# Patient Record
Sex: Male | Born: 1940 | Hispanic: Yes | Marital: Single | Smoking: Never smoker
Health system: Southern US, Community
[De-identification: ages and names within clinical notes are randomized; demographics above are authoritative.]

## PROBLEM LIST (undated history)

## (undated) DIAGNOSIS — N183 Chronic kidney disease, stage 3 unspecified: Secondary | ICD-10-CM

## (undated) DIAGNOSIS — E119 Type 2 diabetes mellitus without complications: Secondary | ICD-10-CM

## (undated) DIAGNOSIS — E785 Hyperlipidemia, unspecified: Secondary | ICD-10-CM

## (undated) DIAGNOSIS — I447 Left bundle-branch block, unspecified: Secondary | ICD-10-CM

## (undated) DIAGNOSIS — I10 Essential (primary) hypertension: Secondary | ICD-10-CM

## (undated) DIAGNOSIS — I639 Cerebral infarction, unspecified: Secondary | ICD-10-CM

## (undated) HISTORY — DX: Type 2 diabetes mellitus without complications: E11.9

## (undated) HISTORY — DX: Cerebral infarction, unspecified: I63.9

---

## 2016-04-25 ENCOUNTER — Emergency Department (HOSPITAL_COMMUNITY): Payer: Medicare Other

## 2016-04-25 ENCOUNTER — Emergency Department (HOSPITAL_COMMUNITY)
Admission: EM | Admit: 2016-04-25 | Discharge: 2016-04-25 | Disposition: A | Discharge status: Home / Self Care | Payer: Medicare Other | Attending: Emergency Medicine | Admitting: Emergency Medicine

## 2016-04-25 ENCOUNTER — Encounter (HOSPITAL_COMMUNITY): Payer: Self-pay | Admitting: *Deleted

## 2016-04-25 DIAGNOSIS — E1165 Type 2 diabetes mellitus with hyperglycemia: Secondary | ICD-10-CM | POA: Diagnosis not present

## 2016-04-25 DIAGNOSIS — R109 Unspecified abdominal pain: Secondary | ICD-10-CM | POA: Diagnosis not present

## 2016-04-25 DIAGNOSIS — R5383 Other fatigue: Secondary | ICD-10-CM | POA: Diagnosis present

## 2016-04-25 DIAGNOSIS — I10 Essential (primary) hypertension: Secondary | ICD-10-CM | POA: Diagnosis not present

## 2016-04-25 DIAGNOSIS — E119 Type 2 diabetes mellitus without complications: Secondary | ICD-10-CM

## 2016-04-25 DIAGNOSIS — E860 Dehydration: Secondary | ICD-10-CM | POA: Diagnosis not present

## 2016-04-25 HISTORY — DX: Essential (primary) hypertension: I10

## 2016-04-25 HISTORY — DX: Hyperlipidemia, unspecified: E78.5

## 2016-04-25 LAB — BASIC METABOLIC PANEL
Anion gap: 13 (ref 5–15)
BUN: 28 mg/dL — ABNORMAL HIGH (ref 6–20)
CO2: 25 mmol/L (ref 22–32)
Calcium: 9.6 mg/dL (ref 8.9–10.3)
Chloride: 91 mmol/L — ABNORMAL LOW (ref 101–111)
Creatinine, Ser: 1.47 mg/dL — ABNORMAL HIGH (ref 0.61–1.24)
GFR calc Af Amer: 52 mL/min — ABNORMAL LOW (ref >60)
GFR calc non Af Amer: 45 mL/min — ABNORMAL LOW (ref >60)
Glucose, Bld: 523 mg/dL — ABNORMAL HIGH (ref 65–99)
Potassium: 4.7 mmol/L (ref 3.5–5.1)
Sodium: 129 mmol/L — ABNORMAL LOW (ref 135–145)

## 2016-04-25 LAB — CBC
HCT: 44.9 % (ref 39.0–52.0)
Hemoglobin: 15.8 g/dL (ref 13.0–17.0)
MCH: 29.6 pg (ref 26.0–34.0)
MCHC: 35.2 g/dL (ref 30.0–36.0)
MCV: 84.2 fL (ref 78.0–100.0)
Platelets: 310 10*3/uL (ref 150–400)
RBC: 5.33 MIL/uL (ref 4.22–5.81)
RDW: 11.7 % (ref 11.5–15.5)
WBC: 9.8 10*3/uL (ref 4.0–10.5)

## 2016-04-25 LAB — I-STAT TROPONIN, ED: Troponin i, poc: 0 ng/mL (ref 0.00–0.08)

## 2016-04-25 LAB — CBG MONITORING, ED
Glucose-Capillary: 409 mg/dL — ABNORMAL HIGH (ref 65–99)
Glucose-Capillary: 295 mg/dL — ABNORMAL HIGH (ref 65–99)

## 2016-04-25 LAB — LIPASE, BLOOD: Lipase: 43 U/L (ref 11–51)

## 2016-04-25 MED ORDER — METFORMIN HCL 1000 MG PO TABS: 1000.0000 mg | ORAL_TABLET | Freq: Two times a day (BID) | ORAL | Status: DC

## 2016-04-25 MED ORDER — INSULIN ASPART 100 UNIT/ML ~~LOC~~ SOLN
5.0000 [IU] | Freq: Once | SUBCUTANEOUS | Status: AC
  Administered 2016-04-25: 5 [IU] via INTRAVENOUS
  Filled 2016-04-25: qty 1

## 2016-04-25 MED ORDER — HYDROMORPHONE HCL 1 MG/ML IJ SOLN
0.5000 mg | Freq: Once | INTRAMUSCULAR | Status: AC
  Administered 2016-04-25: 0.5 mg via INTRAVENOUS
  Filled 2016-04-25: qty 1

## 2016-04-25 MED ORDER — SODIUM CHLORIDE 0.9 % IV BOLUS (SEPSIS)
2000.0000 mL | Freq: Once | INTRAVENOUS | Status: AC
  Administered 2016-04-25: 2000 mL via INTRAVENOUS

## 2016-04-25 NOTE — ED Notes (Signed)
Patient and family all verbalized understanding of discharge instructions and deny any further needs or questions at this time. VS stable, patient ambulatory with steady gait. Patient's daughter states she will get prescription filled in the morning and follow up with the [facility] for PCP. Patient assisted to ED entrance in wheelchair.

## 2016-04-25 NOTE — ED Notes (Signed)
CBG 295 

## 2016-04-25 NOTE — Discharge Instructions (Signed)
Diabetes y normas bsicas de atencin mdica (Diabetes and Standards of Medical Care) La diabetes es una enfermedad complicada. El equipo que trate su diabetes deber incluir un nutricionista, un enfermero, un educador para la diabetes, un oftalmlogo y ms. Para que todas las personas conozcan sobre su enfermedad y para que los pacientes tengan los cuidados que necesitan, se crearon las siguientes normas bsicas para un mejor control. A continuacin se indican los estudios, vacunas, medicamentos, educacin y planes que necesitar. Prueba de HbA1c Esta prueba muestra cmo ha sido controlada su glucosa en los ltimos 2 o 3 meses. Se utiliza para verificar si el plan de control de la diabetes debe ser ajustado.   Hgalos al menos 2 veces al ao si cumple los objetivos del [place].  Si le han cambiado el tratamiento o si no cumple con los objetivos del [place], debe hacerlo 4 veces al ao. Control de la presin arterial.  Hgalas en cada visita mdica de rutina. El objetivo es tener menos de 140/90 mm Hg en la mayora de las personas, pero 130/80 mm Hg en algunos casos. Consulte a su mdico acerca de su objetivo. Examen dental.  Concurra regularmente a las visitas de control con el dentista. Examen ocular.  Si le diagnosticaron diabetes tipo 1 siendo un nio, debe hacerse estudios al llegar a los 10 aos o ms y si ha sufrido de diabetes durante 3 a 5 aos. Se recomienda hacer anualmente los exmenes oculares despus de ese examen inicial.  Si le diagnosticaron diabetes tipo 1 siendo adulto, hgase un examen dentro de los 5 aos del diagnstico y luego una vez por ao.  Si le diagnosticaron diabetes tipo 2, hgase un estudio lo antes posible despus del diagnstico y luego una vez por ao. Examen de los pies  Se har una inspeccin visual en cada visita mdica de rutina. Estos controles observarn si hay cortes, lesiones u otros problemas en los pies.  Debe realizarse un examen  completo de los pies cada ao. Esto incluye revisar la estructura y la piel de los pies, y examinar los pulsos y la sensacin de los pies.  Diabetes tipo 1: La primera prueba se realiza 5 aos despus del diagnstico.  Diabetes tipo 2: La primera prueba se realiza en el momento del diagnstico.  Contrlese los pies todas las noches para ver si hay cortes, lesiones u otros problemas. Comunquese con su mdico si observa que no se curan. Estudio de la funcin renal (microalbmina en orina)  Debe realizarse una vez por ao.  Diabetes tipo 1: La primera prueba se realiza a los 5 aos despus del diagnstico.  Diabetes tipo2: La primera prueba se realiza en el momento del diagnstico.  La creatinina srica y el ndice de filtracin glomerular estimada (eGFR, por sus siglas en ingls) se realizan una vez por ao para informar el nivel de enfermedad renal crnica, si la hubiera. Perfil lipdico (colesterol, HDL, LDL, triglicridos).  La mayora de las personas lo hacen cada 5 aos.  En relacin al LDL, el objetivo es tener menos de 100 mg/dl. Si tiene alto riesgo, el objetivo es tener menos de 70 mg/dl.  En relacin al HDL, el objetivo es [employer] 40 y 25 mg/dl para los hombres y entre 86 y 57 mg/dl para las mujeres. Un nivel de colesterol HDL de 60 mg/dl o superior da una cierta proteccin contra la enfermedad cardaca.  En relacin a los triglicridos, el objetivo es tener menos de 150 mg/dl. Vacunas  Se recomienda  aplicar de forma anual la vacuna contra la gripe a todas las personas de 6 meses en adelante que tengan diabetes.  La vacuna contra la neumona (antineumoccica) est recomendada para todas las personas de 2 aos en adelante que tengan diabetes. Los adultos de 65 aos o ms pueden recibir la vacuna antineumoccica como una serie de dos inyecciones diferentes.  Se recomienda administrar la vacuna contra la hepatitis B en adultos poco despus de que hayan recibido el  diagnstico de diabetes.  La vacuna Tdap (contra el ttanos, la difteria y la tosferina) debe aplicarse de la siguiente manera:  Segn las pautas normales de vacunacin infantil en el caso de los nios.  Cada 10 aos en el caso de los adultos con diabetes. Educacin para el autocontrol de la diabetes  Recomendaciones al momento del diagnstico y los controles segn sea necesario. Plan de tratamiento  Su plan de tratamiento ser revisado en cada visita mdica.   Esta informacin no tiene Marine scientist el consejo del mdico. Asegrese de hacerle al mdico cualquier pregunta que tenga.   Document Released: 02/19/2010 Document Revised: 12/16/2014 Elsevier Interactive Patient Education 2016 [place] (Hyperglycemia) La hiperglucemia ocurre cuando la glucosa (azcar) en su sangre est demasiado elevada. Puede suceder por varias razones, pero a menudo ocurre en personas que no saben que tienen diabetes o no la controlan adecuadamente.  CAUSAS Tanto si tiene diabetes como si no, existen otras causas para la hiperglucemia. La hiperglucemia puede producirse cuando tiene diabetes, pero tambin puede presentarse en otras situaciones de las que podra no ser consciente, como por ejemplo: Diabetes  Si tiene diabetes y tiene problemas para controlar su glucosa en sangre, la hiperglucemia podra producirse debido a las siguientes razones:  No seguir Armed forces technical officer.  No tomar los medicamentos para la diabetes o tomarlos de forma inadecuada.  Realizar menos ejercicio del que normalmente hace.  Estar enfermo. Prediabetes  Esto no puede ignorarse. Antes de que la persona presente diabetes de tipo 2, casi siempre hay "prediabetes". Esto ocurre cuando su glucosa en sangre es mayor que lo normal, pero no lo suficiente como para diagnosticar diabetes. La investigacin ha demostrado que algunos daos al cuerpo de Barrister's clerk, en especial los del corazn y el sistema  circulatorio, podran haber ocurrido durante el periodo de prediabetes. Si controla la glucosa en sangre cuando tiene prediabetes, podr retardar o evitar que se desarrrolle la diabetes tipo 2. El estrs  Si tiene diabetes, deber hacer una dieta, tomar medicamentos orales o insulina para [place]. Sin embargo, Pension scheme manager que la glucosa en sangre es mayor que lo normal en el hospital tenga o no diabetes. Cientficamente se lo denomina "hiperglucemia por estrs". El estrs puede elevar su glucosa en sangre. Esto ocurre porque el organismo genera hormonas en los momentos de estrs. Si el estrs ha [employer] causa del alto nivel de glucosa en [place], [place] mdico podr Optometrist un seguimiento de [place] regular. Ninfa [place], podr asegurarse de que la hiperglucemia no empeora o progresa hacia diabetes. Esteroides  Los esteroides son medicamentos que actan en la infeccin que ataca al sistema inmunolgico para bloquear la inflamacin o la infeccin. Un efecto secundario puede ser el aumento de glucosa en [place]. Muchas personas pueden producir la suficiente insulina extra para este aumento, pero aquellos que no pueden, los esteroides pueden [employer] niveles sean an [place]. No es inusual que los tratamientos con esteorides "destapen" una diabetes que se est desarrollando. No siempre es posible  determinar si la hiperglucemia desaparecer una vez que se detenga el consumo de esteroides. A veces se realiza un anlisis de sangre especial denominado A1c para determinar si la glucosa en sangre se ha elevado antes de comenzar con el consumo de esteroides. SNTOMAS  Sed.  Necesidad frecuente de Garment/textile technologist.  [employer].  Visin borrosa.  Cansancio o fatiga.  Debilidad.  Somnolencia.  Hormigueo en el pie o pierna. DIAGNSTICO El diagnstico se realiza mediante el control de la glucosa en sangre de una o varias de las siguientes maneras:  Anlisis A1c. Es una sustancia qumica que se encuentra en la  [place].  Control de glucosa en sangre con tiras de prueba.  Resultados de laboratorio. TRATAMIENTO Primero, es importante conocer la causa de la hiperglucemia antes de tratarla. El tratamiento puede ser el siguiente, [place] pueden ser otros:  Educacin  Cambios o ajustes en los medicamentos.  Cambios o ajustes en el plan de alimentacin.  Tratamiento por enfermedades, infecciones, etc.  Control de glucosa en sangre ms frecuente.  Cambios en el plan de ejercicios.  Disminucin o interrupcin del consumo de esteroides.  Cambios en el estilo de vida. INSTRUCCIONES PARA EL CUIDADO DOMICILIARIO  Contrlese la glucosa en sangre, como se lo indicaron.  Haga ejercicios regularmente. El profesional que lo asiste le dar instrucciones relacionadas con el ejercicio fsico. La prediabetes que es consecuencia de situaciones de estrs, puede mejorar con la actividad fsica.  Consuma alimentos saludables y balanceados. Coma a menudo y de [place] regular, en momentos fijos. El profesional o el nutricionista le dar una dieta especial para controlar su ingestin de azcar.  Mantener su peso ideal es importante. Si lo necesita, perder un poco de peso, como 5  7 Kg. puede ser beneficioso para [employer] niveles de Museum/gallery exhibitions officer. SOLICITE ATENCIN MDICA SI:  Tiene preguntas relacionadas con los medicamentos, la actividad o la dieta.  Contina teniendo sntomas (como mucha sed, deseos intensos de Garment/textile technologist o aumento de peso) SOLICITE ATENCIN MDICA DE INMEDIATO SI:  Vomita o tiene diarrea.  Su respiracin huele frutal.  La frecuencia respiratoria es ms rpida o ms lenta.  Est somnoliento o incoherente.  Siente adormecimiento, hormigueos o Engineer, agricultural o en las manos.  Siente dolor en el pecho.  Sus sntomas empeoran aunque haya seguido las indicaciones de su mdico.  Tiene otras preguntas o preocupaciones.   Esta informacin no tiene Marine scientist el consejo del  mdico. Asegrese de hacerle al mdico cualquier pregunta que tenga.   Document Released: 11/25/2005 Document Revised: 02/17/2012 Elsevier Interactive Patient Education 2016 [place] evitar los problemas relacionados con la diabetes (How to Avoid Diabetes Problems) Usted puede hacer varias cosas para prevenir o disminuir los problemas relacionados con la diabetes. Seguir un plan para la diabetes y cuidarse usted mismo puede reducir el riesgo de complicaciones graves o potencialmente mortales. A continuacin, encontrar algunas cosas que puede hacer para prevenir los problemas de la diabetes. CONTROLE LA DIABETES Siga las instrucciones de su mdico, enfermera educadora en diabetes y nutricionista para Aeronautical engineer enfermedad. Le ensearn los fundamentos para el cuidado de la diabetes. Le ayudar con las preguntas que pueda tener. Aprenda acerca de la diabetes y a tomar decisiones saludables en materia de alimentacin y [place] fsica. Controle su nivel de glucosa en la sangre con regularidad. El Biomedical engineer a decidir con qu frecuencia debe revisar su nivel de glucosa en la sangre, en funcin de los objetivos de su tratamiento y  el xito en cumplirlos.  NO USE NICOTINA La nicotina y la diabetes son Ardelia Mems combinacin peligrosa. La nicotina aumenta el riesgo de problemas con la diabetes. Si deja de Audiological scientist, reducir el riesgo de infarto de miocardio, ictus, enfermedades del sistema nervioso y enfermedades renales. Pueden mejorar el colesterol y sus niveles de presin arterial. La circulacin sangunea mejorar tambin. No consuma ningn producto que contenga tabaco, incluidos cigarrillos, tabaco de Higher education careers adviser o cigarrillos electrnicos. Si necesita ayuda para dejar de fumar, hable con el mdico. MANTENGA SU PRESIN ARTERIAL BAJO CONTROL El mdico determinar cul debera ser su presin arterial en funcin de su edad, los medicamentos que [place], el tiempo que hace que tiene diabetes y  cualquier otra enfermedad que padezca. La presin arterial consiste en dos nmeros. En general, el objetivo es mantener el nmero de [place] (presin sistlica) en un valor mximo de 130 y el nmero de abajo (presin diastlica) en un valor mximo de 80. Si corresponde, el mdico recomendar un objetivo de presin arterial con valores ms bajos. La planificacin de comidas, los medicamentos y el ejercicio pueden ayudarle a Science writer sus objetivos. Asegrese de que el mdico le mida la presin arterial en cada visita. MANTENGA LOS NIVELES DE COLESTEROL BAJO CONTROL Los niveles normales de colesterol ayudan a prevenir enfermedades del corazn y el ictus. Estos son los [employer] de salud para las personas con diabetes. Mantener los niveles de colesterol bajo control tambin puede ayudar con el flujo sanguneo. Controle su nivel de colesterol por lo menos una vez al ao. Su mdico puede recetarle un medicamento llamado estatina. Las estatinas reducen Freight forwarder. Si no est tomando una estatina, pregntele a su mdico si debera tomarla. La planificacin de comidas, el ejercicio y los medicamentos pueden ayudar a Science writer sus objetivos relacionados con el nivel de colesterol.  Steen Y OCULARES ANUALES El mdico le dir la frecuencia con la que quiere controlarlo en funcin de su plan de tratamiento. Es importante que cumpla con estos controles para identificar rpidamente posibles problemas y puedan evitarse o tratarse las complicaciones.  En cada visita, su mdico debe pesarlo, medirle la presin arterial y Chief of Staff su control del nivel de glucosa.  La hemoglobina A1c debe controlarse:  Por lo [employer] al ao si usted est en el nivel adecuado.  Cada 3 meses si hay cambios en el tratamiento.  Si usted no est alcanzando sus objetivos.  Los lpidos de la sangre deben controlarse anualmente. Tambin hay que controlar anualmente la presencia de protenas  en la orina (microalbuminuria).  Si tiene diabetes tipo1, programe un examen de fondo de ojos en el perodo de 46aos a partir del diagnstico y despus una vez al ao. Si tiene diabetes tipo2, programe un examen de fondo de ojos cuando reciba el diagnstico y despus una vez al ao. Los exmenes posteriores deben hacerse cada 2 o 3 aos si uno o ms exmenes han sido normales. MANTNGASE AL DA CON LAS VACUNAS Se recomienda que se vacune contra la gripe todos los [place]. Adems, que se vacune contra la neumona (vacuna antineumoccica). Si es mayor de 68 aos y nunca se Control and instrumentation engineer la neumona, esta vacuna puede administrarse como una serie de dos vacunas por separado. Pregntele al mdico qu otras vacunas se pueden recomendar. CUIDE SUS PIES  La diabetes puede hacer que el flujo sanguneo (circulacin) en las piernas y los pies sea deficiente. Debido a esto, la piel se torna ms delgada, se rompe  con facilidad y se cura ms lentamente. Tambin puede sufrir un dao en los nervios de las piernas y los pies, lo que disminuye la sensibilidad. Es posible que no advierta las heridas ms pequeas que pueden conducir a infecciones graves. El cuidado de los pies es muy importante. Se har una inspeccin visual en cada visita mdica de rutina. Estos controles observarn si hay cortes, lesiones u otros problemas en los pies. Una vez por ao debe hacerse un examen ms intensivo. Este examen incluye la inspeccin visual y Ardelia Mems evaluacin de los pulsos del pie y la sensibilidad. Usted tambin debe hacer lo siguiente:  Examine sus pies todos los [place] para detectar cortes, ampollas, callos, uas encarnadas, y signos de infeccin, tales como enrojecimiento, hinchazn o pus.  Lave y seque bien los pies, [employer] dedos.  Evite sumergir sus pies regularmente en agua caliente.  Hidrate la piel seca con locin, evitando colocarla [employer] dedos.  Crtese las uas en lnea recta y lime los  bordes.  Evite los zapatos que no calzan bien o tienen reas que irritan la piel.  Evite ir descalzo o con calcetines solamente. Sus pies necesitan proteccin. [place] con diabetes mal controlada son ms propensas a Music therapist (periodontales). Estas infecciones hacen que la diabetes sea difcil de Chief Technology Officer. Las [employer], si se dejan sin tratamiento, pueden conducir a la prdida de dientes. [place] sus [employer] al da, use hilo dental y visite al dentista para los controles y limpieza cada 6 meses o 2 veces al ao. CONSULTE A SU MDICO SOBRE EL CONSUMO DE ASPIRINA Tomar aspirina a diario se recomienda para ayudar a prevenir la enfermedad cardiovascular en personas con y sin diabetes. Pregntele a su mdico si esto lo beneficiara y cul es la dosis [employer] recomienda. [place] cantidades moderadas de alcohol (menos de 1 bebida al da para mujeres adultas y menores de 2 bebidas al da para hombres adultos) tienen un mnimo efecto sobre la glucosa en la sangre si se ingiere con los alimentos. Es importante comer alimentos cuando se bebe alcohol para evitar la hipoglucemia. Las Personal assistant el alcohol si tienen un historial de consumo excesivo o dependencia, si es una mujer [place], y si tiene una enfermedad heptica, pancreatitis, neuropata avanzada, o hipertrigliceridemia grave. DISMINUYA EL NIVEL DE ESTRS Vivir con diabetes puede ser estresante. Cuando usted est bajo estrs, el nivel de glucosa en la sangre puede verse afectada de dos maneras:  Las hormonas del estrs pueden hacer que la glucosa en la sangre se eleve.  Probablemente no se cuid lo suficiente. Es Ardelia Mems buena idea estar al tanto del nivel de estrs y Field seismologist los cambios que sean necesarios para ayudar a Air cabin crew mejor las situaciones difciles. Los grupos de apoyo, Clinical biochemist planificada, un pasatiempo que le guste, la  [place], las relaciones saludables y Field seismologist ejercicio son factores que ayudan a reducir el nivel de Psychologist, forensic. Si sus esfuerzos no parecen [employer], pdale ayuda a su mdico o a Civil Service fast streamer de la salud mental capacitado.   Esta informacin no tiene Marine scientist el consejo del mdico. Asegrese de hacerle al mdico cualquier pregunta que tenga.   Document Released: 11/14/2011 Document Revised: 12/16/2014 Elsevier Interactive Patient Education [employer].

## 2016-04-25 NOTE — ED Notes (Signed)
Checked patient blood sugar it was 409 notified RN of blood sugar

## 2016-04-25 NOTE — ED Provider Notes (Signed)
CSN: [id number]     Arrival date & time 04/25/16  1555 History   First MD Initiated Contact with Patient 04/25/16 1751     Chief Complaint  Patient presents with  . Chest Pain     (Consider location/radiation/quality/duration/timing/severity/associated sxs/prior Treatment) HPI   Language barrier. Interpretor used. 75yM with many complaints. For past several weeks to months has had increasing fatigue. Has no energy. Mild vague abdominal pain. Mild nausea. No vomiting. Polyuria. Polydipsia. Blurred vision. Significant hyperglycemia noted. No previously diagnosed hx of diabetes.   Past Medical History  Diagnosis Date  . Hypertension   . Hyperlipemia    History reviewed. No pertinent past surgical history. No family history on file. Social History  Substance Use Topics  . Smoking status: Never Smoker   . Smokeless tobacco: None  . Alcohol Use: None    Review of Systems  All systems reviewed and negative, other than as noted in HPI.    Allergies  Review of patient's allergies indicates no known allergies.  Home Medications   Prior to Admission medications   Not on File   BP 99/75 mmHg  Pulse 84  Temp(Src) 98.2 F (36.8 C) (Oral)  Resp 16  SpO2 92% Physical Exam  Constitutional: He appears well-developed and well-nourished. No distress.  HENT:  Head: Normocephalic and atraumatic.  Eyes: Conjunctivae are normal. Right eye exhibits no discharge. Left eye exhibits no discharge.  Neck: Neck supple.  Cardiovascular: Normal rate, regular rhythm and normal heart sounds.  Exam reveals no gallop and no friction rub.   No murmur heard. Pulmonary/Chest: Effort normal and breath sounds normal. No respiratory distress.  Abdominal: Soft. He exhibits no distension. There is no tenderness.  Musculoskeletal: He exhibits no edema or tenderness.  Neurological: He is alert.  Skin: Skin is warm and dry.  Psychiatric: He has a normal mood and affect. His behavior is normal. Thought  content normal.  Nursing note and vitals reviewed.   ED Course  Procedures (including critical care time) Labs Review Labs Reviewed  BASIC METABOLIC PANEL - Abnormal; Notable for the following:    Sodium 129 (*)    Chloride 91 (*)    Glucose, Bld 523 (*)    BUN 28 (*)    Creatinine, Ser 1.47 (*)    GFR calc non Af Amer 45 (*)    GFR calc Af Amer 52 (*)    All other components within normal limits  CBG MONITORING, ED - Abnormal; Notable for the following:    Glucose-Capillary 409 (*)    All other components within normal limits  CBC  LIPASE, BLOOD  I-STAT TROPOININ, ED    Imaging Review Dg Chest 2 View  04/25/2016  CLINICAL DATA:  74 year old male with chest pain abdominal pain headache shortness of breath nausea and diarrhea for 4 days. Initial encounter. EXAM: CHEST  2 VIEW COMPARISON:  None. FINDINGS: No pneumothorax or pneumoperitoneum. Mild cardiomegaly. Mildly tortuous thoracic aorta with calcified atherosclerosis. Other mediastinal contours are within normal limits. No pulmonary edema, pleural effusion or confluent pulmonary opacity. Normal visible bowel gas pattern in the upper abdomen. No acute osseous abnormality identified. IMPRESSION: Mild cardiomegaly. Calcified aortic atherosclerosis. No acute cardiopulmonary abnormality. Electronically Signed   By: Genevie Ann M.D.   On: 04/25/2016 17:17   I have personally reviewed and evaluated these images and lab results as part of my medical decision-making.   EKG Interpretation   Date/Time:  Thursday Apr 25 2016 16:01:39 EDT Ventricular Rate:  98  PR Interval:  146 QRS Duration: 84 QT Interval:  310 QTC Calculation: 395 R Axis:   16 Text Interpretation:  Normal sinus rhythm Right atrial enlargement T wave  abnormality, consider inferolateral ischemia No old tracing to compare  Confirmed by Reasnor  MD, [place] ([id number]) on 04/25/2016 6:10:54 PM      MDM   Final diagnoses:  Diabetes mellitus, new onset ([place])  Dehydration     75 year old male with multiple symptoms. Many likely related to hyperglycemia. Several week history of generalized fatigue. Intermittent nausea. Abdominal pain. Intermittent blurred vision. Polyuria. Polydipsia. He is hyperglycemic without acidosis or increased anion gap. Some renal impairment which is likely prerenal given his elevated BUN. He was given 2 L of IV fluids and a small dose of insulin. His glucose is in a more reasonable range. He will be discharged with a prescription for metformin. Briefly discussed diet and exercise. He needs to follow-up his primary physician as soon as he can. Return precautions discussed.  Virgel Manifold, MD 05/03/16 [phone]

## 2016-04-25 NOTE — ED Notes (Addendum)
Pt reports chest pain, nausea and diarrhea for 4 days. Pt was seen at an urgent care and had bloodwork drawn. Pt was called today and told to come to the emergency department. NAD noted in triage.

## 2016-04-26 NOTE — ED Notes (Signed)
0.5mg  of dilaudid wasted in [place] with Chaney Malling RN

## 2016-04-26 NOTE — ED Notes (Signed)
0.5mg  dilaudid wasted with Norva Pavlov RN in [place].

## 2019-12-19 ENCOUNTER — Emergency Department (HOSPITAL_COMMUNITY): Payer: Medicare Other

## 2019-12-19 ENCOUNTER — Encounter (HOSPITAL_COMMUNITY): Payer: Self-pay | Admitting: *Deleted

## 2019-12-19 ENCOUNTER — Other Ambulatory Visit: Payer: Self-pay

## 2019-12-19 ENCOUNTER — Inpatient Hospital Stay (HOSPITAL_COMMUNITY)
Admission: EM | Admit: 2019-12-19 | Discharge: 2019-12-22 | DRG: 065 | Disposition: A | Discharge status: Rehabilitation Facility | Payer: Medicare Other | Attending: Internal Medicine | Admitting: Internal Medicine

## 2019-12-19 DIAGNOSIS — N1831 Chronic kidney disease, stage 3a: Secondary | ICD-10-CM | POA: Diagnosis present

## 2019-12-19 DIAGNOSIS — K3184 Gastroparesis: Secondary | ICD-10-CM | POA: Diagnosis not present

## 2019-12-19 DIAGNOSIS — K760 Fatty (change of) liver, not elsewhere classified: Secondary | ICD-10-CM | POA: Diagnosis present

## 2019-12-19 DIAGNOSIS — R531 Weakness: Secondary | ICD-10-CM | POA: Diagnosis present

## 2019-12-19 DIAGNOSIS — I129 Hypertensive chronic kidney disease with stage 1 through stage 4 chronic kidney disease, or unspecified chronic kidney disease: Secondary | ICD-10-CM | POA: Diagnosis not present

## 2019-12-19 DIAGNOSIS — G9349 Other encephalopathy: Secondary | ICD-10-CM | POA: Diagnosis present

## 2019-12-19 DIAGNOSIS — E1165 Type 2 diabetes mellitus with hyperglycemia: Secondary | ICD-10-CM | POA: Diagnosis not present

## 2019-12-19 DIAGNOSIS — E785 Hyperlipidemia, unspecified: Secondary | ICD-10-CM | POA: Diagnosis present

## 2019-12-19 DIAGNOSIS — G8191 Hemiplegia, unspecified affecting right dominant side: Secondary | ICD-10-CM | POA: Diagnosis not present

## 2019-12-19 DIAGNOSIS — Z7984 Long term (current) use of oral hypoglycemic drugs: Secondary | ICD-10-CM

## 2019-12-19 DIAGNOSIS — Z23 Encounter for immunization: Secondary | ICD-10-CM

## 2019-12-19 DIAGNOSIS — Z8249 Family history of ischemic heart disease and other diseases of the circulatory system: Secondary | ICD-10-CM

## 2019-12-19 DIAGNOSIS — E1143 Type 2 diabetes mellitus with diabetic autonomic (poly)neuropathy: Secondary | ICD-10-CM | POA: Diagnosis present

## 2019-12-19 DIAGNOSIS — Z20822 Contact with and (suspected) exposure to covid-19: Secondary | ICD-10-CM | POA: Diagnosis not present

## 2019-12-19 DIAGNOSIS — I639 Cerebral infarction, unspecified: Secondary | ICD-10-CM | POA: Diagnosis not present

## 2019-12-19 DIAGNOSIS — R29703 NIHSS score 3: Secondary | ICD-10-CM | POA: Diagnosis not present

## 2019-12-19 DIAGNOSIS — Z792 Long term (current) use of antibiotics: Secondary | ICD-10-CM | POA: Diagnosis not present

## 2019-12-19 DIAGNOSIS — I447 Left bundle-branch block, unspecified: Secondary | ICD-10-CM | POA: Diagnosis not present

## 2019-12-19 DIAGNOSIS — R471 Dysarthria and anarthria: Secondary | ICD-10-CM | POA: Diagnosis present

## 2019-12-19 DIAGNOSIS — I10 Essential (primary) hypertension: Secondary | ICD-10-CM

## 2019-12-19 DIAGNOSIS — E1122 Type 2 diabetes mellitus with diabetic chronic kidney disease: Secondary | ICD-10-CM | POA: Diagnosis present

## 2019-12-19 DIAGNOSIS — E782 Mixed hyperlipidemia: Secondary | ICD-10-CM

## 2019-12-19 DIAGNOSIS — R29810 Facial weakness: Secondary | ICD-10-CM | POA: Diagnosis present

## 2019-12-19 DIAGNOSIS — Z79899 Other long term (current) drug therapy: Secondary | ICD-10-CM | POA: Diagnosis not present

## 2019-12-19 LAB — COMPREHENSIVE METABOLIC PANEL
ALT: 69 U/L — ABNORMAL HIGH (ref 0–44)
AST: 92 U/L — ABNORMAL HIGH (ref 15–41)
Albumin: 4.1 g/dL (ref 3.5–5.0)
Alkaline Phosphatase: 59 U/L (ref 38–126)
Anion gap: 15 (ref 5–15)
BUN: 26 mg/dL — ABNORMAL HIGH (ref 8–23)
CO2: 24 mmol/L (ref 22–32)
Calcium: 9.5 mg/dL (ref 8.9–10.3)
Chloride: 97 mmol/L — ABNORMAL LOW (ref 98–111)
Creatinine, Ser: 1.37 mg/dL — ABNORMAL HIGH (ref 0.61–1.24)
GFR calc Af Amer: 57 mL/min — ABNORMAL LOW (ref >60)
GFR calc non Af Amer: 49 mL/min — ABNORMAL LOW (ref >60)
Glucose, Bld: 158 mg/dL — ABNORMAL HIGH (ref 70–99)
Potassium: 3.5 mmol/L (ref 3.5–5.1)
Sodium: 136 mmol/L (ref 135–145)
Total Bilirubin: 0.8 mg/dL (ref 0.3–1.2)
Total Protein: 7.2 g/dL (ref 6.5–8.1)

## 2019-12-19 LAB — CBC WITH DIFFERENTIAL/PLATELET
Abs Immature Granulocytes: 0.06 10*3/uL (ref 0.00–0.07)
Basophils Absolute: 0.1 10*3/uL (ref 0.0–0.1)
Basophils Relative: 1 %
Eosinophils Absolute: 0.2 10*3/uL (ref 0.0–0.5)
Eosinophils Relative: 2 %
HCT: 44.9 % (ref 39.0–52.0)
Hemoglobin: 15.3 g/dL (ref 13.0–17.0)
Immature Granulocytes: 1 %
Lymphocytes Relative: 25 %
Lymphs Abs: 2.3 10*3/uL (ref 0.7–4.0)
MCH: 30.4 pg (ref 26.0–34.0)
MCHC: 34.1 g/dL (ref 30.0–36.0)
MCV: 89.1 fL (ref 80.0–100.0)
Monocytes Absolute: 0.7 10*3/uL (ref 0.1–1.0)
Monocytes Relative: 7 %
Neutro Abs: 6 10*3/uL (ref 1.7–7.7)
Neutrophils Relative %: 64 %
Platelets: 309 10*3/uL (ref 150–400)
RBC: 5.04 MIL/uL (ref 4.22–5.81)
RDW: 13 % (ref 11.5–15.5)
WBC: 9.3 10*3/uL (ref 4.0–10.5)
nRBC: 0 % (ref 0.0–0.2)

## 2019-12-19 LAB — TROPONIN I (HIGH SENSITIVITY)
Troponin I (High Sensitivity): 6 ng/L (ref <18)
Troponin I (High Sensitivity): 8 ng/L (ref <18)

## 2019-12-19 LAB — RESPIRATORY PANEL BY RT PCR (FLU A&B, COVID)
Influenza A by PCR: NEGATIVE
Influenza B by PCR: NEGATIVE
SARS Coronavirus 2 by RT PCR: NEGATIVE

## 2019-12-19 LAB — GLUCOSE, CAPILLARY: Glucose-Capillary: 137 mg/dL — ABNORMAL HIGH (ref 70–99)

## 2019-12-19 MED ORDER — SODIUM CHLORIDE 0.9 % IV SOLN: INTRAVENOUS | Status: DC

## 2019-12-19 MED ORDER — ASPIRIN EC 81 MG PO TBEC
81.0000 mg | DELAYED_RELEASE_TABLET | Freq: Every day | ORAL | Status: DC
  Administered 2019-12-20: 12:00:00 81 mg via ORAL
  Filled 2019-12-19: qty 1

## 2019-12-19 MED ORDER — ONDANSETRON HCL 4 MG PO TABS: 4.0000 mg | ORAL_TABLET | Freq: Four times a day (QID) | ORAL | Status: DC | PRN

## 2019-12-19 MED ORDER — ASPIRIN 81 MG PO CHEW
324.0000 mg | CHEWABLE_TABLET | Freq: Once | ORAL | Status: AC
  Administered 2019-12-19: 19:00:00 324 mg via ORAL
  Filled 2019-12-19: qty 4

## 2019-12-19 MED ORDER — POLYETHYLENE GLYCOL 3350 17 G PO PACK: 17.0000 g | PACK | Freq: Every day | ORAL | Status: DC | PRN

## 2019-12-19 MED ORDER — INSULIN ASPART 100 UNIT/ML ~~LOC~~ SOLN
0.0000 [IU] | Freq: Every day | SUBCUTANEOUS | Status: DC
  Administered 2019-12-21: 3 [IU] via SUBCUTANEOUS

## 2019-12-19 MED ORDER — IOHEXOL 9 MG/ML PO SOLN
ORAL | Status: AC
  Filled 2019-12-19: qty 1000

## 2019-12-19 MED ORDER — INSULIN ASPART 100 UNIT/ML ~~LOC~~ SOLN
0.0000 [IU] | Freq: Three times a day (TID) | SUBCUTANEOUS | Status: DC
  Administered 2019-12-20: 17:00:00 1 [IU] via SUBCUTANEOUS
  Administered 2019-12-20: 12:00:00 3 [IU] via SUBCUTANEOUS
  Administered 2019-12-20: 09:00:00 2 [IU] via SUBCUTANEOUS
  Administered 2019-12-21: 1 [IU] via SUBCUTANEOUS
  Administered 2019-12-21: 09:00:00 2 [IU] via SUBCUTANEOUS
  Administered 2019-12-21: 12:00:00 3 [IU] via SUBCUTANEOUS
  Administered 2019-12-22: 08:00:00 2 [IU] via SUBCUTANEOUS
  Administered 2019-12-22: 12:00:00 3 [IU] via SUBCUTANEOUS

## 2019-12-19 MED ORDER — ONDANSETRON HCL 4 MG/2ML IJ SOLN: 4.0000 mg | Freq: Four times a day (QID) | INTRAMUSCULAR | Status: DC | PRN

## 2019-12-19 MED ORDER — SODIUM CHLORIDE 0.9% FLUSH: 3.0000 mL | INTRAVENOUS | Status: DC | PRN

## 2019-12-19 MED ORDER — ACETAMINOPHEN 650 MG RE SUPP: 650.0000 mg | Freq: Four times a day (QID) | RECTAL | Status: DC | PRN

## 2019-12-19 MED ORDER — ATORVASTATIN CALCIUM 40 MG PO TABS
40.0000 mg | ORAL_TABLET | Freq: Every day | ORAL | Status: DC
  Administered 2019-12-19 – 2019-12-21 (×3): 40 mg via ORAL
  Filled 2019-12-19 (×4): qty 1

## 2019-12-19 MED ORDER — ACETAMINOPHEN 325 MG PO TABS
650.0000 mg | ORAL_TABLET | Freq: Four times a day (QID) | ORAL | Status: DC | PRN
  Administered 2019-12-19 – 2019-12-22 (×2): 650 mg via ORAL
  Filled 2019-12-19 (×2): qty 2

## 2019-12-19 MED ORDER — LABETALOL HCL 5 MG/ML IV SOLN: 10.0000 mg | INTRAVENOUS | Status: DC | PRN

## 2019-12-19 NOTE — Consult Note (Signed)
[facility] TeleNeurology Consult Services  Stat Consult  Date of Service:   12/19/2019 17:24:29  Impression:     .  R53.1 - Weakness  Comments/Sign-Out: 79yo Spanish speaking man w/PMH of DM, HLD p/w right arm/leg weakness on 12/19/19. Interpreter [phone]. He started having body aches, dizziness, nausea, weakness and trouble walking on 12/13/18. He also had right arm weakness since then. Dizziness and nausea have resolved but weakness remained so he came to the ED today. NIHSS 3 for right facial asymmetry, right leg drift, mild dysarthria. CT Head has no acute findings. Pt is not a candidate for alteplase or neuroIR due to being out of the 24 hour window. Presentation is concerning for acute ischemic stroke based on history and exam, stroke workup advised.  CT HEAD: Showed No Acute Hemorrhage or Acute Core Infarct  Metrics: [facility] Notification Time: 12/19/2019 17:22:26 Stamp Time: 12/19/2019 17:24:29 Callback Response Time: 12/19/2019 17:25:19  Our recommendations are outlined below.  Recommendations:     .  Initiate Aspirin 325 MG Daily     .  Telemetry     .  Normal Saline     .  SCDs for DVT prophylaxis     .  Optimize blood pressure, temp, glucose  Imaging Studies:     .  MRI Head Without Contrast     .  MRA Head and Neck Without Contrast When Available - Stroke Protocol     .  Carotid Dopplers     .  Echocardiogram - Transthoracic Echocardiogram  Therapies:     .  Physical Therapy, Occupational Therapy, Speech Therapy Assessment When Applicable  Other WorkUp:     .  Infectious/metabolic workup per primary team  Disposition: Neurology Follow Up Recommended  Sign Out:     .  Discussed with Emergency Department Provider  ----------------------------------------------------------------------------------------------------  Chief Complaint: right arm/leg weakness  History of Present Illness: Patient is a 79 year old Male.  79yo Spanish  speaking man w/PMH of DM, HLD p/w right arm/leg weakness on 12/19/19. Interpreter [phone]. He started having body aches, dizziness, nausea, weakness and trouble walking on 12/13/18. He also had right arm weakness since then. Dizziness and nausea have resolved but weakness remained so he came to the ED today.   Past Medical History:     . Diabetes Mellitus     . Hyperlipidemia     . There is NO history of Hypertension     . There is NO history of Atrial Fibrillation     . There is NO history of Coronary Artery Disease     . There is NO history of Stroke  Anticoagulant use:  No  Antiplatelet use: aspirin    Examination: BP(155/96), Pulse(100), Blood Glucose(158) 1A: Level of Consciousness - Alert; keenly responsive + 0 1B: Ask Month and Age - Both Questions Right + 0 1C: Blink Eyes & Squeeze Hands - Performs Both Tasks + 0 2: Test Horizontal Extraocular Movements - Normal + 0 3: Test Visual Fields - No Visual Loss + 0 4: Test Facial Palsy (Use Grimace if Obtunded) - Minor paralysis (flat nasolabial fold, smile asymmetry) + 1 5A: Test Left Arm Motor Drift - No Drift for 10 Seconds + 0 5B: Test Right Arm Motor Drift - No Drift for 10 Seconds + 0 6A: Test Left Leg Motor Drift - No Drift for 5 Seconds + 0 6B: Test Right Leg Motor Drift - Drift, but doesn't hit bed + 1 7: Test Limb Ataxia (FNF/Heel-Shin) -  No Ataxia + 0 8: Test Sensation - Normal; No sensory loss + 0 9: Test Language/Aphasia - Normal; No aphasia + 0 10: Test Dysarthria - Mild-Moderate Dysarthria: Slurring but can be understood + 1 11: Test Extinction/Inattention - No abnormality + 0  NIHSS Score: 3  Patient/Family was informed the Neurology Consult would happen via TeleHealth consult by way of interactive audio and video telecommunications and consented to receiving care in this manner.  Due to the immediate potential for life-threatening deterioration due to underlying acute neurologic illness, I spent 35 minutes  providing critical care. This time includes time for face to face visit via telemedicine, review of medical records, imaging studies and discussion of findings with providers, the patient and/or family.   Dr Lenard Galloway Classie Weng   [facility] [phone]   Case [id number]

## 2019-12-19 NOTE — H&P (Addendum)
History and Physical    Eugene Cooper [id number] DOB: 09-May-1941 DOA: 12/19/2019  PCP: Patient, No Pcp Per   Patient coming from: Home  I have personally briefly reviewed patient's old medical records in [place]  Chief Complaint: Right-sided weakness  HPI: Eugene Cooper is a 79 y.o. male with medical history significant for hypertension, dyslipidemia.  Patient is Spanish-speaking, history is obtained with the help of an tele- interpreter.  Patient presented to the ED with complaints of weakness with his right upper extremity and right lower extremity weakness with resultant inability to walk.  He also reports change in his voice, but is unable to confirm facial asymmetry.  He tells me symptoms started 2 to 3 days ago.he also reports he was having multiple episodes of vomiting that day that lasted just 1 day, and loose stools of 2 days duration.  Reported diffuse abdominal aches, generalized body aches, pain in his ankles also.   No specific complaints of chest pain, he is unable to tell me specifically if he has had any difficulty breathing.   After vomiting stopped, his abdominal pain has persisted to today, also today right-sided weakness hence he presented to the ED today. Denies current alcohol intake.  Denies Covid positive contacts.   ED Course: Blood pressure systolic Q000111Q to 123456, mild elevation in liver enzymes AST 92, ALT 69.  Head CT negative for acute intracranial abnormalities.  EKG showed sinus rhythm with LBBB.  Telemetry neurology was consulted-concern for acute ischemic stroke, full stroke work-up recommended.  Review of Systems: As per HPI all other systems reviewed and negative.  Past Medical History:  Diagnosis Date  . Hyperlipemia   . Hypertension     History reviewed. No pertinent surgical history.   reports that he has never smoked. He has never used smokeless tobacco. He reports previous alcohol use. He reports that he does not use  drugs.  No Known Allergies  Family history of hypertension.  Prior to Admission medications   Medication Sig Start Date End Date Taking? Authorizing Provider  lisinopril-hydrochlorothiazide (PRINZIDE,ZESTORETIC) 20-25 MG tablet Take 1 tablet by mouth daily. 03/10/16  Yes [provider]  metFORMIN (GLUCOPHAGE) 1000 MG tablet Take 1 tablet (1,000 mg total) by mouth 2 (two) times daily with a meal. 04/25/16  Yes Virgel Manifold, MD  pravastatin (PRAVACHOL) 40 MG tablet Take 40 mg by mouth at bedtime. 03/10/16  Yes [provider]  amoxicillin-clavulanate (AUGMENTIN) 875-125 MG tablet Take 1 tablet by mouth 2 (two) times daily. 04/19/16   [provider]    Physical Exam: Vitals:   12/19/19 1715 12/19/19 1730 12/19/19 1800 12/19/19 1830  BP:  (!) 154/96 138/87 (!) 138/97  Pulse:      Resp: 13     Temp:      SpO2:      Weight:      Height:        Constitutional: NAD, calm, comfortable Vitals:   12/19/19 1715 12/19/19 1730 12/19/19 1800 12/19/19 1830  BP:  (!) 154/96 138/87 (!) 138/97  Pulse:      Resp: 13     Temp:      SpO2:      Weight:      Height:       Eyes: PERRL, lids and conjunctivae normal ENMT: Mucous membranes are moist. Posterior pharynx clear of any exudate or lesions.  Neck: normal, supple, no masses, no thyromegaly Respiratory: clear to auscultation bilaterally, no wheezing, no crackles. Normal respiratory effort.  No accessory muscle use.  Cardiovascular: Regular rate and rhythm. No extremity edema. 2+ pedal pulses.  Abdomen: Moderate diffuse abdominal tenderness , no masses palpated. No hepatosplenomegaly. Bowel sounds positive.  Musculoskeletal: no clubbing / cyanosis. No joint deformity upper and lower extremities. Good ROM, no contractures. Normal muscle tone.  Skin: no rashes, lesions, ulcers. No induration Neurologic: Facial asymmetry present, with flattening of naso-labial fold on the right, right forehead not wrinkling, strength  right upper and lower extremity 4/5, left upper and lower extremity 5/5.  Sensation intact globally. Psychiatric: Normal judgment and insight. Alert and oriented x 3. Normal mood.   Labs on Admission: I have personally reviewed following labs and imaging studies  CBC: Recent Labs  Lab 12/19/19 1542  WBC 9.3  NEUTROABS 6.0  HGB 15.3  HCT 44.9  MCV 89.1  PLT Q000111Q   Basic Metabolic Panel: Recent Labs  Lab 12/19/19 1542  NA 136  K 3.5  CL 97*  CO2 24  GLUCOSE 158*  BUN 26*  CREATININE 1.37*  CALCIUM 9.5   Liver Function Tests: Recent Labs  Lab 12/19/19 1542  AST 92*  ALT 69*  ALKPHOS 59  BILITOT 0.8  PROT 7.2  ALBUMIN 4.1    Radiological Exams on Admission: CT Head Wo Contrast  Result Date: 12/19/2019 CLINICAL DATA:  Right-sided weakness EXAM: CT HEAD WITHOUT CONTRAST TECHNIQUE: Contiguous axial images were obtained from the base of the skull through the vertex without intravenous contrast. COMPARISON:  None. FINDINGS: Brain: There is no acute intracranial hemorrhage, mass-effect, or edema. Gray-white differentiation is preserved. There is no extra-axial fluid collection. Ventricles and sulci are within normal limits in size and configuration. Patchy hypoattenuation in the supratentorial white matter is nonspecific but may reflect mild chronic microvascular ischemic changes. Vascular: There is atherosclerotic calcification at the skull base. Skull: Calvarium is unremarkable. Sinuses/Orbits: Focal opacification of a left ethmoid air cell. Orbits are unremarkable. Other: Mastoid air cells are clear. IMPRESSION: No acute intracranial hemorrhage, mass effect, or evidence of acute infarction. Mild chronic microvascular ischemic changes. Electronically Signed   By: Macy Mis M.D.   On: 12/19/2019 16:09    EKG: Independently reviewed.  Sinus rhythm, QTC 493.  New left bundle branch block when compared to last EKG on file from 2017 EKG. no EKG in care  everywhere.  Assessment/Plan Active Problems:   Right sided weakness  Right-sided weakness- with right facial droop, reported dysarthria.  Head CT negative for acute abnormality.  History of hypertension.  Telemetry neurology consulted, concern for acute ischemic stroke.  Outside TPA window.  -MRI brain, MRA brain and neck -Aspirin 325 mg given, continue 81 mg daily -Atorvastatin 40 mg daily - Hgba1c and lipid panel in a.m. -Echocardiogram -Neurology consult -PT OT speech therapy evaluation - neurochecks  Abnormal EKG-ED showing new left bundle branch block, EKG 2017. No specific acute complaints of chest pain or difficulty breathing-so unsure of chronicity.  Hs Troponin 6.  - Trend Troponin -Repeat EKG in the morning. - Placed cardiology consult  -Follow-up echocardiogram - start aspirin, statins  Transaminitis- AST 92, ALT 69.  With normal ALP and total bilirubin.  History of alcohol abuse. -Acute hepatitis panel in a.m.  Abdominal pain-persistent with resolved vomiting and diarrhea.  Diffuse abdominal tenderness on exam, WBC 9.3. -Obtain CT abdomen and pelvis with contrast -With contrast exposure and considering CKD, will hydrate with N/s 100cc/hr x 10 hrs.  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 - Hydrate for contrast  exposure.  Hypertension-systolic Q000111Q to 123456. -Hold home lisinopril HCTZ for contrast exposure - PRN IV labetalol for systolic > 123XX123  Diabetes mellitus-random glucose 158 -Hold Metformin -Sliding scale insulin- S  Dyslipidemia - Lipid panel a.m - Atorv 40mg  daily  DVT prophylaxis: SCDs, pending size of stroke on MRI Code Status: Full Family Communication: None Disposition Plan: Per rounding team Consults called: neurology Admission status: Obs, Tele   Bethena Roys MD [facility]  12/19/2019, 9:29 PM

## 2019-12-19 NOTE — ED Provider Notes (Signed)
[facility] EMERGENCY DEPARTMENT Provider Note   CSN: [id number] Arrival date & time: 12/19/19  1424     History Chief Complaint  Patient presents with  . Weakness    right side weakness    F…Eugene Cooper is a 79 y.o. male.  Patient states that he threw up on Thursday and he has been having weakness in the right leg and right arm ever since then.  Patient has a history of hypertension hyperlipidemia  The history is provided by the patient. No language interpreter was used.  Weakness Severity:  Moderate Onset quality:  Sudden Timing:  Constant Progression:  Worsening Chronicity:  New Context: not alcohol use   Relieved by:  Nothing Worsened by:  Nothing Ineffective treatments:  None tried Associated symptoms: no abdominal pain, no chest pain, no cough, no diarrhea, no frequency, no headaches and no seizures        Past Medical History:  Diagnosis Date  . Hyperlipemia   . Hypertension     There are no problems to display for this patient.   History reviewed. No pertinent surgical history.     History reviewed. No pertinent family history.  Social History   Tobacco Use  . Smoking status: Never Smoker  . Smokeless tobacco: Never Used  Substance Use Topics  . Alcohol use: Not Currently  . Drug use: Never    Home Medications Prior to Admission medications   Medication Sig Start Date End Date Taking? Authorizing Provider  lisinopril-hydrochlorothiazide (PRINZIDE,ZESTORETIC) 20-25 MG tablet Take 1 tablet by mouth daily. 03/10/16  Yes [provider]  metFORMIN (GLUCOPHAGE) 1000 MG tablet Take 1 tablet (1,000 mg total) by mouth 2 (two) times daily with a meal. 04/25/16  Yes Virgel Manifold, MD  pravastatin (PRAVACHOL) 40 MG tablet Take 40 mg by mouth at bedtime. 03/10/16  Yes [provider]  amoxicillin-clavulanate (AUGMENTIN) 875-125 MG tablet Take 1 tablet by mouth 2 (two) times daily. 04/19/16   [provider]    Allergies      Patient has no known allergies.  Review of Systems   Review of Systems  Constitutional: Negative for appetite change and fatigue.  HENT: Negative for congestion, ear discharge and sinus pressure.   Eyes: Negative for discharge.  Respiratory: Negative for cough.   Cardiovascular: Negative for chest pain.  Gastrointestinal: Negative for abdominal pain and diarrhea.  Genitourinary: Negative for frequency and hematuria.  Musculoskeletal: Negative for back pain.  Skin: Negative for rash.  Neurological: Positive for weakness. Negative for seizures and headaches.  Psychiatric/Behavioral: Negative for hallucinations.    Physical Exam Updated Vital Signs BP (!) 155/96   Pulse 100   Temp 98.3 F (36.8 C)   Resp 16   Ht 5\' 4"  (1.626 m)   Wt 72.6 kg   SpO2 97%   BMI 27.46 kg/m   Physical Exam Vitals and nursing note reviewed.  Constitutional:      Appearance: He is well-developed.  HENT:     Head: Normocephalic.     Nose: Nose normal.  Eyes:     General: No scleral icterus.    Conjunctiva/sclera: Conjunctivae normal.  Neck:     Thyroid: No thyromegaly.  Cardiovascular:     Rate and Rhythm: Normal rate and regular rhythm.     Heart sounds: No murmur. No friction rub. No gallop.   Pulmonary:     Breath sounds: No stridor. No wheezing or rales.  Chest:     Chest wall: No tenderness.  Abdominal:  General: There is no distension.     Tenderness: There is no abdominal tenderness. There is no rebound.  Musculoskeletal:     Cervical back: Neck supple.     Comments: Mild weakness to right arm significant weakness to right leg  Lymphadenopathy:     Cervical: No cervical adenopathy.  Skin:    Findings: No erythema or rash.  Neurological:     Mental Status: He is alert and oriented to person, place, and time.     Motor: No abnormal muscle tone.     Coordination: Coordination normal.  Psychiatric:        Behavior: Behavior normal.     ED Results / Procedures /  Treatments   Labs (all labs ordered are listed, but only abnormal results are displayed) Labs Reviewed  COMPREHENSIVE METABOLIC PANEL - Abnormal; Notable for the following components:      Result Value   Chloride 97 (*)    Glucose, Bld 158 (*)    BUN 26 (*)    Creatinine, Ser 1.37 (*)    AST 92 (*)    ALT 69 (*)    GFR calc non Af Amer 49 (*)    GFR calc Af Amer 57 (*)    All other components within normal limits  RESPIRATORY PANEL BY RT PCR (FLU A&B, COVID)  CBC WITH DIFFERENTIAL/PLATELET    EKG EKG Interpretation  Date/Time:  Sunday December 19 2019 14:30:46 EST Ventricular Rate:  99 PR Interval:    QRS Duration: 149 QT Interval:  384 QTC Calculation: 493 R Axis:   69 Text Interpretation: Sinus tachycardia Atrial premature complex Left bundle branch block Confirmed by Fredia Sorrow [phone]) on 12/19/2019 2:34:52 PM   Radiology CT Head Wo Contrast  Result Date: 12/19/2019 CLINICAL DATA:  Right-sided weakness EXAM: CT HEAD WITHOUT CONTRAST TECHNIQUE: Contiguous axial images were obtained from the base of the skull through the vertex without intravenous contrast. COMPARISON:  None. FINDINGS: Brain: There is no acute intracranial hemorrhage, mass-effect, or edema. Gray-white differentiation is preserved. There is no extra-axial fluid collection. Ventricles and sulci are within normal limits in size and configuration. Patchy hypoattenuation in the supratentorial white matter is nonspecific but may reflect mild chronic microvascular ischemic changes. Vascular: There is atherosclerotic calcification at the skull base. Skull: Calvarium is unremarkable. Sinuses/Orbits: Focal opacification of a left ethmoid air cell. Orbits are unremarkable. Other: Mastoid air cells are clear. IMPRESSION: No acute intracranial hemorrhage, mass effect, or evidence of acute infarction. Mild chronic microvascular ischemic changes. Electronically Signed   By: Macy Mis M.D.   On: 12/19/2019 16:09     Procedures Procedures (including critical care time)  Medications Ordered in ED Medications - No data to display  ED Course  I have reviewed the triage vital signs and the nursing notes.  Pertinent labs & imaging results that were available during my care of the patient were reviewed by me and considered in my medical decision making (see chart for details). CRITICAL CARE Performed by: Milton Ferguson Total critical care time:60minutes Critical care time was exclusive of separately billable procedures and treating other patients. Critical care was necessary to treat or prevent imminent or life-threatening deterioration. Critical care was time spent personally by me on the following activities: development of treatment plan with patient and/or surrogate as well as nursing, discussions with consultants, evaluation of patient's response to treatment, examination of patient, obtaining history from patient or surrogate, ordering and performing treatments and interventions, ordering and review of laboratory studies,  ordering and review of radiographic studies, pulse oximetry and re-evaluation of patient's condition.    MDM Rules/Calculators/A&P                      CT head unremarkable.  I suspect the patient has had a stroke.  He is going to be admitted to medicine.  Tried hospitals would like to have a telemetry neuro consult.  Telemetry neuro will see the patient in the emergency department Final Clinical Impression(s) / ED Diagnoses Final diagnoses:  Weakness    Rx / DC Orders ED Discharge Orders    None       Milton Ferguson, MD 12/19/19 1744

## 2019-12-19 NOTE — ED Triage Notes (Signed)
Patient brought in by [facility] from home due to right side weakness with vomiting since Thursday.  Patient can move, but cannot walk.  Patient unable to take blood pressure medications due to nausea and vomiting.  Per EMS, blood pressure is elevated.

## 2019-12-20 ENCOUNTER — Observation Stay (HOSPITAL_COMMUNITY): Payer: Medicare Other

## 2019-12-20 ENCOUNTER — Inpatient Hospital Stay (HOSPITAL_COMMUNITY): Payer: Medicare Other

## 2019-12-20 DIAGNOSIS — N1831 Chronic kidney disease, stage 3a: Secondary | ICD-10-CM | POA: Diagnosis not present

## 2019-12-20 DIAGNOSIS — I639 Cerebral infarction, unspecified: Principal | ICD-10-CM

## 2019-12-20 DIAGNOSIS — R29810 Facial weakness: Secondary | ICD-10-CM | POA: Diagnosis not present

## 2019-12-20 DIAGNOSIS — E1122 Type 2 diabetes mellitus with diabetic chronic kidney disease: Secondary | ICD-10-CM | POA: Diagnosis not present

## 2019-12-20 DIAGNOSIS — I361 Nonrheumatic tricuspid (valve) insufficiency: Secondary | ICD-10-CM | POA: Diagnosis not present

## 2019-12-20 DIAGNOSIS — Z7984 Long term (current) use of oral hypoglycemic drugs: Secondary | ICD-10-CM | POA: Diagnosis not present

## 2019-12-20 DIAGNOSIS — Z79899 Other long term (current) drug therapy: Secondary | ICD-10-CM | POA: Diagnosis not present

## 2019-12-20 DIAGNOSIS — I10 Essential (primary) hypertension: Secondary | ICD-10-CM | POA: Diagnosis not present

## 2019-12-20 DIAGNOSIS — Z20822 Contact with and (suspected) exposure to covid-19: Secondary | ICD-10-CM | POA: Diagnosis not present

## 2019-12-20 DIAGNOSIS — R471 Dysarthria and anarthria: Secondary | ICD-10-CM | POA: Diagnosis not present

## 2019-12-20 DIAGNOSIS — N183 Chronic kidney disease, stage 3 unspecified: Secondary | ICD-10-CM | POA: Diagnosis not present

## 2019-12-20 DIAGNOSIS — E1165 Type 2 diabetes mellitus with hyperglycemia: Secondary | ICD-10-CM | POA: Diagnosis not present

## 2019-12-20 DIAGNOSIS — G9349 Other encephalopathy: Secondary | ICD-10-CM | POA: Diagnosis not present

## 2019-12-20 DIAGNOSIS — E785 Hyperlipidemia, unspecified: Secondary | ICD-10-CM | POA: Diagnosis not present

## 2019-12-20 DIAGNOSIS — Z8249 Family history of ischemic heart disease and other diseases of the circulatory system: Secondary | ICD-10-CM | POA: Diagnosis not present

## 2019-12-20 DIAGNOSIS — K760 Fatty (change of) liver, not elsewhere classified: Secondary | ICD-10-CM | POA: Diagnosis not present

## 2019-12-20 DIAGNOSIS — G8191 Hemiplegia, unspecified affecting right dominant side: Secondary | ICD-10-CM | POA: Diagnosis not present

## 2019-12-20 DIAGNOSIS — I340 Nonrheumatic mitral (valve) insufficiency: Secondary | ICD-10-CM | POA: Diagnosis not present

## 2019-12-20 DIAGNOSIS — I447 Left bundle-branch block, unspecified: Secondary | ICD-10-CM | POA: Diagnosis not present

## 2019-12-20 DIAGNOSIS — E1143 Type 2 diabetes mellitus with diabetic autonomic (poly)neuropathy: Secondary | ICD-10-CM | POA: Diagnosis not present

## 2019-12-20 DIAGNOSIS — I129 Hypertensive chronic kidney disease with stage 1 through stage 4 chronic kidney disease, or unspecified chronic kidney disease: Secondary | ICD-10-CM | POA: Diagnosis not present

## 2019-12-20 DIAGNOSIS — R531 Weakness: Secondary | ICD-10-CM | POA: Diagnosis present

## 2019-12-20 DIAGNOSIS — Z792 Long term (current) use of antibiotics: Secondary | ICD-10-CM | POA: Diagnosis not present

## 2019-12-20 DIAGNOSIS — Z23 Encounter for immunization: Secondary | ICD-10-CM | POA: Diagnosis not present

## 2019-12-20 DIAGNOSIS — R29703 NIHSS score 3: Secondary | ICD-10-CM | POA: Diagnosis not present

## 2019-12-20 DIAGNOSIS — K3184 Gastroparesis: Secondary | ICD-10-CM | POA: Diagnosis not present

## 2019-12-20 LAB — GLUCOSE, CAPILLARY
Glucose-Capillary: 151 mg/dL — ABNORMAL HIGH (ref 70–99)
Glucose-Capillary: 219 mg/dL — ABNORMAL HIGH (ref 70–99)
Glucose-Capillary: 147 mg/dL — ABNORMAL HIGH (ref 70–99)
Glucose-Capillary: 165 mg/dL — ABNORMAL HIGH (ref 70–99)

## 2019-12-20 LAB — HEPATITIS PANEL, ACUTE
HCV Ab: NONREACTIVE
Hep A IgM: NONREACTIVE
Hep B C IgM: NONREACTIVE
Hepatitis B Surface Ag: NONREACTIVE

## 2019-12-20 LAB — ECHOCARDIOGRAM COMPLETE
Height: 64 in
Weight: 2455.04 oz

## 2019-12-20 LAB — LIPID PANEL
Cholesterol: 258 mg/dL — ABNORMAL HIGH (ref 0–200)
HDL: 36 mg/dL — ABNORMAL LOW (ref >40)
LDL Cholesterol: 169 mg/dL — ABNORMAL HIGH (ref 0–99)
Total CHOL/HDL Ratio: 7.2 RATIO
Triglycerides: 267 mg/dL — ABNORMAL HIGH (ref <150)
VLDL: 53 mg/dL — ABNORMAL HIGH (ref 0–40)

## 2019-12-20 LAB — HEMOGLOBIN A1C
Hgb A1c MFr Bld: 8.2 % — ABNORMAL HIGH (ref 4.8–5.6)
Mean Plasma Glucose: 188.64 mg/dL

## 2019-12-20 MED ORDER — INFLUENZA VAC A&B SA ADJ QUAD 0.5 ML IM PRSY
0.5000 mL | PREFILLED_SYRINGE | INTRAMUSCULAR | Status: AC
  Administered 2019-12-21: 12:00:00 0.5 mL via INTRAMUSCULAR
  Filled 2019-12-20: qty 0.5

## 2019-12-20 MED ORDER — GADOBUTROL 1 MMOL/ML IV SOLN
8.0000 mL | Freq: Once | INTRAVENOUS | Status: AC | PRN
  Administered 2019-12-20: 11:00:00 8 mL via INTRAVENOUS

## 2019-12-20 MED ORDER — PNEUMOCOCCAL VAC POLYVALENT 25 MCG/0.5ML IJ INJ
0.5000 mL | INJECTION | INTRAMUSCULAR | Status: AC
  Administered 2019-12-21: 0.5 mL via INTRAMUSCULAR
  Filled 2019-12-20: qty 0.5

## 2019-12-20 MED ORDER — IOHEXOL 300 MG/ML  SOLN
100.0000 mL | Freq: Once | INTRAMUSCULAR | Status: AC | PRN
  Administered 2019-12-20: 01:00:00 100 mL via INTRAVENOUS

## 2019-12-20 MED ORDER — ASPIRIN EC 81 MG PO TBEC
162.0000 mg | DELAYED_RELEASE_TABLET | Freq: Every day | ORAL | Status: DC
  Administered 2019-12-21 – 2019-12-22 (×2): 162 mg via ORAL
  Filled 2019-12-20 (×2): qty 2

## 2019-12-20 NOTE — Progress Notes (Signed)
*  PRELIMINARY RESULTS* Echocardiogram 2D Echocardiogram has been performed.  Eugene Cooper 12/20/2019, 3:20 PM

## 2019-12-20 NOTE — Consult Note (Signed)
Tomball A. Merlene Laughter, MD     [email or web address]          Eugene Cooper is an 79 y.o. male.   ASSESSMENT/PLAN: 1. 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 This is a lacunar event.  Risk factors include hypertension, diabetes and dyslipidemia along with age. Aspirin 162 mg is recommended. Statin can be maximized to help improve the ongoing dyslipidemia. Diabetes also could be better controlled. Physical and occupational therapies will be needed.    Patient is 79 year old [place] male who presents with a  2-3 day history of a acute right hemiparesis and nausea and vomiting which led to the patient presenting to the hospital. It appears that he has had some dysarthria and possibly dysphagia. He the patient speaks very little [place] which makes the history difficult. He seems not to have other complaints however.    GENERAL:   This is a pleasant male who appears about 10 years younger than the stated age.  HEENT:   Neck is supple no trauma appreciated.  ABDOMEN: soft  EXTREMITIES: No edema   BACK:  Normal  SKIN: Normal by inspection.    MENTAL STATUS:  The patient is awake and alert and the follow commands with prompting given the severe language barrier. He does not appear to have significant dysarthria.  CRANIAL NERVES: Pupils are equal, round and reactive to light ; extra ocular movements are full, there is no significant nystagmus; visual fields are full; upper and lower facial muscles are normal in strength and symmetric, there is no flattening of the nasolabial folds; tongue is midline; uvula is midline; shoulder elevation is normal.  MOTOR:  The right upper extremity is graded as 3/5 in weakness. Tone is reduced. There is significant drift. The right lower extremities graded as 2/5.  The left side both upper lower extremities show normal tone, bulk and strength; no pronator  drift.  COORDINATION: Left finger to nose is normal, right finger to nose is normal, No rest tremor; no intention tremor; no postural tremor; no bradykinesia.  SENSATION: Normal to light touch   Blood pressure (P) 131/64, pulse (P) 68, temperature (P) 98.7 F (37.1 C), temperature source (P) Oral, resp. rate (P) 16, height 5\' 4"  (1.626 m), weight 69.6 kg, SpO2 97 %.  Past Medical History:  Diagnosis Date  . Hyperlipemia   . Hypertension     History reviewed. No pertinent surgical history.  History reviewed. No pertinent family history.  Social History:  reports that he has never smoked. He has never used smokeless tobacco. He reports previous alcohol use. He reports that he does not use drugs.  Allergies: No Known Allergies  Medications: Prior to Admission medications   Medication Sig Start Date End Date Taking? Authorizing Provider  lisinopril-hydrochlorothiazide (PRINZIDE,ZESTORETIC) 20-25 MG tablet Take 1 tablet by mouth daily. 03/10/16  Yes [provider]  metFORMIN (GLUCOPHAGE) 1000 MG tablet Take 1 tablet (1,000 mg total) by mouth 2 (two) times daily with a meal. 04/25/16  Yes Virgel Manifold, MD  pravastatin (PRAVACHOL) 40 MG tablet Take 40 mg by mouth at bedtime. 03/10/16  Yes [provider]  amoxicillin-clavulanate (AUGMENTIN) 875-125 MG tablet Take 1 tablet by mouth 2 (two) times daily. 04/19/16   [provider]    Scheduled Meds: . aspirin EC  81 mg Oral Daily  . atorvastatin  40 mg Oral q1800  . [START ON 12/21/2019] influenza vaccine adjuvanted  0.5 mL Intramuscular Tomorrow-1000  .  insulin aspart  0-5 Units Subcutaneous QHS  . insulin aspart  0-9 Units Subcutaneous TID WC  . [START ON 12/21/2019] pneumococcal 23 valent vaccine  0.5 mL Intramuscular Tomorrow-1000   Continuous Infusions: PRN Meds:.acetaminophen **OR** acetaminophen, labetalol, ondansetron **OR** ondansetron (ZOFRAN) IV, polyethylene glycol, sodium chloride  flush     Results for orders placed or performed during the hospital encounter of 12/19/19 (from the past 48 hour(s))  CBC with Differential     Status: None   Collection Time: 12/19/19  3:42 PM  Result Value Ref Range   WBC 9.3 4.0 - 10.5 K/uL   RBC 5.04 4.22 - 5.81 MIL/uL   Hemoglobin 15.3 13.0 - 17.0 g/dL   HCT 44.9 39.0 - 52.0 %   MCV 89.1 80.0 - 100.0 fL   MCH 30.4 26.0 - 34.0 pg   MCHC 34.1 30.0 - 36.0 g/dL   RDW 13.0 11.5 - 15.5 %   Platelets 309 150 - 400 K/uL   nRBC 0.0 0.0 - 0.2 %   Neutrophils Relative % 64 %   Neutro Abs 6.0 1.7 - 7.7 K/uL   Lymphocytes Relative 25 %   Lymphs Abs 2.3 0.7 - 4.0 K/uL   Monocytes Relative 7 %   Monocytes Absolute 0.7 0.1 - 1.0 K/uL   Eosinophils Relative 2 %   Eosinophils Absolute 0.2 0.0 - 0.5 K/uL   Basophils Relative 1 %   Basophils Absolute 0.1 0.0 - 0.1 K/uL   Immature Granulocytes 1 %   Abs Immature Granulocytes 0.06 0.00 - 0.07 K/uL    Comment: Performed at [facility], [address] [place], [place] [address]  Comprehensive metabolic panel     Status: Abnormal   Collection Time: 12/19/19  3:42 PM  Result Value Ref Range   Sodium 136 135 - 145 mmol/L   Potassium 3.5 3.5 - 5.1 mmol/L   Chloride 97 (L) 98 - 111 mmol/L   CO2 24 22 - 32 mmol/L   Glucose, Bld 158 (H) 70 - 99 mg/dL   BUN 26 (H) 8 - 23 mg/dL   Creatinine, Ser 1.37 (H) 0.61 - 1.24 mg/dL   Calcium 9.5 8.9 - 10.3 mg/dL   Total Protein 7.2 6.5 - 8.1 g/dL   Albumin 4.1 3.5 - 5.0 g/dL   AST 92 (H) 15 - 41 U/L   ALT 69 (H) 0 - 44 U/L   Alkaline Phosphatase 59 38 - 126 U/L   Total Bilirubin 0.8 0.3 - 1.2 mg/dL   GFR calc non Af Amer 49 (L) >60 mL/min   GFR calc Af Amer 57 (L) >60 mL/min   Anion gap 15 5 - 15    Comment: Performed at [facility], [address], [place], [place] [address]  Troponin I (High Sensitivity)     Status: None   Collection Time: 12/19/19  3:42 PM  Result Value Ref Range   Troponin I (High Sensitivity) 6 <18 ng/L    Comment:  (NOTE) Elevated high sensitivity troponin I (hsTnI) values and significant  changes across serial measurements may suggest ACS but many other  chronic and acute conditions are known to elevate hsTnI results.  Refer to the "Links" section for chest pain algorithms and additional  guidance. Performed at [facility], [address] [place], [place] [address]   Respiratory Panel by RT PCR (Flu A&B, Covid) - Nasopharyngeal Swab     Status: None   Collection Time: 12/19/19  5:18 PM   Specimen: Nasopharyngeal Swab  Result Value Ref Range   SARS Coronavirus 2 by RT PCR NEGATIVE NEGATIVE    Comment: (NOTE) SARS-CoV-2 target nucleic acids are NOT DETECTED. The SARS-CoV-2 RNA is generally detectable in upper respiratoy specimens during the acute phase of infection. The lowest concentration of SARS-CoV-2 viral copies this assay can detect is 131 copies/mL. A negative result does not preclude SARS-Cov-2 infection and should not be used as the sole basis for treatment or other patient management decisions. A negative result may occur with  improper specimen collection/handling, submission of specimen other than nasopharyngeal swab, presence of viral mutation(s) within the areas targeted by this assay, and inadequate number of viral copies (<131 copies/mL). A negative result must be combined with clinical observations, patient history, and epidemiological information. The expected result is Negative. Fact Sheet for Patients:  PinkCheek.be Fact Sheet for Healthcare Providers:  GravelBags.it This test is not yet ap proved or cleared by the [place] FDA and  has been authorized for detection and/or diagnosis of SARS-CoV-2 by FDA under an Emergency Use Authorization (EUA). This EUA will remain  in effect (meaning this test can be used) for the duration of the COVID-19 declaration under Section 564(b)(1) of the Act, 21 U.S.C. section  360bbb-3(b)(1), unless the authorization is terminated or revoked sooner.    Influenza A by PCR NEGATIVE NEGATIVE   Influenza B by PCR NEGATIVE NEGATIVE    Comment: (NOTE) The Xpert Xpress SARS-CoV-2/FLU/RSV assay is intended as an aid in  the diagnosis of influenza from Nasopharyngeal swab specimens and  should not be used as a sole basis for treatment. Nasal washings and  aspirates are unacceptable for Xpert Xpress SARS-CoV-2/FLU/RSV  testing. Fact Sheet for Patients: PinkCheek.be Fact Sheet for Healthcare Providers: GravelBags.it This test is not yet approved or cleared by the [place] FDA and  has been authorized for detection and/or diagnosis of SARS-CoV-2 by  FDA under an Emergency Use Authorization (EUA). This EUA will remain  in effect (meaning this test can be used) for the duration of the  Covid-19 declaration under Section 564(b)(1) of the Act, 21  U.S.C. section 360bbb-3(b)(1), unless the authorization is  terminated or revoked. Performed at [facility], [address] [place], [place] [address]   Glucose, capillary     Status: Abnormal   Collection Time: 12/19/19  9:19 PM  Result Value Ref Range   Glucose-Capillary 137 (H) 70 - 99 mg/dL   Comment 1 Notify RN    Comment 2 Document in Chart   Troponin I (High Sensitivity)     Status: None   Collection Time: 12/19/19  9:57 PM  Result Value Ref Range   Troponin I (High Sensitivity) 8 <18 ng/L    Comment: (NOTE) Elevated high sensitivity troponin I (hsTnI) values and significant  changes across serial measurements may suggest ACS but many other  chronic and acute conditions are known to elevate hsTnI results.  Refer to the "Links" section for chest pain algorithms and additional  guidance. Performed at [facility], [address], [place], [place] [address]   Hemoglobin A1c     Status: Abnormal   Collection Time: 12/20/19  6:39 AM  Result Value Ref  Range   Hgb A1c MFr Bld 8.2 (H) 4.8 - 5.6 %    Comment: (NOTE) Pre diabetes:          5.7%-6.4% Diabetes:              >6.4% Glycemic control for   <7.0% adults with diabetes  Mean Plasma Glucose 188.64 mg/dL    Comment: Performed at [facility] [address] [place], [place] [address]  Lipid panel     Status: Abnormal   Collection Time: 12/20/19  6:39 AM  Result Value Ref Range   Cholesterol 258 (H) 0 - 200 mg/dL   Triglycerides 267 (H) <150 mg/dL   HDL 36 (L) >40 mg/dL   Total CHOL/HDL Ratio 7.2 RATIO   VLDL 53 (H) 0 - 40 mg/dL   LDL Cholesterol 169 (H) 0 - 99 mg/dL    Comment:        Total Cholesterol/HDL:CHD Risk Coronary Heart Disease Risk Table                     Men   Women  1/2 Average Risk   3.4   3.3  Average Risk       5.0   4.4  2 X Average Risk   9.6   7.1  3 X Average Risk  23.4   11.0        Use the calculated Patient Ratio above and the CHD Risk Table to determine the patient's CHD Risk.        ATP III CLASSIFICATION (LDL):  <100     mg/dL   Optimal  100-129  mg/dL   Near or Above                    Optimal  130-159  mg/dL   Borderline  160-189  mg/dL   High  >190     mg/dL   Very High Performed at [facility], [address] [place], [place] [address]   Hepatitis panel, acute     Status: None   Collection Time: 12/20/19  6:39 AM  Result Value Ref Range   Hepatitis B Surface Ag NON REACTIVE NON REACTIVE   HCV Ab NON REACTIVE NON REACTIVE    Comment: (NOTE) Nonreactive HCV antibody screen is consistent with no HCV infections,  unless recent infection is suspected or other evidence exists to indicate HCV infection.    Hep A IgM NON REACTIVE NON REACTIVE   Hep B C IgM NON REACTIVE NON REACTIVE    Comment: Performed at [facility], [place] [address] [place], [place] [address]  Glucose, capillary     Status: Abnormal   Collection Time: 12/20/19  7:39 AM  Result Value Ref Range   Glucose-Capillary 151 (H) 70 - 99 mg/dL  Glucose,  capillary     Status: Abnormal   Collection Time: 12/20/19 11:42 AM  Result Value Ref Range   Glucose-Capillary 219 (H) 70 - 99 mg/dL  Glucose, capillary     Status: Abnormal   Collection Time: 12/20/19  4:18 PM  Result Value Ref Range   Glucose-Capillary 147 (H) 70 - 99 mg/dL    Studies/Results:  TTE  1. Left ventricular ejection fraction, by visual estimation, is 55 to 60%. The left ventricle has normal function. There is mildly increased left ventricular hypertrophy.  2. Left ventricular diastolic parameters are consistent with Grade I diastolic dysfunction (impaired relaxation).  3. The left ventricle has no regional wall motion abnormalities.  4. Global right ventricle has normal systolic function.The right ventricular size is normal. No increase in right ventricular wall thickness.  5. Left atrial size was normal.  6. Right atrial size was normal.  7. Presence of pericardial fat pad.  8. Mild mitral annular calcification.  9. The  mitral valve is grossly normal. No evidence of mitral valve regurgitation. 10. The tricuspid valve is grossly normal. 11. The aortic valve is tricuspid. Aortic valve regurgitation is not visualized. 12. The pulmonic valve was grossly normal. Pulmonic valve regurgitation is not visualized. 13. Aortic dilatation noted. 14. 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 15. Mildly elevated pulmonary artery systolic pressure. 16. The tricuspid regurgitant velocity is 2.61 m/s, and with an assumed right atrial pressure of 3 mmHg, the estimated right ventricular systolic pressure is mildly elevated at 30.2 mmHg. 17. The inferior vena cava is normal in size with greater than 50% respiratory variability, suggesting right atrial pressure of 3 mmHg.    BRAIN NECK MRA BRAIN MRI FINDINGS: MRI HEAD FINDINGS  Brain: There is reduced diffusion in the parasagittal left pons. No evidence of hemorrhage. Patchy T2 hyperintensity in the supratentorial white matter  is nonspecific but may reflect mild chronic microvascular ischemic changes. Ventricles and sulci are within normal limits in size and configuration. There is no intracranial mass, mass effect, hydrocephalus, or extra-axial fluid collection.  Vascular: Major vessel flow voids at the skull base are preserved.  Skull and upper cervical spine: Marrow signal is unremarkable.  Sinuses/Orbits: No acute abnormality.  Other: None.  MRA HEAD FINDINGS  Intracranial internal carotid arteries are patent. Middle and anterior cerebral arteries are patent. Intracranial vertebral arteries, basilar artery, posterior cerebral arteries are patent. A left posterior communicating artery is present. There is no significant stenosis or aneurysm.  MRA NECK FINDINGS  Common, internal, and external carotid arteries are patent. Extracranial vertebral arteries are patent. No hemodynamically significant stenosis or evidence of dissection.  IMPRESSION: Acute infarction of the parasagittal left pons.  Mild chronic microvascular ischemic changes.  No hemodynamically significant stenosis or evidence of dissection.     Brain MRI is reviewed in person shows acute infarct involving the left pontine area with increased signal on DWI and reduced signal on ADC scan. 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 No hemorrhages appreciated. MRA shows luminal irregularity and dropout signal involving the PCA on the left side. Otherwise no significant stenosis is noted.   Dosha Broshears A. Merlene Laughter, M.D.  Diplomate, Tax adviser of Psychiatry and Neurology ( Neurology). 12/20/2019, 5:34 PM

## 2019-12-20 NOTE — Progress Notes (Signed)
Rehab Admissions Coordinator Note:  Patient was screened by Cleatrice Burke for appropriateness for an Inpatient Acute Rehab admit at [place] per PT recommendation At this time, we are recommending Inpatient Rehab consult. I will place order per protocol and an Admissions Coordinator will assess for candidacy.   Cleatrice Burke 12/20/2019, 10:20 AM  I can be reached at [phone].

## 2019-12-20 NOTE — Plan of Care (Signed)
  Problem: Acute Rehab PT Goals(only PT should resolve) Goal: Pt Will Go Supine/Side To Sit Outcome: Progressing Flowsheets (Taken 12/20/2019 0920) Pt will go Supine/Side to Sit: with supervision Goal: Patient Will Transfer Sit To/From Stand Outcome: Progressing Flowsheets (Taken 12/20/2019 0920) Patient will transfer sit to/from stand: with minimal assist Goal: Pt Will Transfer Bed To Chair/Chair To Bed Outcome: Progressing Flowsheets (Taken 12/20/2019 0920) Pt will Transfer Bed to Chair/Chair to Bed: with min assist Goal: Pt Will Ambulate Outcome: Progressing Flowsheets (Taken 12/20/2019 0920) Pt will Ambulate:  75 feet  with minimal assist  with moderate assist  with rolling walker   9:21 AM, 12/20/19 Lonell Grandchild, MPT Physical Therapist with [facility] 336 [phone] office [phone] mobile phone

## 2019-12-20 NOTE — Progress Notes (Signed)
SLP Cancellation Note  Patient Details Name: Eugean Stake MRN: [id number] DOB: [birth year]   Cancelled treatment:       Reason Eval/Treat Not Completed: Other (comment);Patient at procedure or test/unavailable; Pt with echo.  Thank you,  Genene Churn, [place]  [place] 12/20/2019, 3:10 PM

## 2019-12-20 NOTE — Evaluation (Signed)
Physical Therapy Evaluation Patient Details Name: Eugene Cooper MRN: [id number] DOB: [birth year] Today's Date: 12/20/2019   History of Present Illness  Eugene Cooper is a 79 y.o. male with medical history significant for hypertension, dyslipidemia.  Patient is Spanish-speaking, history is obtained with the help of an tele- interpreter.  Patient presented to the ED with complaints of weakness with his right upper extremity and right lower extremity weakness with resultant inability to walk.  He also reports change in his voice, but is unable to confirm facial asymmetry.  He tells me symptoms started 2 to 3 days ago.he also reports he was having multiple episodes of vomiting that day that lasted just 1 day, and loose stools of 2 days duration.  Reported diffuse abdominal aches, generalized body aches, pain in his ankles also.  No specific complaints of chest pain, he is unable to tell me specifically if he has had any difficulty breathing.  After vomiting stopped, his abdominal pain has persisted to today, also today right-sided weakness hence he presented to the ED today.Denies current alcohol intake.  Denies Covid positive contacts.    Clinical Impression  Patient able to follow directions and cooperative with therapy with use of interpreter.  Patient demonstrates limited use of RUE due to weakness/decreased coordination requiring assistance to put shoes on, very unsteady on feet with near fall when attempting to stand without AD due to RLE weakness, able to take steps using RW, but drags RLE requiring frequent tactile assistance to help advance, once fatigued tends to push RW to far in front and tolerated sitting up in chair to eat breakfast after therapy - NT notified.  Patient will benefit from continued physical therapy in hospital and recommended venue below to increase strength, balance, endurance for safe ADLs and gait.    Follow Up Recommendations CIR    Equipment Recommendations  Rolling walker with 5" wheels    Recommendations for Other Services       Precautions / Restrictions Precautions Precautions: Fall Restrictions Weight Bearing Restrictions: No      Mobility  Bed Mobility Overal bed mobility: Needs Assistance Bed Mobility: Supine to Sit     Supine to sit: Min assist     General bed mobility comments: slow labored movement  Transfers Overall transfer level: Needs assistance Equipment used: Rolling walker (2 wheeled) Transfers: Sit to/from [facility] Sit to Stand: Min assist;Mod assist Stand pivot transfers: Mod assist       General transfer comment: unable to stand without AD due to right sided weakness, required use of RW  Ambulation/Gait Ambulation/Gait assistance: Mod assist Gait Distance (Feet): 18 Feet Assistive device: Rolling walker (2 wheeled) Gait Pattern/deviations: Decreased step length - right;Decreased stance time - right;Decreased stride length;Trunk flexed;Scissoring;Ataxic Gait velocity: decreased   General Gait Details: slow labored cadence with difficulty advancing RLE, very unsteady with tendency to drag RLE requiring assistance to move RLE to prevent falling  Stairs            Wheelchair Mobility    Modified Rankin (Stroke Patients Only)       Balance Overall balance assessment: Needs assistance Sitting-balance support: Feet supported;No upper extremity supported Sitting balance-Leahy Scale: Fair Sitting balance - Comments: fair/good seated at EOB   Standing balance support: During functional activity;No upper extremity supported Standing balance-Leahy Scale: Zero Standing balance comment: unable to stand without AD, fair static standing using RW, fair/poor dynamic with RW  Pertinent Vitals/Pain Pain Assessment: 0-10 Pain Score: 5  Pain Location: right sided numbness Pain Descriptors / Indicators: Numbness Pain Intervention(s): Limited  activity within patient's tolerance;Monitored during session    Home Living Family/patient expects to be discharged to:: Private residence Living Arrangements: Children Available Help at Discharge: Family;Available PRN/intermittently Type of Home: House Home Access: Stairs to enter Entrance Stairs-Rails: Right Entrance Stairs-Number of Steps: 6 Home Layout: One level Home Equipment: Cane - single point      Prior Function Level of Independence: Independent         Comments: household and short distance community ambulator with [facility] PRN     Hand Dominance   Dominant Hand: Right    Extremity/Trunk Assessment   Upper Extremity Assessment Upper Extremity Assessment: Defer to OT evaluation    Lower Extremity Assessment Lower Extremity Assessment: Generalized weakness;RLE deficits/detail;LLE deficits/detail RLE Deficits / Details: grossly 3+/5 RLE Sensation: decreased proprioception;decreased light touch RLE Coordination: decreased fine motor;decreased gross motor LLE Deficits / Details: grossly 5/5 LLE Sensation: WNL LLE Coordination: WNL    Cervical / Trunk Assessment Cervical / Trunk Assessment: Kyphotic  Communication   Communication: Prefers language other than English;Interpreter utilized  Cognition Arousal/Alertness: Awake/alert Behavior During Therapy: WFL for tasks assessed/performed Overall Cognitive Status: Within Functional Limits for tasks assessed                                        General Comments      Exercises     Assessment/Plan    PT Assessment Patient needs continued PT services  PT Problem List Decreased strength;Decreased activity tolerance;Decreased balance;Decreased mobility;Decreased coordination       PT Treatment Interventions Balance training;Gait training;Stair training;Functional mobility training;Therapeutic activities;Therapeutic exercise;Patient/family education    PT Goals (Current goals can be found in  the Care Plan section)  Acute Rehab PT Goals Patient Stated Goal: return home after rehab PT Goal Formulation: With patient Time For Goal Achievement: 01/03/20 Potential to Achieve Goals: Good    Frequency 7X/week   Barriers to discharge        Co-evaluation               AM-PAC PT "6 Clicks" Mobility  Outcome Measure Help needed turning from your back to your side while in a flat bed without using bedrails?: A Little Help needed moving from lying on your back to sitting on the side of a flat bed without using bedrails?: A Little Help needed moving to and from a bed to a chair (including a wheelchair)?: A Lot Help needed standing up from a chair using your arms (e.g., wheelchair or bedside chair)?: A Lot Help needed to walk in hospital room?: A Lot Help needed climbing 3-5 steps with a railing? : Total 6 Click Score: 13    End of Session Equipment Utilized During Treatment: Gait belt Activity Tolerance: Patient tolerated treatment well;Patient limited by fatigue Patient left: in chair;with call bell/phone within reach;with chair alarm set Nurse Communication: Mobility status PT Visit Diagnosis: Unsteadiness on feet (R26.81);Other abnormalities of gait and mobility (R26.89);Muscle weakness (generalized) (M62.81)    Time: [id number] PT Time Calculation (min) (ACUTE ONLY): 34 min   Charges:   PT Evaluation $PT Eval Moderate Complexity: 1 Mod PT Treatments $Therapeutic Activity: 23-37 mins        9:18 AM, 12/20/19 Lonell Grandchild, MPT Physical Therapist with [facility] 336 [phone] office  [place] mobile phone

## 2019-12-20 NOTE — Consult Note (Addendum)
Cardiology Consultation:   Patient ID: Trayveon Hagemeier MRN: [id number]; DOB: 1941/11/07  Admit date: 12/19/2019 Date of Consult: 12/20/2019  Primary Care Provider: Patient, No Pcp Per Primary Cardiologist: No primary care provider on file. new Primary Electrophysiologist:  None     Patient Profile:   Mizraim Ganson is a 79 y.o. male with a hx of hypertension hyperlipidemia who is being seen today for the evaluation of new LBBB at the request of Dr. Denton Brick  History of Present Illness:   Mr. Boulanger is a Spanish-speaking male patient with history of hypertension and hyperlipidemia who presented with right upper extremity and right lower extremity weakness and inability to walk and suspected CVA, head CT negative. 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  Patient history obtained through interpreter [place]. Patient is a difficult historian. Lives with daughter and son. Was doing house work and shopping without difficulty up until 1 week ago when he developed symptoms of left sided weakness, dizziness. When asked about chest pain sometimes he says he has it with the presenting symptoms and other times he denies it. He is very focused on getting treatment for his CVA. No family history of CAD and nonsmoker.   Heart Pathway Score:     Past Medical History:  Diagnosis Date  . Hyperlipemia   . Hypertension     History reviewed. No pertinent surgical history.   Home Medications:  Prior to Admission medications   Medication Sig Start Date End Date Taking? Authorizing Provider  lisinopril-hydrochlorothiazide (PRINZIDE,ZESTORETIC) 20-25 MG tablet Take 1 tablet by mouth daily. 03/10/16  Yes [provider]  metFORMIN (GLUCOPHAGE) 1000 MG tablet Take 1 tablet (1,000 mg total) by mouth 2 (two) times daily with a meal. 04/25/16  Yes Virgel Manifold, MD  pravastatin (PRAVACHOL) 40 MG tablet Take 40 mg by mouth at  bedtime. 03/10/16  Yes [provider]  amoxicillin-clavulanate (AUGMENTIN) 875-125 MG tablet Take 1 tablet by mouth 2 (two) times daily. 04/19/16   [provider]    Inpatient Medications: Scheduled Meds: . aspirin EC  81 mg Oral Daily  . atorvastatin  40 mg Oral q1800  . [START ON 12/21/2019] influenza vaccine adjuvanted  0.5 mL Intramuscular Tomorrow-1000  . insulin aspart  0-5 Units Subcutaneous QHS  . insulin aspart  0-9 Units Subcutaneous TID WC  . iohexol       Continuous Infusions:  PRN Meds: acetaminophen **OR** acetaminophen, labetalol, ondansetron **OR** ondansetron (ZOFRAN) IV, polyethylene glycol, sodium chloride flush  Allergies:   No Known Allergies  Social History:   Social History   Socioeconomic History  . Marital status: Single    Spouse name: Not on file  . Number of children: Not on file  . Years of education: Not on file  . Highest education level: Not on file  Occupational History  . Not on file  Tobacco Use  . Smoking status: Never Smoker  . Smokeless tobacco: Never Used  Substance and Sexual Activity  . Alcohol use: Not Currently  . Drug use: Never  . Sexual activity: Not on file  Other Topics Concern  . Not on file  Social History Narrative  . Not on file   Social Determinants of Health   Financial Resource Strain:   . Difficulty of Paying Living Expenses: Not on file  Food Insecurity:   . Worried About Charity fundraiser in the Last Year: Not on file  .  Ran Out of Food in the Last Year: Not on file  Transportation Needs:   . Lack of Transportation (Medical): Not on file  . Lack of Transportation (Non-Medical): Not on file  Physical Activity:   . Days of Exercise per Week: Not on file  . Minutes of Exercise per Session: Not on file  Stress:   . Feeling of Stress : Not on file  Social Connections:   . Frequency of Communication with Friends and Family: Not on file  . Frequency of Social Gatherings with Friends and  Family: Not on file  . Attends Religious Services: Not on file  . Active Member of Clubs or Organizations: Not on file  . Attends Archivist Meetings: Not on file  . Marital Status: Not on file  Intimate Partner Violence:   . Fear of Current or Ex-Partner: Not on file  . Emotionally Abused: Not on file  . Physically Abused: Not on file  . Sexually Abused: Not on file    Family History:     History reviewed. No pertinent family history.   ROS:  Please see the history of present illness.  Review of Systems  Constitution: Negative.  HENT: Negative.   Cardiovascular: Negative.   Respiratory: Negative.   Endocrine: Negative.   Hematologic/Lymphatic: Negative.   Musculoskeletal: Negative.   Gastrointestinal: Negative.   Genitourinary: Negative.   Neurological: Positive for dizziness, light-headedness, loss of balance, numbness and paresthesias.   All other ROS reviewed and negative.     Physical Exam/Data:   Vitals:   12/20/19 0121 12/20/19 0317 12/20/19 0500 12/20/19 0708  BP: 136/79 123/90 111/68 131/85  Pulse: 72 75 74 65  Resp:  18 18 16   Temp: 98 F (36.7 C) 97.6 F (36.4 C) 97.8 F (36.6 C) 98.1 F (36.7 C)  TempSrc: Oral Oral Oral Oral  SpO2: 98% 97% 96% 97%  Weight:      Height:        Intake/Output Summary (Last 24 hours) at 12/20/2019 0946 Last data filed at 12/20/2019 0600 Gross per 24 hour  Intake 649.6 ml  Output 900 ml  Net -250.4 ml   Last 3 Weights 12/19/2019 12/19/2019  Weight (lbs) 153 lb 7 oz 160 lb  Weight (kg) 69.6 kg 72.576 kg     Body mass index is 26.34 kg/m.  General:  Well nourished, well developed, in no acute distress  HEENT: normal Lymph: no adenopathy Neck: no JVD Endocrine:  No thryomegaly Vascular: No carotid bruits; FA pulses 2+ bilaterally without bruits  Cardiac:  normal S1, S2; RRR; no murmur   Lungs:  clear to auscultation bilaterally, no wheezing, rhonchi or rales  Abd: soft, nontender, no hepatomegaly  Ext:  no edema Musculoskeletal:  No deformities, BUE and BLE strength normal and equal Skin: warm and dry  Neuro:  CNs 2-12 intact,left sided weakness upper and lower Psych:  Normal affect   EKG:  The EKG was personally reviewed and demonstrates: Normal sinus rhythm with 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  EKG from 2017 reviewed normal sinus rhythm with inferior lateral T wave inversion Telemetry:  Telemetry was personally reviewed and demonstrates:  NSR with occasional PAC, PVC  Relevant CV Studies: Echo pending  Laboratory Data:  High Sensitivity Troponin:   Recent Labs  Lab 12/19/19 1542 12/19/19 2157  TROPONINIHS 6 8     Chemistry Recent Labs  Lab 12/19/19 1542  NA 136  K 3.5  CL  97*  CO2 24  GLUCOSE 158*  BUN 26*  CREATININE 1.37*  CALCIUM 9.5  GFRNONAA 49*  GFRAA 57*  ANIONGAP 15    Recent Labs  Lab 12/19/19 1542  PROT 7.2  ALBUMIN 4.1  AST 92*  ALT 69*  ALKPHOS 59  BILITOT 0.8   Hematology Recent Labs  Lab 12/19/19 1542  WBC 9.3  RBC 5.04  HGB 15.3  HCT 44.9  MCV 89.1  MCH 30.4  MCHC 34.1  RDW 13.0  PLT 309   BNPNo results for input(s): BNP, PROBNP in the last 168 hours.  DDimer No results for input(s): DDIMER in the last 168 hours.   Radiology/Studies:  CT Head Wo Contrast  Result Date: 12/19/2019 CLINICAL DATA:  Right-sided weakness EXAM: CT HEAD WITHOUT CONTRAST TECHNIQUE: Contiguous axial images were obtained from the base of the skull through the vertex without intravenous contrast. COMPARISON:  None. FINDINGS: Brain: There is no acute intracranial hemorrhage, mass-effect, or edema. Gray-white differentiation is preserved. There is no extra-axial fluid collection. Ventricles and sulci are within normal limits in size and configuration. Patchy hypoattenuation in the supratentorial white matter is nonspecific but may reflect mild chronic microvascular ischemic changes. Vascular: There is atherosclerotic  calcification at the skull base. Skull: Calvarium is unremarkable. Sinuses/Orbits: Focal opacification of a left ethmoid air cell. Orbits are unremarkable. Other: Mastoid air cells are clear. IMPRESSION: No acute intracranial hemorrhage, mass effect, or evidence of acute infarction. Mild chronic microvascular ischemic changes. Electronically Signed   By: Macy Mis M.D.   On: 12/19/2019 16:09   CT ABDOMEN PELVIS W CONTRAST  Result Date: 12/20/2019 CLINICAL DATA:  Abdominal pain with vomiting and diarrhea EXAM: CT ABDOMEN AND PELVIS WITH CONTRAST TECHNIQUE: Multidetector CT imaging of the abdomen and pelvis was performed using the standard protocol following bolus administration of intravenous contrast. CONTRAST:  156mL OMNIPAQUE IOHEXOL 300 MG/ML  SOLN COMPARISON:  None. FINDINGS: LOWER CHEST: Cardiomegaly and bibasilar atelectasis. HEPATOBILIARY: Diffuse hypoattenuation of the liver relative to the spleen suggests hepatic steatosis. No focal liver lesion or biliary dilatation. Normal gallbladder. PANCREAS: Normal pancreas. No ductal dilatation or peripancreatic fluid collection. SPLEEN: Normal. ADRENALS/URINARY TRACT: The adrenal glands are normal. No hydronephrosis, nephroureterolithiasis or solid renal mass. The urinary bladder is normal for degree of distention STOMACH/BOWEL: There is no hiatal hernia. Normal duodenal course and caliber. No small bowel dilatation or inflammation. No focal colonic abnormality. Normal appendix. VASCULAR/LYMPHATIC: There is calcific atherosclerosis of the abdominal aorta. No abdominal or pelvic lymphadenopathy. REPRODUCTIVE: Normal prostate size with symmetric seminal vesicles. MUSCULOSKELETAL. No bony spinal canal stenosis or focal osseous abnormality. OTHER: None. IMPRESSION: 1. No acute abnormality of the abdomen or pelvis. 2. Hepatic steatosis. 3. Aortic Atherosclerosis (ICD10-I70.0). Electronically Signed   By: Ulyses Jarred M.D.   On: 12/20/2019 01:13          Assessment and Plan:   1. Right-sided weakness rule out CVA-CT negative, MRI pending 2. 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 Was active prior to that without any symptoms. Troponins negative. Would complete stroke workup and can do lexiscan as outpatient. 3. Essential hypertension BP stable 4. Hyperlipidemia LDL 169 trig 267 5. Abdominal pain with resolved vomiting and diarrhea-CT negative 6. CKD stage III Crt 1.37 7. Diabetes mellitus type 2      For questions or updates, please contact [facility] Please consult [email or web address] for contact info under     Signed, Ermalinda Barrios, PA-C  12/20/2019 9:46 AM   Attending note  Patient is spanish speaking, electonic translation services were used.   Patient seen and discussed with PA Bonnell Public, I agree with her documentation. 79 yo male history of HTN, hyperlipidemia admitted with right sided weakness starting 2-3 days prior to admission. Admitted for stroke workup by primary team. Cardiology consulted for finding of LBBB on EKG, new finding. There is nothing in the history to support an acute ischemia, trop is negative, has not had significant chest pains. Suspect subactue LBBB.  F/u echo, would plan for outpatient follow up and likely lexiscan once he has recovered from his stroke, this is not urgent and no indication to peform as outpatient. We will f/u echo, if no significant findings sign off inpatient care and arrange outpatient f/u   Carlyle Dolly MD

## 2019-12-20 NOTE — Progress Notes (Signed)
PROGRESS NOTE    Eugene Cooper  [id number] DOB: Feb 18, 1941 DOA: 12/19/2019 PCP: Arnoldo Lenis, MD      Brief Narrative:  Eugene Cooper is a 79 y.o. M with HTN, DM, and hyperlipidemia who presented with few days N/V/D and abdominal pain and then acute right hemiparesis.  Evidently, patient had had a few days of nausea and vomiting and malaise, as well as right arm weakness.  By the day of admission, his abdominal symptoms improved, but his right hemiparesis persisted, he couldn't walk, so came to ER.  In the ER, CT head normal.  Appeared still weak on the right side.  Tele-neuro were consulted, did not recommend tPA given symptoms present >24 hours.      Assessment & Plan:  Suspected stroke -Non-invasive angiography showed no significant stenosis -Echocardiogram ordered and pending -Carotid imaging showed no stenoses on MRA neck   -Lipids ordered: stop pravastatin, start atorvastatin 40 -Aspirin ordered at admission --> continue ASA 81 -Atrial fibrillation: monitor on tele, no afib noted yet -tPA not given because symptoms present >24 hours -Dysphagia screen ordered in ER -PT eval ordered: recommended CIR -Smoking cessation: not pertinent, non-smoker   Hypertension BP controlled overnight -Permissive HTN for now, hold lisinopril HCTZ  Diabetes Glucoses controlled -Continue SS corrections -Hold metformin  CKD IIIa Cr stable relative to baseline  N/V/D CT abdomen unremarkable except for fatty liver.  These symptoms seem resolved.  New LBBB No clear chest pain complaints at this time.  Troponins undetectable. Cardiology were consulted on admission.  They have evaluated patient and recommend outpatient [facility]. -Outpatient cardiology referral for Lexiscan       Disposition: The patient was admitted with right-sided hemiparesis and inability to walk.  His MRI shows stroke and he has persistent severe right hemiparesis limiting ambulation. I will  discharge when his work-up for secondary causes of stroke is complete and rehabilitation placement has been done.        MDM: The below labs and imaging reports were reviewed and summarized above.  Medication management as above.   DVT prophylaxis: Lovenox Code Status: FULL Family Communication: Attempted call to DIL, no answer    Consultants:   Neurology  Procedures:   1/10 CT head--unremarkable  1/11 MRI brain --pending  1/11 MRA head and neck--pending  1/11 echocardiogram--pending  Antimicrobials:   None   Subjective: All history collected through videophonic interpreter, and yet patient still very challenging historian.  Patient still feels vague malaise, uncomfortable, right arm weakness, right leg weakness, numbness in bilateral lower extremities, restlessness, dizziness, possibly confusion.      Objective: Vitals:   12/20/19 0317 12/20/19 0500 12/20/19 0708 12/20/19 1115  BP: 123/90 111/68 131/85 124/79  Pulse: 75 74 65 74  Resp: 18 18 16 16   Temp: 97.6 F (36.4 C) 97.8 F (36.6 C) 98.1 F (36.7 C) 98.4 F (36.9 C)  TempSrc: Oral Oral Oral Oral  SpO2: 97% 96% 97% 97%  Weight:      Height:        Intake/Output Summary (Last 24 hours) at 12/20/2019 1237 Last data filed at 12/20/2019 1200 Gross per 24 hour  Intake 889.6 ml  Output 900 ml  Net -10.4 ml   Filed Weights   12/19/19 1434 12/19/19 2124  Weight: 72.6 kg 69.6 kg    Examination: General appearance: Elderly adult male, alert and in mild vague distress, restless, distracted.   HEENT: Anicteric, conjunctiva pink, lids and lashes normal. No nasal deformity, discharge, epistaxis.  Lips  moist, dentition normal, oropharynx moist, no oral lesion, hearing normal.   Skin: Warm and dry.  No jaundice.  No suspicious rashes or lesions. Cardiac: RRR, nl S1-S2, no murmurs appreciated.  Capillary refill is brisk.  JVP normal.  No LE edema.  Radial pulses 2+ and symmetric. Respiratory: Normal  respiratory rate and rhythm.  CTAB without rales or wheezes. Abdomen: Abdomen soft.  No TTP or guarding. No ascites, distension, hepatosplenomegaly.   MSK: No deformities or effusions.  Normal muscle bulk and tone.  No clubbing. Neuro: Awake and alert, but distracted.  5/5 strength on the left arm and leg, 4/5 strength right arm and leg.  Unable to assess for sensation to light touch.  Right facial droop.  Slurred speech, speech is normal.  EOMI.  Psych: Sensorium intact and responding to questions, attention distracted. Affect anxious.  Judgment and insight appear slightly impaired.    Data Reviewed: I have personally reviewed following labs and imaging studies:  CBC: Recent Labs  Lab 12/19/19 1542  WBC 9.3  NEUTROABS 6.0  HGB 15.3  HCT 44.9  MCV 89.1  PLT [id number]   Basic Metabolic Panel: Recent Labs  Lab 12/19/19 1542  NA 136  K 3.5  CL 97*  CO2 24  GLUCOSE 158*  BUN 26*  CREATININE 1.37*  CALCIUM 9.5   GFR: Estimated Creatinine Clearance: 37.2 mL/min (A) (by C-G formula based on SCr of 1.37 mg/dL (H)). Liver Function Tests: Recent Labs  Lab 12/19/19 1542  AST 92*  ALT 69*  ALKPHOS 59  BILITOT 0.8  PROT 7.2  ALBUMIN 4.1   No results for input(s): LIPASE, AMYLASE in the last 168 hours. No results for input(s): AMMONIA in the last 168 hours. Coagulation Profile: No results for input(s): INR, PROTIME in the last 168 hours. Cardiac Enzymes: No results for input(s): CKTOTAL, CKMB, CKMBINDEX, TROPONINI in the last 168 hours. BNP (last 3 results) No results for input(s): PROBNP in the last 8760 hours. HbA1C: Recent Labs    12/20/19 0639  HGBA1C 8.2*   CBG: Recent Labs  Lab 12/19/19 2119 12/20/19 0739 12/20/19 1142  GLUCAP 137* 151* 219*   Lipid Profile: Recent Labs    12/20/19 0639  CHOL 258*  HDL 36*  LDLCALC 169*  TRIG 267*  CHOLHDL 7.2   Thyroid Function Tests: No results for input(s): TSH, T4TOTAL, FREET4, T3FREE, THYROIDAB in the last 72  hours. Anemia Panel: No results for input(s): VITAMINB12, FOLATE, FERRITIN, TIBC, IRON, RETICCTPCT in the last 72 hours. Urine analysis: No results found for: COLORURINE, APPEARANCEUR, LABSPEC, PHURINE, GLUCOSEU, HGBUR, BILIRUBINUR, KETONESUR, PROTEINUR, UROBILINOGEN, NITRITE, LEUKOCYTESUR Sepsis Labs: @LABRCNTIP (procalcitonin:4,lacticacidven:4)  ) Recent Results (from the past 240 hour(s))  Respiratory Panel by RT PCR (Flu A&B, Covid) - Nasopharyngeal Swab     Status: None   Collection Time: 12/19/19  5:18 PM   Specimen: Nasopharyngeal Swab  Result Value Ref Range Status   SARS Coronavirus 2 by RT PCR NEGATIVE NEGATIVE Final    Comment: (NOTE) SARS-CoV-2 target nucleic acids are NOT DETECTED. The SARS-CoV-2 RNA is generally detectable in upper respiratoy specimens during the acute phase of infection. The lowest concentration of SARS-CoV-2 viral copies this assay can detect is 131 copies/mL. A negative result does not preclude SARS-Cov-2 infection and should not be used as the sole basis for treatment or other patient management decisions. A negative result may occur with  improper specimen collection/handling, submission of specimen other than nasopharyngeal swab, presence of viral mutation(s) within the areas targeted by  this assay, and inadequate number of viral copies (<131 copies/mL). A negative result must be combined with clinical observations, patient history, and epidemiological information. The expected result is Negative. Fact Sheet for Patients:  PinkCheek.be Fact Sheet for Healthcare Providers:  GravelBags.it This test is not yet ap proved or cleared by the [place] FDA and  has been authorized for detection and/or diagnosis of SARS-CoV-2 by FDA under an Emergency Use Authorization (EUA). This EUA will remain  in effect (meaning this test can be used) for the duration of the COVID-19 declaration under  Section 564(b)(1) of the Act, 21 U.S.C. section 360bbb-3(b)(1), unless the authorization is terminated or revoked sooner.    Influenza A by PCR NEGATIVE NEGATIVE Final   Influenza B by PCR NEGATIVE NEGATIVE Final    Comment: (NOTE) The Xpert Xpress SARS-CoV-2/FLU/RSV assay is intended as an aid in  the diagnosis of influenza from Nasopharyngeal swab specimens and  should not be used as a sole basis for treatment. Nasal washings and  aspirates are unacceptable for Xpert Xpress SARS-CoV-2/FLU/RSV  testing. Fact Sheet for Patients: PinkCheek.be Fact Sheet for Healthcare Providers: GravelBags.it This test is not yet approved or cleared by the [place] FDA and  has been authorized for detection and/or diagnosis of SARS-CoV-2 by  FDA under an Emergency Use Authorization (EUA). This EUA will remain  in effect (meaning this test can be used) for the duration of the  Covid-19 declaration under Section 564(b)(1) of the Act, 21  U.S.C. section 360bbb-3(b)(1), unless the authorization is  terminated or revoked. Performed at [facility], [address] [place],  [address]          Radiology Studies: CT Head Wo Contrast  Result Date: 12/19/2019 CLINICAL DATA:  Right-sided weakness EXAM: CT HEAD WITHOUT CONTRAST TECHNIQUE: Contiguous axial images were obtained from the base of the skull through the vertex without intravenous contrast. COMPARISON:  None. FINDINGS: Brain: There is no acute intracranial hemorrhage, mass-effect, or edema. Gray-white differentiation is preserved. There is no extra-axial fluid collection. Ventricles and sulci are within normal limits in size and configuration. Patchy hypoattenuation in the supratentorial white matter is nonspecific but may reflect mild chronic microvascular ischemic changes. Vascular: There is atherosclerotic calcification at the skull base. Skull: Calvarium is unremarkable.  Sinuses/Orbits: Focal opacification of a left ethmoid air cell. Orbits are unremarkable. Other: Mastoid air cells are clear. IMPRESSION: No acute intracranial hemorrhage, mass effect, or evidence of acute infarction. Mild chronic microvascular ischemic changes. Electronically Signed   By: Macy Mis M.D.   On: 12/19/2019 16:09   MR ANGIO HEAD WO CONTRAST  Result Date: 12/20/2019 CLINICAL DATA:  Right-sided weakness, facial asymmetry EXAM: MRI HEAD WITHOUT CONTRAST MRA HEAD WITHOUT CONTRAST MRA NECK WITHOUT AND WITH CONTRAST TECHNIQUE: Multiplanar, multiecho pulse sequences of the brain and surrounding structures were obtained without intravenous contrast. Angiographic images of the Circle of Willis were obtained using MRA technique without intravenous contrast. Angiographic images of the neck were obtained using MRA technique without and with intravenous contrast. Carotid stenosis measurements (when applicable) are obtained utilizing NASCET criteria, using the distal internal carotid diameter as the denominator. CONTRAST:  84mL GADAVIST GADOBUTROL 1 MMOL/ML IV SOLN COMPARISON:  None. FINDINGS: MRI HEAD FINDINGS Brain: There is reduced diffusion in the parasagittal left pons. No evidence of hemorrhage. Patchy T2 hyperintensity in the supratentorial white matter is nonspecific but may reflect mild chronic microvascular ischemic changes. Ventricles and sulci are within normal limits in size and configuration. There is no  intracranial mass, mass effect, hydrocephalus, or extra-axial fluid collection. Vascular: Major vessel flow voids at the skull base are preserved. Skull and upper cervical spine: Marrow signal is unremarkable. Sinuses/Orbits: No acute abnormality. Other: None. MRA HEAD FINDINGS Intracranial internal carotid arteries are patent. Middle and anterior cerebral arteries are patent. Intracranial vertebral arteries, basilar artery, posterior cerebral arteries are patent. A left posterior communicating  artery is present. There is no significant stenosis or aneurysm. MRA NECK FINDINGS Common, internal, and external carotid arteries are patent. Extracranial vertebral arteries are patent. No hemodynamically significant stenosis or evidence of dissection. 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 No hemodynamically significant stenosis or evidence of dissection. Electronically Signed   By: Macy Mis M.D.   On: 12/20/2019 12:19   MR ANGIO NECK W WO CONTRAST  Result Date: 12/20/2019 CLINICAL DATA:  Right-sided weakness, facial asymmetry EXAM: MRI HEAD WITHOUT CONTRAST MRA HEAD WITHOUT CONTRAST MRA NECK WITHOUT AND WITH CONTRAST TECHNIQUE: Multiplanar, multiecho pulse sequences of the brain and surrounding structures were obtained without intravenous contrast. Angiographic images of the Circle of Willis were obtained using MRA technique without intravenous contrast. Angiographic images of the neck were obtained using MRA technique without and with intravenous contrast. Carotid stenosis measurements (when applicable) are obtained utilizing NASCET criteria, using the distal internal carotid diameter as the denominator. CONTRAST:  90mL GADAVIST GADOBUTROL 1 MMOL/ML IV SOLN COMPARISON:  None. FINDINGS: MRI HEAD FINDINGS Brain: There is reduced diffusion in the parasagittal left pons. No evidence of hemorrhage. Patchy T2 hyperintensity in the supratentorial white matter is nonspecific but may reflect mild chronic microvascular ischemic changes. Ventricles and sulci are within normal limits in size and configuration. There is no intracranial mass, mass effect, hydrocephalus, or extra-axial fluid collection. Vascular: Major vessel flow voids at the skull base are preserved. Skull and upper cervical spine: Marrow signal is unremarkable. Sinuses/Orbits: No acute abnormality. Other: None. MRA HEAD FINDINGS Intracranial internal carotid arteries are patent.  Middle and anterior cerebral arteries are patent. Intracranial vertebral arteries, basilar artery, posterior cerebral arteries are patent. A left posterior communicating artery is present. There is no significant stenosis or aneurysm. MRA NECK FINDINGS Common, internal, and external carotid arteries are patent. Extracranial vertebral arteries are patent. No hemodynamically significant stenosis or evidence of dissection. 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 No hemodynamically significant stenosis or evidence of dissection. Electronically Signed   By: Macy Mis M.D.   On: 12/20/2019 12:19   MR BRAIN WO CONTRAST  Result Date: 12/20/2019 CLINICAL DATA:  Right-sided weakness, facial asymmetry EXAM: MRI HEAD WITHOUT CONTRAST MRA HEAD WITHOUT CONTRAST MRA NECK WITHOUT AND WITH CONTRAST TECHNIQUE: Multiplanar, multiecho pulse sequences of the brain and surrounding structures were obtained without intravenous contrast. Angiographic images of the Circle of Willis were obtained using MRA technique without intravenous contrast. Angiographic images of the neck were obtained using MRA technique without and with intravenous contrast. Carotid stenosis measurements (when applicable) are obtained utilizing NASCET criteria, using the distal internal carotid diameter as the denominator. CONTRAST:  60mL GADAVIST GADOBUTROL 1 MMOL/ML IV SOLN COMPARISON:  None. FINDINGS: MRI HEAD FINDINGS Brain: There is reduced diffusion in the parasagittal left pons. No evidence of hemorrhage. Patchy T2 hyperintensity in the supratentorial white matter is nonspecific but may reflect mild chronic microvascular ischemic changes. Ventricles and sulci are within normal limits in size and configuration. There is no intracranial mass, mass effect, hydrocephalus, or extra-axial fluid collection. Vascular:  Major vessel flow voids at the skull base are preserved. Skull and upper cervical  spine: Marrow signal is unremarkable. Sinuses/Orbits: No acute abnormality. Other: None. MRA HEAD FINDINGS Intracranial internal carotid arteries are patent. Middle and anterior cerebral arteries are patent. Intracranial vertebral arteries, basilar artery, posterior cerebral arteries are patent. A left posterior communicating artery is present. There is no significant stenosis or aneurysm. MRA NECK FINDINGS Common, internal, and external carotid arteries are patent. Extracranial vertebral arteries are patent. No hemodynamically significant stenosis or evidence of dissection. 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 No hemodynamically significant stenosis or evidence of dissection. Electronically Signed   By: Macy Mis M.D.   On: 12/20/2019 12:19   CT ABDOMEN PELVIS W CONTRAST  Result Date: 12/20/2019 CLINICAL DATA:  Abdominal pain with vomiting and diarrhea EXAM: CT ABDOMEN AND PELVIS WITH CONTRAST TECHNIQUE: Multidetector CT imaging of the abdomen and pelvis was performed using the standard protocol following bolus administration of intravenous contrast. CONTRAST:  123mL OMNIPAQUE IOHEXOL 300 MG/ML  SOLN COMPARISON:  None. FINDINGS: LOWER CHEST: Cardiomegaly and bibasilar atelectasis. HEPATOBILIARY: Diffuse hypoattenuation of the liver relative to the spleen suggests hepatic steatosis. No focal liver lesion or biliary dilatation. Normal gallbladder. PANCREAS: Normal pancreas. No ductal dilatation or peripancreatic fluid collection. SPLEEN: Normal. ADRENALS/URINARY TRACT: The adrenal glands are normal. No hydronephrosis, nephroureterolithiasis or solid renal mass. The urinary bladder is normal for degree of distention STOMACH/BOWEL: There is no hiatal hernia. Normal duodenal course and caliber. No small bowel dilatation or inflammation. No focal colonic abnormality. Normal appendix. VASCULAR/LYMPHATIC: There is calcific atherosclerosis of the  abdominal aorta. No abdominal or pelvic lymphadenopathy. REPRODUCTIVE: Normal prostate size with symmetric seminal vesicles. MUSCULOSKELETAL. No bony spinal canal stenosis or focal osseous abnormality. OTHER: None. IMPRESSION: 1. No acute abnormality of the abdomen or pelvis. 2. Hepatic steatosis. 3. Aortic Atherosclerosis (ICD10-I70.0). Electronically Signed   By: Ulyses Jarred M.D.   On: 12/20/2019 01:13        Scheduled Meds: . aspirin EC  81 mg Oral Daily  . atorvastatin  40 mg Oral q1800  . [START ON 12/21/2019] influenza vaccine adjuvanted  0.5 mL Intramuscular Tomorrow-1000  . insulin aspart  0-5 Units Subcutaneous QHS  . insulin aspart  0-9 Units Subcutaneous TID WC  . [START ON 12/21/2019] pneumococcal 23 valent vaccine  0.5 mL Intramuscular Tomorrow-1000   Continuous Infusions:   LOS: 0 days    Time spent: 35 minutes    Edwin Dada, MD [facility] 12/20/2019, 12:37 PM     Please page though [place] or Epic secure chat:  For [employer], Adult nurse

## 2019-12-20 NOTE — Progress Notes (Signed)
Inpatient Rehabilitation-Admissions Coordinator   Attempted to call pt's room phone with interpreter, however unable to reach patient x3 attempts. I have reached out to his RN to ensure phone nearby and to expect call. Will continue attempts to follow up with pt tomorrow to discuss CIR program and determine interest.   Please call if question.   Raechel Ache, OTR/L  Rehab Admissions Coordinator  [phone] 12/20/2019 4:45 PM

## 2019-12-21 DIAGNOSIS — N1831 Chronic kidney disease, stage 3a: Secondary | ICD-10-CM | POA: Diagnosis not present

## 2019-12-21 DIAGNOSIS — Z20822 Contact with and (suspected) exposure to covid-19: Secondary | ICD-10-CM | POA: Diagnosis not present

## 2019-12-21 DIAGNOSIS — G8191 Hemiplegia, unspecified affecting right dominant side: Secondary | ICD-10-CM | POA: Diagnosis not present

## 2019-12-21 DIAGNOSIS — I639 Cerebral infarction, unspecified: Secondary | ICD-10-CM | POA: Diagnosis not present

## 2019-12-21 DIAGNOSIS — E1122 Type 2 diabetes mellitus with diabetic chronic kidney disease: Secondary | ICD-10-CM | POA: Diagnosis not present

## 2019-12-21 DIAGNOSIS — K760 Fatty (change of) liver, not elsewhere classified: Secondary | ICD-10-CM | POA: Diagnosis not present

## 2019-12-21 DIAGNOSIS — R531 Weakness: Secondary | ICD-10-CM | POA: Diagnosis present

## 2019-12-21 DIAGNOSIS — E1143 Type 2 diabetes mellitus with diabetic autonomic (poly)neuropathy: Secondary | ICD-10-CM | POA: Diagnosis not present

## 2019-12-21 DIAGNOSIS — K3184 Gastroparesis: Secondary | ICD-10-CM | POA: Diagnosis not present

## 2019-12-21 DIAGNOSIS — R29703 NIHSS score 3: Secondary | ICD-10-CM | POA: Diagnosis not present

## 2019-12-21 DIAGNOSIS — I447 Left bundle-branch block, unspecified: Secondary | ICD-10-CM | POA: Diagnosis not present

## 2019-12-21 DIAGNOSIS — G9349 Other encephalopathy: Secondary | ICD-10-CM | POA: Diagnosis not present

## 2019-12-21 DIAGNOSIS — E1165 Type 2 diabetes mellitus with hyperglycemia: Secondary | ICD-10-CM | POA: Diagnosis not present

## 2019-12-21 DIAGNOSIS — N183 Chronic kidney disease, stage 3 unspecified: Secondary | ICD-10-CM

## 2019-12-21 DIAGNOSIS — E785 Hyperlipidemia, unspecified: Secondary | ICD-10-CM | POA: Diagnosis not present

## 2019-12-21 DIAGNOSIS — I129 Hypertensive chronic kidney disease with stage 1 through stage 4 chronic kidney disease, or unspecified chronic kidney disease: Secondary | ICD-10-CM | POA: Diagnosis not present

## 2019-12-21 DIAGNOSIS — Z23 Encounter for immunization: Secondary | ICD-10-CM | POA: Diagnosis not present

## 2019-12-21 DIAGNOSIS — R471 Dysarthria and anarthria: Secondary | ICD-10-CM | POA: Diagnosis not present

## 2019-12-21 DIAGNOSIS — R29810 Facial weakness: Secondary | ICD-10-CM | POA: Diagnosis not present

## 2019-12-21 LAB — GLUCOSE, CAPILLARY
Glucose-Capillary: 161 mg/dL — ABNORMAL HIGH (ref 70–99)
Glucose-Capillary: 209 mg/dL — ABNORMAL HIGH (ref 70–99)
Glucose-Capillary: 126 mg/dL — ABNORMAL HIGH (ref 70–99)
Glucose-Capillary: 273 mg/dL — ABNORMAL HIGH (ref 70–99)

## 2019-12-21 MED ORDER — GLUCERNA SHAKE PO LIQD
237.0000 mL | Freq: Two times a day (BID) | ORAL | Status: DC
  Administered 2019-12-21 – 2019-12-22 (×2): 237 mL via ORAL

## 2019-12-21 MED ORDER — ADULT MULTIVITAMIN W/MINERALS CH
1.0000 | ORAL_TABLET | Freq: Every day | ORAL | Status: DC
  Administered 2019-12-21 – 2019-12-22 (×2): 1 via ORAL
  Filled 2019-12-21 (×2): qty 1

## 2019-12-21 NOTE — Clinical Social Work Note (Signed)
Spoke with Raechel Ache with CIR. [facility] will call LCSW back tomorrow morning to identify if they have beds available. Advised that review of insurance identified that patient has Medicare Part B. TOC will follow up with [facility] 12/22/19 to identify possible admission.    Japhet Morgenthaler, Clydene Pugh, LCSW

## 2019-12-21 NOTE — Progress Notes (Signed)
Inpatient Rehabilitation-Admissions Coordinator   Spoke with pt and his son Roselie Awkward via phone (with interpreter Spero Geralds present on my end to assist with pt's preferred language). We discussed the recommended CIR program, expectations, anticipated LOS, and anticipated assistance needed at DC. Feel pt is an appropriate candidate for CIR at this time. Pt and his son would like to pursue CIR for post acute rehab. They understand it is an inpatient rehab program in [place] at [place]. I have confirmed DC support from his family.   I do not have a bed available today for this patient. Will follow up once bed becomes available.   Please call if questions.   Raechel Ache, OTR/L  Rehab Admissions Coordinator  [phone] 12/21/2019 11:36 AM

## 2019-12-21 NOTE — Progress Notes (Signed)
Echo reviewed, normal LVEF without significant WMAs. No further inpatient workup planned as inpatient for his LBBB, will arrange outpatient follow up and likely lexiscan at that time once he has recovered from his stroke. We will sign off inpatient care, he already has f/u arranged.    Carlyle Dolly MD

## 2019-12-21 NOTE — PMR Pre-admission (Signed)
PMR Admission Coordinator Pre-Admission Assessment  [name] is an 79 y.o., male MRN: [id number] DOB: Jan 22, 1941 Height: 5' 4"  (162.6 cm) Weight: 69.6 kg  Insurance Information HMO:     PPO:      PCP:      IPA:      80/20:      OTHER:  PRIMARY: Medicaid [place]      Policy#: [id number]      Subscriber: Patient Coverage Code: MAACY CM Name:       Phone#:      Fax#:  Pre-Cert#:       Employer:  Benefits:  Phone #: [phone]     Name: verified via [employer] on 12/21/19 Eff. Date: verified eligibility on 12/21/19     Deduct:       Out of Pocket Max:       Life Max:  CIR: covered per Medicaid guidelines      SNF:  Outpatient:      Co-Pay:  Home Health:       Co-Pay:  DME:      Co-Pay:  Providers:  SECONDARY: Medicare part B only      Policy#: [id number]      Subscriber: Patient CM Name:       Phone#:      Fax#:  Pre-Cert#:       Employer:  Benefits:  Phone #: online     Name: verified eligibility online via [employer] on 12/21/19 Eff. Date: part B effective 04/08/2014     Deduct:       Out of Pocket Max:       Life Max:  CIR:       SNF:  Outpatient:      Co-Pay:  Home Health:       Co-Pay:  DME:      Co-Pay:   Medicaid Application Date:       Case Manager:  Disability Application Date:       Case Worker:   The "Data Collection Information Summary" for patients in Inpatient Rehabilitation Facilities with attached "Privacy Act [place] Records" was provided and verbally reviewed with: Patient and Family  Emergency Contact Information Contact Information    Name Relation Home Work Mobile   Sanchez,[name] Daughter [phone]        Current Medical History  Patient Admitting Diagnosis: 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 He presented to [facility] ED on 1/10 after experiencing right arm weakness. He had reportedly been having body  aches, dizziness, nausea, weakness, and trouble walking on 1/5 with right arm weakness persisting since then while his other symptoms resolved. He had an NIHSS of 3 and CT head was negative. Tele-neuro was consulted. MRI of the brain showed an acute infarct involving the left pontine area. tPA was not given as he was out of the therapeutic window. Cardiology was consulted as he was found to have a new LBBB on EKG. Troponin negative and pt was without chest pain. Plan is for outpatient cardiology follow up once he has recovered from his stroke. Therapies have evaluated pt with recommendations for CIR. Pt is to admit to CIR on 12/22/2019.    Patient's medical record from [facility] has been reviewed by the rehabilitation admission coordinator and physician.  Past Medical History  Past Medical History:  Diagnosis Date  . Hyperlipemia   . Hypertension  Family History   family history is not on file.  Prior Rehab/Hospitalizations Has the patient had prior rehab or hospitalizations prior to admission? No  Has the patient had major surgery during 100 days prior to admission? No   Current Medications  Current Facility-Administered Medications:  .  acetaminophen (TYLENOL) tablet 650 mg, 650 mg, Oral, Q6H PRN, 650 mg at 12/22/19 1029 **OR** acetaminophen (TYLENOL) suppository 650 mg, 650 mg, Rectal, Q6H PRN, Emokpae, Ejiroghene E, MD .  aspirin EC tablet 162 mg, 162 mg, Oral, Daily, [place], Kofi, MD, 162 mg at 12/22/19 1030 .  atorvastatin (LIPITOR) tablet 40 mg, 40 mg, Oral, q1800, Emokpae, Ejiroghene E, MD, 40 mg at 12/21/19 1725 .  feeding supplement (GLUCERNA SHAKE) (GLUCERNA SHAKE) liquid 237 mL, 237 mL, Oral, BID BM, Barton Dubois, MD, 237 mL at 12/22/19 1031 .  insulin aspart (novoLOG) injection 0-5 Units, 0-5 Units, Subcutaneous, QHS, Emokpae, Ejiroghene E, MD, 3 Units at 12/21/19 2238 .  insulin aspart (novoLOG) injection 0-9 Units, 0-9 Units, Subcutaneous, TID WC, Emokpae,  Ejiroghene E, MD, 2 Units at 12/22/19 0825 .  labetalol (NORMODYNE) injection 10 mg, 10 mg, Intravenous, Q2H PRN, Emokpae, Ejiroghene E, MD .  multivitamin with minerals tablet 1 tablet, 1 tablet, Oral, Daily, Barton Dubois, MD, 1 tablet at 12/22/19 1029 .  ondansetron (ZOFRAN) tablet 4 mg, 4 mg, Oral, Q6H PRN **OR** ondansetron (ZOFRAN) injection 4 mg, 4 mg, Intravenous, Q6H PRN, Emokpae, Ejiroghene E, MD .  polyethylene glycol (MIRALAX / GLYCOLAX) packet 17 g, 17 g, Oral, Daily PRN, Emokpae, Ejiroghene E, MD .  sodium chloride flush (NS) 0.9 % injection 3 mL, 3 mL, Intravenous, PRN, Emokpae, Ejiroghene E, MD  Patients Current Diet:  Diet Order            Diet heart healthy/carb modified Room service appropriate? Yes; Fluid consistency: Thin  Diet effective now              Precautions / Restrictions Precautions Precautions: Fall Restrictions Weight Bearing Restrictions: No   Has the patient had 2 or more falls or a fall with injury in the past year? No  Prior Activity Level Community (5-7x/wk): active, no AD use. Retired and does not drive but gets out of the house whenever he wants and gets rides from family.   Prior Functional Level Self Care: Did the patient need help bathing, dressing, using the toilet or eating? Independent  Indoor Mobility: Did the patient need assistance with walking from room to room (with or without device)? Independent  Stairs: Did the patient need assistance with internal or external stairs (with or without device)? Independent  Functional Cognition: Did the patient need help planning regular tasks such as shopping or remembering to take medications? Independent  Home Assistive Devices / Equipment Home Assistive Devices/Equipment: None Home Equipment: Cane - single point  Prior Device Use: Indicate devices/aids used by the patient prior to current illness, exacerbation or injury? None of the above  Current Functional Level Cognition  Overall  Cognitive Status: Within Functional Limits for tasks assessed Orientation Level: Oriented to person, Oriented to place, Oriented to situation General Comments: Requires use of interpreter Attention: Focused Focused Attention: Appears intact Memory: Impaired Awareness: Appears intact Problem Solving: Impaired    Extremity Assessment (includes Sensation/Coordination)  Upper Extremity Assessment: RUE deficits/detail RUE Deficits / Details: shoulder strength 3-/5, elbow and wrist 3/5, decreased grip and pinch strength. Able to oppose to index finger only, poor thumb extension and abduction RUE Sensation: decreased light touch,  decreased proprioception RUE Coordination: decreased fine motor, decreased gross motor  Lower Extremity Assessment: Defer to PT evaluation RLE Deficits / Details: grossly 3+/5 RLE Sensation: decreased proprioception, decreased light touch RLE Coordination: decreased fine motor, decreased gross motor LLE Deficits / Details: grossly 5/5 LLE Sensation: WNL LLE Coordination: WNL    ADLs  Overall ADL's : Needs assistance/impaired Eating/Feeding: Set up, Bed level Eating/Feeding Details (indicate cue type and reason): Assist for opening packages and cutting food Grooming: Oral care Grooming Details (indicate cue type and reason): Pt seated at EOB for teeth brushing, attempted to use RUE as assist to open toothpaste however was unable to complete. OT assisted with opening, pt then held toothbrush with right hand in lateral pinch to put paste on the brush. Used left hand to brush teeth.  Upper Body Dressing : Moderate assistance, Sitting Upper Body Dressing Details (indicate cue type and reason): OT assisting with managing gown ties at back of neck Lower Body Dressing: Maximal assistance, Sitting/lateral leans Lower Body Dressing Details (indicate cue type and reason): pt unable to successfully use RUE to assist with dressing due to weakness and coordination deficits. Also  with balance deficits when seated and attempting to bend forward to reach legs/feet General ADL Comments: Pt requiring increased assistance with ADLs due to right hemiplegia    Mobility  Overal bed mobility: Needs Assistance Bed Mobility: Supine to Sit Supine to sit: Min assist Sit to supine: Supervision General bed mobility comments: increased time, requires most assistance for scooting forward at EOB    Transfers  Overall transfer level: Needs assistance Equipment used: Rolling walker (2 wheeled), 1 person hand held assist Transfers: Sit to/from Stand, Stand Pivot Transfers Sit to Stand: Min assist, Mod assist Stand pivot transfers: Mod assist  Lateral/Scoot Transfers: Mod assist General transfer comment: Has difficulty balancing on RLE due to weakness, drags right foot when taking steps    Ambulation / Gait / Stairs / Wheelchair Mobility  Ambulation/Gait Ambulation/Gait assistance: Mod assist Gait Distance (Feet): 30 Feet Assistive device: Rolling walker (2 wheeled) Gait Pattern/deviations: Decreased step length - right, Decreased stance time - right, Decreased stride length, Decreased dorsiflexion - right General Gait Details: slow labored cadence with diffiuclty advancing RLE due to dragging of toe secondary to limited ankle dorsiflexion, frequently requires tactile cueing to help advance RLE, followed with wheelchair for safety Gait velocity: decreased    Posture / Balance Dynamic Sitting Balance Sitting balance - Comments: fair/good seated at EOB Balance Overall balance assessment: Needs assistance Sitting-balance support: Feet supported, No upper extremity supported Sitting balance-Leahy Scale: Fair Sitting balance - Comments: fair/good seated at EOB Standing balance support: During functional activity, Bilateral upper extremity supported Standing balance-Leahy Scale: Poor Standing balance comment: fair/poor using RW    Special needs/care consideration BiPAP/CPAP :  no CPM : no Continuous Drip IV : no Dialysis : no        Days : no Life Vest : no Oxygen : no, on RA Special Bed : no Trach Size : no Wound Vac (area) : no      Location : no Skin: no areas of concern                  Bowel mgmt: last BM 12/20/19, incontinent Bladder mgmt: continent  Diabetic mgmt: yes Behavioral consideration : no Chemo/radiation : no   Previous Home Environment (from acute therapy documentation) Living Arrangements: Children  Lives With: Daughter Available Help at Discharge: Family, Available PRN/intermittently Type of Home: House  Home Layout: One level Home Access: Stairs to enter Entrance Stairs-Rails: Right Entrance Stairs-Number of Steps: 6 Bathroom Accessibility: Yes [place]: No  Discharge Living Setting Plans for Discharge Living Setting: Patient's home, Lives with (comment)(lives with his daugther Francisca) Type of Home at Discharge: House Discharge Home Layout: One level Discharge Home Access: Stairs to enter Entrance Stairs-Rails: None Entrance Stairs-Number of Steps: 1 Discharge Bathroom Shower/Tub: Walk-in shower Discharge Bathroom Toilet: Standard Discharge Bathroom Accessibility: Yes How Accessible: Accessible via walker Does the patient have any problems obtaining your medications?: No  Social/Family/Support Systems Patient Roles: Other (Comment)(lives with daugther, does odd jobs around the house.) Contact Information: Roselie Awkward (son): [phone] Anticipated Caregiver: *6 grown children to assist. Per Roselie Awkward, his wife will stay with him (or the patient's neice Liliana) when his daugther is not home and at work (his daugther works 2pm-3am).  Anticipated Caregiver's Contact Information: see above Ability/Limitations of Caregiver: Min A Caregiver Availability: 24/7 Discharge Plan Discussed with Primary Caregiver: Yes(his son Roselie Awkward) Is Caregiver In Agreement with Plan?: Yes Does Caregiver/Family have Issues with  Lodging/Transportation while Pt is in Rehab?: No  Goals/Additional Needs Patient/Family Goal for Rehab: PT/OT: Supervision; SLP: Mod I Expected length of stay: 7-10 days Cultural Considerations: Spanish speaking Dietary Needs: heart healthy/carb modified, thin liquids Equipment Needs: TBD Special Service Needs: Intrepter: Spanish Pt/Family Agrees to Admission and willing to participate: Yes Program Orientation Provided & Reviewed with Pt/Caregiver Including Roles  & Responsibilities: Yes(pt and his son Roselie Awkward )  Barriers to Discharge: Home environment access/layout  Barriers to Discharge Comments: one step to enter  Decrease burden of Care through IP rehab admission: NA  Possible need for SNF placement upon discharge: Not anticipated. Pt has good family support at DC and has a good prognosis for further progress through CIR.   Patient Condition: I have reviewed medical records from [facility], spoken with RN, and patient and son. I met with patient at the bedside for inpatient rehabilitation assessment.  Patient will benefit from ongoing PT, OT and SLP, can actively participate in 3 hours of therapy a day 5 days of the week, and can make measurable gains during the admission.  Patient will also benefit from the coordinated team approach during an Inpatient Acute Rehabilitation admission.  The patient will receive intensive therapy as well as Rehabilitation physician, nursing, social worker, and care management interventions.  Due to safety, skin/wound care, disease management, medication administration, pain management and patient education the patient requires 24 hour a day rehabilitation nursing.  The patient is currently Mod A  with mobility and Mod/Max A for basic ADLs.  Discharge setting and therapy post discharge at home with home health is anticipated.  Patient has agreed to participate in the Acute Inpatient Rehabilitation Program and will admit 12/22/19.  Preadmission Screen  Completed By:  Raechel Ache, 12/22/2019 11:03 AM ______________________________________________________________________   Discussed status with Dr. Ranell Patrick on 12/22/19 at 11:02AM and received approval for admission today.  Admission Coordinator:  Raechel Ache, OT, time 11:02AM/Date 12/22/19   Assessment/Plan: Diagnosis: Left pons acute stroke 1. Does the need for close, 24 hr/day Medical supervision in concert with the patient's rehab needs make it unreasonable for this patient to be served in a less intensive setting? Yes 2. Co-Morbidities requiring supervision/potential complications: CKD3, nausea, New LBBB, HLD 3. Due to bladder management, bowel management, safety, skin/wound care, disease management, medication administration, pain management and patient education, does the patient require 24 hr/day rehab nursing? Yes and No 4. Does  the patient require coordinated care of a physician, rehab nurse, PT, OT, and SLP to address physical and functional deficits in the context of the above medical diagnosis(es)? Yes Addressing deficits in the following areas: balance, endurance, locomotion, strength, transferring, bowel/bladder control, bathing, dressing, feeding, grooming, toileting, cognition, speech and language 5. Can the patient actively participate in an intensive therapy program of at least 3 hrs of therapy 5 days a week? Yes 6. The potential for patient to make measurable gains while on inpatient rehab is excellent 7. Anticipated functional outcomes upon discharge from inpatient rehab: modified independent PT, modified independent OT, independent SLP 8. Estimated rehab length of stay to reach the above functional goals is: 10-14 days 9. Anticipated discharge destination: Home 10. Overall Rehab/Functional Prognosis: excellent   MD Signature: Leeroy Cha, MD

## 2019-12-21 NOTE — Progress Notes (Signed)
Physical Therapy Treatment Patient Details Name: Eugene Cooper MRN: [id number] DOB: 19-Jun-1941 Today's Date: 12/21/2019    History of Present Illness Eugene Cooper is a 79 y.o. male with medical history significant for hypertension, dyslipidemia.  Patient is Spanish-speaking, history is obtained with the help of an tele- interpreter.  Patient presented to the ED with complaints of weakness with his right upper extremity and right lower extremity weakness with resultant inability to walk.  He also reports change in his voice, but is unable to confirm facial asymmetry.  He tells me symptoms started 2 to 3 days ago.he also reports he was having multiple episodes of vomiting that day that lasted just 1 day, and loose stools of 2 days duration.  Reported diffuse abdominal aches, generalized body aches, pain in his ankles also.  No specific complaints of chest pain, he is unable to tell me specifically if he has had any difficulty breathing.  After vomiting stopped, his abdominal pain has persisted to today, also today right-sided weakness hence he presented to the ED today.Denies current alcohol intake.  Denies Covid positive contacts.    PT Comments    Patient demonstrated increased endurance/distance for ambulation, had difficulty advancing RLE due to weakness, limited ankle dorsiflexion with frequent dragging of right foot, requires tactile assistance to help advance RLE most of time, occasionally can advance on his own with much reliance on using RW.  Patient demonstrated poor carryover for attempting heel raises seated at bedside and required active assistance to complete exercises with RLE due to weakness and poor coordination.  Patient tolerated sitting up in chair for speech therapy after physical therapy - his son present in room.  Patient will benefit from continued physical therapy in hospital and recommended venue below to increase strength, balance, endurance for safe ADLs and gait.    Follow Up Recommendations  CIR     Equipment Recommendations  Rolling walker with 5" wheels    Recommendations for Other Services       Precautions / Restrictions Precautions Precautions: Fall Restrictions Weight Bearing Restrictions: No    Mobility  Bed Mobility Overal bed mobility: Needs Assistance Bed Mobility: Supine to Sit     Supine to sit: Min assist     General bed mobility comments: increased time, requires most assistance for scooting forward at EOB  Transfers Overall transfer level: Needs assistance Equipment used: Rolling walker (2 wheeled);1 person hand held assist Transfers: Sit to/from [facility] Sit to Stand: Min assist;Mod assist Stand pivot transfers: Mod assist       General transfer comment: Has difficulty balancing on RLE due to weakness, drags right foot when taking steps  Ambulation/Gait Ambulation/Gait assistance: Mod assist Gait Distance (Feet): 30 Feet Assistive device: Rolling walker (2 wheeled) Gait Pattern/deviations: Decreased step length - right;Decreased stance time - right;Decreased stride length;Decreased dorsiflexion - right Gait velocity: decreased   General Gait Details: slow labored cadence with diffiuclty advancing RLE due to dragging of toe secondary to limited ankle dorsiflexion, frequently requires tactile cueing to help advance RLE, followed with wheelchair for safety   Stairs             Wheelchair Mobility    Modified Rankin (Stroke Patients Only)       Balance Overall balance assessment: Needs assistance Sitting-balance support: Feet supported;No upper extremity supported Sitting balance-Leahy Scale: Fair Sitting balance - Comments: fair/good seated at EOB   Standing balance support: During functional activity;Bilateral upper extremity supported Standing balance-Leahy Scale: Poor Standing balance comment:  fair/poor using RW                            Cognition  Arousal/Alertness: Awake/alert Behavior During Therapy: WFL for tasks assessed/performed Overall Cognitive Status: Within Functional Limits for tasks assessed                                 General Comments: Requires use of interpreter      Exercises General Exercises - Lower Extremity Long Arc Quad: Seated;AROM;AAROM;Strengthening;Both;10 reps Hip Flexion/Marching: Seated;AAROM;AROM;Strengthening;Both;10 reps Toe Raises: Seated;AROM;Strengthening;Both;10 reps    General Comments        Pertinent Vitals/Pain Pain Assessment: Faces Faces Pain Scale: Hurts a little bit Pain Location: right sided numbness Pain Intervention(s): Limited activity within patient's tolerance;Monitored during session    Home Living     Available Help at Discharge: Family;Available PRN/intermittently Type of Home: House              Prior Function            PT Goals (current goals can now be found in the care plan section) Acute Rehab PT Goals Patient Stated Goal: return home after rehab PT Goal Formulation: With patient Time For Goal Achievement: 01/03/20 Potential to Achieve Goals: Good Progress towards PT goals: Progressing toward goals    Frequency    7X/week      PT Plan Current plan remains appropriate    Co-evaluation              AM-PAC PT "6 Clicks" Mobility   Outcome Measure  Help needed turning from your back to your side while in a flat bed without using bedrails?: A Little Help needed moving from lying on your back to sitting on the side of a flat bed without using bedrails?: A Little Help needed moving to and from a bed to a chair (including a wheelchair)?: A Lot Help needed standing up from a chair using your arms (e.g., wheelchair or bedside chair)?: A Lot Help needed to walk in hospital room?: A Lot Help needed climbing 3-5 steps with a railing? : Total 6 Click Score: 13    End of Session Equipment Utilized During Treatment: Gait  belt Activity Tolerance: Patient tolerated treatment well;Patient limited by fatigue Patient left: in chair;with call bell/phone within reach;with chair alarm set;with family/visitor present Nurse Communication: Mobility status PT Visit Diagnosis: Unsteadiness on feet (R26.81);Other abnormalities of gait and mobility (R26.89);Muscle weakness (generalized) (M62.81)     Time: [id number] PT Time Calculation (min) (ACUTE ONLY): 36 min  Charges:  $Gait Training: 8-22 mins $Therapeutic Exercise: 8-22 mins                     12:36 PM, 12/21/19 Lonell Grandchild, MPT Physical Therapist with [facility] [phone] office [phone] mobile phone

## 2019-12-21 NOTE — Progress Notes (Signed)
PROGRESS NOTE    Eugene Cooper  [id number] DOB: 10-Jul-1941 DOA: 12/19/2019 PCP: Arnoldo Lenis, MD      Brief Narrative:  Eugene Cooper is a 79 y.o. M with HTN, DM, and hyperlipidemia who presented with few days N/V/D and abdominal pain and then acute right hemiparesis.  Evidently, patient had had a few days of nausea and vomiting and malaise, as well as right arm weakness.  By the day of admission, his abdominal symptoms improved, but his right hemiparesis persisted, he couldn't walk, so came to ER.  In the ER, CT head normal.  Appeared still weak on the right side.  Tele-neuro were consulted, did not recommend tPA given symptoms present >24 hours.      Assessment & Plan:  Left pons acute ischemic stroke -Non-invasive angiography showed no significant stenosis -Echocardiogram ordered and results as mentioned below; no source of thrombi appreciated. -Carotid imaging showed no stenoses on MRA neck   -Lipids ordered: Continue Lipitor 40 mg now for better control of his cholesterol. -After case discussed with neurology recommendation-given 462 mg of aspirin on daily basis for secondary prevention. -No telemetry abnormalities appreciated.  Continue monitoring while inpatient. -tPA not given because symptoms present >24 hours (out of therapeutic window). -PT eval ordered: recommended CIR -Outpatient follow-up with neurology in 4 weeks.  Hypertension -Allowing permissive hypertension in the setting of acute stroke -Slowly resume home antihypertensive regimen. -Heart healthy diet discussed with patient.  Diabetes -Glucose fairly well controlled -Continue sliding scale insulin while inpatient -Continue holding oral hypoglycemic agents.   -Modified carbohydrate diet has been encouraged.  CKD IIIa -Stable and at baseline at this moment. -Advised to maintain adequate hydration -Recommend repeat of basic metabolic panel intermittently to follow creatinine trend.     N/V/D -CT abdomen unremarkable except for fatty liver.   -These symptoms seem resolved. -No acute distress or complaints currently.  New LBBB -No clear chest pain complaints at this time.   -Troponins undetectable. -Cardiology were consulted on admission.  They have evaluated patient and recommend outpatient Lexiscan. -Patient remained stable.    MDM: The below labs and imaging reports were reviewed and summarized above.  Medication management as above.   DVT prophylaxis: Lovenox Code Status: FULL Family Communication:  Disposition: Acute work-up for stroke has been completed at this time will focus on secondary prevention with risk factors modification and the use of 162 mg of aspirin per neurology recommendations.  Physical therapy has recommended inpatient rehab unit and the patient is currently evaluated for candidacy.   Consultants:   Neurology  Procedures:   1/10 CT head--unremarkable  1/11 MRI brain --left Pons acute ischemic stroke.  1/11 MRA head and neck-- Common, internal, and external carotid arteries are patent. Extracranial vertebral arteries are patent. No hemodynamically significant stenosis or evidence of dissection.   1/11 echocardiogram--  1. Left ventricular ejection fraction, by visual estimation, is 55 to 60%. The left ventricle has normal function. There is mildly increased left ventricular hypertrophy.  2. Left ventricular diastolic parameters are consistent with Grade I diastolic dysfunction (impaired relaxation).  3. The left ventricle has no regional wall motion abnormalities.  4. Global right ventricle has normal systolic function.The right ventricular size is normal. No increase in right ventricular wall thickness.  5. Left atrial size was normal.  6. Right atrial size was normal.  7. Presence of pericardial fat pad.  8. Mild mitral annular calcification.  9. The mitral valve is grossly normal. No evidence of mitral valve  regurgitation. 10. The tricuspid valve is grossly normal. 11. The aortic valve is tricuspid. Aortic valve regurgitation is not visualized. 12. The pulmonic valve was grossly normal. Pulmonic valve regurgitation is not visualized. 13. Aortic dilatation noted. 14. 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 15. Mildly elevated pulmonary artery systolic pressure. 16. The tricuspid regurgitant velocity is 2.61 m/s, and with an assumed right atrial pressure of 3 mmHg, the estimated right ventricular systolic pressure is mildly elevated at 30.2 mmHg. 17. The inferior vena cava is normal in size with greater than 50% respiratory variability, suggesting right atrial pressure of 3 mmHg.  Antimicrobials:   None   Subjective: No chest pain, no nausea, no vomiting.  Still demonstrating some difficulty articulating words and complaining of right-sided weakness.  Good oxygen saturation on room air and expressed looking to put all his effort to get better and walk again.    Objective: Vitals:   12/20/19 2312 12/21/19 0301 12/21/19 0700 12/21/19 1345  BP:  119/78 (!) 146/88 104/72  Pulse: 85 76 72 (!) 105  Resp: 16 16 19 20   Temp:  98.1 F (36.7 C) 98.4 F (36.9 C) 98.4 F (36.9 C)  TempSrc:  Oral Oral Oral  SpO2: 97% 95% 96% 98%  Weight:      Height:        Intake/Output Summary (Last 24 hours) at 12/21/2019 1522 Last data filed at 12/21/2019 1345 Gross per 24 hour  Intake --  Output 2700 ml  Net -2700 ml   Filed Weights   12/19/19 1434 12/19/19 2124  Weight: 72.6 kg 69.6 kg    Examination: General exam: Alert, awake, oriented x 3; no chest pain, no nausea, no vomiting.  Patient with right facial droop, mild dysarthria and decreased strength on his right side (no new deficit since admission for stroke).  Good air movement bilaterally, no requiring oxygen supplementation. Respiratory system: Clear to auscultation. Respiratory effort normal. Cardiovascular system:RRR. No murmurs,  rubs, gallops. Gastrointestinal system: Abdomen is nondistended, soft and nontender. No organomegaly or masses felt. Normal bowel sounds heard. Central nervous system: Alert and oriented. No new focal neurological deficits.  Patient still with difficulties articulating his speech and having right-sided weakness and right facial droop.   Extremities: No C/C/E, +pedal pulses Skin: No rashes, lesions or ulcers Psychiatry: Judgement and insight appear normal. Mood & affect appropriate.    Data Reviewed: I have personally reviewed following labs and imaging studies:  CBC: Recent Labs  Lab 12/19/19 1542  WBC 9.3  NEUTROABS 6.0  HGB 15.3  HCT 44.9  MCV 89.1  PLT [id number]   Basic Metabolic Panel: Recent Labs  Lab 12/19/19 1542  NA 136  K 3.5  CL 97*  CO2 24  GLUCOSE 158*  BUN 26*  CREATININE 1.37*  CALCIUM 9.5   GFR: Estimated Creatinine Clearance: 37.2 mL/min (A) (by C-G formula based on SCr of 1.37 mg/dL (H)).   Liver Function Tests: Recent Labs  Lab 12/19/19 1542  AST 92*  ALT 69*  ALKPHOS 59  BILITOT 0.8  PROT 7.2  ALBUMIN 4.1   HbA1C: Recent Labs    12/20/19 0639  HGBA1C 8.2*   CBG: Recent Labs  Lab 12/20/19 1142 12/20/19 1618 12/20/19 2113 12/21/19 0722 12/21/19 1136  GLUCAP 219* 147* 165* 161* 209*   Lipid Profile: Recent Labs    12/20/19 0639  CHOL 258*  HDL 36*  LDLCALC 169*  TRIG 267*  CHOLHDL 7.2    Recent Results (from the past  240 hour(s))  Respiratory Panel by RT PCR (Flu A&B, Covid) - Nasopharyngeal Swab     Status: None   Collection Time: 12/19/19  5:18 PM   Specimen: Nasopharyngeal Swab  Result Value Ref Range Status   SARS Coronavirus 2 by RT PCR NEGATIVE NEGATIVE Final    Comment: (NOTE) SARS-CoV-2 target nucleic acids are NOT DETECTED. The SARS-CoV-2 RNA is generally detectable in upper respiratoy specimens during the acute phase of infection. The lowest concentration of SARS-CoV-2 viral copies this assay can detect is 131  copies/mL. A negative result does not preclude SARS-Cov-2 infection and should not be used as the sole basis for treatment or other patient management decisions. A negative result may occur with  improper specimen collection/handling, submission of specimen other than nasopharyngeal swab, presence of viral mutation(s) within the areas targeted by this assay, and inadequate number of viral copies (<131 copies/mL). A negative result must be combined with clinical observations, patient history, and epidemiological information. The expected result is Negative. Fact Sheet for Patients:  PinkCheek.be Fact Sheet for Healthcare Providers:  GravelBags.it This test is not yet ap proved or cleared by the [place] FDA and  has been authorized for detection and/or diagnosis of SARS-CoV-2 by FDA under an Emergency Use Authorization (EUA). This EUA will remain  in effect (meaning this test can be used) for the duration of the COVID-19 declaration under Section 564(b)(1) of the Act, 21 U.S.C. section 360bbb-3(b)(1), unless the authorization is terminated or revoked sooner.    Influenza A by PCR NEGATIVE NEGATIVE Final   Influenza B by PCR NEGATIVE NEGATIVE Final    Comment: (NOTE) The Xpert Xpress SARS-CoV-2/FLU/RSV assay is intended as an aid in  the diagnosis of influenza from Nasopharyngeal swab specimens and  should not be used as a sole basis for treatment. Nasal washings and  aspirates are unacceptable for Xpert Xpress SARS-CoV-2/FLU/RSV  testing. Fact Sheet for Patients: PinkCheek.be Fact Sheet for Healthcare Providers: GravelBags.it This test is not yet approved or cleared by the [place] FDA and  has been authorized for detection and/or diagnosis of SARS-CoV-2 by  FDA under an Emergency Use Authorization (EUA). This EUA will remain  in effect (meaning this test can  be used) for the duration of the  Covid-19 declaration under Section 564(b)(1) of the Act, 21  U.S.C. section 360bbb-3(b)(1), unless the authorization is  terminated or revoked. Performed at [facility], [address] [place], [place] [address]      Radiology Studies: CT Head Wo Contrast  Result Date: 12/19/2019 CLINICAL DATA:  Right-sided weakness EXAM: CT HEAD WITHOUT CONTRAST TECHNIQUE: Contiguous axial images were obtained from the base of the skull through the vertex without intravenous contrast. COMPARISON:  None. FINDINGS: Brain: There is no acute intracranial hemorrhage, mass-effect, or edema. Gray-white differentiation is preserved. There is no extra-axial fluid collection. Ventricles and sulci are within normal limits in size and configuration. Patchy hypoattenuation in the supratentorial white matter is nonspecific but may reflect mild chronic microvascular ischemic changes. Vascular: There is atherosclerotic calcification at the skull base. Skull: Calvarium is unremarkable. Sinuses/Orbits: Focal opacification of a left ethmoid air cell. Orbits are unremarkable. Other: Mastoid air cells are clear. IMPRESSION: No acute intracranial hemorrhage, mass effect, or evidence of acute infarction. Mild chronic microvascular ischemic changes. Electronically Signed   By: Macy Mis M.D.   On: 12/19/2019 16:09   MR ANGIO HEAD WO CONTRAST  Result Date: 12/20/2019 CLINICAL DATA:  Right-sided weakness, facial asymmetry EXAM: MRI HEAD WITHOUT  CONTRAST MRA HEAD WITHOUT CONTRAST MRA NECK WITHOUT AND WITH CONTRAST TECHNIQUE: Multiplanar, multiecho pulse sequences of the brain and surrounding structures were obtained without intravenous contrast. Angiographic images of the Circle of Willis were obtained using MRA technique without intravenous contrast. Angiographic images of the neck were obtained using MRA technique without and with intravenous contrast. Carotid stenosis measurements (when applicable)  are obtained utilizing NASCET criteria, using the distal internal carotid diameter as the denominator. CONTRAST:  9mL GADAVIST GADOBUTROL 1 MMOL/ML IV SOLN COMPARISON:  None. FINDINGS: MRI HEAD FINDINGS Brain: There is reduced diffusion in the parasagittal left pons. No evidence of hemorrhage. Patchy T2 hyperintensity in the supratentorial white matter is nonspecific but may reflect mild chronic microvascular ischemic changes. Ventricles and sulci are within normal limits in size and configuration. There is no intracranial mass, mass effect, hydrocephalus, or extra-axial fluid collection. Vascular: Major vessel flow voids at the skull base are preserved. Skull and upper cervical spine: Marrow signal is unremarkable. Sinuses/Orbits: No acute abnormality. Other: None. MRA HEAD FINDINGS Intracranial internal carotid arteries are patent. Middle and anterior cerebral arteries are patent. Intracranial vertebral arteries, basilar artery, posterior cerebral arteries are patent. A left posterior communicating artery is present. There is no significant stenosis or aneurysm. MRA NECK FINDINGS Common, internal, and external carotid arteries are patent. Extracranial vertebral arteries are patent. No hemodynamically significant stenosis or evidence of dissection. 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 No hemodynamically significant stenosis or evidence of dissection. Electronically Signed   By: Macy Mis M.D.   On: 12/20/2019 12:19   MR ANGIO NECK W WO CONTRAST  Result Date: 12/20/2019 CLINICAL DATA:  Right-sided weakness, facial asymmetry EXAM: MRI HEAD WITHOUT CONTRAST MRA HEAD WITHOUT CONTRAST MRA NECK WITHOUT AND WITH CONTRAST TECHNIQUE: Multiplanar, multiecho pulse sequences of the brain and surrounding structures were obtained without intravenous contrast. Angiographic images of the Circle of Willis were obtained using MRA technique without  intravenous contrast. Angiographic images of the neck were obtained using MRA technique without and with intravenous contrast. Carotid stenosis measurements (when applicable) are obtained utilizing NASCET criteria, using the distal internal carotid diameter as the denominator. CONTRAST:  16mL GADAVIST GADOBUTROL 1 MMOL/ML IV SOLN COMPARISON:  None. FINDINGS: MRI HEAD FINDINGS Brain: There is reduced diffusion in the parasagittal left pons. No evidence of hemorrhage. Patchy T2 hyperintensity in the supratentorial white matter is nonspecific but may reflect mild chronic microvascular ischemic changes. Ventricles and sulci are within normal limits in size and configuration. There is no intracranial mass, mass effect, hydrocephalus, or extra-axial fluid collection. Vascular: Major vessel flow voids at the skull base are preserved. Skull and upper cervical spine: Marrow signal is unremarkable. Sinuses/Orbits: No acute abnormality. Other: None. MRA HEAD FINDINGS Intracranial internal carotid arteries are patent. Middle and anterior cerebral arteries are patent. Intracranial vertebral arteries, basilar artery, posterior cerebral arteries are patent. A left posterior communicating artery is present. There is no significant stenosis or aneurysm. MRA NECK FINDINGS Common, internal, and external carotid arteries are patent. Extracranial vertebral arteries are patent. No hemodynamically significant stenosis or evidence of dissection. 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 No hemodynamically significant stenosis or evidence of dissection. Electronically Signed   By: Macy Mis M.D.   On: 12/20/2019 12:19   MR BRAIN WO CONTRAST  Result Date: 12/20/2019 CLINICAL DATA:  Right-sided weakness, facial asymmetry EXAM: MRI HEAD WITHOUT CONTRAST MRA HEAD WITHOUT CONTRAST MRA NECK WITHOUT AND WITH  CONTRAST TECHNIQUE: Multiplanar, multiecho pulse sequences of the  brain and surrounding structures were obtained without intravenous contrast. Angiographic images of the Circle of Willis were obtained using MRA technique without intravenous contrast. Angiographic images of the neck were obtained using MRA technique without and with intravenous contrast. Carotid stenosis measurements (when applicable) are obtained utilizing NASCET criteria, using the distal internal carotid diameter as the denominator. CONTRAST:  71mL GADAVIST GADOBUTROL 1 MMOL/ML IV SOLN COMPARISON:  None. FINDINGS: MRI HEAD FINDINGS Brain: There is reduced diffusion in the parasagittal left pons. No evidence of hemorrhage. Patchy T2 hyperintensity in the supratentorial white matter is nonspecific but may reflect mild chronic microvascular ischemic changes. Ventricles and sulci are within normal limits in size and configuration. There is no intracranial mass, mass effect, hydrocephalus, or extra-axial fluid collection. Vascular: Major vessel flow voids at the skull base are preserved. Skull and upper cervical spine: Marrow signal is unremarkable. Sinuses/Orbits: No acute abnormality. Other: None. MRA HEAD FINDINGS Intracranial internal carotid arteries are patent. Middle and anterior cerebral arteries are patent. Intracranial vertebral arteries, basilar artery, posterior cerebral arteries are patent. A left posterior communicating artery is present. There is no significant stenosis or aneurysm. MRA NECK FINDINGS Common, internal, and external carotid arteries are patent. Extracranial vertebral arteries are patent. No hemodynamically significant stenosis or evidence of dissection. 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 No hemodynamically significant stenosis or evidence of dissection. Electronically Signed   By: Macy Mis M.D.   On: 12/20/2019 12:19   CT ABDOMEN PELVIS W CONTRAST  Result Date: 12/20/2019 CLINICAL DATA:  Abdominal pain with  vomiting and diarrhea EXAM: CT ABDOMEN AND PELVIS WITH CONTRAST TECHNIQUE: Multidetector CT imaging of the abdomen and pelvis was performed using the standard protocol following bolus administration of intravenous contrast. CONTRAST:  144mL OMNIPAQUE IOHEXOL 300 MG/ML  SOLN COMPARISON:  None. FINDINGS: LOWER CHEST: Cardiomegaly and bibasilar atelectasis. HEPATOBILIARY: Diffuse hypoattenuation of the liver relative to the spleen suggests hepatic steatosis. No focal liver lesion or biliary dilatation. Normal gallbladder. PANCREAS: Normal pancreas. No ductal dilatation or peripancreatic fluid collection. SPLEEN: Normal. ADRENALS/URINARY TRACT: The adrenal glands are normal. No hydronephrosis, nephroureterolithiasis or solid renal mass. The urinary bladder is normal for degree of distention STOMACH/BOWEL: There is no hiatal hernia. Normal duodenal course and caliber. No small bowel dilatation or inflammation. No focal colonic abnormality. Normal appendix. VASCULAR/LYMPHATIC: There is calcific atherosclerosis of the abdominal aorta. No abdominal or pelvic lymphadenopathy. REPRODUCTIVE: Normal prostate size with symmetric seminal vesicles. MUSCULOSKELETAL. No bony spinal canal stenosis or focal osseous abnormality. OTHER: None. IMPRESSION: 1. No acute abnormality of the abdomen or pelvis. 2. Hepatic steatosis. 3. Aortic Atherosclerosis (ICD10-I70.0). Electronically Signed   By: Ulyses Jarred M.D.   On: 12/20/2019 01:13   ECHOCARDIOGRAM COMPLETE  Result Date: 12/20/2019   ECHOCARDIOGRAM REPORT   Patient Name:   Eugene Cooper Date of Exam: 12/20/2019 Medical Rec #:  [id number]          Height:       64.0 in Accession #:    [id number]         Weight:       153.4 lb Date of Birth:  Oct 14, 1941          BSA:          1.75 m Patient Age:    48 years           BP:  131/85 mmHg Patient Gender: M                  HR:           65 bpm. Exam Location:  [facility] Procedure: 2D Echo, Cardiac Doppler and Color Doppler  Indications:    Stroke 434.91 / I163.9  History:        Patient has no prior history of Echocardiogram examinations.                 Risk Factors:Hypertension and Dyslipidemia.  Sonographer:    Alvino Chapel RCS Referring Phys: [phone] [place]  1. Left ventricular ejection fraction, by visual estimation, is 55 to 60%. The left ventricle has normal function. There is mildly increased left ventricular hypertrophy.  2. Left ventricular diastolic parameters are consistent with Grade I diastolic dysfunction (impaired relaxation).  3. The left ventricle has no regional wall motion abnormalities.  4. Global right ventricle has normal systolic function.The right ventricular size is normal. No increase in right ventricular wall thickness.  5. Left atrial size was normal.  6. Right atrial size was normal.  7. Presence of pericardial fat pad.  8. Mild mitral annular calcification.  9. The mitral valve is grossly normal. No evidence of mitral valve regurgitation. 10. The tricuspid valve is grossly normal. 11. The aortic valve is tricuspid. Aortic valve regurgitation is not visualized. 12. The pulmonic valve was grossly normal. Pulmonic valve regurgitation is not visualized. 13. Aortic dilatation noted. 14. 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 15. Mildly elevated pulmonary artery systolic pressure. 16. The tricuspid regurgitant velocity is 2.61 m/s, and with an assumed right atrial pressure of 3 mmHg, the estimated right ventricular systolic pressure is mildly elevated at 30.2 mmHg. 17. The inferior vena cava is normal in size with greater than 50% respiratory variability, suggesting right atrial pressure of 3 mmHg. FINDINGS  Left Ventricle: Left ventricular ejection fraction, by visual estimation, is 55 to 60%. The left ventricle has normal function. The left ventricle has no regional wall motion abnormalities. The left ventricular internal cavity size was the left ventricle is normal in size.  There is mildly increased left ventricular hypertrophy. Left ventricular diastolic parameters are consistent with Grade I diastolic dysfunction (impaired relaxation). Right Ventricle: The right ventricular size is normal. No increase in right ventricular wall thickness. Global RV systolic function is has normal systolic function. The tricuspid regurgitant velocity is 2.61 m/s, and with an assumed right atrial pressure  of 3 mmHg, the estimated right ventricular systolic pressure is mildly elevated at 30.2 mmHg. Left Atrium: Left atrial size was normal in size. Right Atrium: Right atrial size was normal in size Pericardium: There is no evidence of pericardial effusion. Presence of pericardial fat pad. Mitral Valve: The mitral valve is grossly normal. Mild mitral annular calcification. No evidence of mitral valve regurgitation. Tricuspid Valve: The tricuspid valve is grossly normal. Tricuspid valve regurgitation is mild. Aortic Valve: The aortic valve is tricuspid. Aortic valve regurgitation is not visualized. Mild aortic valve annular calcification. Pulmonic Valve: The pulmonic valve was grossly normal. Pulmonic valve regurgitation is not visualized. Pulmonic regurgitation is not visualized. Aorta: Aortic dilatation noted. 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 Venous: The inferior vena cava is normal in size with greater than 50% respiratory variability, suggesting right atrial pressure of 3 mmHg. IAS/Shunts: No atrial level shunt detected by color flow Doppler.  LEFT VENTRICLE PLAX 2D LVIDd:  4.09 cm       Diastology LVIDs:         2.70 cm       LV e' lateral:   5.22 cm/s LV PW:         1.13 cm       LV E/e' lateral: 10.5 LV IVS:        1.23 cm       LV e' medial:    4.68 cm/s LVOT diam:     1.90 cm       LV E/e' medial:  11.7 LV SV:         47 ml LV SV Index:   26.16 LVOT Area:     2.84 cm  LV Volumes (MOD) LV area d, A2C:    19.10 cm LV area d, A4C:    20.30 cm LV area s, A2C:    10.60 cm LV area  s, A4C:    10.90 cm LV major d, A2C:   7.02 cm LV major d, A4C:   6.64 cm LV major s, A2C:   5.14 cm LV major s, A4C:   4.99 cm LV vol d, MOD A2C: 43.6 ml LV vol d, MOD A4C: 52.7 ml LV vol s, MOD A2C: 19.0 ml LV vol s, MOD A4C: 21.1 ml LV SV MOD A2C:     24.6 ml LV SV MOD A4C:     52.7 ml LV SV MOD BP:      28.8 ml RIGHT VENTRICLE RV S prime:     13.30 cm/s TAPSE (M-mode): 1.5 cm LEFT ATRIUM             Index LA diam:        3.10 cm 1.77 cm/m LA Vol (A2C):   30.6 ml 17.51 ml/m LA Vol (A4C):   35.6 ml 20.37 ml/m LA Biplane Vol: 36.4 ml 20.82 ml/m  AORTIC VALVE LVOT Vmax:   102.00 cm/s LVOT Vmean:  65.000 cm/s LVOT VTI:    0.187 m  AORTA Ao Root diam: 4.20 cm MITRAL VALVE                        TRICUSPID VALVE MV Area (PHT): 2.45 cm             TR Peak grad:   27.2 mmHg MV PHT:        89.90 msec           TR Vmax:        261.00 cm/s MV Decel Time: 310 msec MV E velocity: 54.80 cm/s 103 cm/s  SHUNTS MV A velocity: 81.40 cm/s 70.3 cm/s Systemic VTI:  0.19 m MV E/A ratio:  0.67       1.5       Systemic Diam: 1.90 cm  Rozann Lesches MD Electronically signed by Rozann Lesches MD Signature Date/Time: 12/20/2019/3:36:00 PM    Final     Scheduled Meds: . aspirin EC  162 mg Oral Daily  . atorvastatin  40 mg Oral q1800  . feeding supplement (GLUCERNA SHAKE)  237 mL Oral BID BM  . insulin aspart  0-5 Units Subcutaneous QHS  . insulin aspart  0-9 Units Subcutaneous TID WC  . multivitamin with minerals  1 tablet Oral Daily   Continuous Infusions:   LOS: 1 day    Time spent: 30 minutes    Barton Dubois, MD [phone]  [facility] 12/21/2019, 3:22 PM

## 2019-12-21 NOTE — Evaluation (Signed)
Occupational Therapy Evaluation Patient Details Name: Eugene Cooper MRN: [id number] DOB: [birth year] Today's Date: 12/21/2019    History of Present Illness Eugene Cooper is a 79 y.o. male with medical history significant for hypertension, dyslipidemia.  Patient is Spanish-speaking, history is obtained with the help of an tele- interpreter.  Patient presented to the ED with complaints of weakness with his right upper extremity and right lower extremity weakness with resultant inability to walk.  He also reports change in his voice, but is unable to confirm facial asymmetry.  He tells me symptoms started 2 to 3 days ago.he also reports he was having multiple episodes of vomiting that day that lasted just 1 day, and loose stools of 2 days duration.  Reported diffuse abdominal aches, generalized body aches, pain in his ankles also.  No specific complaints of chest pain, he is unable to tell me specifically if he has had any difficulty breathing.  After vomiting stopped, his abdominal pain has persisted to today, also today right-sided weakness hence he presented to the ED today.Denies current alcohol intake.  Denies Covid positive contacts.   Clinical Impression   Pt agreeable to OT evaluation, interpreter utilized during session. Pt requiring consistent verbal cuing to slow down and focus on incorporating RUE into tasks. Pt reporting "numbness" throughout right side, however hair fair proprioception and does attempt to utilize RUE when reminded. Pt is requiring significant assistance with ADLS and will benefit from continued OT services to improve safety and independence in functional tasks. Pt was independent PTA and is motivated to improve his functioning. Recommend CIR on discharge, pt verbalized understanding of recommendation.     Follow Up Recommendations  CIR    Equipment Recommendations  None recommended by OT       Precautions / Restrictions Precautions Precautions:  Fall Restrictions Weight Bearing Restrictions: No      Mobility Bed Mobility Overal bed mobility: Needs Assistance Bed Mobility: Supine to Sit;Sit to Supine     Supine to sit: Min assist Sit to supine: Supervision   General bed mobility comments: increased time required, verbal cuing for hand placement   Transfers Overall transfer level: Needs assistance Equipment used: None Transfers: Lateral/Scoot Transfers          Lateral/Scoot Transfers: Mod assist General transfer comment: Pt scooting towards HOB prior to returning to supine, verbal cuing for hand and foot placement, mod assist to successfully scoot hips.         ADL either performed or assessed with clinical judgement   ADL Overall ADL's : Needs assistance/impaired Eating/Feeding: Set up;Bed level Eating/Feeding Details (indicate cue type and reason): Assist for opening packages and cutting food Grooming: Oral care Grooming Details (indicate cue type and reason): Pt seated at EOB for teeth brushing, attempted to use RUE as assist to open toothpaste however was unable to complete. OT assisted with opening, pt then held toothbrush with right hand in lateral pinch to put paste on the brush. Used left hand to brush teeth.          Upper Body Dressing : Moderate assistance;Sitting Upper Body Dressing Details (indicate cue type and reason): OT assisting with managing gown ties at back of neck Lower Body Dressing: Maximal assistance;Sitting/lateral leans Lower Body Dressing Details (indicate cue type and reason): pt unable to successfully use RUE to assist with dressing due to weakness and coordination deficits. Also with balance deficits when seated and attempting to bend forward to reach legs/feet  General ADL Comments: Pt requiring increased assistance with ADLs due to right hemiplegia     Vision Baseline Vision/History: No visual deficits Patient Visual Report: No change from baseline Vision  Assessment?: No apparent visual deficits            Pertinent Vitals/Pain Pain Assessment: No/denies pain     Hand Dominance Right   Extremity/Trunk Assessment Upper Extremity Assessment Upper Extremity Assessment: RUE deficits/detail RUE Deficits / Details: shoulder strength 3-/5, elbow and wrist 3/5, decreased grip and pinch strength. Able to oppose to index finger only, poor thumb extension and abduction RUE Sensation: decreased light touch;decreased proprioception RUE Coordination: decreased fine motor;decreased gross motor   Lower Extremity Assessment Lower Extremity Assessment: Defer to PT evaluation   Cervical / Trunk Assessment Cervical / Trunk Assessment: Kyphotic   Communication Communication Communication: Prefers language other than English;Interpreter utilized(interpreter [phone])   Cognition Arousal/Alertness: Awake/alert Behavior During Therapy: WFL for tasks assessed/performed Overall Cognitive Status: Within Functional Limits for tasks assessed                                                Home Living   Living Arrangements: Children Available Help at Discharge: Family;Available PRN/intermittently Type of Home: House Home Access: Stairs to enter [employer] of Steps: 6 Entrance Stairs-Rails: Right Home Layout: One level               Home Equipment: Cane - single point          Prior Functioning/Environment Level of Independence: Independent        Comments: independent with ADLs and mobility, does not drive        OT Problem List: Decreased strength;Decreased activity tolerance;Impaired balance (sitting and/or standing);Decreased coordination;Decreased safety awareness;Decreased knowledge of use of DME or AE;Impaired sensation;Impaired UE functional use      OT Treatment/Interventions: Self-care/ADL training;Therapeutic exercise;Neuromuscular education;DME and/or AE instruction;Therapeutic  activities;Patient/family education    OT Goals(Current goals can be found in the care plan section) Acute Rehab OT Goals Patient Stated Goal: return home after rehab OT Goal Formulation: With patient Time For Goal Achievement: 01/04/20 Potential to Achieve Goals: Good  OT Frequency: Min 2X/week                 End of Session    Activity Tolerance: Patient tolerated treatment well Patient left: in bed;with call bell/phone within reach;with bed alarm set  OT Visit Diagnosis: Hemiplegia and hemiparesis Hemiplegia - Right/Left: Right Hemiplegia - dominant/non-dominant: Dominant Hemiplegia - caused by: Cerebral infarction                Time: [id number] OT Time Calculation (min): 32 min Charges:  OT General Charges $OT Visit: 1 Visit OT Evaluation $OT Eval Moderate Complexity: [place], OTR/L  [phone] 12/21/2019, 8:19 AM

## 2019-12-21 NOTE — Plan of Care (Signed)
  Problem: Acute Rehab OT Goals (only OT should resolve) Goal: Pt. Will Perform Eating Flowsheets (Taken 12/21/2019 0824) Pt Will Perform Eating:  with set-up  sitting Note: Using RUE as assist to prepare food Goal: Pt. Will Perform Grooming Flowsheets (Taken 12/21/2019 0824) Pt Will Perform Grooming:  with set-up  sitting Goal: Pt. Will Perform Upper Body Dressing Flowsheets (Taken 12/21/2019 0824) Pt Will Perform Upper Body Dressing:  with min assist  sitting Goal: Pt. Will Transfer To Toilet Flowsheets (Taken 12/21/2019 [phone]) Pt Will Transfer to Toilet:  with min assist  stand pivot transfer  ambulating  regular height toilet  bedside commode Goal: Pt. Will Perform Toileting-Clothing Manipulation Flowsheets (Taken 12/21/2019 0824) Pt Will Perform Toileting - Clothing Manipulation and hygiene:  with min guard assist  sitting/lateral leans  sit to/from stand Goal: Pt/Caregiver Will Perform Home Exercise Program Flowsheets (Taken 12/21/2019 [phone]) Pt/caregiver will Perform Home Exercise Program:  Increased strength  Right Upper extremity  Independently  With written HEP provided

## 2019-12-21 NOTE — Evaluation (Signed)
Speech Language Pathology Evaluation Patient Details Name: Eugene Cooper MRN: [id number] DOB: 1941-09-28 Today's Date: 12/21/2019 Time: 2426-8341 SLP Time Calculation (min) (ACUTE ONLY): 23 min  Problem List:  Patient Active Problem List   Diagnosis Date Noted  . Stroke ([place]) 12/20/2019  . Right sided weakness 12/19/2019   Past Medical History:  Past Medical History:  Diagnosis Date  . Hyperlipemia   . Hypertension    Past Surgical History: History reviewed. No pertinent surgical history. HPI:  Eugene Cooper is a 79 y.o. male with medical history significant for hypertension, dyslipidemia.  Patient is Spanish-speaking, history is obtained with the help of an tele- interpreter.  Patient presented to the ED with complaints of weakness with his right upper extremity and right lower extremity weakness with resultant inability to walk.  He also reports change in his voice, but is unable to confirm facial asymmetry.  He tells me symptoms started 2 to 3 days ago.he also reports he was having multiple episodes of vomiting that day that lasted just 1 day, and loose stools of 2 days duration.  Reported diffuse abdominal aches, generalized body aches, pain in his ankles also.  No specific complaints of chest pain, he is unable to tell me specifically if he has had any difficulty breathing.  After vomiting stopped, his abdominal pain has persisted to today, also today right-sided weakness. 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 SLE requested and completed this date   Assessment / Plan / Recommendation Clinical Impression  Speech Language evaluation completed while Pt was seated upright in chair; interpreter was utilized during evaluation. Pt reports he has completed no schooling; Pt's son was present for evaluation and reports his father's communication and ability to remember and his overall cognition is completely normal an not changed from baseline. SLP utilized  portions of the [employer] Assessment (MoCA) and other measures to assess Pt's cognition and expressive and receptive language. Pt does appear to have impaired cognition however it is challenging to discern if this really is baseline or a change; expressive and receptive language appear to be largely intact and not impacted. Pt correctly named all pictures presented and was completely oriented with the exception of being able to provide today's date. Pt repeated back 4/5 words for immediate recall and with a 5 minute delay was only able to recall 2 words WITH category cues provided. When asked to produce words that begin with "B" he produced 2 words: his first and last name which both start with "B" but was unable to produce any more. Pt would benefit from a more in depth SLE at d/c location preferably by a bilingual SLP. All speech needs can be met at discharge location. Thank you for this referral,    SLP Assessment  SLP Recommendation/Assessment: All further Speech Lanaguage Pathology  needs can be addressed in the next venue of care SLP Visit Diagnosis: Cognitive communication deficit (R41.841)             SLP Evaluation Cognition  Overall Cognitive Status: Within Functional Limits for tasks assessed Orientation Level: Oriented to person;Oriented to place;Oriented to situation;Oriented to time Attention: Focused Focused Attention: Appears intact Memory: Impaired Awareness: Appears intact Problem Solving: Impaired       Comprehension  Auditory Comprehension Overall Auditory Comprehension: Appears within functional limits for tasks assessed    Expression Verbal Expression Overall Verbal Expression: Appears within functional limits for tasks assessed Naming: No impairment Written Expression Dominant Hand: Right   Oral / Motor  Oral Motor/Sensory Function Overall Oral Motor/Sensory Function: Mild impairment Facial ROM: Reduced right Facial Symmetry: Abnormal symmetry  right Facial Strength: Reduced right Motor Speech Overall Motor Speech: Appears within functional limits for tasks assessed     Eugene Cooper H. Roddie Mc, CCC-SLP Speech Language Pathologist         Eugene Cooper 12/21/2019, 12:23 PM

## 2019-12-21 NOTE — Progress Notes (Signed)
Initial Nutrition Assessment  DOCUMENTATION CODES:   Not applicable  INTERVENTION:  Glucerna Shake po BID, each supplement provides 220 kcal and 10 grams of protein  MVI with minerals daily  Inappropriate for education at this time   NUTRITION DIAGNOSIS:   Increased nutrient needs related to acute illness(acute CVA) as evidenced by estimated needs.   GOAL:   Patient will meet greater than or equal to 90% of their needs  MONITOR:   PO intake, Supplement acceptance, Labs, Weight trends, I & O's  REASON FOR ASSESSMENT:   Malnutrition Screening Tool    ASSESSMENT:  79 year old male with past medical history of HTN, HLD who presented to the ED with complaints of weakness to right upper extremity, right lower extremity resulting in inability to ambulate, reports change in his voice, diffuse abdominal pain and generalized body aches over the past 2-3 days. Head CT negative for acute intracranial abnormalities, telemetry neurology consulted d/t concerns for acute ischemic stroke and patient admitted for full stroke work-up.  MRI revealed acute infarction of the parasagittal left pons; mild chronic microvascular ischemic changes; no hemodynamically significant stenosis or evidence of dissection.  1/11 Echo  New LBBB identified on EKG, per notes pt has cardiology outpatient follow up and likely lexiscan once recovered from stroke No further cardiology inpatient workup planned.   OT evaluation today, noted pt requiring significant assistance with ADL; independent PTA and motivated to improve function. CIR recommended on discharge.  Patient eating 75% of 1 recorded meal this admission on HH/CM diet.   Spoke with patient at bedside; video interpretor service used for interview. Patient was a poor historian, limited nutrition history obtained. Patient reports that his appetite is good and he has been eating well during admission. Patient endorses eating very little at home secondary to  feeling dizzy and weak. Patient repeatedly reporting pain/weakness to upper/lower extremities prior to hospital presentation and appeared to focus on that throughout interview. He reports that he tries to drink lots of water and recalls 1 small meal/day of bananas with milk.  Patient noted with A1c 8.2 this admission, and would benefit from education on heart healthy, consistent carbohydrate education. RD did not provide education secondary to patient is inappropriate at this time due to impaired cognition/communication deficits. Per notes, pt is felt to be appropriate candidate for CIR pending bed availability. Patient may be more appropriate for education prior to discharge from inpatient rehab program.   RD encouraged patient to continue good oral intake of meals, patient amenable to receiving ONS to aid with calorie/protein needs. He prefers chocolate or vanilla flavors.   No weight history available for review I/Os: -2210 ml since admit     -1960 ml x 24 hrs UOP: 2200 ml x 24 hrs  Medications reviewed and include: SS novolg  Labs: CBGs 161,165,147 x 24 hrs, TGs 267 Lab Results  Component Value Date   HGBA1C 8.2 (H) 12/20/2019    NUTRITION - FOCUSED PHYSICAL EXAM: 1/12 Findings: Mild fat depletion to upper arm and buccal regions; Moderate fat depletion to orbital region; Mild muscle depletion to temple, clavicle, and dorsal hand regions.    Diet Order:   Diet Order            Diet heart healthy/carb modified Room service appropriate? Yes; Fluid consistency: Thin  Diet effective now              EDUCATION NEEDS:   Not appropriate for education at this time  Skin:  Skin  Assessment: Reviewed RN Assessment  Last BM:  1/11 (type 7)  Height:   Ht Readings from Last 1 Encounters:  12/19/19 5\' 4"  (1.626 m)    Weight:   Wt Readings from Last 1 Encounters:  12/19/19 69.6 kg    Ideal Body Weight:  59.1 kg  BMI:  Body mass index is 26.34 kg/m.  Estimated Nutritional  Needs:   Kcal:  1600-1800  Protein:  80-90  Fluid:  > 1.5 L/day   Lajuan Lines, RD, LDN Clinical Nutrition Office Telephone [phone] After Hours/Weekend Pager: [phone]

## 2019-12-22 ENCOUNTER — Other Ambulatory Visit: Payer: Self-pay

## 2019-12-22 ENCOUNTER — Encounter (HOSPITAL_COMMUNITY): Payer: Self-pay | Admitting: Physical Medicine & Rehabilitation

## 2019-12-22 ENCOUNTER — Inpatient Hospital Stay (HOSPITAL_COMMUNITY)
Admission: RE | Admit: 2019-12-22 | Discharge: 2020-01-07 | DRG: 057 | Disposition: A | Discharge status: Home Health | Payer: Medicare Other | Source: Other Acute Inpatient Hospital | Attending: Physical Medicine & Rehabilitation | Admitting: Physical Medicine & Rehabilitation

## 2019-12-22 DIAGNOSIS — I10 Essential (primary) hypertension: Secondary | ICD-10-CM

## 2019-12-22 DIAGNOSIS — I639 Cerebral infarction, unspecified: Secondary | ICD-10-CM | POA: Diagnosis not present

## 2019-12-22 DIAGNOSIS — E1165 Type 2 diabetes mellitus with hyperglycemia: Secondary | ICD-10-CM

## 2019-12-22 DIAGNOSIS — I6350 Cerebral infarction due to unspecified occlusion or stenosis of unspecified cerebral artery: Secondary | ICD-10-CM | POA: Diagnosis present

## 2019-12-22 DIAGNOSIS — I447 Left bundle-branch block, unspecified: Secondary | ICD-10-CM | POA: Diagnosis present

## 2019-12-22 DIAGNOSIS — E782 Mixed hyperlipidemia: Secondary | ICD-10-CM

## 2019-12-22 DIAGNOSIS — Z7984 Long term (current) use of oral hypoglycemic drugs: Secondary | ICD-10-CM

## 2019-12-22 DIAGNOSIS — R799 Abnormal finding of blood chemistry, unspecified: Secondary | ICD-10-CM

## 2019-12-22 DIAGNOSIS — M25561 Pain in right knee: Secondary | ICD-10-CM

## 2019-12-22 DIAGNOSIS — Z23 Encounter for immunization: Secondary | ICD-10-CM | POA: Diagnosis not present

## 2019-12-22 DIAGNOSIS — I69351 Hemiplegia and hemiparesis following cerebral infarction affecting right dominant side: Principal | ICD-10-CM

## 2019-12-22 DIAGNOSIS — K760 Fatty (change of) liver, not elsewhere classified: Secondary | ICD-10-CM | POA: Diagnosis not present

## 2019-12-22 DIAGNOSIS — R7309 Other abnormal glucose: Secondary | ICD-10-CM

## 2019-12-22 DIAGNOSIS — M545 Low back pain: Secondary | ICD-10-CM | POA: Diagnosis present

## 2019-12-22 DIAGNOSIS — K5900 Constipation, unspecified: Secondary | ICD-10-CM | POA: Diagnosis present

## 2019-12-22 DIAGNOSIS — M1711 Unilateral primary osteoarthritis, right knee: Secondary | ICD-10-CM | POA: Diagnosis present

## 2019-12-22 DIAGNOSIS — Z7982 Long term (current) use of aspirin: Secondary | ICD-10-CM | POA: Diagnosis not present

## 2019-12-22 DIAGNOSIS — M25461 Effusion, right knee: Secondary | ICD-10-CM | POA: Diagnosis present

## 2019-12-22 DIAGNOSIS — M792 Neuralgia and neuritis, unspecified: Secondary | ICD-10-CM

## 2019-12-22 DIAGNOSIS — R531 Weakness: Secondary | ICD-10-CM | POA: Diagnosis present

## 2019-12-22 LAB — CREATININE, SERUM
Creatinine, Ser: 1.11 mg/dL (ref 0.61–1.24)
GFR calc Af Amer: >60 mL/min (ref >60)
GFR calc non Af Amer: >60 mL/min (ref >60)

## 2019-12-22 LAB — GLUCOSE, CAPILLARY
Glucose-Capillary: 168 mg/dL — ABNORMAL HIGH (ref 70–99)
Glucose-Capillary: 256 mg/dL — ABNORMAL HIGH (ref 70–99)
Glucose-Capillary: 208 mg/dL — ABNORMAL HIGH (ref 70–99)
Glucose-Capillary: 200 mg/dL — ABNORMAL HIGH (ref 70–99)

## 2019-12-22 MED ORDER — POLYETHYLENE GLYCOL 3350 17 G PO PACK
17.0000 g | PACK | Freq: Every day | ORAL | Status: DC | PRN
  Administered 2019-12-26 – 2020-01-04 (×5): 17 g via ORAL
  Filled 2019-12-22 (×5): qty 1

## 2019-12-22 MED ORDER — ATORVASTATIN CALCIUM 40 MG PO TABS: 40.0000 mg | ORAL_TABLET | Freq: Every day | ORAL | 1 refills | Status: DC

## 2019-12-22 MED ORDER — FLEET ENEMA 7-19 GM/118ML RE ENEM: 1.0000 | ENEMA | Freq: Once | RECTAL | Status: DC | PRN

## 2019-12-22 MED ORDER — ASPIRIN 81 MG PO TBEC: 162.0000 mg | DELAYED_RELEASE_TABLET | Freq: Every day | ORAL | 0 refills | Status: DC

## 2019-12-22 MED ORDER — ENSURE MAX PROTEIN PO LIQD
11.0000 [oz_av] | Freq: Two times a day (BID) | ORAL | Status: DC
  Administered 2019-12-22 – 2020-01-07 (×32): 11 [oz_av] via ORAL
  Filled 2019-12-22 (×33): qty 330

## 2019-12-22 MED ORDER — ALUM & MAG HYDROXIDE-SIMETH 200-200-20 MG/5ML PO SUSP: 30.0000 mL | ORAL | Status: DC | PRN

## 2019-12-22 MED ORDER — METFORMIN HCL 500 MG PO TABS: 1000.0000 mg | ORAL_TABLET | Freq: Two times a day (BID) | ORAL | Status: DC

## 2019-12-22 MED ORDER — DIPHENHYDRAMINE HCL 12.5 MG/5ML PO ELIX: 12.5000 mg | ORAL_SOLUTION | Freq: Four times a day (QID) | ORAL | Status: DC | PRN

## 2019-12-22 MED ORDER — GLIPIZIDE 5 MG PO TABS: 5.0000 mg | ORAL_TABLET | Freq: Every day | ORAL | 0 refills | Status: DC

## 2019-12-22 MED ORDER — ADULT MULTIVITAMIN W/MINERALS CH
1.0000 | ORAL_TABLET | Freq: Every day | ORAL | Status: DC
  Administered 2019-12-23 – 2020-01-07 (×16): 1 via ORAL
  Filled 2019-12-22 (×16): qty 1

## 2019-12-22 MED ORDER — PROCHLORPERAZINE 25 MG RE SUPP: 12.5000 mg | Freq: Four times a day (QID) | RECTAL | Status: DC | PRN

## 2019-12-22 MED ORDER — LISINOPRIL 5 MG PO TABS: 5.0000 mg | ORAL_TABLET | Freq: Every day | ORAL | 0 refills | Status: DC

## 2019-12-22 MED ORDER — BISACODYL 10 MG RE SUPP
10.0000 mg | Freq: Every day | RECTAL | Status: DC | PRN
  Filled 2019-12-22: qty 1

## 2019-12-22 MED ORDER — TRAZODONE HCL 50 MG PO TABS
25.0000 mg | ORAL_TABLET | Freq: Every evening | ORAL | Status: DC | PRN
  Administered 2019-12-29: 50 mg via ORAL
  Filled 2019-12-22 (×3): qty 1

## 2019-12-22 MED ORDER — ENOXAPARIN SODIUM 40 MG/0.4ML ~~LOC~~ SOLN
40.0000 mg | SUBCUTANEOUS | Status: DC
  Administered 2019-12-22 – 2020-01-04 (×14): 40 mg via SUBCUTANEOUS
  Filled 2019-12-22 (×14): qty 0.4

## 2019-12-22 MED ORDER — POLYETHYLENE GLYCOL 3350 17 G PO PACK: 17.0000 g | PACK | Freq: Every day | ORAL | Status: DC | PRN

## 2019-12-22 MED ORDER — GUAIFENESIN-DM 100-10 MG/5ML PO SYRP: 5.0000 mL | ORAL_SOLUTION | Freq: Four times a day (QID) | ORAL | Status: DC | PRN

## 2019-12-22 MED ORDER — ATORVASTATIN CALCIUM 40 MG PO TABS
40.0000 mg | ORAL_TABLET | Freq: Every day | ORAL | Status: DC
  Administered 2019-12-22 – 2020-01-06 (×17): 40 mg via ORAL
  Filled 2019-12-22 (×17): qty 1

## 2019-12-22 MED ORDER — INSULIN ASPART 100 UNIT/ML ~~LOC~~ SOLN
0.0000 [IU] | Freq: Three times a day (TID) | SUBCUTANEOUS | Status: DC
  Administered 2019-12-22: 5 [IU] via SUBCUTANEOUS
  Administered 2019-12-23 – 2019-12-25 (×8): 2 [IU] via SUBCUTANEOUS
  Administered 2019-12-25: 1 [IU] via SUBCUTANEOUS
  Administered 2019-12-26: 2 [IU] via SUBCUTANEOUS
  Administered 2019-12-26: 1 [IU] via SUBCUTANEOUS
  Administered 2019-12-26 – 2019-12-27 (×4): 2 [IU] via SUBCUTANEOUS
  Administered 2019-12-28 – 2019-12-29 (×4): 3 [IU] via SUBCUTANEOUS
  Administered 2019-12-29: 1 [IU] via SUBCUTANEOUS
  Administered 2019-12-29: 2 [IU] via SUBCUTANEOUS
  Administered 2019-12-30 (×2): 1 [IU] via SUBCUTANEOUS
  Administered 2019-12-30: 3 [IU] via SUBCUTANEOUS
  Administered 2019-12-31 – 2020-01-05 (×8): 1 [IU] via SUBCUTANEOUS
  Administered 2020-01-06: 2 [IU] via SUBCUTANEOUS
  Administered 2020-01-06: 1 [IU] via SUBCUTANEOUS

## 2019-12-22 MED ORDER — INSULIN ASPART 100 UNIT/ML ~~LOC~~ SOLN
0.0000 [IU] | Freq: Every day | SUBCUTANEOUS | Status: DC
  Administered 2019-12-25 – 2019-12-26 (×2): 2 [IU] via SUBCUTANEOUS
  Administered 2019-12-27 – 2019-12-28 (×2): 3 [IU] via SUBCUTANEOUS

## 2019-12-22 MED ORDER — ASPIRIN EC 81 MG PO TBEC
162.0000 mg | DELAYED_RELEASE_TABLET | Freq: Every day | ORAL | Status: DC
  Administered 2019-12-23 – 2020-01-07 (×16): 162 mg via ORAL
  Filled 2019-12-22 (×17): qty 2

## 2019-12-22 MED ORDER — PROCHLORPERAZINE MALEATE 5 MG PO TABS: 5.0000 mg | ORAL_TABLET | Freq: Four times a day (QID) | ORAL | Status: DC | PRN

## 2019-12-22 MED ORDER — PROCHLORPERAZINE EDISYLATE 10 MG/2ML IJ SOLN: 5.0000 mg | Freq: Four times a day (QID) | INTRAMUSCULAR | Status: DC | PRN

## 2019-12-22 MED ORDER — ACETAMINOPHEN 325 MG PO TABS
325.0000 mg | ORAL_TABLET | ORAL | Status: DC | PRN
  Administered 2019-12-23: 325 mg via ORAL
  Administered 2019-12-23 – 2020-01-04 (×12): 650 mg via ORAL
  Filled 2019-12-22 (×18): qty 2

## 2019-12-22 MED ORDER — GLUCERNA SHAKE PO LIQD: 237.0000 mL | Freq: Two times a day (BID) | ORAL | 0 refills | Status: DC

## 2019-12-22 NOTE — H&P (Addendum)
Physical Medicine and Rehabilitation Admission H&P    Chief Complaint  Patient presents with  . Functional decline     Stroke with right side weakness    HPI: Eugene Cooper is a 79 year old non-English-speaking Hispanic male with history of HTN, T2DM, dyslipidemia who was admitted on 12/19/2019 right-sided weakness and gait abnormality.  Yesterday from chart review due to language and cognition.  Patient reported 2 to 3-day history of having nausea vomiting and diarrhea as well as diffuse abdominal pain with body aches.  Respiratory panel negative.  CT head negative for acute changes.  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  CT abdomen pelvis showed hepatic steatosis with no acute abnormality.  Hepatitis panel was done due to abnormal LFTs and was negative.  MRA head/neck was negative for stenosis or dissection.  Echocardiogram showed ejection fraction of 55 to 60% with mild left ventricular hypertrophy and no wall abnormality.  ASA recommended stroke for secondary stroke prevention. EKG showed left bundle branch block and Dr. Branch/cardiology recommended Carlton Adam on outpatient basis for work-up.  Therapy evaluation showed impaired cognition with delayed recall, balance deficits and difficulty with ADLs. CIR recommended due to functional decline.  Please see preadmission assessment from earlier today as well.   Review of Systems  Unable to perform ROS: Language      Past Medical History:  Diagnosis Date  . Hyperlipemia   . Hypertension     History reviewed. No pertinent surgical history.,  Unable to obtain from patient due to language   History reviewed. No pertinent family history.,  Unable to obtain from patient due to language  Social History:  reports that he has never smoked. He has never used smokeless tobacco. He reports previous alcohol use. He reports that he does not use drugs.    Allergies: No Known Allergies     Medications Prior to Admission  Medication Sig Dispense Refill  . lisinopril-hydrochlorothiazide (PRINZIDE,ZESTORETIC) 20-25 MG tablet Take 1 tablet by mouth daily.  0  . metFORMIN (GLUCOPHAGE) 1000 MG tablet Take 1 tablet (1,000 mg total) by mouth 2 (two) times daily with a meal. 60 tablet 0  . pravastatin (PRAVACHOL) 40 MG tablet Take 40 mg by mouth at bedtime.  0  . amoxicillin-clavulanate (AUGMENTIN) 875-125 MG tablet Take 1 tablet by mouth 2 (two) times daily.      Drug Regimen Review Drug regimen was reviewed and remains appropriate with no significant issues identified  Home: Home Living Family/patient expects to be discharged to:: Private residence Living Arrangements: Children Available Help at Discharge: Family, Available PRN/intermittently Type of Home: House Home Access: Stairs to enter Technical brewer of Steps: 6 Entrance Stairs-Rails: Right Home Layout: One level Bathroom Accessibility: Yes Home Equipment: Cane - single point  Lives With: Daughter   Functional History: Prior Function Level of Independence: Independent Comments: independent with ADLs and mobility, does not drive  Functional Status:  Mobility: Bed Mobility Overal bed mobility: Needs Assistance Bed Mobility: Supine to Sit Supine to sit: Min assist Sit to supine: Supervision General bed mobility comments: increased time, requires most assistance for scooting forward at EOB Transfers Overall transfer level: Needs assistance Equipment used: Rolling walker (2 wheeled), 1 person hand held assist Transfers: Sit to/from Stand, Stand Pivot Transfers Sit to Stand: Min assist, Mod assist Stand pivot transfers: Mod assist  Lateral/Scoot Transfers: Mod assist General transfer comment: Has difficulty balancing on RLE due to weakness, drags right foot when taking steps Ambulation/Gait Ambulation/Gait  assistance: Mod assist Gait Distance (Feet): 30 Feet Assistive device: Rolling walker (2  wheeled) Gait Pattern/deviations: Decreased step length - right, Decreased stance time - right, Decreased stride length, Decreased dorsiflexion - right General Gait Details: slow labored cadence with diffiuclty advancing RLE due to dragging of toe secondary to limited ankle dorsiflexion, frequently requires tactile cueing to help advance RLE, followed with wheelchair for safety Gait velocity: decreased    ADL: ADL Overall ADL's : Needs assistance/impaired Eating/Feeding: Set up, Bed level Eating/Feeding Details (indicate cue type and reason): Assist for opening packages and cutting food Grooming: Oral care Grooming Details (indicate cue type and reason): Pt seated at EOB for teeth brushing, attempted to use RUE as assist to open toothpaste however was unable to complete. OT assisted with opening, pt then held toothbrush with right hand in lateral pinch to put paste on the brush. Used left hand to brush teeth.  Upper Body Dressing : Moderate assistance, Sitting Upper Body Dressing Details (indicate cue type and reason): OT assisting with managing gown ties at back of neck Lower Body Dressing: Maximal assistance, Sitting/lateral leans Lower Body Dressing Details (indicate cue type and reason): pt unable to successfully use RUE to assist with dressing due to weakness and coordination deficits. Also with balance deficits when seated and attempting to bend forward to reach legs/feet General ADL Comments: Pt requiring increased assistance with ADLs due to right hemiplegia  Cognition: Cognition Overall Cognitive Status: Within Functional Limits for tasks assessed Orientation Level: Oriented to person, Oriented to place, Oriented to situation Attention: Focused Focused Attention: Appears intact Memory: Impaired Awareness: Appears intact Problem Solving: Impaired Cognition Arousal/Alertness: Awake/alert Behavior During Therapy: WFL for tasks assessed/performed Overall Cognitive Status: Within  Functional Limits for tasks assessed General Comments: Requires use of interpreter  Physical Exam: Blood pressure 110/79, pulse 95, temperature 98.2 F (36.8 C), temperature source Oral, resp. rate 18, height 5\' 4"  (1.626 m), weight 69.6 kg, SpO2 96 %. Physical Exam  Vitals reviewed. Constitutional: He appears well-developed and well-nourished.  HENT:  Head: Normocephalic and atraumatic.  Eyes: Right eye exhibits no discharge. Left eye exhibits no discharge. No scleral icterus.  Dysconjugate gaze  Neck: No tracheal deviation present. No thyromegaly present.  Respiratory: Effort normal. No respiratory distress.  GI: Soft. He exhibits no distension.  Musculoskeletal:     Comments: No edema or tenderness in extremities  Neurological: He is alert.  Motor: Limited due to language, but appears to be 5/5 RUE/RLE, LUE/LLE: ?2-/5 proximal to distal  Skin: Skin is warm and dry.  Psychiatric:  Unable to assess due to language    Results for orders placed or performed during the hospital encounter of 12/19/19 (from the past 48 hour(s))  Glucose, capillary     Status: Abnormal   Collection Time: 12/20/19 11:42 AM  Result Value Ref Range   Glucose-Capillary 219 (H) 70 - 99 mg/dL  Glucose, capillary     Status: Abnormal   Collection Time: 12/20/19  4:18 PM  Result Value Ref Range   Glucose-Capillary 147 (H) 70 - 99 mg/dL  Glucose, capillary     Status: Abnormal   Collection Time: 12/20/19  9:13 PM  Result Value Ref Range   Glucose-Capillary 165 (H) 70 - 99 mg/dL   Comment 1 Notify RN    Comment 2 Document in Chart   Glucose, capillary     Status: Abnormal   Collection Time: 12/21/19  7:22 AM  Result Value Ref Range   Glucose-Capillary 161 (H) 70 -  99 mg/dL  Glucose, capillary     Status: Abnormal   Collection Time: 12/21/19 11:36 AM  Result Value Ref Range   Glucose-Capillary 209 (H) 70 - 99 mg/dL  Glucose, capillary     Status: Abnormal   Collection Time: 12/21/19  4:41 PM  Result  Value Ref Range   Glucose-Capillary 126 (H) 70 - 99 mg/dL  Glucose, capillary     Status: Abnormal   Collection Time: 12/21/19  8:49 PM  Result Value Ref Range   Glucose-Capillary 273 (H) 70 - 99 mg/dL  Glucose, capillary     Status: Abnormal   Collection Time: 12/22/19  7:47 AM  Result Value Ref Range   Glucose-Capillary 168 (H) 70 - 99 mg/dL   MR ANGIO HEAD WO CONTRAST  Result Date: 12/20/2019 CLINICAL DATA:  Right-sided weakness, facial asymmetry EXAM: MRI HEAD WITHOUT CONTRAST MRA HEAD WITHOUT CONTRAST MRA NECK WITHOUT AND WITH CONTRAST TECHNIQUE: Multiplanar, multiecho pulse sequences of the brain and surrounding structures were obtained without intravenous contrast. Angiographic images of the Circle of Willis were obtained using MRA technique without intravenous contrast. Angiographic images of the neck were obtained using MRA technique without and with intravenous contrast. Carotid stenosis measurements (when applicable) are obtained utilizing NASCET criteria, using the distal internal carotid diameter as the denominator. CONTRAST:  66mL GADAVIST GADOBUTROL 1 MMOL/ML IV SOLN COMPARISON:  None. FINDINGS: MRI HEAD FINDINGS Brain: There is reduced diffusion in the parasagittal left pons. No evidence of hemorrhage. Patchy T2 hyperintensity in the supratentorial white matter is nonspecific but may reflect mild chronic microvascular ischemic changes. Ventricles and sulci are within normal limits in size and configuration. There is no intracranial mass, mass effect, hydrocephalus, or extra-axial fluid collection. Vascular: Major vessel flow voids at the skull base are preserved. Skull and upper cervical spine: Marrow signal is unremarkable. Sinuses/Orbits: No acute abnormality. Other: None. MRA HEAD FINDINGS Intracranial internal carotid arteries are patent. Middle and anterior cerebral arteries are patent. Intracranial vertebral arteries, basilar artery, posterior cerebral arteries are patent. A left  posterior communicating artery is present. There is no significant stenosis or aneurysm. MRA NECK FINDINGS Common, internal, and external carotid arteries are patent. Extracranial vertebral arteries are patent. No hemodynamically significant stenosis or evidence of dissection. 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 No hemodynamically significant stenosis or evidence of dissection. Electronically Signed   By: Macy Mis M.D.   On: 12/20/2019 12:19   MR ANGIO NECK W WO CONTRAST  Result Date: 12/20/2019 CLINICAL DATA:  Right-sided weakness, facial asymmetry EXAM: MRI HEAD WITHOUT CONTRAST MRA HEAD WITHOUT CONTRAST MRA NECK WITHOUT AND WITH CONTRAST TECHNIQUE: Multiplanar, multiecho pulse sequences of the brain and surrounding structures were obtained without intravenous contrast. Angiographic images of the Circle of Willis were obtained using MRA technique without intravenous contrast. Angiographic images of the neck were obtained using MRA technique without and with intravenous contrast. Carotid stenosis measurements (when applicable) are obtained utilizing NASCET criteria, using the distal internal carotid diameter as the denominator. CONTRAST:  24mL GADAVIST GADOBUTROL 1 MMOL/ML IV SOLN COMPARISON:  None. FINDINGS: MRI HEAD FINDINGS Brain: There is reduced diffusion in the parasagittal left pons. No evidence of hemorrhage. Patchy T2 hyperintensity in the supratentorial white matter is nonspecific but may reflect mild chronic microvascular ischemic changes. Ventricles and sulci are within normal limits in size and configuration. There is no intracranial mass, mass effect, hydrocephalus, or extra-axial fluid collection. Vascular: Major vessel flow voids at the skull base  are preserved. Skull and upper cervical spine: Marrow signal is unremarkable. Sinuses/Orbits: No acute abnormality. Other: None. MRA HEAD FINDINGS Intracranial internal carotid  arteries are patent. Middle and anterior cerebral arteries are patent. Intracranial vertebral arteries, basilar artery, posterior cerebral arteries are patent. A left posterior communicating artery is present. There is no significant stenosis or aneurysm. MRA NECK FINDINGS Common, internal, and external carotid arteries are patent. Extracranial vertebral arteries are patent. No hemodynamically significant stenosis or evidence of dissection. 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 No hemodynamically significant stenosis or evidence of dissection. Electronically Signed   By: Macy Mis M.D.   On: 12/20/2019 12:19   MR BRAIN WO CONTRAST  Result Date: 12/20/2019 CLINICAL DATA:  Right-sided weakness, facial asymmetry EXAM: MRI HEAD WITHOUT CONTRAST MRA HEAD WITHOUT CONTRAST MRA NECK WITHOUT AND WITH CONTRAST TECHNIQUE: Multiplanar, multiecho pulse sequences of the brain and surrounding structures were obtained without intravenous contrast. Angiographic images of the Circle of Willis were obtained using MRA technique without intravenous contrast. Angiographic images of the neck were obtained using MRA technique without and with intravenous contrast. Carotid stenosis measurements (when applicable) are obtained utilizing NASCET criteria, using the distal internal carotid diameter as the denominator. CONTRAST:  9mL GADAVIST GADOBUTROL 1 MMOL/ML IV SOLN COMPARISON:  None. FINDINGS: MRI HEAD FINDINGS Brain: There is reduced diffusion in the parasagittal left pons. No evidence of hemorrhage. Patchy T2 hyperintensity in the supratentorial white matter is nonspecific but may reflect mild chronic microvascular ischemic changes. Ventricles and sulci are within normal limits in size and configuration. There is no intracranial mass, mass effect, hydrocephalus, or extra-axial fluid collection. Vascular: Major vessel flow voids at the skull base are preserved.  Skull and upper cervical spine: Marrow signal is unremarkable. Sinuses/Orbits: No acute abnormality. Other: None. MRA HEAD FINDINGS Intracranial internal carotid arteries are patent. Middle and anterior cerebral arteries are patent. Intracranial vertebral arteries, basilar artery, posterior cerebral arteries are patent. A left posterior communicating artery is present. There is no significant stenosis or aneurysm. MRA NECK FINDINGS Common, internal, and external carotid arteries are patent. Extracranial vertebral arteries are patent. No hemodynamically significant stenosis or evidence of dissection. 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 No hemodynamically significant stenosis or evidence of dissection. Electronically Signed   By: Macy Mis M.D.   On: 12/20/2019 12:19   ECHOCARDIOGRAM COMPLETE  Result Date: 12/20/2019   ECHOCARDIOGRAM REPORT   Patient Name:   IBAAD KHOURY Date of Exam: 12/20/2019 Medical Rec #:  [id number]          Height:       64.0 in Accession #:    [id number]         Weight:       153.4 lb Date of Birth:  [birth year]          BSA:          1.75 m Patient Age:    34 years           BP:           131/85 mmHg Patient Gender: M                  HR:           65 bpm. Exam Location:  [facility] Procedure: 2D Echo, Cardiac Doppler and Color Doppler Indications:    Stroke 434.91 / I163.9  History:  Patient has no prior history of Echocardiogram examinations.                 Risk Factors:Hypertension and Dyslipidemia.  Sonographer:    Alvino Chapel RCS Referring Phys: [phone] [place]  1. Left ventricular ejection fraction, by visual estimation, is 55 to 60%. The left ventricle has normal function. There is mildly increased left ventricular hypertrophy.  2. Left ventricular diastolic parameters are consistent with Grade I diastolic dysfunction (impaired relaxation).  3. The left ventricle has no  regional wall motion abnormalities.  4. Global right ventricle has normal systolic function.The right ventricular size is normal. No increase in right ventricular wall thickness.  5. Left atrial size was normal.  6. Right atrial size was normal.  7. Presence of pericardial fat pad.  8. Mild mitral annular calcification.  9. The mitral valve is grossly normal. No evidence of mitral valve regurgitation. 10. The tricuspid valve is grossly normal. 11. The aortic valve is tricuspid. Aortic valve regurgitation is not visualized. 12. The pulmonic valve was grossly normal. Pulmonic valve regurgitation is not visualized. 13. Aortic dilatation noted. 14. 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 15. Mildly elevated pulmonary artery systolic pressure. 16. The tricuspid regurgitant velocity is 2.61 m/s, and with an assumed right atrial pressure of 3 mmHg, the estimated right ventricular systolic pressure is mildly elevated at 30.2 mmHg. 17. The inferior vena cava is normal in size with greater than 50% respiratory variability, suggesting right atrial pressure of 3 mmHg. FINDINGS  Left Ventricle: Left ventricular ejection fraction, by visual estimation, is 55 to 60%. The left ventricle has normal function. The left ventricle has no regional wall motion abnormalities. The left ventricular internal cavity size was the left ventricle is normal in size. There is mildly increased left ventricular hypertrophy. Left ventricular diastolic parameters are consistent with Grade I diastolic dysfunction (impaired relaxation). Right Ventricle: The right ventricular size is normal. No increase in right ventricular wall thickness. Global RV systolic function is has normal systolic function. The tricuspid regurgitant velocity is 2.61 m/s, and with an assumed right atrial pressure  of 3 mmHg, the estimated right ventricular systolic pressure is mildly elevated at 30.2 mmHg. Left Atrium: Left atrial size was normal in size. Right Atrium: Right  atrial size was normal in size Pericardium: There is no evidence of pericardial effusion. Presence of pericardial fat pad. Mitral Valve: The mitral valve is grossly normal. Mild mitral annular calcification. No evidence of mitral valve regurgitation. Tricuspid Valve: The tricuspid valve is grossly normal. Tricuspid valve regurgitation is mild. Aortic Valve: The aortic valve is tricuspid. Aortic valve regurgitation is not visualized. Mild aortic valve annular calcification. Pulmonic Valve: The pulmonic valve was grossly normal. Pulmonic valve regurgitation is not visualized. Pulmonic regurgitation is not visualized. Aorta: Aortic dilatation noted. 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 Venous: The inferior vena cava is normal in size with greater than 50% respiratory variability, suggesting right atrial pressure of 3 mmHg. IAS/Shunts: No atrial level shunt detected by color flow Doppler.  LEFT VENTRICLE PLAX 2D LVIDd:         4.09 cm       Diastology LVIDs:         2.70 cm       LV e' lateral:   5.22 cm/s LV PW:         1.13 cm       LV E/e' lateral: 10.5 LV IVS:  1.23 cm       LV e' medial:    4.68 cm/s LVOT diam:     1.90 cm       LV E/e' medial:  11.7 LV SV:         47 ml LV SV Index:   26.16 LVOT Area:     2.84 cm  LV Volumes (MOD) LV area d, A2C:    19.10 cm LV area d, A4C:    20.30 cm LV area s, A2C:    10.60 cm LV area s, A4C:    10.90 cm LV major d, A2C:   7.02 cm LV major d, A4C:   6.64 cm LV major s, A2C:   5.14 cm LV major s, A4C:   4.99 cm LV vol d, MOD A2C: 43.6 ml LV vol d, MOD A4C: 52.7 ml LV vol s, MOD A2C: 19.0 ml LV vol s, MOD A4C: 21.1 ml LV SV MOD A2C:     24.6 ml LV SV MOD A4C:     52.7 ml LV SV MOD BP:      28.8 ml RIGHT VENTRICLE RV S prime:     13.30 cm/s TAPSE (M-mode): 1.5 cm LEFT ATRIUM             Index LA diam:        3.10 cm 1.77 cm/m LA Vol (A2C):   30.6 ml 17.51 ml/m LA Vol (A4C):   35.6 ml 20.37 ml/m LA Biplane Vol: 36.4 ml 20.82 ml/m  AORTIC VALVE LVOT Vmax:    102.00 cm/s LVOT Vmean:  65.000 cm/s LVOT VTI:    0.187 m  AORTA Ao Root diam: 4.20 cm MITRAL VALVE                        TRICUSPID VALVE MV Area (PHT): 2.45 cm             TR Peak grad:   27.2 mmHg MV PHT:        89.90 msec           TR Vmax:        261.00 cm/s MV Decel Time: 310 msec MV E velocity: 54.80 cm/s 103 cm/s  SHUNTS MV A velocity: 81.40 cm/s 70.3 cm/s Systemic VTI:  0.19 m MV E/A ratio:  0.67       1.5       Systemic Diam: 1.90 cm  Rozann Lesches MD Electronically signed by Rozann Lesches MD Signature Date/Time: 12/20/2019/3:36:00 PM    Final        Medical Problem List and Plan: 1.  Impaired cognition with delayed recall, balance deficits, left hemiparesis, difficulty with ADLs secondary to acute left parasagittal pons infarct.  -patient may may shower  -ELOS/Goals: 14-18 days/Supervision/min A.  Admit to CIR 2.  Antithrombotics: -DVT/anticoagulation:  Pharmaceutical: Lovenox  -antiplatelet therapy:  ASA daily.  3. Pain Management: Tylenol as needed 4. Mood: LCSW to follow for evaluation and support  -antipsychotic agents: N/A 5. Neuropsych: This patient is?  Fully capable of making decisions on his own behalf. 6. Skin/Wound Care: Routine pressure relief measures.  7. Fluids/Electrolytes/Nutrition: Monitor I/O.  CMP ordered for tomorrow a.m. 8. HTN: Monitor blood pressures 3 times daily.  Permissive hypertension and resume Prinivil/hctz as indicated.   Monitor with increased mobility 9.T2DM with hyperglycemia: Metformin on hold due to elevation in creatinine--monitor blood sugars ac/hs and titrate as indicated.  Monitor with increased mobility 10.Dyslipidemia: Pravastatin changed to  atorvastatin for aggressive management. 11. LBBB: Follow up with cardiology for [facility] after discharge.   Bary Leriche, PA-C 12/22/2019  I have personally performed a face to face diagnostic evaluation, including, but not limited to relevant history and physical exam findings, of this  patient and developed relevant assessment and plan.  Additionally, I have reviewed and concur with the physician assistant's documentation above.  Delice Lesch, MD, ABPMR  The patient's status has not changed. The original post admission physician evaluation remains appropriate, and any changes from the pre-admission screening or documentation from the acute chart are noted above.   Delice Lesch, MD, ABPMR

## 2019-12-22 NOTE — Discharge Summary (Addendum)
Physician Discharge Summary  Eugene Cooper [id number] DOB: 1941/02/15 DOA: 12/19/2019  PCP: Arnoldo Lenis, MD  Admit date: 12/19/2019 Discharge date: 12/22/2019  Admitted From: Home Disposition:  CIR  Recommendations for Outpatient Follow-up:  1. Follow up with PCP in 1-2 weeks 2. Please obtain BMP/CBC in one week     Discharge Condition: Stable CODE STATUS: FULL Diet recommendation: Heart Healthy / Carb Modified   Brief/Interim Summary: 79 y.o. M with HTN, DM, and hyperlipidemia who presented with few days N/V/D and abdominal pain and then acute right hemiparesis.  Evidently, patient had had a few days of nausea and vomiting and malaise, as well as right arm weakness.  By the day of admission, his abdominal symptoms improved, but his right hemiparesis persisted, he couldn't walk, so came to ER.  In the ER, CT head normal.  Appeared still weak on the right side.  Tele-neuro were consulted, did not recommend tPA given symptoms present >24 hours.  The was admitted for full stroke work up.  He was seen by neurology, Dr. Merlene Laughter who recommended ASA 162 mg daily and optimizing secondary prevention for stroke.  MRI brain showed an acute infarction of the left parasagittal pons.  Discharge Diagnoses:  Left pons acute ischemic stroke -Non-invasive angiography showed no significant stenosis -Echocardiogram ordered and results as mentioned below; no source of thrombi appreciated; no PFO -Carotid imaging showed no stenoses on MRA neck   -Lipids ordered: Continue Lipitor 40 mg now for better control of his cholesterol. -After case discussed with neurology recommendation-given 162 mg of aspirin on daily basis for secondary prevention. -No telemetry abnormalities appreciated.  Continue monitoring while inpatient. -tPA not given because symptoms present >24 hours (out of therapeutic window). -PT eval ordered: recommended CIR -Outpatient follow-up with neurology in 4  weeks.  Hypertension -Allowing permissive hypertension in the setting of acute stroke -Lisinopril HCTZ was held -His blood pressure remains well controlled -At the time of discharge, the patient will be restarted back on lisinopril lower dose 5 mg daily.  He was previously taking lisinopril/HCTZ 20/25  Diabetes mellitus type II, uncontrolled with hyperglycemia -Hemoglobin A1c 8.2 -Continue sliding scale insulin while inpatient -Continue holding oral hypoglycemic agents.   -When the patient goes home, he will restart Metformin -Glipizide 5 mg daily will be added at the time of discharge--certainly, this can be titrated up for effect for glycemic control  CKD IIIa -Stable and at baseline at this moment. -Baseline creatinine 1.3-1.4 -Recommend repeat of basic metabolic panel intermittently to follow creatinine trend.    N/V/D -CT abdomen unremarkable except for fatty liver.  -Secondary to exacerbation of gastroparesis due to acute medical illness  -These symptoms resolved.  He is tolerating his diet. -No acute distress or complaints currently.  New LBBB -No clear chest pain complaints at this time.   -Troponins undetectable. -Cardiology were consulted on admission.  They have evaluated patient and recommend outpatient Lexiscan. -Patient remained stable. -12/20/2019 echo EF 55 to 60%, grade 1 DD, no PFO  Hyperlipidemia -LDL 169 -Patient was on pravastatin prior to admission -Patient will be discharged on Lipitor 40 mg daily -Repeat lipid panel in 3 months  Discharge Instructions   Allergies as of 12/22/2019   No Known Allergies     Medication List    STOP taking these medications   amoxicillin-clavulanate 875-125 MG tablet Commonly known as: AUGMENTIN   lisinopril-hydrochlorothiazide 20-25 MG tablet Commonly known as: ZESTORETIC   pravastatin 40 MG tablet Commonly known as: PRAVACHOL  TAKE these medications   aspirin 81 MG EC tablet Take 2 tablets (162  mg total) by mouth daily.   atorvastatin 40 MG tablet Commonly known as: LIPITOR Take 1 tablet (40 mg total) by mouth daily at 6 PM.   feeding supplement (GLUCERNA SHAKE) Liqd Take 237 mLs by mouth 2 (two) times daily between meals.   glipiZIDE 5 MG tablet Commonly known as: GLUCOTROL Take 1 tablet (5 mg total) by mouth daily before breakfast.   lisinopril 5 MG tablet Commonly known as: ZESTRIL Take 1 tablet (5 mg total) by mouth daily.   metFORMIN 1000 MG tablet Commonly known as: GLUCOPHAGE Take 1 tablet (1,000 mg total) by mouth 2 (two) times daily with a meal.       No Known Allergies  Consultations:  Neurology  cardiology   Procedures/Studies: CT Head Wo Contrast  Result Date: 12/19/2019 CLINICAL DATA:  Right-sided weakness EXAM: CT HEAD WITHOUT CONTRAST TECHNIQUE: Contiguous axial images were obtained from the base of the skull through the vertex without intravenous contrast. COMPARISON:  None. FINDINGS: Brain: There is no acute intracranial hemorrhage, mass-effect, or edema. Gray-white differentiation is preserved. There is no extra-axial fluid collection. Ventricles and sulci are within normal limits in size and configuration. Patchy hypoattenuation in the supratentorial white matter is nonspecific but may reflect mild chronic microvascular ischemic changes. Vascular: There is atherosclerotic calcification at the skull base. Skull: Calvarium is unremarkable. Sinuses/Orbits: Focal opacification of a left ethmoid air cell. Orbits are unremarkable. Other: Mastoid air cells are clear. IMPRESSION: No acute intracranial hemorrhage, mass effect, or evidence of acute infarction. Mild chronic microvascular ischemic changes. Electronically Signed   By: Macy Mis M.D.   On: 12/19/2019 16:09   MR ANGIO HEAD WO CONTRAST  Result Date: 12/20/2019 CLINICAL DATA:  Right-sided weakness, facial asymmetry EXAM: MRI HEAD WITHOUT CONTRAST MRA HEAD WITHOUT CONTRAST MRA NECK WITHOUT AND  WITH CONTRAST TECHNIQUE: Multiplanar, multiecho pulse sequences of the brain and surrounding structures were obtained without intravenous contrast. Angiographic images of the Circle of Willis were obtained using MRA technique without intravenous contrast. Angiographic images of the neck were obtained using MRA technique without and with intravenous contrast. Carotid stenosis measurements (when applicable) are obtained utilizing NASCET criteria, using the distal internal carotid diameter as the denominator. CONTRAST:  62mL GADAVIST GADOBUTROL 1 MMOL/ML IV SOLN COMPARISON:  None. FINDINGS: MRI HEAD FINDINGS Brain: There is reduced diffusion in the parasagittal left pons. No evidence of hemorrhage. Patchy T2 hyperintensity in the supratentorial white matter is nonspecific but may reflect mild chronic microvascular ischemic changes. Ventricles and sulci are within normal limits in size and configuration. There is no intracranial mass, mass effect, hydrocephalus, or extra-axial fluid collection. Vascular: Major vessel flow voids at the skull base are preserved. Skull and upper cervical spine: Marrow signal is unremarkable. Sinuses/Orbits: No acute abnormality. Other: None. MRA HEAD FINDINGS Intracranial internal carotid arteries are patent. Middle and anterior cerebral arteries are patent. Intracranial vertebral arteries, basilar artery, posterior cerebral arteries are patent. A left posterior communicating artery is present. There is no significant stenosis or aneurysm. MRA NECK FINDINGS Common, internal, and external carotid arteries are patent. Extracranial vertebral arteries are patent. No hemodynamically significant stenosis or evidence of dissection. 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 No hemodynamically significant stenosis or evidence of dissection. Electronically Signed   By: Macy Mis M.D.   On: 12/20/2019 12:19   MR ANGIO NECK W WO  CONTRAST  Result Date:  12/20/2019 CLINICAL DATA:  Right-sided weakness, facial asymmetry EXAM: MRI HEAD WITHOUT CONTRAST MRA HEAD WITHOUT CONTRAST MRA NECK WITHOUT AND WITH CONTRAST TECHNIQUE: Multiplanar, multiecho pulse sequences of the brain and surrounding structures were obtained without intravenous contrast. Angiographic images of the Circle of Willis were obtained using MRA technique without intravenous contrast. Angiographic images of the neck were obtained using MRA technique without and with intravenous contrast. Carotid stenosis measurements (when applicable) are obtained utilizing NASCET criteria, using the distal internal carotid diameter as the denominator. CONTRAST:  7mL GADAVIST GADOBUTROL 1 MMOL/ML IV SOLN COMPARISON:  None. FINDINGS: MRI HEAD FINDINGS Brain: There is reduced diffusion in the parasagittal left pons. No evidence of hemorrhage. Patchy T2 hyperintensity in the supratentorial white matter is nonspecific but may reflect mild chronic microvascular ischemic changes. Ventricles and sulci are within normal limits in size and configuration. There is no intracranial mass, mass effect, hydrocephalus, or extra-axial fluid collection. Vascular: Major vessel flow voids at the skull base are preserved. Skull and upper cervical spine: Marrow signal is unremarkable. Sinuses/Orbits: No acute abnormality. Other: None. MRA HEAD FINDINGS Intracranial internal carotid arteries are patent. Middle and anterior cerebral arteries are patent. Intracranial vertebral arteries, basilar artery, posterior cerebral arteries are patent. A left posterior communicating artery is present. There is no significant stenosis or aneurysm. MRA NECK FINDINGS Common, internal, and external carotid arteries are patent. Extracranial vertebral arteries are patent. No hemodynamically significant stenosis or evidence of dissection. IMPRESSION: Acute infarction of the parasagittal left pons. Mild chronic microvascular ischemic  changes. No hemodynamically significant stenosis or evidence of dissection. Electronically Signed   By: Macy Mis M.D.   On: 12/20/2019 12:19   MR BRAIN WO CONTRAST  Result Date: 12/20/2019 CLINICAL DATA:  Right-sided weakness, facial asymmetry EXAM: MRI HEAD WITHOUT CONTRAST MRA HEAD WITHOUT CONTRAST MRA NECK WITHOUT AND WITH CONTRAST TECHNIQUE: Multiplanar, multiecho pulse sequences of the brain and surrounding structures were obtained without intravenous contrast. Angiographic images of the Circle of Willis were obtained using MRA technique without intravenous contrast. Angiographic images of the neck were obtained using MRA technique without and with intravenous contrast. Carotid stenosis measurements (when applicable) are obtained utilizing NASCET criteria, using the distal internal carotid diameter as the denominator. CONTRAST:  50mL GADAVIST GADOBUTROL 1 MMOL/ML IV SOLN COMPARISON:  None. FINDINGS: MRI HEAD FINDINGS Brain: There is reduced diffusion in the parasagittal left pons. No evidence of hemorrhage. Patchy T2 hyperintensity in the supratentorial white matter is nonspecific but may reflect mild chronic microvascular ischemic changes. Ventricles and sulci are within normal limits in size and configuration. There is no intracranial mass, mass effect, hydrocephalus, or extra-axial fluid collection. Vascular: Major vessel flow voids at the skull base are preserved. Skull and upper cervical spine: Marrow signal is unremarkable. Sinuses/Orbits: No acute abnormality. Other: None. MRA HEAD FINDINGS Intracranial internal carotid arteries are patent. Middle and anterior cerebral arteries are patent. Intracranial vertebral arteries, basilar artery, posterior cerebral arteries are patent. A left posterior communicating artery is present. There is no significant stenosis or aneurysm. MRA NECK FINDINGS Common, internal, and external carotid arteries are patent. Extracranial vertebral arteries are patent. No  hemodynamically significant stenosis or evidence of dissection. 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 No hemodynamically significant stenosis or evidence of dissection. Electronically Signed   By: Macy Mis M.D.   On: 12/20/2019 12:19   CT ABDOMEN PELVIS W CONTRAST  Result Date: 12/20/2019 CLINICAL DATA:  Abdominal pain with vomiting and diarrhea EXAM:  CT ABDOMEN AND PELVIS WITH CONTRAST TECHNIQUE: Multidetector CT imaging of the abdomen and pelvis was performed using the standard protocol following bolus administration of intravenous contrast. CONTRAST:  128mL OMNIPAQUE IOHEXOL 300 MG/ML  SOLN COMPARISON:  None. FINDINGS: LOWER CHEST: Cardiomegaly and bibasilar atelectasis. HEPATOBILIARY: Diffuse hypoattenuation of the liver relative to the spleen suggests hepatic steatosis. No focal liver lesion or biliary dilatation. Normal gallbladder. PANCREAS: Normal pancreas. No ductal dilatation or peripancreatic fluid collection. SPLEEN: Normal. ADRENALS/URINARY TRACT: The adrenal glands are normal. No hydronephrosis, nephroureterolithiasis or solid renal mass. The urinary bladder is normal for degree of distention STOMACH/BOWEL: There is no hiatal hernia. Normal duodenal course and caliber. No small bowel dilatation or inflammation. No focal colonic abnormality. Normal appendix. VASCULAR/LYMPHATIC: There is calcific atherosclerosis of the abdominal aorta. No abdominal or pelvic lymphadenopathy. REPRODUCTIVE: Normal prostate size with symmetric seminal vesicles. MUSCULOSKELETAL. No bony spinal canal stenosis or focal osseous abnormality. OTHER: None. IMPRESSION: 1. No acute abnormality of the abdomen or pelvis. 2. Hepatic steatosis. 3. Aortic Atherosclerosis (ICD10-I70.0). Electronically Signed   By: Ulyses Jarred M.D.   On: 12/20/2019 01:13   ECHOCARDIOGRAM COMPLETE  Result Date: 12/20/2019   ECHOCARDIOGRAM REPORT   Patient Name:   Eugene Cooper Date of Exam: 12/20/2019 Medical Rec #:  [id number]          Height:       64.0 in Accession #:    [id number]         Weight:       153.4 lb Date of Birth:  Jun 25, 1941          BSA:          1.75 m Patient Age:    74 years           BP:           131/85 mmHg Patient Gender: M                  HR:           65 bpm. Exam Location:  [facility] Procedure: 2D Echo, Cardiac Doppler and Color Doppler Indications:    Stroke 434.91 / I163.9  History:        Patient has no prior history of Echocardiogram examinations.                 Risk Factors:Hypertension and Dyslipidemia.  Sonographer:    Alvino Chapel RCS Referring Phys: [phone] [place]  1. Left ventricular ejection fraction, by visual estimation, is 55 to 60%. The left ventricle has normal function. There is mildly increased left ventricular hypertrophy.  2. Left ventricular diastolic parameters are consistent with Grade I diastolic dysfunction (impaired relaxation).  3. The left ventricle has no regional wall motion abnormalities.  4. Global right ventricle has normal systolic function.The right ventricular size is normal. No increase in right ventricular wall thickness.  5. Left atrial size was normal.  6. Right atrial size was normal.  7. Presence of pericardial fat pad.  8. Mild mitral annular calcification.  9. The mitral valve is grossly normal. No evidence of mitral valve regurgitation. 10. The tricuspid valve is grossly normal. 11. The aortic valve is tricuspid. Aortic valve regurgitation is not visualized. 12. The pulmonic valve was grossly normal. Pulmonic valve regurgitation is not visualized. 13. Aortic dilatation noted. 14. 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 15. Mildly elevated pulmonary artery systolic pressure. 16. The tricuspid regurgitant velocity is 2.61 m/s, and with  an assumed right atrial pressure of 3 mmHg, the estimated right ventricular systolic pressure is mildly elevated at 30.2 mmHg. 17. The inferior vena  cava is normal in size with greater than 50% respiratory variability, suggesting right atrial pressure of 3 mmHg. FINDINGS  Left Ventricle: Left ventricular ejection fraction, by visual estimation, is 55 to 60%. The left ventricle has normal function. The left ventricle has no regional wall motion abnormalities. The left ventricular internal cavity size was the left ventricle is normal in size. There is mildly increased left ventricular hypertrophy. Left ventricular diastolic parameters are consistent with Grade I diastolic dysfunction (impaired relaxation). Right Ventricle: The right ventricular size is normal. No increase in right ventricular wall thickness. Global RV systolic function is has normal systolic function. The tricuspid regurgitant velocity is 2.61 m/s, and with an assumed right atrial pressure  of 3 mmHg, the estimated right ventricular systolic pressure is mildly elevated at 30.2 mmHg. Left Atrium: Left atrial size was normal in size. Right Atrium: Right atrial size was normal in size Pericardium: There is no evidence of pericardial effusion. Presence of pericardial fat pad. Mitral Valve: The mitral valve is grossly normal. Mild mitral annular calcification. No evidence of mitral valve regurgitation. Tricuspid Valve: The tricuspid valve is grossly normal. Tricuspid valve regurgitation is mild. Aortic Valve: The aortic valve is tricuspid. Aortic valve regurgitation is not visualized. Mild aortic valve annular calcification. Pulmonic Valve: The pulmonic valve was grossly normal. Pulmonic valve regurgitation is not visualized. Pulmonic regurgitation is not visualized. Aorta: Aortic dilatation noted. 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 Venous: The inferior vena cava is normal in size with greater than 50% respiratory variability, suggesting right atrial pressure of 3 mmHg. IAS/Shunts: No atrial level shunt detected by color flow Doppler.  LEFT VENTRICLE PLAX 2D LVIDd:         4.09 cm        Diastology LVIDs:         2.70 cm       LV e' lateral:   5.22 cm/s LV PW:         1.13 cm       LV E/e' lateral: 10.5 LV IVS:        1.23 cm       LV e' medial:    4.68 cm/s LVOT diam:     1.90 cm       LV E/e' medial:  11.7 LV SV:         47 ml LV SV Index:   26.16 LVOT Area:     2.84 cm  LV Volumes (MOD) LV area d, A2C:    19.10 cm LV area d, A4C:    20.30 cm LV area s, A2C:    10.60 cm LV area s, A4C:    10.90 cm LV major d, A2C:   7.02 cm LV major d, A4C:   6.64 cm LV major s, A2C:   5.14 cm LV major s, A4C:   4.99 cm LV vol d, MOD A2C: 43.6 ml LV vol d, MOD A4C: 52.7 ml LV vol s, MOD A2C: 19.0 ml LV vol s, MOD A4C: 21.1 ml LV SV MOD A2C:     24.6 ml LV SV MOD A4C:     52.7 ml LV SV MOD BP:      28.8 ml RIGHT VENTRICLE RV S prime:     13.30 cm/s TAPSE (M-mode): 1.5 cm LEFT ATRIUM  Index LA diam:        3.10 cm 1.77 cm/m LA Vol (A2C):   30.6 ml 17.51 ml/m LA Vol (A4C):   35.6 ml 20.37 ml/m LA Biplane Vol: 36.4 ml 20.82 ml/m  AORTIC VALVE LVOT Vmax:   102.00 cm/s LVOT Vmean:  65.000 cm/s LVOT VTI:    0.187 m  AORTA Ao Root diam: 4.20 cm MITRAL VALVE                        TRICUSPID VALVE MV Area (PHT): 2.45 cm             TR Peak grad:   27.2 mmHg MV PHT:        89.90 msec           TR Vmax:        261.00 cm/s MV Decel Time: 310 msec MV E velocity: 54.80 cm/s 103 cm/s  SHUNTS MV A velocity: 81.40 cm/s 70.3 cm/s Systemic VTI:  0.19 m MV E/A ratio:  0.67       1.5       Systemic Diam: 1.90 cm  Rozann Lesches MD Electronically signed by Rozann Lesches MD Signature Date/Time: 12/20/2019/3:36:00 PM    Final          Discharge Exam: Vitals:   12/22/19 0200 12/22/19 0600  BP: 100/65 110/79  Pulse: 83 95  Resp: 16 18  Temp: 98.2 F (36.8 C) 98.2 F (36.8 C)  SpO2: 96% 96%   Vitals:   12/21/19 2049 12/21/19 2200 12/22/19 0200 12/22/19 0600  BP:  (!) 130/93 100/65 110/79  Pulse:  100 83 95  Resp:  18 16 18   Temp:  98.4 F (36.9 C) 98.2 F (36.8 C) 98.2 F (36.8 C)  TempSrc:   Oral Oral Oral  SpO2: 96% 96% 96% 96%  Weight:      Height:        General: Pt is alert, awake, not in acute distress Cardiovascular: RRR, S1/S2 +, no rubs, no gallops Respiratory: CTA bilaterally, no wheezing, no rhonchi Abdominal: Soft, NT, ND, bowel sounds + Extremities: no edema, no cyanosis   The results of significant diagnostics from this hospitalization (including imaging, microbiology, ancillary and laboratory) are listed below for reference.    Significant Diagnostic Studies: CT Head Wo Contrast  Result Date: 12/19/2019 CLINICAL DATA:  Right-sided weakness EXAM: CT HEAD WITHOUT CONTRAST TECHNIQUE: Contiguous axial images were obtained from the base of the skull through the vertex without intravenous contrast. COMPARISON:  None. FINDINGS: Brain: There is no acute intracranial hemorrhage, mass-effect, or edema. Gray-white differentiation is preserved. There is no extra-axial fluid collection. Ventricles and sulci are within normal limits in size and configuration. Patchy hypoattenuation in the supratentorial white matter is nonspecific but may reflect mild chronic microvascular ischemic changes. Vascular: There is atherosclerotic calcification at the skull base. Skull: Calvarium is unremarkable. Sinuses/Orbits: Focal opacification of a left ethmoid air cell. Orbits are unremarkable. Other: Mastoid air cells are clear. IMPRESSION: No acute intracranial hemorrhage, mass effect, or evidence of acute infarction. Mild chronic microvascular ischemic changes. Electronically Signed   By: Macy Mis M.D.   On: 12/19/2019 16:09   MR ANGIO HEAD WO CONTRAST  Result Date: 12/20/2019 CLINICAL DATA:  Right-sided weakness, facial asymmetry EXAM: MRI HEAD WITHOUT CONTRAST MRA HEAD WITHOUT CONTRAST MRA NECK WITHOUT AND WITH CONTRAST TECHNIQUE: Multiplanar, multiecho pulse sequences of the brain and surrounding structures were obtained without intravenous contrast. Angiographic images of  the Circle of  [place] were obtained using MRA technique without intravenous contrast. Angiographic images of the neck were obtained using MRA technique without and with intravenous contrast. Carotid stenosis measurements (when applicable) are obtained utilizing NASCET criteria, using the distal internal carotid diameter as the denominator. CONTRAST:  36mL GADAVIST GADOBUTROL 1 MMOL/ML IV SOLN COMPARISON:  None. FINDINGS: MRI HEAD FINDINGS Brain: There is reduced diffusion in the parasagittal left pons. No evidence of hemorrhage. Patchy T2 hyperintensity in the supratentorial white matter is nonspecific but may reflect mild chronic microvascular ischemic changes. Ventricles and sulci are within normal limits in size and configuration. There is no intracranial mass, mass effect, hydrocephalus, or extra-axial fluid collection. Vascular: Major vessel flow voids at the skull base are preserved. Skull and upper cervical spine: Marrow signal is unremarkable. Sinuses/Orbits: No acute abnormality. Other: None. MRA HEAD FINDINGS Intracranial internal carotid arteries are patent. Middle and anterior cerebral arteries are patent. Intracranial vertebral arteries, basilar artery, posterior cerebral arteries are patent. A left posterior communicating artery is present. There is no significant stenosis or aneurysm. MRA NECK FINDINGS Common, internal, and external carotid arteries are patent. Extracranial vertebral arteries are patent. No hemodynamically significant stenosis or evidence of dissection. 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 No hemodynamically significant stenosis or evidence of dissection. Electronically Signed   By: Macy Mis M.D.   On: 12/20/2019 12:19   MR ANGIO NECK W WO CONTRAST  Result Date: 12/20/2019 CLINICAL DATA:  Right-sided weakness, facial asymmetry EXAM: MRI HEAD WITHOUT CONTRAST MRA HEAD WITHOUT CONTRAST MRA NECK WITHOUT AND WITH CONTRAST  TECHNIQUE: Multiplanar, multiecho pulse sequences of the brain and surrounding structures were obtained without intravenous contrast. Angiographic images of the Circle of Willis were obtained using MRA technique without intravenous contrast. Angiographic images of the neck were obtained using MRA technique without and with intravenous contrast. Carotid stenosis measurements (when applicable) are obtained utilizing NASCET criteria, using the distal internal carotid diameter as the denominator. CONTRAST:  86mL GADAVIST GADOBUTROL 1 MMOL/ML IV SOLN COMPARISON:  None. FINDINGS: MRI HEAD FINDINGS Brain: There is reduced diffusion in the parasagittal left pons. No evidence of hemorrhage. Patchy T2 hyperintensity in the supratentorial white matter is nonspecific but may reflect mild chronic microvascular ischemic changes. Ventricles and sulci are within normal limits in size and configuration. There is no intracranial mass, mass effect, hydrocephalus, or extra-axial fluid collection. Vascular: Major vessel flow voids at the skull base are preserved. Skull and upper cervical spine: Marrow signal is unremarkable. Sinuses/Orbits: No acute abnormality. Other: None. MRA HEAD FINDINGS Intracranial internal carotid arteries are patent. Middle and anterior cerebral arteries are patent. Intracranial vertebral arteries, basilar artery, posterior cerebral arteries are patent. A left posterior communicating artery is present. There is no significant stenosis or aneurysm. MRA NECK FINDINGS Common, internal, and external carotid arteries are patent. Extracranial vertebral arteries are patent. No hemodynamically significant stenosis or evidence of dissection. 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 No hemodynamically significant stenosis or evidence of dissection. Electronically Signed   By: Macy Mis M.D.   On: 12/20/2019 12:19   MR BRAIN WO CONTRAST  Result Date:  12/20/2019 CLINICAL DATA:  Right-sided weakness, facial asymmetry EXAM: MRI HEAD WITHOUT CONTRAST MRA HEAD WITHOUT CONTRAST MRA NECK WITHOUT AND WITH CONTRAST TECHNIQUE: Multiplanar, multiecho pulse sequences of the brain and surrounding structures were obtained without intravenous contrast. Angiographic images of the Circle of Willis were obtained using MRA technique  without intravenous contrast. Angiographic images of the neck were obtained using MRA technique without and with intravenous contrast. Carotid stenosis measurements (when applicable) are obtained utilizing NASCET criteria, using the distal internal carotid diameter as the denominator. CONTRAST:  71mL GADAVIST GADOBUTROL 1 MMOL/ML IV SOLN COMPARISON:  None. FINDINGS: MRI HEAD FINDINGS Brain: There is reduced diffusion in the parasagittal left pons. No evidence of hemorrhage. Patchy T2 hyperintensity in the supratentorial white matter is nonspecific but may reflect mild chronic microvascular ischemic changes. Ventricles and sulci are within normal limits in size and configuration. There is no intracranial mass, mass effect, hydrocephalus, or extra-axial fluid collection. Vascular: Major vessel flow voids at the skull base are preserved. Skull and upper cervical spine: Marrow signal is unremarkable. Sinuses/Orbits: No acute abnormality. Other: None. MRA HEAD FINDINGS Intracranial internal carotid arteries are patent. Middle and anterior cerebral arteries are patent. Intracranial vertebral arteries, basilar artery, posterior cerebral arteries are patent. A left posterior communicating artery is present. There is no significant stenosis or aneurysm. MRA NECK FINDINGS Common, internal, and external carotid arteries are patent. Extracranial vertebral arteries are patent. No hemodynamically significant stenosis or evidence of dissection. 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 No hemodynamically  significant stenosis or evidence of dissection. Electronically Signed   By: Macy Mis M.D.   On: 12/20/2019 12:19   CT ABDOMEN PELVIS W CONTRAST  Result Date: 12/20/2019 CLINICAL DATA:  Abdominal pain with vomiting and diarrhea EXAM: CT ABDOMEN AND PELVIS WITH CONTRAST TECHNIQUE: Multidetector CT imaging of the abdomen and pelvis was performed using the standard protocol following bolus administration of intravenous contrast. CONTRAST:  14mL OMNIPAQUE IOHEXOL 300 MG/ML  SOLN COMPARISON:  None. FINDINGS: LOWER CHEST: Cardiomegaly and bibasilar atelectasis. HEPATOBILIARY: Diffuse hypoattenuation of the liver relative to the spleen suggests hepatic steatosis. No focal liver lesion or biliary dilatation. Normal gallbladder. PANCREAS: Normal pancreas. No ductal dilatation or peripancreatic fluid collection. SPLEEN: Normal. ADRENALS/URINARY TRACT: The adrenal glands are normal. No hydronephrosis, nephroureterolithiasis or solid renal mass. The urinary bladder is normal for degree of distention STOMACH/BOWEL: There is no hiatal hernia. Normal duodenal course and caliber. No small bowel dilatation or inflammation. No focal colonic abnormality. Normal appendix. VASCULAR/LYMPHATIC: There is calcific atherosclerosis of the abdominal aorta. No abdominal or pelvic lymphadenopathy. REPRODUCTIVE: Normal prostate size with symmetric seminal vesicles. MUSCULOSKELETAL. No bony spinal canal stenosis or focal osseous abnormality. OTHER: None. IMPRESSION: 1. No acute abnormality of the abdomen or pelvis. 2. Hepatic steatosis. 3. Aortic Atherosclerosis (ICD10-I70.0). Electronically Signed   By: Ulyses Jarred M.D.   On: 12/20/2019 01:13   ECHOCARDIOGRAM COMPLETE  Result Date: 12/20/2019   ECHOCARDIOGRAM REPORT   Patient Name:   Eugene Cooper Date of Exam: 12/20/2019 Medical Rec #:  [id number]          Height:       64.0 in Accession #:    [id number]         Weight:       153.4 lb Date of Birth:  29-Mar-1941          BSA:           1.75 m Patient Age:    49 years           BP:           131/85 mmHg Patient Gender: M                  HR:  65 bpm. Exam Location:  [facility] Procedure: 2D Echo, Cardiac Doppler and Color Doppler Indications:    Stroke 434.91 / I163.9  History:        Patient has no prior history of Echocardiogram examinations.                 Risk Factors:Hypertension and Dyslipidemia.  Sonographer:    Alvino Chapel RCS Referring Phys: [phone] [place]  1. Left ventricular ejection fraction, by visual estimation, is 55 to 60%. The left ventricle has normal function. There is mildly increased left ventricular hypertrophy.  2. Left ventricular diastolic parameters are consistent with Grade I diastolic dysfunction (impaired relaxation).  3. The left ventricle has no regional wall motion abnormalities.  4. Global right ventricle has normal systolic function.The right ventricular size is normal. No increase in right ventricular wall thickness.  5. Left atrial size was normal.  6. Right atrial size was normal.  7. Presence of pericardial fat pad.  8. Mild mitral annular calcification.  9. The mitral valve is grossly normal. No evidence of mitral valve regurgitation. 10. The tricuspid valve is grossly normal. 11. The aortic valve is tricuspid. Aortic valve regurgitation is not visualized. 12. The pulmonic valve was grossly normal. Pulmonic valve regurgitation is not visualized. 13. Aortic dilatation noted. 14. 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 15. Mildly elevated pulmonary artery systolic pressure. 16. The tricuspid regurgitant velocity is 2.61 m/s, and with an assumed right atrial pressure of 3 mmHg, the estimated right ventricular systolic pressure is mildly elevated at 30.2 mmHg. 17. The inferior vena cava is normal in size with greater than 50% respiratory variability, suggesting right atrial pressure of 3 mmHg. FINDINGS  Left Ventricle: Left ventricular ejection fraction, by visual  estimation, is 55 to 60%. The left ventricle has normal function. The left ventricle has no regional wall motion abnormalities. The left ventricular internal cavity size was the left ventricle is normal in size. There is mildly increased left ventricular hypertrophy. Left ventricular diastolic parameters are consistent with Grade I diastolic dysfunction (impaired relaxation). Right Ventricle: The right ventricular size is normal. No increase in right ventricular wall thickness. Global RV systolic function is has normal systolic function. The tricuspid regurgitant velocity is 2.61 m/s, and with an assumed right atrial pressure  of 3 mmHg, the estimated right ventricular systolic pressure is mildly elevated at 30.2 mmHg. Left Atrium: Left atrial size was normal in size. Right Atrium: Right atrial size was normal in size Pericardium: There is no evidence of pericardial effusion. Presence of pericardial fat pad. Mitral Valve: The mitral valve is grossly normal. Mild mitral annular calcification. No evidence of mitral valve regurgitation. Tricuspid Valve: The tricuspid valve is grossly normal. Tricuspid valve regurgitation is mild. Aortic Valve: The aortic valve is tricuspid. Aortic valve regurgitation is not visualized. Mild aortic valve annular calcification. Pulmonic Valve: The pulmonic valve was grossly normal. Pulmonic valve regurgitation is not visualized. Pulmonic regurgitation is not visualized. Aorta: Aortic dilatation noted. 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 Venous: The inferior vena cava is normal in size with greater than 50% respiratory variability, suggesting right atrial pressure of 3 mmHg. IAS/Shunts: No atrial level shunt detected by color flow Doppler.  LEFT VENTRICLE PLAX 2D LVIDd:         4.09 cm       Diastology LVIDs:         2.70 cm       LV e' lateral:  5.22 cm/s LV PW:         1.13 cm       LV E/e' lateral: 10.5 LV IVS:        1.23 cm       LV e' medial:    4.68 cm/s LVOT diam:      1.90 cm       LV E/e' medial:  11.7 LV SV:         47 ml LV SV Index:   26.16 LVOT Area:     2.84 cm  LV Volumes (MOD) LV area d, A2C:    19.10 cm LV area d, A4C:    20.30 cm LV area s, A2C:    10.60 cm LV area s, A4C:    10.90 cm LV major d, A2C:   7.02 cm LV major d, A4C:   6.64 cm LV major s, A2C:   5.14 cm LV major s, A4C:   4.99 cm LV vol d, MOD A2C: 43.6 ml LV vol d, MOD A4C: 52.7 ml LV vol s, MOD A2C: 19.0 ml LV vol s, MOD A4C: 21.1 ml LV SV MOD A2C:     24.6 ml LV SV MOD A4C:     52.7 ml LV SV MOD BP:      28.8 ml RIGHT VENTRICLE RV S prime:     13.30 cm/s TAPSE (M-mode): 1.5 cm LEFT ATRIUM             Index LA diam:        3.10 cm 1.77 cm/m LA Vol (A2C):   30.6 ml 17.51 ml/m LA Vol (A4C):   35.6 ml 20.37 ml/m LA Biplane Vol: 36.4 ml 20.82 ml/m  AORTIC VALVE LVOT Vmax:   102.00 cm/s LVOT Vmean:  65.000 cm/s LVOT VTI:    0.187 m  AORTA Ao Root diam: 4.20 cm MITRAL VALVE                        TRICUSPID VALVE MV Area (PHT): 2.45 cm             TR Peak grad:   27.2 mmHg MV PHT:        89.90 msec           TR Vmax:        261.00 cm/s MV Decel Time: 310 msec MV E velocity: 54.80 cm/s 103 cm/s  SHUNTS MV A velocity: 81.40 cm/s 70.3 cm/s Systemic VTI:  0.19 m MV E/A ratio:  0.67       1.5       Systemic Diam: 1.90 cm  Rozann Lesches MD Electronically signed by Rozann Lesches MD Signature Date/Time: 12/20/2019/3:36:00 PM    Final      Microbiology: Recent Results (from the past 240 hour(s))  Respiratory Panel by RT PCR (Flu A&B, Covid) - Nasopharyngeal Swab     Status: None   Collection Time: 12/19/19  5:18 PM   Specimen: Nasopharyngeal Swab  Result Value Ref Range Status   SARS Coronavirus 2 by RT PCR NEGATIVE NEGATIVE Final    Comment: (NOTE) SARS-CoV-2 target nucleic acids are NOT DETECTED. The SARS-CoV-2 RNA is generally detectable in upper respiratoy specimens during the acute phase of infection. The lowest concentration of SARS-CoV-2 viral copies this assay can detect is 131  copies/mL. A negative result does not preclude SARS-Cov-2 infection and should not be used as the sole basis for treatment or other patient management decisions. A  negative result may occur with  improper specimen collection/handling, submission of specimen other than nasopharyngeal swab, presence of viral mutation(s) within the areas targeted by this assay, and inadequate number of viral copies (<131 copies/mL). A negative result must be combined with clinical observations, patient history, and epidemiological information. The expected result is Negative. Fact Sheet for Patients:  PinkCheek.be Fact Sheet for Healthcare Providers:  GravelBags.it This test is not yet ap proved or cleared by the [place] FDA and  has been authorized for detection and/or diagnosis of SARS-CoV-2 by FDA under an Emergency Use Authorization (EUA). This EUA will remain  in effect (meaning this test can be used) for the duration of the COVID-19 declaration under Section 564(b)(1) of the Act, 21 U.S.C. section 360bbb-3(b)(1), unless the authorization is terminated or revoked sooner.    Influenza A by PCR NEGATIVE NEGATIVE Final   Influenza B by PCR NEGATIVE NEGATIVE Final    Comment: (NOTE) The Xpert Xpress SARS-CoV-2/FLU/RSV assay is intended as an aid in  the diagnosis of influenza from Nasopharyngeal swab specimens and  should not be used as a sole basis for treatment. Nasal washings and  aspirates are unacceptable for Xpert Xpress SARS-CoV-2/FLU/RSV  testing. Fact Sheet for Patients: PinkCheek.be Fact Sheet for Healthcare Providers: GravelBags.it This test is not yet approved or cleared by the [place] FDA and  has been authorized for detection and/or diagnosis of SARS-CoV-2 by  FDA under an Emergency Use Authorization (EUA). This EUA will remain  in effect (meaning this test can  be used) for the duration of the  Covid-19 declaration under Section 564(b)(1) of the Act, 21  U.S.C. section 360bbb-3(b)(1), unless the authorization is  terminated or revoked. Performed at [facility], [address], [place], [place] [address]      Labs: Basic Metabolic Panel: Recent Labs  Lab 12/19/19 1542  NA 136  K 3.5  CL 97*  CO2 24  GLUCOSE 158*  BUN 26*  CREATININE 1.37*  CALCIUM 9.5   Liver Function Tests: Recent Labs  Lab 12/19/19 1542  AST 92*  ALT 69*  ALKPHOS 59  BILITOT 0.8  PROT 7.2  ALBUMIN 4.1   No results for input(s): LIPASE, AMYLASE in the last 168 hours. No results for input(s): AMMONIA in the last 168 hours. CBC: Recent Labs  Lab 12/19/19 1542  WBC 9.3  NEUTROABS 6.0  HGB 15.3  HCT 44.9  MCV 89.1  PLT 309   Cardiac Enzymes: No results for input(s): CKTOTAL, CKMB, CKMBINDEX, TROPONINI in the last 168 hours. BNP: Invalid input(s): POCBNP CBG: Recent Labs  Lab 12/21/19 0722 12/21/19 1136 12/21/19 1641 12/21/19 2049 12/22/19 0747  GLUCAP 161* 209* 126* 273* 168*    Time coordinating discharge:  36 minutes  Signed:  Orson Eva, DO [facility] Pager: [phone] 12/22/2019, 10:22 AM

## 2019-12-22 NOTE — Progress Notes (Signed)
Patient arrived on the unit via EMS on the stretcher, A&O 4, denied pain at the time, Assessment done, vitals stable. The video interpreter system was use for communication. Patient in bed with call light within reach, bed in lowest position. Patient education on safety plan

## 2019-12-22 NOTE — Progress Notes (Signed)
Inpatient Rehabilitation-Admissions Coordinator   I have received medical clearance from Dr. Carles Collet for admit to CIR today.  Spoke with pt and his son via phone with translator Spero Geralds present. Both are on board to pursue CIR today. RN and [facility] team aware of plan. Insurance benefits letter and consent forms reviewed with pt and his son. AC will begin setting up transfer with [facility] for admit to CIR today.   Please call if questions.   Raechel Ache, OTR/L  Rehab Admissions Coordinator  [phone] 12/22/2019 10:44 AM

## 2019-12-22 NOTE — Progress Notes (Signed)
Eugene Ribas, MD  Physician  Physical Medicine and Rehabilitation  PMR Pre-admission  Signed  Date of Service:  12/21/2019 12:37 PM      Related encounter: ED to Hosp-Admission (Discharged) from 12/19/2019 in [place]        PMR Admission Coordinator Pre-Admission Assessment   [name] is an 79 y.o., male MRN: [id number] DOB: [birth year] Height: 5' 4"  (162.6 cm) Weight: 69.6 kg   Insurance Information HMO:     PPO:      PCP:      IPA:      80/20:      OTHER:  PRIMARY: Medicaid [place]      Policy#: [id number]      Subscriber: Patient Coverage Code: MAACY CM Name:       Phone#:      Fax#:  Pre-Cert#:       Employer:  Benefits:  Phone #: [phone]     Name: verified via [employer] on 12/21/19 Eff. Date: verified eligibility on 12/21/19     Deduct:       Out of Pocket Max:       Life Max:  CIR: covered per Medicaid guidelines      SNF:  Outpatient:      Co-Pay:  Home Health:       Co-Pay:  DME:      Co-Pay:  Providers:  SECONDARY: Medicare part B only      Policy#: [id number]      Subscriber: Patient CM Name:       Phone#:      Fax#:  Pre-Cert#:       Employer:  Benefits:  Phone #: online     Name: verified eligibility online via [employer] on 12/21/19 Eff. Date: part B effective 04/08/2014     Deduct:       Out of Pocket Max:       Life Max:  CIR:       SNF:  Outpatient:      Co-Pay:  Home Health:       Co-Pay:  DME:      Co-Pay:    Medicaid Application Date:       Case Manager:  Disability Application Date:       Case Worker:    The "Data Collection Information Summary" for patients in Inpatient Rehabilitation Facilities with attached "Privacy Act [place] Records" was provided and verbally reviewed with: Patient and Family   Emergency Contact Information         Contact Information     Name Relation Home Work Mobile    Sanchez,[name] Daughter [phone]             Current Medical History    Patient Admitting Diagnosis: 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 He presented to [facility] ED on 1/10 after experiencing right arm weakness. He had reportedly been having body aches, dizziness, nausea, weakness, and trouble walking on 1/5 with right arm weakness persisting since then while his other symptoms resolved. He had an NIHSS of 3 and CT head was negative. Tele-neuro was consulted. MRI of the brain showed an acute infarct involving the left pontine area. tPA was not given as he was out of the therapeutic window. Cardiology was consulted as he was found to have a new LBBB on EKG.  Troponin negative and pt was without chest pain. Plan is for outpatient cardiology follow up once he has recovered from his stroke. Therapies have evaluated pt with recommendations for CIR. Pt is to admit to CIR on 12/22/2019.   Patient's medical record from [facility] has been reviewed by the rehabilitation admission coordinator and physician.   Past Medical History      Past Medical History:  Diagnosis Date  . Hyperlipemia    . Hypertension        Family History   family history is not on file.   Prior Rehab/Hospitalizations Has the patient had prior rehab or hospitalizations prior to admission? No   Has the patient had major surgery during 100 days prior to admission? No               Current Medications   Current Facility-Administered Medications:  .  acetaminophen (TYLENOL) tablet 650 mg, 650 mg, Oral, Q6H PRN, 650 mg at 12/22/19 1029 **OR** acetaminophen (TYLENOL) suppository 650 mg, 650 mg, Rectal, Q6H PRN, Emokpae, Ejiroghene E, MD .  aspirin EC tablet 162 mg, 162 mg, Oral, Daily, [place], Kofi, MD, 162 mg at 12/22/19 1030 .  atorvastatin (LIPITOR) tablet 40 mg, 40 mg, Oral, q1800, Emokpae, Ejiroghene E, MD, 40 mg at 12/21/19 1725 .  feeding supplement (GLUCERNA  SHAKE) (GLUCERNA SHAKE) liquid 237 mL, 237 mL, Oral, BID BM, Barton Dubois, MD, 237 mL at 12/22/19 1031 .  insulin aspart (novoLOG) injection 0-5 Units, 0-5 Units, Subcutaneous, QHS, Emokpae, Ejiroghene E, MD, 3 Units at 12/21/19 2238 .  insulin aspart (novoLOG) injection 0-9 Units, 0-9 Units, Subcutaneous, TID WC, Emokpae, Ejiroghene E, MD, 2 Units at 12/22/19 0825 .  labetalol (NORMODYNE) injection 10 mg, 10 mg, Intravenous, Q2H PRN, Emokpae, Ejiroghene E, MD .  multivitamin with minerals tablet 1 tablet, 1 tablet, Oral, Daily, Barton Dubois, MD, 1 tablet at 12/22/19 1029 .  ondansetron (ZOFRAN) tablet 4 mg, 4 mg, Oral, Q6H PRN **OR** ondansetron (ZOFRAN) injection 4 mg, 4 mg, Intravenous, Q6H PRN, Emokpae, Ejiroghene E, MD .  polyethylene glycol (MIRALAX / GLYCOLAX) packet 17 g, 17 g, Oral, Daily PRN, Emokpae, Ejiroghene E, MD .  sodium chloride flush (NS) 0.9 % injection 3 mL, 3 mL, Intravenous, PRN, Emokpae, Ejiroghene E, MD   Patients Current Diet:     Diet Order                      Diet heart healthy/carb modified Room service appropriate? Yes; Fluid consistency: Thin  Diet effective now                   Precautions / Restrictions Precautions Precautions: Fall Restrictions Weight Bearing Restrictions: No    Has the patient had 2 or more falls or a fall with injury in the past year? No   Prior Activity Level Community (5-7x/wk): active, no AD use. Retired and does not drive but gets out of the house whenever he wants and gets rides from family.    Prior Functional Level Self Care: Did the patient need help bathing, dressing, using the toilet or eating? Independent   Indoor Mobility: Did the patient need assistance with walking from room to room (with or without device)? Independent   Stairs: Did the patient need assistance with internal or external stairs (with or without device)? Independent   Functional Cognition: Did the patient need help planning regular tasks  such as shopping or remembering to take medications? Independent  Home Assistive Devices / Equipment Home Assistive Devices/Equipment: None Home Equipment: Cane - single point   Prior Device Use: Indicate devices/aids used by the patient prior to current illness, exacerbation or injury? None of the above   Current Functional Level Cognition   Overall Cognitive Status: Within Functional Limits for tasks assessed Orientation Level: Oriented to person, Oriented to place, Oriented to situation General Comments: Requires use of interpreter Attention: Focused Focused Attention: Appears intact Memory: Impaired Awareness: Appears intact Problem Solving: Impaired    Extremity Assessment (includes Sensation/Coordination)   Upper Extremity Assessment: RUE deficits/detail RUE Deficits / Details: shoulder strength 3-/5, elbow and wrist 3/5, decreased grip and pinch strength. Able to oppose to index finger only, poor thumb extension and abduction RUE Sensation: decreased light touch, decreased proprioception RUE Coordination: decreased fine motor, decreased gross motor  Lower Extremity Assessment: Defer to PT evaluation RLE Deficits / Details: grossly 3+/5 RLE Sensation: decreased proprioception, decreased light touch RLE Coordination: decreased fine motor, decreased gross motor LLE Deficits / Details: grossly 5/5 LLE Sensation: WNL LLE Coordination: WNL     ADLs   Overall ADL's : Needs assistance/impaired Eating/Feeding: Set up, Bed level Eating/Feeding Details (indicate cue type and reason): Assist for opening packages and cutting food Grooming: Oral care Grooming Details (indicate cue type and reason): Pt seated at EOB for teeth brushing, attempted to use RUE as assist to open toothpaste however was unable to complete. OT assisted with opening, pt then held toothbrush with right hand in lateral pinch to put paste on the brush. Used left hand to brush teeth.  Upper Body Dressing :  Moderate assistance, Sitting Upper Body Dressing Details (indicate cue type and reason): OT assisting with managing gown ties at back of neck Lower Body Dressing: Maximal assistance, Sitting/lateral leans Lower Body Dressing Details (indicate cue type and reason): pt unable to successfully use RUE to assist with dressing due to weakness and coordination deficits. Also with balance deficits when seated and attempting to bend forward to reach legs/feet General ADL Comments: Pt requiring increased assistance with ADLs due to right hemiplegia     Mobility   Overal bed mobility: Needs Assistance Bed Mobility: Supine to Sit Supine to sit: Min assist Sit to supine: Supervision General bed mobility comments: increased time, requires most assistance for scooting forward at EOB     Transfers   Overall transfer level: Needs assistance Equipment used: Rolling walker (2 wheeled), 1 person hand held assist Transfers: Sit to/from Stand, Stand Pivot Transfers Sit to Stand: Min assist, Mod assist Stand pivot transfers: Mod assist  Lateral/Scoot Transfers: Mod assist General transfer comment: Has difficulty balancing on RLE due to weakness, drags right foot when taking steps     Ambulation / Gait / Stairs / Wheelchair Mobility   Ambulation/Gait Ambulation/Gait assistance: Mod assist Gait Distance (Feet): 30 Feet Assistive device: Rolling walker (2 wheeled) Gait Pattern/deviations: Decreased step length - right, Decreased stance time - right, Decreased stride length, Decreased dorsiflexion - right General Gait Details: slow labored cadence with diffiuclty advancing RLE due to dragging of toe secondary to limited ankle dorsiflexion, frequently requires tactile cueing to help advance RLE, followed with wheelchair for safety Gait velocity: decreased     Posture / Balance Dynamic Sitting Balance Sitting balance - Comments: fair/good seated at EOB Balance Overall balance assessment: Needs  assistance Sitting-balance support: Feet supported, No upper extremity supported Sitting balance-Leahy Scale: Fair Sitting balance - Comments: fair/good seated at EOB Standing balance support: During functional activity, Bilateral upper  extremity supported Standing balance-Leahy Scale: Poor Standing balance comment: fair/poor using RW     Special needs/care consideration BiPAP/CPAP : no CPM : no Continuous Drip IV : no Dialysis : no        Days : no Life Vest : no Oxygen : no, on RA Special Bed : no Trach Size : no Wound Vac (area) : no      Location : no Skin: no areas of concern                  Bowel mgmt: last BM 12/20/19, incontinent Bladder mgmt: continent  Diabetic mgmt: yes Behavioral consideration : no Chemo/radiation : no    Previous Home Environment (from acute therapy documentation) Living Arrangements: Children  Lives With: Daughter Available Help at Discharge: Family, Available PRN/intermittently Type of Home: House Home Layout: One level Home Access: Stairs to enter Entrance Stairs-Rails: Right Entrance Stairs-Number of Steps: 6 Bathroom Accessibility: Yes Home Care Services: No   Discharge Living Setting Plans for Discharge Living Setting: Patient's home, Lives with (comment)(lives with his daugther Francisca) Type of Home at Discharge: House Discharge Home Layout: One level Discharge Home Access: Stairs to enter Entrance Stairs-Rails: None Entrance Stairs-Number of Steps: 1 Discharge Bathroom Shower/Tub: Walk-in shower Discharge Bathroom Toilet: Standard Discharge Bathroom Accessibility: Yes How Accessible: Accessible via walker Does the patient have any problems obtaining your medications?: No   Social/Family/Support Systems Patient Roles: Other (Comment)(lives with daugther, does odd jobs around the house.) Contact Information: Roselie Awkward (son): [phone] Anticipated Caregiver: *6 grown children to assist. Per Roselie Awkward, his wife will stay with him  (or the patient's neice Liliana) when his daugther is not home and at work (his daugther works 2pm-3am).  Anticipated Caregiver's Contact Information: see above Ability/Limitations of Caregiver: Min A Caregiver Availability: 24/7 Discharge Plan Discussed with Primary Caregiver: Yes(his son Roselie Awkward) Is Caregiver In Agreement with Plan?: Yes Does Caregiver/Family have Issues with Lodging/Transportation while Pt is in Rehab?: No   Goals/Additional Needs Patient/Family Goal for Rehab: PT/OT: Supervision; SLP: Mod I Expected length of stay: 7-10 days Cultural Considerations: Spanish speaking Dietary Needs: heart healthy/carb modified, thin liquids Equipment Needs: TBD Special Service Needs: Intrepter: Spanish Pt/Family Agrees to Admission and willing to participate: Yes Program Orientation Provided & Reviewed with Pt/Caregiver Including Roles  & Responsibilities: Yes(pt and his son Roselie Awkward )  Barriers to Discharge: Home environment access/layout  Barriers to Discharge Comments: one step to enter   Decrease burden of Care through IP rehab admission: NA   Possible need for SNF placement upon discharge: Not anticipated. Pt has good family support at DC and has a good prognosis for further progress through CIR.    Patient Condition: I have reviewed medical records from [facility], spoken with RN, and patient and son. I met with patient at the bedside for inpatient rehabilitation assessment.  Patient will benefit from ongoing PT, OT and SLP, can actively participate in 3 hours of therapy a day 5 days of the week, and can make measurable gains during the admission.  Patient will also benefit from the coordinated team approach during an Inpatient Acute Rehabilitation admission.  The patient will receive intensive therapy as well as Rehabilitation physician, nursing, social worker, and care management interventions.  Due to safety, skin/wound care, disease management, medication administration,  pain management and patient education the patient requires 24 hour a day rehabilitation nursing.  The patient is currently Mod A  with mobility and Mod/Max A for basic ADLs.  Discharge  setting and therapy post discharge at home with home health is anticipated.  Patient has agreed to participate in the Acute Inpatient Rehabilitation Program and will admit 12/22/19.   Preadmission Screen Completed By:  Raechel Ache, 12/22/2019 11:03 AM ______________________________________________________________________   Discussed status with Dr. Ranell Patrick on 12/22/19 at 11:02AM and received approval for admission today.   Admission Coordinator:  Raechel Ache, OT, time 11:02AM/Date 12/22/19    Assessment/Plan: Diagnosis: Left pons acute stroke 1. Does the need for close, 24 hr/day Medical supervision in concert with the patient's rehab needs make it unreasonable for this patient to be served in a less intensive setting? Yes 2. Co-Morbidities requiring supervision/potential complications: CKD3, nausea, New LBBB, HLD 3. Due to bladder management, bowel management, safety, skin/wound care, disease management, medication administration, pain management and patient education, does the patient require 24 hr/day rehab nursing? Yes and No 4. Does the patient require coordinated care of a physician, rehab nurse, PT, OT, and SLP to address physical and functional deficits in the context of the above medical diagnosis(es)? Yes Addressing deficits in the following areas: balance, endurance, locomotion, strength, transferring, bowel/bladder control, bathing, dressing, feeding, grooming, toileting, cognition, speech and language 5. Can the patient actively participate in an intensive therapy program of at least 3 hrs of therapy 5 days a week? Yes 6. The potential for patient to make measurable gains while on inpatient rehab is excellent 7. Anticipated functional outcomes upon discharge from inpatient rehab: modified independent PT,  modified independent OT, independent SLP 8. Estimated rehab length of stay to reach the above functional goals is: 10-14 days 9. Anticipated discharge destination: Home 10. Overall Rehab/Functional Prognosis: excellent     MD Signature: Leeroy Cha, MD        Revision History Date/Time User Provider Type Action  12/22/2019 11:45 AM Ranell Patrick, Clide Deutscher, MD Physician Sign  12/22/2019 11:03 AM Raechel Ache, OT Rehab Admission Coordinator Share   View Details Report

## 2019-12-22 NOTE — H&P (Signed)
Physical Medicine and Rehabilitation Admission H&P    Chief Complaint  Patient presents with  . Functional decline     Stroke with right side weakness    HPI: Eugene Cooper is a 79 year old non-English-speaking Hispanic male with history of HTN, T2DM, dyslipidemia who was admitted on 12/29/2019 right-sided weakness and gait abnormality.  Yesterday from chart review due to language and cognition.  Patient reported 2 to 3-day history of having nausea vomiting and diarrhea as well as diffuse abdominal pain with body aches.  Respiratory panel negative.  CT head negative for acute changes.  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  CT abdomen pelvis showed hepatic steatosis with no acute abnormality.  Hepatitis panel was done due to abnormal LFTs and was negative.  MRA head/neck was negative for stenosis or dissection.  Echocardiogram showed ejection fraction of 55 to 60% with mild left ventricular hypertrophy and no wall abnormality.  ASA recommended stroke for secondary stroke prevention. EKG showed left bundle branch block and Dr. Branch/cardiology recommended Carlton Adam on outpatient basis for work-up.  Therapy evaluation showed impaired cognition with delayed recall, balance deficits and difficulty with ADLs. CIR recommended due to functional decline.  Please see preadmission assessment from earlier today as well.   Review of Systems  Unable to perform ROS: Language      Past Medical History:  Diagnosis Date  . Hyperlipemia   . Hypertension     History reviewed. No pertinent surgical history.,  Unable to obtain from patient due to language   History reviewed. No pertinent family history.,  Unable to obtain from patient due to language  Social History:  reports that he has never smoked. He has never used smokeless tobacco. He reports previous alcohol use. He reports that he does not use drugs.    Allergies: No Known Allergies     Medications Prior to Admission  Medication Sig Dispense Refill  . lisinopril-hydrochlorothiazide (PRINZIDE,ZESTORETIC) 20-25 MG tablet Take 1 tablet by mouth daily.  0  . metFORMIN (GLUCOPHAGE) 1000 MG tablet Take 1 tablet (1,000 mg total) by mouth 2 (two) times daily with a meal. 60 tablet 0  . pravastatin (PRAVACHOL) 40 MG tablet Take 40 mg by mouth at bedtime.  0  . amoxicillin-clavulanate (AUGMENTIN) 875-125 MG tablet Take 1 tablet by mouth 2 (two) times daily.      Drug Regimen Review   Home: Home Living Family/patient expects to be discharged to:: Private residence Living Arrangements: Children Available Help at Discharge: Family, Available PRN/intermittently Type of Home: House Home Access: Stairs to enter Technical brewer of Steps: 6 Entrance Stairs-Rails: Right Home Layout: One level Bathroom Accessibility: Yes Home Equipment: [place] - single point  Lives With: Daughter   Functional History: Prior Function Level of Independence: Independent Comments: independent with ADLs and mobility, does not drive  Functional Status:  Mobility: Bed Mobility Overal bed mobility: Needs Assistance Bed Mobility: Supine to Sit Supine to sit: Min assist Sit to supine: Supervision General bed mobility comments: increased time, requires most assistance for scooting forward at EOB Transfers Overall transfer level: Needs assistance Equipment used: Rolling walker (2 wheeled), 1 person hand held assist Transfers: Sit to/from Stand, Stand Pivot Transfers Sit to Stand: Min assist, Mod assist Stand pivot transfers: Mod assist  Lateral/Scoot Transfers: Mod assist General transfer comment: Has difficulty balancing on RLE due to weakness, drags right foot when taking steps Ambulation/Gait Ambulation/Gait assistance: Mod assist Gait Distance (Feet): 30 Feet Assistive device: Rolling  walker (2 wheeled) Gait Pattern/deviations: Decreased step length - right, Decreased stance time -  right, Decreased stride length, Decreased dorsiflexion - right General Gait Details: slow labored cadence with diffiuclty advancing RLE due to dragging of toe secondary to limited ankle dorsiflexion, frequently requires tactile cueing to help advance RLE, followed with wheelchair for safety Gait velocity: decreased    ADL: ADL Overall ADL's : Needs assistance/impaired Eating/Feeding: Set up, Bed level Eating/Feeding Details (indicate cue type and reason): Assist for opening packages and cutting food Grooming: Oral care Grooming Details (indicate cue type and reason): Pt seated at EOB for teeth brushing, attempted to use RUE as assist to open toothpaste however was unable to complete. OT assisted with opening, pt then held toothbrush with right hand in lateral pinch to put paste on the brush. Used left hand to brush teeth.  Upper Body Dressing : Moderate assistance, Sitting Upper Body Dressing Details (indicate cue type and reason): OT assisting with managing gown ties at back of neck Lower Body Dressing: Maximal assistance, Sitting/lateral leans Lower Body Dressing Details (indicate cue type and reason): pt unable to successfully use RUE to assist with dressing due to weakness and coordination deficits. Also with balance deficits when seated and attempting to bend forward to reach legs/feet General ADL Comments: Pt requiring increased assistance with ADLs due to right hemiplegia  Cognition: Cognition Overall Cognitive Status: Within Functional Limits for tasks assessed Orientation Level: Oriented to person, Oriented to place, Oriented to situation Attention: Focused Focused Attention: Appears intact Memory: Impaired Awareness: Appears intact Problem Solving: Impaired Cognition Arousal/Alertness: Awake/alert Behavior During Therapy: WFL for tasks assessed/performed Overall Cognitive Status: Within Functional Limits for tasks assessed General Comments: Requires use of  interpreter  Physical Exam: Blood pressure 110/79, pulse 95, temperature 98.2 F (36.8 C), temperature source Oral, resp. rate 18, height 5\' 4"  (1.626 m), weight 69.6 kg, SpO2 96 %. Physical Exam  Vitals reviewed. Constitutional: He appears well-developed and well-nourished.  HENT:  Head: Normocephalic and atraumatic.  Eyes: Right eye exhibits no discharge. Left eye exhibits no discharge. No scleral icterus.  Dysconjugate gaze  Neck: No tracheal deviation present. No thyromegaly present.  Respiratory: Effort normal. No respiratory distress.  GI: Soft. He exhibits no distension.  Musculoskeletal:     Comments: No edema or tenderness in extremities  Neurological: He is alert.  Motor: Limited due to language, but appears to be 5/5 RUE/RLE, LUE/LLE: ?2-/5 proximal to distal  Skin: Skin is warm and dry.  Psychiatric:  Unable to assess due to language    Results for orders placed or performed during the hospital encounter of 12/19/19 (from the past 48 hour(s))  Glucose, capillary     Status: Abnormal   Collection Time: 12/20/19 11:42 AM  Result Value Ref Range   Glucose-Capillary 219 (H) 70 - 99 mg/dL  Glucose, capillary     Status: Abnormal   Collection Time: 12/20/19  4:18 PM  Result Value Ref Range   Glucose-Capillary 147 (H) 70 - 99 mg/dL  Glucose, capillary     Status: Abnormal   Collection Time: 12/20/19  9:13 PM  Result Value Ref Range   Glucose-Capillary 165 (H) 70 - 99 mg/dL   Comment 1 Notify RN    Comment 2 Document in Chart   Glucose, capillary     Status: Abnormal   Collection Time: 12/21/19  7:22 AM  Result Value Ref Range   Glucose-Capillary 161 (H) 70 - 99 mg/dL  Glucose, capillary     Status:  Abnormal   Collection Time: 12/21/19 11:36 AM  Result Value Ref Range   Glucose-Capillary 209 (H) 70 - 99 mg/dL  Glucose, capillary     Status: Abnormal   Collection Time: 12/21/19  4:41 PM  Result Value Ref Range   Glucose-Capillary 126 (H) 70 - 99 mg/dL  Glucose,  capillary     Status: Abnormal   Collection Time: 12/21/19  8:49 PM  Result Value Ref Range   Glucose-Capillary 273 (H) 70 - 99 mg/dL  Glucose, capillary     Status: Abnormal   Collection Time: 12/22/19  7:47 AM  Result Value Ref Range   Glucose-Capillary 168 (H) 70 - 99 mg/dL   MR ANGIO HEAD WO CONTRAST  Result Date: 12/20/2019 CLINICAL DATA:  Right-sided weakness, facial asymmetry EXAM: MRI HEAD WITHOUT CONTRAST MRA HEAD WITHOUT CONTRAST MRA NECK WITHOUT AND WITH CONTRAST TECHNIQUE: Multiplanar, multiecho pulse sequences of the brain and surrounding structures were obtained without intravenous contrast. Angiographic images of the Circle of Willis were obtained using MRA technique without intravenous contrast. Angiographic images of the neck were obtained using MRA technique without and with intravenous contrast. Carotid stenosis measurements (when applicable) are obtained utilizing NASCET criteria, using the distal internal carotid diameter as the denominator. CONTRAST:  39mL GADAVIST GADOBUTROL 1 MMOL/ML IV SOLN COMPARISON:  None. FINDINGS: MRI HEAD FINDINGS Brain: There is reduced diffusion in the parasagittal left pons. No evidence of hemorrhage. Patchy T2 hyperintensity in the supratentorial white matter is nonspecific but may reflect mild chronic microvascular ischemic changes. Ventricles and sulci are within normal limits in size and configuration. There is no intracranial mass, mass effect, hydrocephalus, or extra-axial fluid collection. Vascular: Major vessel flow voids at the skull base are preserved. Skull and upper cervical spine: Marrow signal is unremarkable. Sinuses/Orbits: No acute abnormality. Other: None. MRA HEAD FINDINGS Intracranial internal carotid arteries are patent. Middle and anterior cerebral arteries are patent. Intracranial vertebral arteries, basilar artery, posterior cerebral arteries are patent. A left posterior communicating artery is present. There is no significant  stenosis or aneurysm. MRA NECK FINDINGS Common, internal, and external carotid arteries are patent. Extracranial vertebral arteries are patent. No hemodynamically significant stenosis or evidence of dissection. 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 No hemodynamically significant stenosis or evidence of dissection. Electronically Signed   By: Macy Mis M.D.   On: 12/20/2019 12:19   MR ANGIO NECK W WO CONTRAST  Result Date: 12/20/2019 CLINICAL DATA:  Right-sided weakness, facial asymmetry EXAM: MRI HEAD WITHOUT CONTRAST MRA HEAD WITHOUT CONTRAST MRA NECK WITHOUT AND WITH CONTRAST TECHNIQUE: Multiplanar, multiecho pulse sequences of the brain and surrounding structures were obtained without intravenous contrast. Angiographic images of the Circle of Willis were obtained using MRA technique without intravenous contrast. Angiographic images of the neck were obtained using MRA technique without and with intravenous contrast. Carotid stenosis measurements (when applicable) are obtained utilizing NASCET criteria, using the distal internal carotid diameter as the denominator. CONTRAST:  74mL GADAVIST GADOBUTROL 1 MMOL/ML IV SOLN COMPARISON:  None. FINDINGS: MRI HEAD FINDINGS Brain: There is reduced diffusion in the parasagittal left pons. No evidence of hemorrhage. Patchy T2 hyperintensity in the supratentorial white matter is nonspecific but may reflect mild chronic microvascular ischemic changes. Ventricles and sulci are within normal limits in size and configuration. There is no intracranial mass, mass effect, hydrocephalus, or extra-axial fluid collection. Vascular: Major vessel flow voids at the skull base are preserved. Skull and upper cervical spine: Marrow signal is  unremarkable. Sinuses/Orbits: No acute abnormality. Other: None. MRA HEAD FINDINGS Intracranial internal carotid arteries are patent. Middle and anterior cerebral arteries are  patent. Intracranial vertebral arteries, basilar artery, posterior cerebral arteries are patent. A left posterior communicating artery is present. There is no significant stenosis or aneurysm. MRA NECK FINDINGS Common, internal, and external carotid arteries are patent. Extracranial vertebral arteries are patent. No hemodynamically significant stenosis or evidence of dissection. 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 No hemodynamically significant stenosis or evidence of dissection. Electronically Signed   By: Macy Mis M.D.   On: 12/20/2019 12:19   MR BRAIN WO CONTRAST  Result Date: 12/20/2019 CLINICAL DATA:  Right-sided weakness, facial asymmetry EXAM: MRI HEAD WITHOUT CONTRAST MRA HEAD WITHOUT CONTRAST MRA NECK WITHOUT AND WITH CONTRAST TECHNIQUE: Multiplanar, multiecho pulse sequences of the brain and surrounding structures were obtained without intravenous contrast. Angiographic images of the Circle of Willis were obtained using MRA technique without intravenous contrast. Angiographic images of the neck were obtained using MRA technique without and with intravenous contrast. Carotid stenosis measurements (when applicable) are obtained utilizing NASCET criteria, using the distal internal carotid diameter as the denominator. CONTRAST:  10mL GADAVIST GADOBUTROL 1 MMOL/ML IV SOLN COMPARISON:  None. FINDINGS: MRI HEAD FINDINGS Brain: There is reduced diffusion in the parasagittal left pons. No evidence of hemorrhage. Patchy T2 hyperintensity in the supratentorial white matter is nonspecific but may reflect mild chronic microvascular ischemic changes. Ventricles and sulci are within normal limits in size and configuration. There is no intracranial mass, mass effect, hydrocephalus, or extra-axial fluid collection. Vascular: Major vessel flow voids at the skull base are preserved. Skull and upper cervical spine: Marrow signal is unremarkable.  Sinuses/Orbits: No acute abnormality. Other: None. MRA HEAD FINDINGS Intracranial internal carotid arteries are patent. Middle and anterior cerebral arteries are patent. Intracranial vertebral arteries, basilar artery, posterior cerebral arteries are patent. A left posterior communicating artery is present. There is no significant stenosis or aneurysm. MRA NECK FINDINGS Common, internal, and external carotid arteries are patent. Extracranial vertebral arteries are patent. No hemodynamically significant stenosis or evidence of dissection. 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 No hemodynamically significant stenosis or evidence of dissection. Electronically Signed   By: Macy Mis M.D.   On: 12/20/2019 12:19   ECHOCARDIOGRAM COMPLETE  Result Date: 12/20/2019   ECHOCARDIOGRAM REPORT   Patient Name:   Eugene Cooper Date of Exam: 12/20/2019 Medical Rec #:  [id number]          Height:       64.0 in Accession #:    [id number]         Weight:       153.4 lb Date of Birth:  1941/05/23          BSA:          1.75 m Patient Age:    55 years           BP:           131/85 mmHg Patient Gender: M                  HR:           65 bpm. Exam Location:  [facility] Procedure: 2D Echo, Cardiac Doppler and Color Doppler Indications:    Stroke 434.91 / I163.9  History:        Patient has no prior history of Echocardiogram examinations.  Risk Factors:Hypertension and Dyslipidemia.  Sonographer:    Alvino Chapel RCS Referring Phys: [phone] [place]  1. Left ventricular ejection fraction, by visual estimation, is 55 to 60%. The left ventricle has normal function. There is mildly increased left ventricular hypertrophy.  2. Left ventricular diastolic parameters are consistent with Grade I diastolic dysfunction (impaired relaxation).  3. The left ventricle has no regional wall motion abnormalities.  4. Global right ventricle has  normal systolic function.The right ventricular size is normal. No increase in right ventricular wall thickness.  5. Left atrial size was normal.  6. Right atrial size was normal.  7. Presence of pericardial fat pad.  8. Mild mitral annular calcification.  9. The mitral valve is grossly normal. No evidence of mitral valve regurgitation. 10. The tricuspid valve is grossly normal. 11. The aortic valve is tricuspid. Aortic valve regurgitation is not visualized. 12. The pulmonic valve was grossly normal. Pulmonic valve regurgitation is not visualized. 13. Aortic dilatation noted. 14. 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 15. Mildly elevated pulmonary artery systolic pressure. 16. The tricuspid regurgitant velocity is 2.61 m/s, and with an assumed right atrial pressure of 3 mmHg, the estimated right ventricular systolic pressure is mildly elevated at 30.2 mmHg. 17. The inferior vena cava is normal in size with greater than 50% respiratory variability, suggesting right atrial pressure of 3 mmHg. FINDINGS  Left Ventricle: Left ventricular ejection fraction, by visual estimation, is 55 to 60%. The left ventricle has normal function. The left ventricle has no regional wall motion abnormalities. The left ventricular internal cavity size was the left ventricle is normal in size. There is mildly increased left ventricular hypertrophy. Left ventricular diastolic parameters are consistent with Grade I diastolic dysfunction (impaired relaxation). Right Ventricle: The right ventricular size is normal. No increase in right ventricular wall thickness. Global RV systolic function is has normal systolic function. The tricuspid regurgitant velocity is 2.61 m/s, and with an assumed right atrial pressure  of 3 mmHg, the estimated right ventricular systolic pressure is mildly elevated at 30.2 mmHg. Left Atrium: Left atrial size was normal in size. Right Atrium: Right atrial size was normal in size Pericardium: There is no evidence of  pericardial effusion. Presence of pericardial fat pad. Mitral Valve: The mitral valve is grossly normal. Mild mitral annular calcification. No evidence of mitral valve regurgitation. Tricuspid Valve: The tricuspid valve is grossly normal. Tricuspid valve regurgitation is mild. Aortic Valve: The aortic valve is tricuspid. Aortic valve regurgitation is not visualized. Mild aortic valve annular calcification. Pulmonic Valve: The pulmonic valve was grossly normal. Pulmonic valve regurgitation is not visualized. Pulmonic regurgitation is not visualized. Aorta: Aortic dilatation noted. 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 Venous: The inferior vena cava is normal in size with greater than 50% respiratory variability, suggesting right atrial pressure of 3 mmHg. IAS/Shunts: No atrial level shunt detected by color flow Doppler.  LEFT VENTRICLE PLAX 2D LVIDd:         4.09 cm       Diastology LVIDs:         2.70 cm       LV e' lateral:   5.22 cm/s LV PW:         1.13 cm       LV E/e' lateral: 10.5 LV IVS:        1.23 cm       LV e' medial:    4.68 cm/s LVOT diam:  1.90 cm       LV E/e' medial:  11.7 LV SV:         47 ml LV SV Index:   26.16 LVOT Area:     2.84 cm  LV Volumes (MOD) LV area d, A2C:    19.10 cm LV area d, A4C:    20.30 cm LV area s, A2C:    10.60 cm LV area s, A4C:    10.90 cm LV major d, A2C:   7.02 cm LV major d, A4C:   6.64 cm LV major s, A2C:   5.14 cm LV major s, A4C:   4.99 cm LV vol d, MOD A2C: 43.6 ml LV vol d, MOD A4C: 52.7 ml LV vol s, MOD A2C: 19.0 ml LV vol s, MOD A4C: 21.1 ml LV SV MOD A2C:     24.6 ml LV SV MOD A4C:     52.7 ml LV SV MOD BP:      28.8 ml RIGHT VENTRICLE RV S prime:     13.30 cm/s TAPSE (M-mode): 1.5 cm LEFT ATRIUM             Index LA diam:        3.10 cm 1.77 cm/m LA Vol (A2C):   30.6 ml 17.51 ml/m LA Vol (A4C):   35.6 ml 20.37 ml/m LA Biplane Vol: 36.4 ml 20.82 ml/m  AORTIC VALVE LVOT Vmax:   102.00 cm/s LVOT Vmean:  65.000 cm/s LVOT VTI:    0.187 m  AORTA Ao  Root diam: 4.20 cm MITRAL VALVE                        TRICUSPID VALVE MV Area (PHT): 2.45 cm             TR Peak grad:   27.2 mmHg MV PHT:        89.90 msec           TR Vmax:        261.00 cm/s MV Decel Time: 310 msec MV E velocity: 54.80 cm/s 103 cm/s  SHUNTS MV A velocity: 81.40 cm/s 70.3 cm/s Systemic VTI:  0.19 m MV E/A ratio:  0.67       1.5       Systemic Diam: 1.90 cm  Rozann Lesches MD Electronically signed by Rozann Lesches MD Signature Date/Time: 12/20/2019/3:36:00 PM    Final        Medical Problem List and Plan: 1.  Impaired cognition with delayed recall, balance deficits, left hemiparesis, difficulty with ADLs secondary to acute left parasagittal pons infarct.  -patient may may shower  -ELOS/Goals: 14-18 days/Supervision/min A.  Admit to CIR 2.  Antithrombotics: -DVT/anticoagulation:  Pharmaceutical: Lovenox  -antiplatelet therapy:  ASA daily.  3. Pain Management: Tylenol as needed 4. Mood: LCSW to follow for evaluation and support  -antipsychotic agents: N/A 5. Neuropsych: This patient is?  Fully capable of making decisions on his own behalf. 6. Skin/Wound Care: Routine pressure relief measures.  7. Fluids/Electrolytes/Nutrition: Monitor I/O.  CMP ordered for tomorrow a.m. 8. HTN: Monitor blood pressures 3 times daily.  Permissive hypertension and resume Prinivil/hctz as indicated.   Monitor with increased mobility 9.T2DM with hyperglycemia: Metformin on hold due to elevation in creatinine--monitor blood sugars ac/hs and titrate as indicated.  Monitor with increased mobility 10.Dyslipidemia: Pravastatin changed to atorvastatin for aggressive management. 11. LBBB: Follow up with cardiology for [facility] after discharge.   Bary Leriche, PA-C 12/22/2019  I have personally performed a face to face diagnostic evaluation, including, but not limited to relevant history and physical exam findings, of this patient and developed relevant assessment and plan.  Additionally, I have  reviewed and concur with the physician assistant's documentation above.  Delice Lesch, MD, ABPMR

## 2019-12-23 ENCOUNTER — Inpatient Hospital Stay (HOSPITAL_COMMUNITY): Payer: Medicare Other | Admitting: Speech Pathology

## 2019-12-23 ENCOUNTER — Inpatient Hospital Stay (HOSPITAL_COMMUNITY): Payer: Medicare Other

## 2019-12-23 ENCOUNTER — Inpatient Hospital Stay (HOSPITAL_COMMUNITY): Payer: Medicare Other | Admitting: Physical Therapy

## 2019-12-23 ENCOUNTER — Inpatient Hospital Stay (HOSPITAL_COMMUNITY): Payer: Medicare Other | Admitting: Occupational Therapy

## 2019-12-23 DIAGNOSIS — I69351 Hemiplegia and hemiparesis following cerebral infarction affecting right dominant side: Secondary | ICD-10-CM | POA: Diagnosis not present

## 2019-12-23 DIAGNOSIS — I6350 Cerebral infarction due to unspecified occlusion or stenosis of unspecified cerebral artery: Secondary | ICD-10-CM | POA: Diagnosis not present

## 2019-12-23 LAB — COMPREHENSIVE METABOLIC PANEL
ALT: 55 U/L — ABNORMAL HIGH (ref 0–44)
AST: 49 U/L — ABNORMAL HIGH (ref 15–41)
Albumin: 3.5 g/dL (ref 3.5–5.0)
Alkaline Phosphatase: 57 U/L (ref 38–126)
Anion gap: 13 (ref 5–15)
BUN: 25 mg/dL — ABNORMAL HIGH (ref 8–23)
CO2: 25 mmol/L (ref 22–32)
Calcium: 9.2 mg/dL (ref 8.9–10.3)
Chloride: 99 mmol/L (ref 98–111)
Creatinine, Ser: 1.08 mg/dL (ref 0.61–1.24)
GFR calc Af Amer: >60 mL/min (ref >60)
GFR calc non Af Amer: >60 mL/min (ref >60)
Glucose, Bld: 212 mg/dL — ABNORMAL HIGH (ref 70–99)
Potassium: 3.6 mmol/L (ref 3.5–5.1)
Sodium: 137 mmol/L (ref 135–145)
Total Bilirubin: 1.3 mg/dL — ABNORMAL HIGH (ref 0.3–1.2)
Total Protein: 6.4 g/dL — ABNORMAL LOW (ref 6.5–8.1)

## 2019-12-23 LAB — CBC WITH DIFFERENTIAL/PLATELET
Abs Immature Granulocytes: 0.05 10*3/uL (ref 0.00–0.07)
Basophils Absolute: 0.1 10*3/uL (ref 0.0–0.1)
Basophils Relative: 1 %
Eosinophils Absolute: 0.5 10*3/uL (ref 0.0–0.5)
Eosinophils Relative: 5 %
HCT: 41.8 % (ref 39.0–52.0)
Hemoglobin: 14.5 g/dL (ref 13.0–17.0)
Immature Granulocytes: 1 %
Lymphocytes Relative: 25 %
Lymphs Abs: 2.3 10*3/uL (ref 0.7–4.0)
MCH: 30.2 pg (ref 26.0–34.0)
MCHC: 34.7 g/dL (ref 30.0–36.0)
MCV: 87.1 fL (ref 80.0–100.0)
Monocytes Absolute: 0.8 10*3/uL (ref 0.1–1.0)
Monocytes Relative: 9 %
Neutro Abs: 5.5 10*3/uL (ref 1.7–7.7)
Neutrophils Relative %: 59 %
Platelets: 307 10*3/uL (ref 150–400)
RBC: 4.8 MIL/uL (ref 4.22–5.81)
RDW: 12.8 % (ref 11.5–15.5)
WBC: 9.2 10*3/uL (ref 4.0–10.5)
nRBC: 0 % (ref 0.0–0.2)

## 2019-12-23 LAB — GLUCOSE, CAPILLARY
Glucose-Capillary: 195 mg/dL — ABNORMAL HIGH (ref 70–99)
Glucose-Capillary: 184 mg/dL — ABNORMAL HIGH (ref 70–99)
Glucose-Capillary: 159 mg/dL — ABNORMAL HIGH (ref 70–99)
Glucose-Capillary: 164 mg/dL — ABNORMAL HIGH (ref 70–99)

## 2019-12-23 NOTE — Care Management (Signed)
Inpatient [facility] Individual Statement of Services  Patient Name:  Eugene Cooper  Date:  12/23/2019  Welcome to the [facility].  Our goal is to provide you with an individualized program based on your diagnosis and situation, designed to meet your specific needs.  With this comprehensive rehabilitation program, you will be expected to participate in at least 3 hours of rehabilitation therapies Monday-Friday, with modified therapy programming on the weekends.  Your rehabilitation program will include the following services:  Physical Therapy (PT), Occupational Therapy (OT), Speech Therapy (ST), 24 hour per day rehabilitation nursing, Neuropsychology, Case Management (Social Worker), Rehabilitation Medicine, Nutrition Services and Pharmacy Services  Weekly team conferences will be held on Wednesdays to discuss your progress.  Your Social Worker will talk with you frequently to get your input and to update you on team discussions.  Team conferences with you and your family in attendance may also be held.  Expected length of stay:  Overall anticipated outcome: Supervision - Minimal assistance  Depending on your progress and recovery, your program may change. Your Social Worker will coordinate services and will keep you informed of any changes. Your Social Worker's name and contact numbers are listed  below.  The following services may also be recommended but are not provided by the [facility]:    [place] will be made to provide these services after discharge if needed.  Arrangements include referral to agencies that provide these services.  Your insurance has been verified to [id number] Medicare Part B Your primary doctor is:  [facility]; [place]. [place],  Cardiology:Dr. Carlyle Dolly  Pertinent information will be  shared with your doctor and your insurance company.  Social Worker:  [place], [place] or ([phone]   Information discussed with and copy given to patient by: Margarito Liner, 12/23/2019, 11:33 AM

## 2019-12-23 NOTE — Evaluation (Signed)
Occupational Therapy Assessment and Plan  Patient Details  Name: Eugene Cooper MRN: [id number] Date of Birth: [birth year]  OT Diagnosis: abnormal posture, ataxia, hemiplegia affecting dominant side, muscle weakness (generalized) and coordination disorder Rehab Potential: Rehab Potential (ACUTE ONLY): Fair ELOS: 14- 18 days   Today's Date: 12/23/2019 OT Individual Time: 0900-1000 OT Individual Time Calculation (min): 60 min     Problem List:  Patient Active Problem List   Diagnosis Date Noted  . Acute ischemic stroke ([place]) 12/22/2019  . Uncontrolled type 2 diabetes mellitus with hyperglycemia, without long-term current use of insulin ([place]) 12/22/2019  . Mixed hyperlipidemia 12/22/2019  . 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 Essential hypertension   . Left bundle branch block   . Stroke ([place]) 12/20/2019  . Right sided weakness 12/19/2019    Past Medical History:  Past Medical History:  Diagnosis Date  . Hyperlipemia   . Hypertension    Past Surgical History: History reviewed. No pertinent surgical history.  Assessment & Plan Clinical Impression: Patient is a 79 y.o. year old male with history of HTN, T2DM, dyslipidemia who was admitted on 12/29/2019 right-sided weakness and gait abnormality.  Yesterday from chart review due to language and cognition.  Patient reported 2 to 3-day history of having nausea vomiting and diarrhea as well as diffuse abdominal pain with body aches.  Respiratory panel negative.  CT head negative for acute changes.  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  CT abdomen pelvis showed hepatic steatosis with no acute abnormality.  Hepatitis panel was done due to abnormal LFTs and was negative.  MRA head/neck was negative for stenosis or dissection.  Echocardiogram showed ejection fraction of 55 to 60% with mild left ventricular hypertrophy and no wall abnormality.  ASA recommended stroke for secondary  stroke prevention. EKG showed left bundle branch block and Dr. Branch/cardiology recommended Carlton Adam on outpatient basis for work-up.  Therapy evaluation showed impaired cognition with delayed recall, balance deficits and difficulty with ADLs. .  Patient transferred to CIR on 12/22/2019 .    Patient currently requires max with basic self-care skills secondary to muscle weakness, decreased cardiorespiratoy endurance, ataxia, decreased coordination and decreased motor planning, decreased initiation, decreased attention, decreased awareness, decreased problem solving, decreased safety awareness, decreased memory and delayed processing and decreased sitting balance, decreased standing balance, decreased postural control, hemiplegia and decreased balance strategies.  Prior to hospitalization, patient could complete ADLs and IADLs with independent .  Patient will benefit from skilled intervention to decrease level of assist with basic self-care skills prior to discharge home with care partner.  Anticipate patient will require 24 hour supervision and follow up home health.  OT - End of Session Activity Tolerance: Decreased this session Endurance Deficit: Yes Endurance Deficit Description: mutliple rest breaks with self care tasks and functional transfers OT Assessment Rehab Potential (ACUTE ONLY): Fair OT Barriers to Discharge: Other (comments) OT Barriers to Discharge Comments: none known at this time OT Patient demonstrates impairments in the following area(s): Balance;Endurance;Pain;Safety OT Basic ADL's Functional Problem(s): Grooming;Bathing;Dressing;Toileting OT Transfers Functional Problem(s): Toilet;Tub/Shower OT Additional Impairment(s): Fuctional Use of Upper Extremity OT Plan OT Intensity: Minimum of 1-2 x/day, 45 to 90 minutes OT Frequency: 5 out of 7 days OT Duration/Estimated Length of Stay: 14- 18 days OT Treatment/Interventions: Balance/vestibular training;DME/adaptive equipment  instruction;Patient/family education;Therapeutic Activities;Wheelchair propulsion/positioning;Therapeutic Exercise;Psychosocial support;Functional electrical stimulation;Cognitive remediation/compensation;Community reintegration;Functional mobility training;Self Care/advanced ADL retraining;UE/LE Strength taining/ROM;Discharge planning;Neuromuscular re-education;UE/LE Coordination activities;Pain management OT Self Feeding Anticipated Outcome(s): supervision OT Basic  Self-Care Anticipated Outcome(s): supervision OT Toileting Anticipated Outcome(s): supervision OT Bathroom Transfers Anticipated Outcome(s): supervision OT Recommendation Recommendations for Other Services: Therapeutic Recreation consult Therapeutic Recreation Interventions: Outing/community reintergration Patient destination: Home Follow Up Recommendations: Home health OT;24 hour supervision/assistance Equipment Recommended: To be determined   Skilled Therapeutic Intervention Upon entering the room, pt supine in bed with interpreter present. No c/o pain this session and agreeable to OT intervention. Pt performing bed mobility with mod A to EOB. Pt transferred from bed >wheelchair with mod A stand pivot transfer. Pt bathing at this time but agreeable to dressing tasks at sink. Pt needing mod - max A for standing balance for LB clothing management. Pt following commands for hemiplegic technique 50% of the time. Pt performing UB dressing tasks with mod A and hand over hand assistance to utilize R UE into functional task. Pt requires increased encouragement and education to use R UE . Pt transferred from wheelchair >recliner chair with mod A stand pivot transfer. Chair alarm belt activated and call bell within reach upon exiting the room. OT educated pt on OT purpose, POC, and goals with pt verbalizing understanding and agreement.   OT Evaluation Precautions/Restrictions  Precautions Precautions: Fall Restrictions Weight Bearing  Restrictions: No Pain Pain Assessment Pain Scale: 0-10 Pain Score: 0-No pain Home Living/Prior Functioning Home Living Family/patient expects to be discharged to:: Private residence Living Arrangements: Children Available Help at Discharge: Family, Available PRN/intermittently Type of Home: House Home Access: Stairs to enter Technical brewer of Steps: 6 Entrance Stairs-Rails: Right Home Layout: One level Bathroom Accessibility: Yes  Lives With: Son IADL History Education: none Prior Function Level of Independence: Independent with basic ADLs, Independent with gait, Needs assistance with homemaking, Independent with transfers Vocation: Retired Comments: independent with ADLs and mobility, does not drive Vision Baseline Vision/History: No visual deficits Patient Visual Report: No change from baseline Vision Assessment?: No apparent visual deficits Cognition Overall Cognitive Status: Within Functional Limits for tasks assessed Arousal/Alertness: Awake/alert Orientation Level: Person;Place;Situation Person: Oriented Place: Oriented Situation: Oriented Year: 2021 Month: January Day of Week: Incorrect Memory: Impaired Memory Impairment: Decreased recall of new information;Retrieval deficit Immediate Memory Recall: Sock;Blue;Bed Memory Recall Sock: Without Cue Memory Recall Blue: Not able to recall Memory Recall Bed: Not able to recall Safety/Judgment: Impaired Sensation Sensation Light Touch: Impaired by gross assessment Coordination Gross Motor Movements are Fluid and Coordinated: No Fine Motor Movements are Fluid and Coordinated: No Coordination and Movement Description: R hemiplegia Motor  Motor Motor: Hemiplegia Mobility  Bed Mobility Bed Mobility: Rolling Right;Rolling Left;Supine to Sit;Sit to Supine Rolling Right: Minimal Assistance - Patient > 75% Rolling Left: Minimal Assistance - Patient > 75% Supine to Sit: Moderate Assistance - Patient  50-74% Sit to Supine: Moderate Assistance - Patient 50-74% Transfers Sit to Stand: Moderate Assistance - Patient 50-74%  Trunk/Postural Assessment  Cervical Assessment Cervical Assessment: Within Functional Limits Thoracic Assessment Thoracic Assessment: Exceptions to WFL(rounded) Lumbar Assessment Lumbar Assessment: Exceptions to WFL(posterior pelvic tilt) Postural Control Postural Control: Deficits on evaluation Postural Limitations: delayed on the R  Balance Balance Balance Assessed: Yes Static Sitting Balance Static Sitting - Level of Assistance: 5: Stand by assistance Dynamic Sitting Balance Dynamic Sitting - Balance Support: Left upper extremity supported Dynamic Sitting - Level of Assistance: 4: Min assist Static Standing Balance Static Standing - Balance Support: Bilateral upper extremity supported Static Standing - Level of Assistance: 4: Min assist;3: Mod assist Dynamic Standing Balance Dynamic Standing - Level of Assistance: 3: Mod assist Dynamic Standing - Balance  Activities: Lateral lean/weight shifting Extremity/Trunk Assessment RUE Assessment RUE Assessment: Exceptions to [facility] Passive Range of Motion (PROM) Comments: WFLs General Strength Comments: 2-/5 throughout LUE Assessment LUE Assessment: Within Functional Limits     Refer to Care Plan for Long Term Goals  Recommendations for other services: Therapeutic Recreation  Outing/community reintegration   Discharge Criteria: Patient will be discharged from OT if patient refuses treatment 3 consecutive times without medical reason, if treatment goals not met, if there is a change in medical status, if patient makes no progress towards goals or if patient is discharged from hospital.  The above assessment, treatment plan, treatment alternatives and goals were discussed and mutually agreed upon: by patient  Gypsy Decant MS, OTR/L 12/23/2019, 12:49 PM

## 2019-12-23 NOTE — Progress Notes (Signed)
[place] PHYSICAL MEDICINE & REHABILITATION PROGRESS NOTE   Subjective/Complaints:  Spanish speaking, "dolor" RLE  ROS- denies problem breathing or with stomach, RLE pain, knee and ankle   Objective:   No results found. Recent Labs    12/23/19 0500  WBC 9.2  HGB 14.5  HCT 41.8  PLT 307   Recent Labs    12/22/19 1444 12/23/19 0500  NA  --  137  K  --  3.6  CL  --  99  CO2  --  25  GLUCOSE  --  212*  BUN  --  25*  CREATININE 1.11 1.08  CALCIUM  --  9.2    Intake/Output Summary (Last 24 hours) at 12/23/2019 0813 Last data filed at 12/23/2019 0745 Gross per 24 hour  Intake 480 ml  Output 800 ml  Net -320 ml     Physical Exam: Vital Signs Blood pressure 118/74, pulse 83, temperature 98 F (36.7 C), temperature source Oral, resp. rate 16, height 5\' 4"  (1.626 m), weight 69.7 kg, SpO2 96 %.   General: No acute distress Mood and affect are appropriate Heart: Regular rate and rhythm no rubs murmurs or extra sounds Lungs: Clear to auscultation, breathing unlabored, no rales or wheezes Abdomen: Positive bowel sounds, soft nontender to palpation, nondistended Extremities: No clubbing, cyanosis, or edema Skin: No evidence of breakdown, no evidence of rash Neurologic: Cranial nerves II through XII intact, motor strength is 5/5 in Left and 3-/5 Right deltoid, bicep, tricep, grip, hip flexor, knee extensors, ankle dorsiflexor and plantar flexor   Musculoskeletal: Full range of motion in all 4 extremities. No joint swelling Pain with RIght knee and ankle ROM, no soft tissue swelling or tenderness    Assessment/Plan: 1. 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 which require 3+ hours per day of interdisciplinary therapy in a comprehensive inpatient rehab setting.  Physiatrist is providing close team supervision and 24 hour management of active medical problems listed below.  Physiatrist and rehab team continue to assess barriers to  discharge/monitor patient progress toward functional and medical goals  Care Tool:  Bathing              Bathing assist       Upper Body Dressing/Undressing Upper body dressing   What is the patient wearing?: Hospital gown only    Upper body assist Assist Level: Minimal Assistance - Patient > 75%    Lower Body Dressing/Undressing Lower body dressing            Lower body assist       Toileting Toileting    Toileting assist Assist for toileting: Independent with assistive device Assistive Device Comment: (urinal)   Transfers Chair/bed transfer  Transfers assist           Locomotion Ambulation   Ambulation assist              Walk 10 feet activity   Assist           Walk 50 feet activity   Assist           Walk 150 feet activity   Assist           Walk 10 feet on uneven surface  activity   Assist           Wheelchair     Assist               Wheelchair 50 feet with 2 turns activity  Assist            Wheelchair 150 feet activity     Assist          Blood pressure 118/74, pulse 83, temperature 98 F (36.7 C), temperature source Oral, resp. rate 16, height 5\' 4"  (1.626 m), weight 69.7 kg, SpO2 96 %.  Medical Problem List and Plan: 1.  Impaired cognition with delayed recall, balance deficits, left hemiparesis, difficulty with ADLs secondary to acute left parasagittal pons infarct.             -patient may may shower             -ELOS/Goals: 14-18 days/Supervision/min A.             Admit to CIR 2.  Antithrombotics: -DVT/anticoagulation:  Pharmaceutical: Lovenox             -antiplatelet therapy:  ASA daily.  3. Pain Management: Tylenol as needed, CHeck Right knee xray  4. Mood: LCSW to follow for evaluation and support             -antipsychotic agents: N/A 5. Neuropsych: This patient is?  Fully capable of making decisions on his own behalf. 6. Skin/Wound Care: Routine pressure  relief measures.  7. Fluids/Electrolytes/Nutrition: Monitor I/O.  CMP ordered for tomorrow a.m. 8. HTN: Monitor blood pressures 3 times daily.  Permissive hypertension and resume Prinivil/hctz as indicated.              Vitals:   12/22/19 2014 12/23/19 0511  BP: 135/80 118/74  Pulse: 93 83  Resp: 16 16  Temp: 98.7 F (37.1 C) 98 F (36.7 C)  SpO2: 95% 96%   9.T2DM with hyperglycemia: Metformin on hold due to elevation in creatinine--monitor blood sugars ac/hs and titrate as indicated.              CBG (last 3)  Recent Labs    12/22/19 1645 12/22/19 2203 12/23/19 0602  GLUCAP 208* 200* 195*  Creat improved resume low dose metformin 10.Dyslipidemia: Pravastatin changed to atorvastatin for aggressive management. 11. LBBB: Follow up with cardiology for [facility] after discharge.     LOS: 1 days A FACE TO FACE EVALUATION WAS PERFORMED  Charlett Blake 12/23/2019, 8:13 AM

## 2019-12-23 NOTE — Care Management (Signed)
   Patient Details  Name: Eugene Cooper MRN: [id number] Date of Birth: [birth year]  Today's Date: 12/23/2019  Problem List:  Patient Active Problem List   Diagnosis Date Noted  . Acute ischemic stroke ([place]) 12/22/2019  . Uncontrolled type 2 diabetes mellitus with hyperglycemia, without long-term current use of insulin ([place]) 12/22/2019  . Mixed hyperlipidemia 12/22/2019  . 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 Essential hypertension   . Left bundle branch block   . Stroke ([place]) 12/20/2019  . Right sided weakness 12/19/2019   Past Medical History:  Past Medical History:  Diagnosis Date  . Hyperlipemia   . Hypertension    Past Surgical History: History reviewed. No pertinent surgical history. Social History:  reports that he has never smoked. He has never used smokeless tobacco. He reports previous alcohol use. He reports that he does not use drugs.  Family / Support Systems Children: Son(s) and daughter in law(s) Other Supports: extended family members, grand-daughters  Social History Preferred language: Spanish Religion:  Read: No Write: No   Abuse/Neglect Abuse/Neglect Assessment Can Be Completed: Yes Physical Abuse: Denies Verbal Abuse: Denies Sexual Abuse: Denies Exploitation of patient/patient's resources: Denies Self-Neglect: Denies  Emotional Status Pt's affect, behavior and adjustment status: Normal affect and mood  Patient / Family Perceptions, Expectations & Goals Pt/Family understanding of illness & functional limitations: Daughter in law appears to have a fair understanding of current health status and functional limitations Premorbid pt/family roles/activities: Custodial care bymultiple family members Pt/family expectations/goals: Family would like for the patient to return to his previous functional level  Occupational psychologist available at discharge: Family will provide transportation at discharge  Discharge Planning Living  Arrangements: Children Support Systems: Children Type of Residence: Private residence Does the patient have any problems obtaining your medications?: No Home Management: Eugene Cooper and family will manage home, laundry, cooking, cleaning, etc. Patient/Family Preliminary Plans: Discharge home with family; daughter in law Eugene Cooper works in the evening when her husband is home to assist the patient Social Work Anticipated Follow Up Needs: HH/OP Expected length of stay: 10-14 days  Clinical Impression Patient is very pleasant, reports he is "tired" after his therapy. Participating in therapy. Daughter in law appears to have a fair understanding of current illness and functional limitations. Reports she and other family members were providing custodial care prior to admission; assisting the patient with bathing, dressing as needed. He "helped out around the home" and family would drive if he needed to go out somewhere or to doctor's appointments. She noted premorbid memory issues and required reminders to take his medications.  Dorien Chihuahua B 12/23/2019, 3:33 PM

## 2019-12-23 NOTE — Evaluation (Signed)
Speech Language Pathology Assessment and Plan  Patient Details  Name: Eugene Cooper MRN: [id number] Date of Birth: 1941-03-26  Evaluation Only   Today's Date: 12/23/2019 SLP Individual Time: 1100-1130 SLP Individual Time Calculation (min): 30 min   Problem List:  Patient Active Problem List   Diagnosis Date Noted  . Acute ischemic stroke ([place]) 12/22/2019  . Uncontrolled type 2 diabetes mellitus with hyperglycemia, without long-term current use of insulin ([place]) 12/22/2019  . Mixed hyperlipidemia 12/22/2019  . 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 Essential hypertension   . Left bundle branch block   . Stroke ([place]) 12/20/2019  . Right sided weakness 12/19/2019   Past Medical History:  Past Medical History:  Diagnosis Date  . Hyperlipemia   . Hypertension    Past Surgical History: History reviewed. No pertinent surgical history.  Assessment / Plan / Recommendation Clinical Impression Eugene Cooper is a 79 year old non-English-speaking Hispanic male with history of HTN, T2DM, dyslipidemia who was admitted on 12/29/2019 right-sided weakness and gait abnormality.  Yesterday from chart review due to language and cognition.  Patient reported 2 to 3-day history of having nausea vomiting and diarrhea as well as diffuse abdominal pain with body aches.  Respiratory panel negative.  CT head negative for acute changes.  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  CT abdomen pelvis showed hepatic steatosis with no acute abnormality.  Hepatitis panel was done due to abnormal LFTs and was negative.  MRA head/neck was negative for stenosis or dissection.  Echocardiogram showed ejection fraction of 55 to 60% with mild left ventricular hypertrophy and no wall abnormality.  Therapy evaluation showed impaired cognition with delayed recall, balance deficits and difficulty with ADLs. CIR recommended due to functional decline with admission  12/22/19.   SLP spoke with pt's daughter-in-law who reports that pt is custodial care. He enjoys walking around neighborhood and watching TV. His family provides for all of his wants/needs/meals/bills and he is not able to read or write in [place]. During this evaluation, pt's speech is intelligible at the simple conversation level and doesn't display any word finding deficits. He is oriented x 4, demonstrates selective attention, intellectual awareness and is able to solve anticipatory situations. He is able to describe his current physical deficits as well as dizziness. At baseline, his family states that he needs reminders to take his medicines and at baseline he displays some memory deficits. This was evidenced when he was not able to recall [place]'s President's name after delay. At this time, pt's cognitive linguistic abilities are judged to be at baseline. All education has been completed with pt and his family. He has a supportive discharge plan in place.    Skilled Therapeutic Interventions          Cognitive linguistic evaluation completed.   SLP Assessment  Patient does not need any further Speech [place] [facility]    Recommendations  Patient destination: Home Follow up Recommendations: None Equipment Recommended: None recommended by SLP    SLP Frequency   N/A  SLP Duration  SLP Intensity  SLP Treatment/Interventions  N/A   N/A    N/A   Pain    Prior Functioning Cognitive/Linguistic Baseline: Information not available Type of Home: House  Lives With: Son Available Help at Discharge: Family;Available PRN/intermittently Education: none Vocation: Retired  Programmer, systems Overall Cognitive Status: Within Functional Limits for tasks assessed Arousal/Alertness: Awake/alert Orientation Level: Oriented X4  Comprehension Auditory Comprehension Overall Auditory Comprehension: Appears within  functional limits for tasks  assessed Expression Expression Primary Mode of Expression: Verbal Verbal Expression Overall Verbal Expression: Appears within functional limits for tasks assessed Oral Motor Oral Motor/Sensory Function Overall Oral Motor/Sensory Function: Within functional limits Motor Speech Overall Motor Speech: Appears within functional limits for tasks assessed Respiration: Within functional limits Phonation: Normal Resonance: Within functional limits Articulation: Within functional limitis Intelligibility: Intelligible Motor Planning: Witnin functional limits Motor Speech Errors: Not applicable     Short Term Goals: No short term goals set  Refer to Care Plan for Long Term Goals  Recommendations for other services: None   Discharge Criteria: Patient will be discharged from SLP if patient refuses treatment 3 consecutive times without medical reason, if treatment goals not met, if there is a change in medical status, if patient makes no progress towards goals or if patient is discharged from hospital.  The above assessment, treatment plan, treatment alternatives and goals were discussed and mutually agreed upon: by patient and by family  Randeep Biondolillo 12/23/2019, 12:00 PM

## 2019-12-23 NOTE — Progress Notes (Signed)
Patient information reviewed and entered into [facility] System by Becky Terrianne Cavness, PPS coordinator. Information including medical coding, function ability, and quality indicators will be reviewed and updated through discharge.   

## 2019-12-23 NOTE — Evaluation (Signed)
Physical Therapy Assessment and Plan  Patient Details  Name: Eugene Cooper MRN: [id number] Date of Birth: July 04, 1941  PT Diagnosis: Abnormal posture, Abnormality of gait, Ataxia, Coordination disorder, Difficulty walking, Hemiplegia dominant, Impaired sensation and Muscle weakness Rehab Potential: Good ELOS: 14-18 days   Today's Date: 12/23/2019 PT Individual Time: 1410-1510 PT Individual Time Calculation (min): 60 min    Problem List:  Patient Active Problem List   Diagnosis Date Noted  . Acute ischemic stroke ([place]) 12/22/2019  . Uncontrolled type 2 diabetes mellitus with hyperglycemia, without long-term current use of insulin ([place]) 12/22/2019  . Mixed hyperlipidemia 12/22/2019  . Left pontine stroke ([place]) 12/22/2019  . Essential hypertension   . Left bundle branch block   . Stroke ([place]) 12/20/2019  . Right sided weakness 12/19/2019    Past Medical History:  Past Medical History:  Diagnosis Date  . Hyperlipemia   . Hypertension    Past Surgical History: History reviewed. No pertinent surgical history.  Assessment & Plan Clinical Impression: Patient is a 79 year old non-English-speaking Hispanic male with history of HTN, T2DM, dyslipidemia who was admitted on 12/29/2019 right-sided weakness and gait abnormality.  Yesterday from chart review due to language and cognition.  Patient reported 2 to 3-day history of having nausea vomiting and diarrhea as well as diffuse abdominal pain with body aches.  Respiratory panel negative.  CT head negative for acute changes.  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  CT abdomen pelvis showed hepatic steatosis with no acute abnormality.  Hepatitis panel was done due to abnormal LFTs and was negative.  MRA head/neck was negative for stenosis or dissection.  Echocardiogram showed ejection fraction of 55 to 60% with mild left ventricular hypertrophy and no wall abnormality.  ASA recommended  stroke for secondary stroke prevention. EKG showed left bundle branch block and Dr. Branch/cardiology recommended Carlton Adam on outpatient basis for work-up.  Therapy evaluation showed impaired cognition with delayed recall, balance deficits and difficulty with ADLs. CIR recommended due to functional decline.  Patient transferred to CIR on 12/22/2019 .   Patient currently requires mod with mobility secondary to muscle weakness, muscle joint tightness and muscle paralysis, decreased cardiorespiratoy endurance, impaired timing and sequencing, unbalanced muscle activation, ataxia and decreased coordination, decreased visual perceptual skills, decreased attention to right and decreased sitting balance, decreased standing balance, decreased postural control, hemiplegia and decreased balance strategies.  Prior to hospitalization, patient was modified independent  with mobility and lived with Son in a House home.  Home access is 6Stairs to enter.  Patient will benefit from skilled PT intervention to maximize safe functional mobility, minimize fall risk and decrease caregiver burden for planned discharge home with 24 hour supervision.  Anticipate patient will benefit from follow up [place] at discharge.  PT - End of Session Endurance Deficit: Yes PT Assessment Rehab Potential (ACUTE/IP ONLY): Good PT Barriers to Discharge: Inaccessible home environment;Decreased caregiver support;Home environment access/layout;Medication compliance PT Patient demonstrates impairments in the following area(s): Balance;Behavior;Edema;Endurance;Motor PT Transfers Functional Problem(s): Bed Mobility PT Locomotion Functional Problem(s): Ambulation;Wheelchair Mobility;Stairs PT Plan PT Intensity: Minimum of 1-2 x/day ,45 to 90 minutes PT Frequency: 5 out of 7 days PT Duration Estimated Length of Stay: 14-18 days PT Treatment/Interventions: Ambulation/gait training;Discharge planning;Functional mobility training;Visual/perceptual  remediation/compensation;Therapeutic Activities;Psychosocial support;Wheelchair propulsion/positioning;Therapeutic Exercise;Skin care/wound management;Neuromuscular re-education;Disease management/prevention;Balance/vestibular training;Cognitive remediation/compensation;DME/adaptive equipment instruction;Splinting/orthotics;UE/LE Strength taining/ROM;Pain management;UE/LE Coordination activities;Stair training;Patient/family education;Functional electrical stimulation;Community reintegration PT Transfers Anticipated Outcome(s): Supervision assist with LRAD PT Locomotion Anticipated Outcome(s): Ambulatory with LRAD and supervision assist  PT Recommendation Follow Up Recommendations: Home health PT Patient destination: Home Equipment Recommended: Rolling walker with 5" wheels;Wheelchair (measurements);Wheelchair cushion (measurements)  Skilled Therapeutic Intervention Pt received supine in bed and agreeable to PT. Supine>sit transfer with mod assist and moderate cues for sequencing and use of RUE to push into sitting. PT instructed patient in PT Evaluation and initiated treatment intervention; see below for results. PT educated patient in [place], rehab potential, rehab goals, and discharge recommendations. Stand pivot transfer to WC. Mild complaints of dizziness in standing. PT assessed orthostatic BP. Sitting 135/85 HR115 . Pt unable to remain standing long enough to attain BP. PT applied ted hose. Standing BP 87/71, HR 109. Return to sitting, 133/86. Pt severely symptomatic. Due to severe orthostatic hypotension, Gait training and Stair training deferred at this time. Car transfer with max assist for safety with squat pivot and max cues for decreased use of UE support from moving car parts; no change with instruction from PT. Kinetron reciprocal movement training 4 x 45sec with mod assist from PT for improved RLE positioning and to maintain anterior weight shift to prevent posterior LOB. Pt reports need for BM.  Toilet transfer with mod assist. Pt able to complete pericare with CGA for balance clothing management performed by PT while PT blocking the RLE with mod assist.  Pt returned to room and performed stand pivot transfer to bed with mod assist and R knee blocked . Sit>supine completed with mod assist for BLE management,  and left supine in bed with call bell in reach and all needs met.        PT Evaluation Precautions/Restrictions Precautions Precautions: Fall Restrictions Weight Bearing Restrictions: No General   Vital SignsTherapy Vitals Temp: 99.1 F (37.3 C) Temp Source: Oral Pulse Rate: (!) 106 BP: (!) 87/71 Patient Position (if appropriate): Standing Oxygen Therapy SpO2: 93 % Pain Pain Assessment Pain Scale: 0-10 Pain Score: 0-No pain Home Living/Prior Functioning Home Living Living Arrangements: Children Available Help at Discharge: Family;Available PRN/intermittently Type of Home: House Home Access: Stairs to enter [employer] of Steps: 6 Entrance Stairs-Rails: Right Home Layout: One level Bathroom Accessibility: Yes  Lives With: Son Prior Function Level of Independence: Independent with basic ADLs;Independent with gait;Needs assistance with homemaking;Independent with transfers Vocation: Retired Comments: independent with ADLs and mobility, does not drive Vision/Perception     Cognition Overall Cognitive Status: Within Functional Limits for tasks assessed Arousal/Alertness: Awake/alert Memory: Impaired Memory Impairment: Decreased recall of new information;Retrieval deficit Immediate Memory Recall: Sock;Blue;Bed Memory Recall Sock: Without Cue Memory Recall Blue: Not able to recall Memory Recall Bed: Not able to recall Safety/Judgment: Impaired Sensation Sensation Light Touch: Impaired by gross assessment Coordination Gross Motor Movements are Fluid and Coordinated: No Fine Motor Movements are Fluid and Coordinated: No Coordination and  Movement Description: R hemiplegia Motor  Motor Motor: Hemiplegia Motor - Skilled Clinical Observations: R hemiplegia and mild RLE ataxia.  Mobility Bed Mobility Bed Mobility: Rolling Right;Rolling Left;Supine to Sit;Sit to Supine Rolling Right: Minimal Assistance - Patient > 75% Rolling Left: Minimal Assistance - Patient > 75% Supine to Sit: Moderate Assistance - Patient 50-74% Sit to Supine: Moderate Assistance - Patient 50-74% Transfers Transfers: Sit to Stand;Stand Pivot Transfers Sit to Stand: Moderate Assistance - Patient 50-74% Stand Pivot Transfers: Moderate Assistance - Patient 50 - 74% Transfer (Assistive device): None Locomotion  Gait Ambulation: No(severe orthostatic hypotension limits safety.) Gait Gait: No Stairs / Additional Locomotion Stairs: No Wheelchair Mobility Wheelchair Mobility: Yes Wheelchair Assistance: Maximal Assistance - Patient 25 -  49% Wheelchair Propulsion: Both upper extremities Wheelchair Parts Management: Needs assistance Distance: 75  Trunk/Postural Assessment  Cervical Assessment Cervical Assessment: Within Functional Limits Thoracic Assessment Thoracic Assessment: Exceptions to WFL(rounded and rotated  L) Lumbar Assessment Lumbar Assessment: Exceptions to WFL(posterior pelvic tilt) Postural Control Postural Control: Deficits on evaluation Postural Limitations: delayed on the R  Balance Balance Balance Assessed: Yes Static Sitting Balance Static Sitting - Level of Assistance: 5: Stand by assistance Dynamic Sitting Balance Dynamic Sitting - Balance Support: Left upper extremity supported Dynamic Sitting - Level of Assistance: 4: Min Insurance risk surveyor Standing - Balance Support: Bilateral upper extremity supported Static Standing - Level of Assistance: 4: Min assist;3: Mod assist Dynamic Standing Balance Dynamic Standing - Level of Assistance: 3: Mod assist Dynamic Standing - Balance Activities: Lateral  lean/weight shifting Extremity Assessment      RLE Assessment RLE Assessment: Exceptions to [facility] General Strength Comments: grossly 3+/5 proximal to distal LLE Assessment LLE Assessment: Within Functional Limits General Strength Comments: grossly 4+/5 proximal to distal    Refer to Care Plan for Long Term Goals  Recommendations for other services: None   Discharge Criteria: Patient will be discharged from PT if patient refuses treatment 3 consecutive times without medical reason, if treatment goals not met, if there is a change in medical status, if patient makes no progress towards goals or if patient is discharged from hospital.  The above assessment, treatment plan, treatment alternatives and goals were discussed and mutually agreed upon: by patient  Lorie Phenix 12/23/2019, 4:25 PM

## 2019-12-24 ENCOUNTER — Inpatient Hospital Stay (HOSPITAL_COMMUNITY): Payer: Medicare Other | Admitting: Physical Therapy

## 2019-12-24 ENCOUNTER — Inpatient Hospital Stay (HOSPITAL_COMMUNITY): Payer: Medicare Other | Admitting: Occupational Therapy

## 2019-12-24 DIAGNOSIS — I69351 Hemiplegia and hemiparesis following cerebral infarction affecting right dominant side: Secondary | ICD-10-CM | POA: Diagnosis not present

## 2019-12-24 DIAGNOSIS — I6350 Cerebral infarction due to unspecified occlusion or stenosis of unspecified cerebral artery: Secondary | ICD-10-CM | POA: Diagnosis not present

## 2019-12-24 LAB — GLUCOSE, CAPILLARY
Glucose-Capillary: 166 mg/dL — ABNORMAL HIGH (ref 70–99)
Glucose-Capillary: 173 mg/dL — ABNORMAL HIGH (ref 70–99)
Glucose-Capillary: 181 mg/dL — ABNORMAL HIGH (ref 70–99)
Glucose-Capillary: 177 mg/dL — ABNORMAL HIGH (ref 70–99)

## 2019-12-24 MED ORDER — GABAPENTIN 100 MG PO CAPS
100.0000 mg | ORAL_CAPSULE | Freq: Three times a day (TID) | ORAL | Status: DC
  Administered 2019-12-24 – 2020-01-07 (×43): 100 mg via ORAL
  Filled 2019-12-24 (×43): qty 1

## 2019-12-24 NOTE — IPOC Note (Signed)
Overall Plan of Care [facility]) Patient Details Name: Eugene Cooper MRN: [id number] DOB: 06/28/1941  Admitting Diagnosis: Left pontine stroke [facility])  Hospital Problems: Principal Problem:   Left pontine stroke ([place])     Functional Problem List: Nursing Motor, Medication Management, Safety  PT Balance, Behavior, Edema, Endurance, Motor  OT Balance, Endurance, Pain, Safety  SLP    TR         Basic ADL's: OT Grooming, Bathing, Dressing, Toileting     Advanced  ADL's: OT       Transfers: PT Bed Mobility  OT Toilet, Tub/Shower     Locomotion: PT Ambulation, Wheelchair Mobility, Stairs     Additional Impairments: OT Fuctional Use of Upper Extremity  SLP None      TR      Anticipated Outcomes Item Anticipated Outcome  Self Feeding supervision  Swallowing      Basic self-care  supervision  Toileting  supervision   Bathroom Transfers supervision  Bowel/Bladder  To remain continent for the duration RH  Transfers  Supervision assist with LRAD  Locomotion  Ambulatory with LRAD and supervision assist  Communication     Cognition     Pain  <2  Safety/Judgment  To be able to call for help with Mod I   Therapy Plan: PT Intensity: Minimum of 1-2 x/day ,45 to 90 minutes PT Frequency: 5 out of 7 days PT Duration Estimated Length of Stay: 14-18 days OT Intensity: Minimum of 1-2 x/day, 45 to 90 minutes OT Frequency: 5 out of 7 days OT Duration/Estimated Length of Stay: 14- 18 days     Due to the current state of emergency, patients may not be receiving their 3-hours of Medicare-mandated therapy.   Team Interventions: Nursing Interventions Patient/Family Education, Medication Management, Disease Management/Prevention, Discharge Planning  PT interventions Ambulation/gait training, Discharge planning, Functional mobility training, Visual/perceptual remediation/compensation, Therapeutic Activities, Psychosocial support, Wheelchair propulsion/positioning,  Therapeutic Exercise, Skin care/wound management, Neuromuscular re-education, Disease management/prevention, Training and development officer, Cognitive remediation/compensation, DME/adaptive equipment instruction, Splinting/orthotics, UE/LE Strength taining/ROM, Pain management, UE/LE Coordination activities, Stair training, Patient/family education, Functional electrical stimulation, Community reintegration  OT Interventions Training and development officer, Engineer, drilling, Patient/family education, Therapeutic Activities, Wheelchair propulsion/positioning, Therapeutic Exercise, Psychosocial support, Functional electrical stimulation, Cognitive remediation/compensation, Community reintegration, Functional mobility training, Self Care/advanced ADL retraining, UE/LE Strength taining/ROM, Discharge planning, Neuromuscular re-education, UE/LE Coordination activities, Pain management  SLP Interventions    TR Interventions    SW/CM Interventions Discharge Planning, Disease Management/Prevention, Psychosocial Support, Patient/Family Education   Barriers to Discharge MD  Medical stability and Home enviroment access/loayout  Nursing      PT Inaccessible home environment, Decreased caregiver support, Home environment access/layout, Medication compliance    OT Other (comments) none known at this time  SLP      SW       Team Discharge Planning: Destination: PT-Home ,OT- Home , SLP-Home Projected Follow-up: PT-Home health PT, OT-  Home health OT, 24 hour supervision/assistance, SLP-None Projected Equipment Needs: PT-Rolling walker with 5" wheels, Wheelchair (measurements), Wheelchair cushion (measurements), OT- To be determined, SLP-None recommended by SLP Equipment Details: PT- , OT-  Patient/family involved in discharge planning: PT- Patient,  OT-Patient, SLP-Patient, Family member/caregiver  MD ELOS: 14-18d Medical Rehab Prognosis:  Good Assessment:  79 year old non-English-speaking  Hispanic male with history of HTN, T2DM, dyslipidemia who was admitted on 12/29/2019 right-sided weakness and gait abnormality.  Yesterday from chart review due to language and cognition.  Patient reported 2 to 3-day history of having nausea vomiting and diarrhea as  well as diffuse abdominal pain with body aches.  Respiratory panel negative.  CT head negative for acute changes.  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  CT abdomen pelvis showed hepatic steatosis with no acute abnormality.  Hepatitis panel was done due to abnormal LFTs and was negative.  MRA head/neck was negative for stenosis or dissection.  Echocardiogram showed ejection fraction of 55 to 60% with mild left ventricular hypertrophy and no wall abnormality.  ASA recommended stroke for secondary stroke prevention. EKG showed left bundle branch block and Dr. Branch/cardiology recommended Carlton Adam on outpatient basis for work-up.  Therapy evaluation showed impaired cognition with delayed recall, balance deficits and difficulty with ADLs.    Now requiring 24/7 Rehab RN,MD, as well as CIR level PT, OT and SLP.  Treatment team will focus on ADLs and mobility with goals set at Supervision See Team Conference Notes for weekly updates to the plan of care

## 2019-12-24 NOTE — Progress Notes (Signed)
Physical Therapy Session Note  Patient Details  Name: Eugene Cooper MRN: [id number] Date of Birth: Mar 12, 1941  Today's Date: 12/24/2019 PT Individual Time: 1115-1208 and 1400-1510 PT Individual Time Calculation (min): 53 min and 70 min    Short Term Goals: Week 1:  PT Short Term Goal 1 (Week 1): Pt will performed bed mobility with min assist PT Short Term Goal 2 (Week 1): Pt will transfer to [facility] with min assist and LRAD consistently PT Short Term Goal 3 (Week 1): Pt will ambulate 58f with mod assist and LRAD PT Short Term Goal 4 (Week 1): Pt will initiate stair management training  Skilled Therapeutic Interventions/Progress Updates:   Session 1    Pt received supine in bed and agreeable to PT. PT obtained 16x16 hemi height WC for pt.  Supine>sit transfer with min assist and cues for improved use and awareness of the LUE. Orthostatic vital signs: supine: 131/82. Sitting: 137/94. Standing 105/89. Sitting 130/81. Pt reports only mild s/s of orthostasis today.  [facility] the [facility] with BUE supported on RW. Cues for safety, posture, and sequencing to prevent posterior LOB.   Pt transported to rehab gym. Sit<>stand from [place] No complaints of dizziness. Gait training with RW, R hand splint and +2 for WC follow x [id number] mod-max assist to advance the R LE and block the R knee in stance to prevent fall.   Stair management training on 3 inch step x 2  with max assist due to severe R knee instability. Max cues for posture, sequencing, and step to gait pattern.   WC mobility [id number] L hemi technique and min-mod assist to maintain straight path and moderate cues for improved use of LLE to prevent veer to the R.   Throughout treatment, sit<>stand performed with min assist and min cues to push form the WC with the LUE. Patient returned to room and left sitting in WC [facility] call bell in reach and all needs met.   Session 2.   Pt received supine in bed and  agreeable to PT. Supine>sit transfer with min assist and min cues for use of RUE to push in to sitting position. Squat  pivot [facility] to R knee instability.  Pt transported to day room. Squat pivot transfer to nutep with min assist and improved stability in the RLE. Nustep BLE and BUE  With R hand splint and L thigh support x 8 minutes + 4 min with BLE only. Cues for  decreased speed, symmetry of movement and initiation of movement through the RLE.   Dynamic balance training performed by pt: foot taps on 2 inch step reciprocally x 6 LE and blocked R and L x 5 each. Mod assist overall with max cues for upright posture, terminal knee extension, and use of visual feedback of mirror to maintain midline. Pt also performed lateral reach to the L to force increased WB over the LLE and encourage terminal knee extension on the RLE to improve weight shifting.   Pt returned to room and performed ambulatory transfer to bed with mod assist andRW x [id number] terminal knee extension and step length on the RLE noted from previous session. Sit>supine completed with CGA for RLE management. Pt left supine in bed with call bell in reach and all needs met.         Therapy Documentation Precautions:  Precautions Precautions: Fall Restrictions Weight Bearing Restrictions: No Pain: denies  Therapy/Group: Individual Therapy  Lorie Phenix 12/24/2019, 12:28 PM

## 2019-12-24 NOTE — Progress Notes (Signed)
[place] PHYSICAL MEDICINE & REHABILITATION PROGRESS NOTE   Subjective/Complaints:  Pensions consultant [place] Pt c/o pain on RIght side described as "numbness"  Pain worse at night , is aggravated by movement   ROS- denies problem breathing or with stomach, RLE pain, knee and ankle   Objective:   DG Knee 1-2 Views Right  Result Date: 12/23/2019 CLINICAL DATA:  Right knee pain for 8 days EXAM: RIGHT KNEE - 1-2 VIEW COMPARISON:  None. FINDINGS: No fracture or dislocation of the right knee. Mild tricompartmental joint space narrowing and osteophytosis. There is a moderate, nonspecific knee joint effusion. Soft tissues are unremarkable. IMPRESSION: 1. No fracture or dislocation of the right knee. 2. Mild tricompartmental joint space narrowing and osteophytosis. 3. There is a moderate, nonspecific knee joint effusion. Electronically Signed   By: Eddie Candle M.D.   On: 12/23/2019 11:09   Recent Labs    12/23/19 0500  WBC 9.2  HGB 14.5  HCT 41.8  PLT 307   Recent Labs    12/22/19 1444 12/23/19 0500  NA  --  137  K  --  3.6  CL  --  99  CO2  --  25  GLUCOSE  --  212*  BUN  --  25*  CREATININE 1.11 1.08  CALCIUM  --  9.2    Intake/Output Summary (Last 24 hours) at 12/24/2019 0842 Last data filed at 12/24/2019 0530 Gross per 24 hour  Intake 460 ml  Output 775 ml  Net -315 ml     Physical Exam: Vital Signs Blood pressure 97/84, pulse 87, temperature 98.2 F (36.8 C), temperature source Oral, resp. rate 16, height 5\' 4"  (1.626 m), weight 71 kg, SpO2 96 %.   General: No acute distress Mood and affect are appropriate Heart: Regular rate and rhythm no rubs murmurs or extra sounds Lungs: Clear to auscultation, breathing unlabored, no rales or wheezes Abdomen: Positive bowel sounds, soft nontender to palpation, nondistended Extremities: No clubbing, cyanosis, or edema Skin: No evidence of breakdown, no evidence of rash Neurologic: Cranial nerves II through XII intact,  motor strength is 5/5 in Left and 3-/5 Right deltoid, bicep, tricep, grip, hip flexor, knee extensors, ankle dorsiflexor and plantar flexor   Musculoskeletal: Full range of motion in all 4 extremities. No joint swelling Pain with RIght knee and ankle ROM, no soft tissue swelling or tenderness    Assessment/Plan: 1. 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 which require 3+ hours per day of interdisciplinary therapy in a comprehensive inpatient rehab setting.  Physiatrist is providing close team supervision and 24 hour management of active medical problems listed below.  Physiatrist and rehab team continue to assess barriers to discharge/monitor patient progress toward functional and medical goals  Care Tool:  Bathing              Bathing assist       Upper Body Dressing/Undressing Upper body dressing   What is the patient wearing?: Pull over shirt    Upper body assist Assist Level: Moderate Assistance - Patient 50 - 74%    Lower Body Dressing/Undressing Lower body dressing      What is the patient wearing?: Pants, Underwear/pull up     Lower body assist Assist for lower body dressing: Total Assistance - Patient < 25%     Toileting Toileting    Toileting assist Assist for toileting: Moderate Assistance - Patient 50 - 74% Assistive Device Comment: urinal   Transfers Chair/bed  transfer  Transfers assist     Chair/bed transfer assist level: Moderate Assistance - Patient 50 - 74%     Locomotion Ambulation   Ambulation assist   Ambulation activity did not occur: Safety/medical concerns          Walk 10 feet activity   Assist  Walk 10 feet activity did not occur: Safety/medical concerns        Walk 50 feet activity   Assist Walk 50 feet with 2 turns activity did not occur: Safety/medical concerns         Walk 150 feet activity   Assist Walk 150 feet activity did not occur: Safety/medical concerns          Walk 10 feet on uneven surface  activity   Assist Walk 10 feet on uneven surfaces activity did not occur: Safety/medical concerns         Wheelchair     Assist Will patient use wheelchair at discharge?: Yes Type of Wheelchair: Manual    Wheelchair assist level: Maximal Assistance - Patient 25 - 49% Max wheelchair distance: 75    Wheelchair 50 feet with 2 turns activity    Assist        Assist Level: Maximal Assistance - Patient 25 - 49%   Wheelchair 150 feet activity     Assist  Wheelchair 150 feet activity did not occur: Safety/medical concerns       Blood pressure 97/84, pulse 87, temperature 98.2 F (36.8 C), temperature source Oral, resp. rate 16, height 5\' 4"  (1.626 m), weight 71 kg, SpO2 96 %.  Medical Problem List and Plan: 1.  Impaired cognition with delayed recall, balance deficits, left hemiparesis, difficulty with ADLs secondary to acute left parasagittal pons infarct.             -patient may may shower             -ELOS/Goals: 14-18 days/Supervision/min A.             Admit to CIR 2.  Antithrombotics: -DVT/anticoagulation:  Pharmaceutical: Lovenox             -antiplatelet therapy:  ASA daily.  3. 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 Mood: LCSW to follow for evaluation and support             -antipsychotic agents: N/A 5. Neuropsych: This patient is?  Fully capable of making decisions on his own behalf. 6. Skin/Wound Care: Routine pressure relief measures.  7. Fluids/Electrolytes/Nutrition: Monitor I/O.  CMP ordered for tomorrow a.m. 8. HTN: Monitor blood pressures 3 times daily.  Permissive hypertension and resume Prinivil/hctz as indicated.              Vitals:   12/23/19 1943 12/24/19 0524  BP: 127/79 97/84  Pulse: 90 87  Resp: 16 16  Temp: 98.7 F (37.1 C) 98.2 F (36.8 C)  SpO2: 95% 96%   9.T2DM with  hyperglycemia: Metformin on hold due to elevation in creatinine--monitor blood sugars ac/hs and titrate as indicated.              CBG (last 3)  Recent Labs    12/23/19 1711 12/23/19 2111 12/24/19 0615  GLUCAP 159* 164* 166*  Creat improved resume low dose metformin  10.Dyslipidemia: Pravastatin changed to atorvastatin for aggressive management. 11. LBBB: Follow up with cardiology for [facility] after discharge.     LOS: 2  days A FACE TO FACE EVALUATION WAS PERFORMED  Charlett Blake 12/24/2019, 8:42 AM

## 2019-12-24 NOTE — Progress Notes (Signed)
Occupational Therapy Session Note  Patient Details  Name: Eugene Cooper MRN: [id number] Date of Birth: 1941-07-24  Today's Date: 12/24/2019 OT Individual Time: 0900-1000 OT Individual Time Calculation (min): 60 min   Short Term Goals: Week 1:  OT Short Term Goal 1 (Week 1): Pt will maintain standing balance with min A for LB clothing management. OT Short Term Goal 2 (Week 1): Pt will perform UB dressing with min A overall. OT Short Term Goal 3 (Week 1): Pt will perform toilet transfer with min A overall. OT Short Term Goal 4 (Week 1): Pt will perform bathing at shower level with min A and modifications as needed.  Skilled Therapeutic Interventions/Progress Updates:    Pt greeted in bed with no c/o pain. Agreeable to shower. Supine<sit completed with steady assist and Mod A for stand pivot>w/c<TTB. Pt then bathed at sit<stand level, assist for washing feet and placing R LE into modified figure 4 due to increased tightness and discomfort. Mod HOH to incorporate R UE to wash Lt side. He initiated placing Rt hand on grab bar for sit<stands and while standing. Dressing was then completed w/c level at sink sit<stand. Mod A for donning overhead shirt with instruction on hemi techniques. Max A for LB dressing and Mod A for sit<stand. OT donned Teds and gripper socks. Pt attempted socks himself but this was too difficult. He then completed oral care while sitting at the sink with HOH to incorporate affected UE. Active assist for reaching across the sink to put his toothpaste back in the small tray. He requested to return to bed after. Mod A for stand pivot<bed and also for returning to supine. Pt was left in bed with all needs within reach and bed alarm set.   Interpretor present during session   Therapy Documentation Precautions:  Precautions Precautions: Fall Restrictions Weight Bearing Restrictions: No ADL:        Therapy/Group: Individual Therapy  [name] [name] 12/24/2019, 11:40  AM

## 2019-12-25 ENCOUNTER — Inpatient Hospital Stay (HOSPITAL_COMMUNITY): Payer: Medicare Other | Admitting: Occupational Therapy

## 2019-12-25 ENCOUNTER — Inpatient Hospital Stay (HOSPITAL_COMMUNITY): Payer: Medicare Other | Admitting: Physical Therapy

## 2019-12-25 DIAGNOSIS — E1165 Type 2 diabetes mellitus with hyperglycemia: Secondary | ICD-10-CM | POA: Diagnosis not present

## 2019-12-25 DIAGNOSIS — M792 Neuralgia and neuritis, unspecified: Secondary | ICD-10-CM

## 2019-12-25 DIAGNOSIS — R7309 Other abnormal glucose: Secondary | ICD-10-CM | POA: Diagnosis not present

## 2019-12-25 DIAGNOSIS — I6350 Cerebral infarction due to unspecified occlusion or stenosis of unspecified cerebral artery: Secondary | ICD-10-CM | POA: Diagnosis not present

## 2019-12-25 DIAGNOSIS — I10 Essential (primary) hypertension: Secondary | ICD-10-CM | POA: Diagnosis not present

## 2019-12-25 DIAGNOSIS — I69351 Hemiplegia and hemiparesis following cerebral infarction affecting right dominant side: Secondary | ICD-10-CM | POA: Diagnosis not present

## 2019-12-25 LAB — GLUCOSE, CAPILLARY
Glucose-Capillary: 184 mg/dL — ABNORMAL HIGH (ref 70–99)
Glucose-Capillary: 147 mg/dL — ABNORMAL HIGH (ref 70–99)
Glucose-Capillary: 183 mg/dL — ABNORMAL HIGH (ref 70–99)
Glucose-Capillary: 220 mg/dL — ABNORMAL HIGH (ref 70–99)

## 2019-12-25 MED ORDER — METFORMIN HCL 500 MG PO TABS
500.0000 mg | ORAL_TABLET | Freq: Every day | ORAL | Status: DC
  Administered 2019-12-25 – 2019-12-27 (×3): 500 mg via ORAL
  Filled 2019-12-25 (×2): qty 1

## 2019-12-25 NOTE — Progress Notes (Signed)
Occupational Therapy Session Note  Patient Details  Name: Eugene Cooper MRN: [id number] Date of Birth: Jan 13, 1941  Today's Date: 12/25/2019 OT Individual Time: [id number] OT Individual Time Calculation (min): 57 min and 56 min  Short Term Goals: Week 1:  OT Short Term Goal 1 (Week 1): Pt will maintain standing balance with min A for LB clothing management. OT Short Term Goal 2 (Week 1): Pt will perform UB dressing with min A overall. OT Short Term Goal 3 (Week 1): Pt will perform toilet transfer with min A overall. OT Short Term Goal 4 (Week 1): Pt will perform bathing at shower level with min A and modifications as needed.  Skilled Therapeutic Interventions/Progress Updates:    Pt greeted in bed with c/o pain in left lower leg. RN in at start of session to provide pain medicine. Supine<sit completed with Min A and Mod A for stand pivot<w/c<toilet<w/c<TTB. Note pt had continent bladder void while sitting on toilet. He then bathed at sit<stand level in shower. Mod HOH and vcs to use Rt hand to wash Lt side. Min A for sit<stand with B UE support on grab bar while OT completed perihygiene. Once in w/c, pt required verbal and tactile cues for hemi dressing strategies. Able to don overhead shirt with Min A today! Max A for LB with increased pain when attempting to place R LE into figure 4 position. Max A for dynamic standing balance while he assisted with elevating pants on Lt side. Total A for Teds and footwear. While sitting at the sink, he required Mod HOH to use Rt hand to stabilize shaving cream. He needed A for thoroughness during shaving task. Pt was agreeable to remain up in the w/c at end of session. Left him with all needs and safety belt fastened. Tx focus placed on functional transfers, ADL retraining, balance, and Rt NMR.   Interpretor present during session   2nd Session 1:1 tx (56 min) Pt greeted in bed, reporting pain in Lt lower leg. RN in during session to provide  pain medicine. Started with toileting. Supine<sit completed with supervision assist and Mod A for stand pivot<w/c>elevated toilet. Mod-Max A for dynamic standing balance during clothing mgt x2. Pt had continent bladder void while sitting on toilet. He then completed handwashing and oral care while sitting at the sink. Vcs for including Rt UE during these tasks, Mod HOH while brushing teeth, including using affected UE to reach for and put away items. Transitioned to w/c level task in the dayroom involving dynamic reaching and throwing with the R UE. He required verbal and tactile cues for minimizing shoulder hike and truncal compensatory strategies. Pt very pleased that he could grasp/release bean bags with Rt during this NMR task. He was then escorted back to room and completed stand pivot<bed with Mod A. After doffing shoes pt returned to supine and was able to reposition himself in bed with instruction. Pt remained in bed with all needs within reach and bed alarm set.   Interpretor present during session   Therapy Documentation Precautions:  Precautions Precautions: Fall Restrictions Weight Bearing Restrictions: No Vital Signs: Therapy Vitals Temp: 98.3 F (36.8 C) Pulse Rate: 85 Resp: 16 BP: 126/73 Patient Position (if appropriate): Lying Oxygen Therapy SpO2: 96 % O2 Device: Room Air ADL:     Therapy/Group: Individual Therapy  Ketsia Linebaugh A Kriss Perleberg 12/25/2019, 4:10 PM

## 2019-12-25 NOTE — Progress Notes (Signed)
[place] PHYSICAL MEDICINE & REHABILITATION PROGRESS NOTE   Subjective/Complaints: Patient seen laying in bed this morning.  No reported issues overnight.  He indicates that he has used the restroom, notified nursing.  ROS: Limited due to language, but appears to deny CP, shortness of breath, nausea, vomiting or diarrhea.  Objective:   No results found. Recent Labs    12/23/19 0500  WBC 9.2  HGB 14.5  HCT 41.8  PLT 307   Recent Labs    12/22/19 1444 12/23/19 0500  NA  --  137  K  --  3.6  CL  --  99  CO2  --  25  GLUCOSE  --  212*  BUN  --  25*  CREATININE 1.11 1.08  CALCIUM  --  9.2    Intake/Output Summary (Last 24 hours) at 12/25/2019 1227 Last data filed at 12/25/2019 0807 Gross per 24 hour  Intake 720 ml  Output --  Net 720 ml     Physical Exam: Vital Signs Blood pressure 131/83, pulse 78, temperature 98.4 F (36.9 C), resp. rate 18, height 5\' 4"  (1.626 m), weight 72 kg, SpO2 96 %. Constitutional: No distress . Vital signs reviewed. HENT: Normocephalic.  Atraumatic. Eyes: EOMI. No discharge. Cardiovascular: No JVD. Respiratory: Normal effort.  No stridor. GI: Non-distended. Skin: Warm and dry.  Intact. Psych: Normal mood.  Normal behavior. Musc: No edema in extremities.  No tenderness in extremities. Neurologic: Alert Motor: RUE/RLE: ?  2+/5 throughout  Assessment/Plan: 1. 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 which require 3+ hours per day of interdisciplinary therapy in a comprehensive inpatient rehab setting.  Physiatrist is providing close team supervision and 24 hour management of active medical problems listed below.  Physiatrist and rehab team continue to assess barriers to discharge/monitor patient progress toward functional and medical goals  Care Tool:  Bathing    Body parts bathed by patient: Right arm, Chest, Abdomen, Front perineal area, Right upper leg, Left upper leg, Face   Body parts bathed by  helper: Left arm, Buttocks, Right lower leg, Left lower leg     Bathing assist Assist Level: Moderate Assistance - Patient 50 - 74%     Upper Body Dressing/Undressing Upper body dressing Upper body dressing/undressing activity did not occur (including orthotics): N/A What is the patient wearing?: Pull over shirt    Upper body assist Assist Level: Minimal Assistance - Patient > 75%    Lower Body Dressing/Undressing Lower body dressing      What is the patient wearing?: Pants     Lower body assist Assist for lower body dressing: Maximal Assistance - Patient 25 - 49%     Toileting Toileting Toileting Activity did not occur (Clothing management and hygiene only): Refused  Toileting assist Assist for toileting: Moderate Assistance - Patient 50 - 74% Assistive Device Comment: urinal   Transfers Chair/bed transfer  Transfers assist     Chair/bed transfer assist level: Moderate Assistance - Patient 50 - 74%     Locomotion Ambulation   Ambulation assist   Ambulation activity did not occur: Safety/medical concerns  Assist level: 2 helpers Assistive device: Walker-rolling Max distance: 60   Walk 10 feet activity   Assist  Walk 10 feet activity did not occur: Safety/medical concerns  Assist level: 2 helpers Assistive device: Walker-rolling   Walk 50 feet activity   Assist Walk 50 feet with 2 turns activity did not occur: Safety/medical concerns  Assist level: 2 helpers Assistive device:  Walker-rolling    Walk 150 feet activity   Assist Walk 150 feet activity did not occur: Safety/medical concerns         Walk 10 feet on uneven surface  activity   Assist Walk 10 feet on uneven surfaces activity did not occur: Safety/medical concerns         Wheelchair     Assist Will patient use wheelchair at discharge?: Yes Type of Wheelchair: Manual    Wheelchair assist level: Minimal Assistance - Patient > 75% Max wheelchair distance: 75     Wheelchair 50 feet with 2 turns activity    Assist        Assist Level: Minimal Assistance - Patient > 75%   Wheelchair 150 feet activity     Assist  Wheelchair 150 feet activity did not occur: Safety/medical concerns       Blood pressure 131/83, pulse 78, temperature 98.4 F (36.9 C), resp. rate 18, height 5\' 4"  (1.626 m), weight 72 kg, SpO2 96 %.  Medical Problem List and Plan: 1.  Impaired cognition with delayed recall, balance deficits, left hemiparesis, difficulty with ADLs secondary to acute left parasagittal pons infarct.  Continue CIR 2.  Antithrombotics: -DVT/anticoagulation:  Pharmaceutical: Lovenox             -antiplatelet therapy:  ASA daily.  3. Pain Management: Tylenol as needed,  Right knee xray mild tricompartmental OA  Gabapentin 100mg  TID started on 1/15 with?  Improvement 4. Mood: LCSW to follow for evaluation and support             -antipsychotic agents: N/A 5. Neuropsych: This patient is?  Fully capable of making decisions on his own behalf. 6. Skin/Wound Care: Routine pressure relief measures.  7. Fluids/Electrolytes/Nutrition: Monitor I/O.   8. HTN: Monitor blood pressures 3 times daily.  Resume Prinivil/hctz as indicated as needed.              Vitals:   12/25/19 0506 12/25/19 0753  BP: 117/78 131/83  Pulse: 78 78  Resp: 18   Temp: 98.4 F (36.9 C)   SpO2: 96%    Controlled on 1/16 9.T2DM with hyperglycemia: Metformin on hold due to elevation in creatinine--monitor blood sugars ac/hs and titrate as indicated.              CBG (last 3)  Recent Labs    12/24/19 2231 12/25/19 0615 12/25/19 1200  GLUCAP 177* 184* 147*   Elevated/stable on 1/16  Metformin 500 daily started on 1/16 10.Dyslipidemia: Pravastatin changed to atorvastatin for aggressive management. 11. LBBB: Follow up with cardiology for [facility] after discharge.     LOS: 3 days A FACE TO FACE EVALUATION WAS PERFORMED  Rich Paprocki Lorie Phenix 12/25/2019, 12:27 PM

## 2019-12-25 NOTE — Progress Notes (Signed)
Physical Therapy Session Note  Patient Details  Name: Eugene Cooper MRN: [id number] Date of Birth: [birth year]  Today's Date: 12/25/2019 PT Individual Time: 1115-1210 PT Individual Time Calculation (min): 55 min   Short Term Goals: Week 1:  PT Short Term Goal 1 (Week 1): Pt will performed bed mobility with min assist PT Short Term Goal 2 (Week 1): Pt will transfer to [facility] with min assist and LRAD consistently PT Short Term Goal 3 (Week 1): Pt will ambulate 88f with mod assist and LRAD PT Short Term Goal 4 (Week 1): Pt will initiate stair management training  Skilled Therapeutic Interventions/Progress Updates:   Pt received supine in bed and agreeable to PT. Supine>sit transfer with min assist and cues for improved use of RLE. [facility] and cues for improved positioning prior to transfer. WC mobility x 1023fwith min-supervision assist with use of hemi technique and moderate cues for improved coordination of LUE/LLE to maintain straight path and avoid obstacles on the R.   PT applied DF wrap to RLEGait training with RW 2 x [id number] mod-min assist from PT for AD management and weight shift over the LLE. Significantly improved step length, height, and knee stability compared to previous session with only 1-2 buckling episodes, which pt was able to self correct without assist from PT. Mild flexed knee posture throughout gait training in BLE. Stand pivot transfer to mat table with RW and min assist.   Sit>supine with min assist for RLE management and improved RUE postioning for safety. briges with 3 second hold 2 x 10 and min assist to stabilize the R hip. SAQ 2 x 10 BLE. PNF dynamic reversal lumbar rotation and trunk rotation to achieve full R and L 3 x 3 min each. BLE clam shells with max assist to prevent compensations through trunk x 10. Sit>supine on the L with min assist and min cues for use of RUE to push across trunk to allow improve LUE positioning. Stand  pivot transfer to WC [facility] RW and min assist.  Patient returned to room and [facility] and all needs met.           Therapy Documentation Precautions:  Precautions Precautions: Fall Restrictions Weight Bearing Restrictions: No Pain : denies   Therapy/Group: Individual Therapy  AusLorie Phenix16/2021, 6:20 PM

## 2019-12-26 DIAGNOSIS — E1165 Type 2 diabetes mellitus with hyperglycemia: Secondary | ICD-10-CM | POA: Diagnosis not present

## 2019-12-26 DIAGNOSIS — I10 Essential (primary) hypertension: Secondary | ICD-10-CM

## 2019-12-26 DIAGNOSIS — M792 Neuralgia and neuritis, unspecified: Secondary | ICD-10-CM | POA: Diagnosis not present

## 2019-12-26 DIAGNOSIS — I69351 Hemiplegia and hemiparesis following cerebral infarction affecting right dominant side: Secondary | ICD-10-CM | POA: Diagnosis not present

## 2019-12-26 DIAGNOSIS — R799 Abnormal finding of blood chemistry, unspecified: Secondary | ICD-10-CM

## 2019-12-26 DIAGNOSIS — I6350 Cerebral infarction due to unspecified occlusion or stenosis of unspecified cerebral artery: Secondary | ICD-10-CM | POA: Diagnosis not present

## 2019-12-26 DIAGNOSIS — R7309 Other abnormal glucose: Secondary | ICD-10-CM | POA: Diagnosis not present

## 2019-12-26 LAB — GLUCOSE, CAPILLARY
Glucose-Capillary: 181 mg/dL — ABNORMAL HIGH (ref 70–99)
Glucose-Capillary: 133 mg/dL — ABNORMAL HIGH (ref 70–99)
Glucose-Capillary: 188 mg/dL — ABNORMAL HIGH (ref 70–99)
Glucose-Capillary: 213 mg/dL — ABNORMAL HIGH (ref 70–99)

## 2019-12-26 NOTE — Progress Notes (Signed)
[facility] PHYSICAL MEDICINE & REHABILITATION PROGRESS NOTE   Subjective/Complaints: Patient seen laying in bed this morning.  He indicates he slept well overnight.  No reported issues overnight.  ROS: Limited due to language, but appears to deny CP, shortness of breath, nausea, vomiting or diarrhea.  Objective:   No results found. No results for input(s): WBC, HGB, HCT, PLT in the last 72 hours. No results for input(s): NA, K, CL, CO2, GLUCOSE, BUN, CREATININE, CALCIUM in the last 72 hours.  Intake/Output Summary (Last 24 hours) at 12/26/2019 2058 Last data filed at 12/26/2019 2010 Gross per 24 hour  Intake 720 ml  Output 1100 ml  Net -380 ml     Physical Exam: Vital Signs Blood pressure 137/79, pulse 88, temperature 99 F (37.2 C), temperature source Oral, resp. rate 16, height 5\' 4"  (1.626 m), weight 72 kg, SpO2 97 %. Constitutional: No distress . Vital signs reviewed. HENT: Normocephalic.  Atraumatic. Eyes: EOMI. No discharge. Cardiovascular: No JVD. Respiratory: Normal effort.  No stridor. GI: Non-distended. Skin: Warm and dry.  Intact. Psych: Normal mood.  Normal behavior. Musc: No edema in extremities.  No tenderness in extremities. Neurologic: Alert Motor: RUE/RLE: >?3/5 proximal to distal  Assessment/Plan: 1. 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 which require 3+ hours per day of interdisciplinary therapy in a comprehensive inpatient rehab setting.  Physiatrist is providing close team supervision and 24 hour management of active medical problems listed below.  Physiatrist and rehab team continue to assess barriers to discharge/monitor patient progress toward functional and medical goals  Care Tool:  Bathing    Body parts bathed by patient: Right arm, Chest, Abdomen, Front perineal area, Right upper leg, Left upper leg, Face   Body parts bathed by helper: Left arm, Buttocks, Right lower leg, Left lower leg     Bathing assist  Assist Level: Moderate Assistance - Patient 50 - 74%     Upper Body Dressing/Undressing Upper body dressing Upper body dressing/undressing activity did not occur (including orthotics): N/A What is the patient wearing?: Pull over shirt    Upper body assist Assist Level: Minimal Assistance - Patient > 75%    Lower Body Dressing/Undressing Lower body dressing      What is the patient wearing?: Pants     Lower body assist Assist for lower body dressing: Maximal Assistance - Patient 25 - 49%     Toileting Toileting Toileting Activity did not occur (Clothing management and hygiene only): Refused  Toileting assist Assist for toileting: Moderate Assistance - Patient 50 - 74% Assistive Device Comment: urinal   Transfers Chair/bed transfer  Transfers assist     Chair/bed transfer assist level: Minimal Assistance - Patient > 75%     Locomotion Ambulation   Ambulation assist   Ambulation activity did not occur: Safety/medical concerns  Assist level: Moderate Assistance - Patient 50 - 74% Assistive device: Walker-rolling Max distance: 75   Walk 10 feet activity   Assist  Walk 10 feet activity did not occur: Safety/medical concerns  Assist level: Moderate Assistance - Patient - 50 - 74% Assistive device: Walker-rolling   Walk 50 feet activity   Assist Walk 50 feet with 2 turns activity did not occur: Safety/medical concerns  Assist level: Moderate Assistance - Patient - 50 - 74% Assistive device: Walker-rolling    Walk 150 feet activity   Assist Walk 150 feet activity did not occur: Safety/medical concerns         Walk 10 feet on  uneven surface  activity   Assist Walk 10 feet on uneven surfaces activity did not occur: Safety/medical concerns         Wheelchair     Assist Will patient use wheelchair at discharge?: Yes Type of Wheelchair: Manual    Wheelchair assist level: Minimal Assistance - Patient > 75% Max wheelchair distance: 100     Wheelchair 50 feet with 2 turns activity    Assist        Assist Level: Minimal Assistance - Patient > 75%   Wheelchair 150 feet activity     Assist      Assist Level: Total Assistance - Patient < 25%   Blood pressure 137/79, pulse 88, temperature 99 F (37.2 C), temperature source Oral, resp. rate 16, height 5\' 4"  (1.626 m), weight 72 kg, SpO2 97 %.  Medical Problem List and Plan: 1.  Impaired cognition with delayed recall, balance deficits, left hemiparesis, difficulty with ADLs secondary to acute left parasagittal pons infarct.  Continue CIR 2.  Antithrombotics: -DVT/anticoagulation:  Pharmaceutical: Lovenox             -antiplatelet therapy:  ASA daily.  3. Pain Management: Tylenol as needed,  Right knee xray mild tricompartmental OA  Gabapentin 100mg  TID started on 1/15 with?  Improvement  Appears controlled on 1/17 4. Mood: LCSW to follow for evaluation and support             -antipsychotic agents: N/A 5. Neuropsych: This patient is?  Fully capable of making decisions on his own behalf. 6. Skin/Wound Care: Routine pressure relief measures.  7. Fluids/Electrolytes/Nutrition: Monitor I/O.   8. HTN: Monitor blood pressures 3 times daily.  Resume Prinivil/hctz as indicated as needed.              Vitals:   12/26/19 1429 12/26/19 1950  BP: 118/77 137/79  Pulse: 77 88  Resp: 18 16  Temp: 97.7 F (36.5 C) 99 F (37.2 C)  SpO2: 98% 97%   Controlled on 1/17 9.T2DM with hyperglycemia: Metformin on hold due to elevation in creatinine--monitor blood sugars ac/hs and titrate as indicated.              CBG (last 3)  Recent Labs    12/26/19 0619 12/26/19 1206 12/26/19 1636  GLUCAP 181* 133* 188*   Elevated/labile on 1/17  Metformin 500 daily started on 1/16  Consider further medication adjustments as necessary 10. Dyslipidemia: Pravastatin changed to atorvastatin for aggressive management. 11. LBBB: Follow up with cardiology for [facility] after discharge.   12.  Elevated BUN/creatinine  Labs ordered for tomorrow    LOS: 4 days A FACE TO FACE EVALUATION WAS PERFORMED  Laveta Gilkey Lorie Phenix 12/26/2019, 8:58 PM

## 2019-12-27 ENCOUNTER — Inpatient Hospital Stay (HOSPITAL_COMMUNITY): Payer: Medicare Other

## 2019-12-27 ENCOUNTER — Inpatient Hospital Stay (HOSPITAL_COMMUNITY): Payer: Medicare Other | Admitting: Physical Therapy

## 2019-12-27 ENCOUNTER — Inpatient Hospital Stay (HOSPITAL_COMMUNITY): Payer: Medicare Other | Admitting: Occupational Therapy

## 2019-12-27 DIAGNOSIS — M25461 Effusion, right knee: Secondary | ICD-10-CM | POA: Diagnosis not present

## 2019-12-27 DIAGNOSIS — I69351 Hemiplegia and hemiparesis following cerebral infarction affecting right dominant side: Secondary | ICD-10-CM | POA: Diagnosis not present

## 2019-12-27 DIAGNOSIS — I6350 Cerebral infarction due to unspecified occlusion or stenosis of unspecified cerebral artery: Secondary | ICD-10-CM | POA: Diagnosis not present

## 2019-12-27 LAB — BASIC METABOLIC PANEL
Anion gap: 12 (ref 5–15)
BUN: 22 mg/dL (ref 8–23)
CO2: 24 mmol/L (ref 22–32)
Calcium: 9.1 mg/dL (ref 8.9–10.3)
Chloride: 102 mmol/L (ref 98–111)
Creatinine, Ser: 1.02 mg/dL (ref 0.61–1.24)
GFR calc Af Amer: >60 mL/min (ref >60)
GFR calc non Af Amer: >60 mL/min (ref >60)
Glucose, Bld: 174 mg/dL — ABNORMAL HIGH (ref 70–99)
Potassium: 4.1 mmol/L (ref 3.5–5.1)
Sodium: 138 mmol/L (ref 135–145)

## 2019-12-27 LAB — CBC
HCT: 38.3 % — ABNORMAL LOW (ref 39.0–52.0)
Hemoglobin: 13.3 g/dL (ref 13.0–17.0)
MCH: 30.4 pg (ref 26.0–34.0)
MCHC: 34.7 g/dL (ref 30.0–36.0)
MCV: 87.6 fL (ref 80.0–100.0)
Platelets: 319 10*3/uL (ref 150–400)
RBC: 4.37 MIL/uL (ref 4.22–5.81)
RDW: 12.6 % (ref 11.5–15.5)
WBC: 8.3 10*3/uL (ref 4.0–10.5)
nRBC: 0 % (ref 0.0–0.2)

## 2019-12-27 LAB — GLUCOSE, CAPILLARY
Glucose-Capillary: 152 mg/dL — ABNORMAL HIGH (ref 70–99)
Glucose-Capillary: 154 mg/dL — ABNORMAL HIGH (ref 70–99)
Glucose-Capillary: 191 mg/dL — ABNORMAL HIGH (ref 70–99)
Glucose-Capillary: 267 mg/dL — ABNORMAL HIGH (ref 70–99)

## 2019-12-27 MED ORDER — BETAMETHASONE SOD PHOS & ACET 6 (3-3) MG/ML IJ SUSP
12.0000 mg | Freq: Once | INTRAMUSCULAR | Status: AC
  Administered 2019-12-27: 12 mg via INTRA_ARTICULAR
  Filled 2019-12-27: qty 2

## 2019-12-27 MED ORDER — METFORMIN HCL 500 MG PO TABS
500.0000 mg | ORAL_TABLET | Freq: Two times a day (BID) | ORAL | Status: DC
  Administered 2019-12-27 – 2020-01-07 (×23): 500 mg via ORAL
  Filled 2019-12-27 (×25): qty 1

## 2019-12-27 NOTE — Procedures (Signed)
Knee arthrocentesis injection RIght   Indication:Right Knee pain and effusion not relieved by medication management and other conservative care.  Informed consent was obtained after describing risks and benefits of the procedure with the patient, this includes bleeding, bruising, infection and medication side effects. The patient wishes to proceed and has given written consent. The patient was placed in a recumbent position. The medial aspect of the knee was marked and prepped with Betadine and alcohol. It was then entered with a 25-gauge 1-1/2 inch needle and 2 mL of .5% marcaine was injected into the skin and subcutaneous tissue. Then another 21g 2"needle was inserted into the knee joint. After removing 31ml of blood tinged but otherwise clear joint fluid a solution containing 2 ML of 6mg  per mL betamethasone and 71mL of .5% marcaine were injected. The patient tolerated the procedure well. Post procedure instructions were given.

## 2019-12-27 NOTE — Progress Notes (Signed)
Occupational Therapy Session Note  Patient Details  Name: Eugene Cooper MRN: [id number] Date of Birth: January 20, 1941  Today's Date: 12/27/2019 OT Individual Time: [id number] OT Individual Time Calculation (min): 44 min    Short Term Goals: Week 1:  OT Short Term Goal 1 (Week 1): Pt will maintain standing balance with min A for LB clothing management. OT Short Term Goal 2 (Week 1): Pt will perform UB dressing with min A overall. OT Short Term Goal 3 (Week 1): Pt will perform toilet transfer with min A overall. OT Short Term Goal 4 (Week 1): Pt will perform bathing at shower level with min A and modifications as needed.  Skilled Therapeutic Interventions/Progress Updates:    Upon entering the room, pt supine in bed with interpreter present as well. Pt reports 5/10 pain in R knee but agreeable to OT intervention. Pt performed squat pivot transfer from bed >wheelchair with min A. Pt verbalizing need for toileting. OT assisted pt to bathroom via wheelchair and pt transferred from wheelchair > toilet with min A and mod A for clothing management. Pt able to perform hygiene while seated. Pt needing increased cuing secondary to impulsivity with mobility/transfers. Pt seated in wheelchair at sink for hand hygiene and brushing teeth for B hand coordination tasks with min cuing for proper technique. Pt shaving face with min A for thoroughness. Pt engaged R UE NMR with AROM/AAROM for pipetree with therapist assist to decrease compensatory techniques. Pt with multiple drops from R hand and unable to manipulate in palm without drops. Pt with very gross grasp as well. Pt returned to room via wheelchair with chair alarm belt donned and call bell within reach.   Therapy Documentation Precautions:  Precautions Precautions: Fall Restrictions Weight Bearing Restrictions: No Pain: Pain Assessment Pain Scale: Faces Faces Pain Scale: No hurt Pain Type: Chronic pain Pain Location: Knee Pain Orientation:  Right Pain Descriptors / Indicators: Aching Pain Onset: On-going Patients Stated Pain Goal: 3 Pain Intervention(s): Medication (See eMAR)   Therapy/Group: Individual Therapy  Gypsy Decant 12/27/2019, 12:28 PM

## 2019-12-27 NOTE — Progress Notes (Signed)
Physical Therapy Session Note  Patient Details  Name: Eugene Cooper MRN: [id number] Date of Birth: October 01, 1941  Today's Date: 12/27/2019 PT Individual Time: 1420-1520 PT Individual Time Calculation (min): 60 min   Short Term Goals: Week 1:  PT Short Term Goal 1 (Week 1): Pt will performed bed mobility with min assist PT Short Term Goal 2 (Week 1): Pt will transfer to [facility] with min assist and LRAD consistently PT Short Term Goal 3 (Week 1): Pt will ambulate 20ft with mod assist and LRAD PT Short Term Goal 4 (Week 1): Pt will initiate stair management training  Skilled Therapeutic Interventions/Progress Updates:   Medical interpreter present. Pt stated that he needed to urinate.  He declined urinal, wanting to go to toilet.  Supine> sit to R with min assist.  Pt c/o dizziness upon sitting up; resolved in approx 1 minute. Sit> stand and pivot with RW bed> w/c wit min/mod assist.  Toilet transfer with min assit, pt using wall bar.  Continent of urine.  Pt needed assist on R side for clothing mgt.  Hand washing at sink with visual cues.  neuromuscular re-education via forced use, multimodal cues for use of NuStep at level 4, x 3 minutes, using bil UEs and bil LEs for alternating reciprocal movements.  Pt self corrected with R hand lost grip on the bar, re-positioning it independently throughout ex.  He needed cues for R hip control for neutral rotation.  Sit> stand with min assist and max cues for hand placement, RW.  Gait x 25' , RW, with mod/max assist due to LOB due to poor clearance of R foot.  PT donned ACE RLE for foot drop.  Therapeutic activity, in standing ACE on RLE, with L UE support on wall railing, kicking soccer ball over 3' distance with R foot.  With practice, pt demonstrated excellent clearance and power.    Gait x 30' with RW on level tile, ACE RLE, with mod assist and improved clearance of R foot.   At end of session, pt seated in w/c with seat belt alarm set and needs  at hand.  When asked how he would call for help, pt pointed to nurse button on call bell.      Therapy Documentation Precautions:  Precautions Precautions: Fall Restrictions Weight Bearing Restrictions: No   Pain: " a little bit" R knee; Dr. Letta Pate just aspirated fluid and injected cortisone into knee.  No new orders.          Therapy/Group: Individual Therapy  Nyima Vanacker 12/27/2019, 4:27 PM

## 2019-12-27 NOTE — Plan of Care (Signed)
  Problem: Consults Goal: RH STROKE PATIENT EDUCATION Description: See Patient Education module for education specifics  Outcome: Progressing   Problem: RH BOWEL ELIMINATION Goal: RH STG MANAGE BOWEL W/MEDICATION W/ASSISTANCE Description: STG Manage Bowel with Medication with Mod IAssistance. Outcome: Progressing   Problem: RH SAFETY Goal: RH STG ADHERE TO SAFETY PRECAUTIONS W/ASSISTANCE/DEVICE Description: STG Adhere to Safety Precautions With Mod IAssistance/Device. Outcome: Progressing Goal: RH STG DECREASED RISK OF FALL WITH ASSISTANCE Description: STG Decreased Risk of Fall With Mod I Assistance. Outcome: Progressing

## 2019-12-27 NOTE — Progress Notes (Signed)
Physical Therapy Session Note  Patient Details  Name: Eugene Cooper MRN: [id number] Date of Birth: [birth year]  Today's Date: 12/27/2019 PT Individual Time: 0900-1000 PT Individual Time Calculation (min): 60 min   Short Term Goals: Week 1:  PT Short Term Goal 1 (Week 1): Pt will performed bed mobility with min assist PT Short Term Goal 2 (Week 1): Pt will transfer to [facility] with min assist and LRAD consistently PT Short Term Goal 3 (Week 1): Pt will ambulate 34f with mod assist and LRAD PT Short Term Goal 4 (Week 1): Pt will initiate stair management training  Skilled Therapeutic Interventions/Progress Updates: Pt presented in recliner agreeable to therapy. Interpreter present throughout session. Pt stating mild pain in R knee, states MD to provide injection to knee for pain management. Pt noted to not have pants on, threaded pants total A for time management and pt performed STS from recliner minA with PTA providing totalA to pull pants over hips. Performed stand pivot to w/c modA with verbal cues for engaging RLE, sequencing, and safety with RW during turn. Pt propelled w/c via hemi technique minA to rehab gym and performed stand pivot to mat minA with improved technique. Pt participated in STS x 5 without AD but with mirror feedback for improved wt shifting to R. Pt then performed with LLE on 2in step for increased forced activation of RLE requiring minA with max verbal cues to not brace LLE against mat. Pt also performed toe taps to 2in step x 10 bilaterally and wife tactile cues for maintaining R knee extension when tapping with LLE. Also performed alternating toe taps for wt shifting and forced use of RLE. Participated in x 2 bouts of horseshoes without AD requiring minA to maintain balance. Pt was able to reach and grasp horseshoe however difficulty releasing item. Towards end of session pt indicating increased pain in R knee from 3 to 5/10, notified nsg. Pt then performed stand pivot back to  w/c and transported to room. Performed stand pivot to bed minA and performed sit to supine with minA for RLE management. Pt repositioned self to comfort and left with bed alarm on, call bell within reach and needs met.      Therapy Documentation Precautions:  Precautions Precautions: Fall Restrictions Weight Bearing Restrictions: No General:   Vital Signs:     Therapy/Group: Individual Therapy  Cheril Slattery  Jason Hauge, PTA  12/27/2019, 1:50 PM

## 2019-12-27 NOTE — Progress Notes (Signed)
PHYSICAL MEDICINE & REHABILITATION PROGRESS NOTE   Subjective/Complaints:  Ipad interpreter used C/o R knee pain and swelling, had similar issues in Left knee about 3 months ago which improved after an injection  No fall or trauma  No hx of R knee surgery   ROS: Limited due to language, but appears to deny CP, shortness of breath, nausea, vomiting or diarrhea.  Objective:   No results found. Recent Labs    12/27/19 0549  WBC 8.3  HGB 13.3  HCT 38.3*  PLT 319   Recent Labs    12/27/19 0549  NA 138  K 4.1  CL 102  CO2 24  GLUCOSE 174*  BUN 22  CREATININE 1.02  CALCIUM 9.1    Intake/Output Summary (Last 24 hours) at 12/27/2019 0755 Last data filed at 12/26/2019 2145 Gross per 24 hour  Intake 720 ml  Output 600 ml  Net 120 ml     Physical Exam: Vital Signs Blood pressure 112/78, pulse 70, temperature 98.6 F (37 C), temperature source Oral, resp. rate 16, height 5\' 4"  (1.626 m), weight 72 kg, SpO2 96 %. Constitutional: No distress . Vital signs reviewed. HENT: Normocephalic.  Atraumatic. Eyes: EOMI. No discharge. Cardiovascular: No JVD. Respiratory: Normal effort.  No stridor. GI: Non-distended. Skin: Warm and dry.  Intact. Psych: Normal mood.  Normal behavior. Musc: Right knee effusion on erythema, no skin lesions, pain with ROM   No tenderness in extremities. Neurologic: Alert Motor: RUE/RLE: 3/5 proximal to distal, Left side 5/5  Assessment/Plan: 1. 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 which require 3+ hours per day of interdisciplinary therapy in a comprehensive inpatient rehab setting. Physiatrist is providing close team supervision and 24 hour management of active medical problems listed below. Physiatrist and rehab team continue to assess barriers to discharge/monitor patient progress toward functional and medical goals  Care Tool:  Bathing    Body parts bathed by patient: Right arm, Chest, Abdomen, Front  perineal area, Right upper leg, Left upper leg, Face   Body parts bathed by helper: Left arm, Buttocks, Right lower leg, Left lower leg     Bathing assist Assist Level: Moderate Assistance - Patient 50 - 74%     Upper Body Dressing/Undressing Upper body dressing Upper body dressing/undressing activity did not occur (including orthotics): N/A What is the patient wearing?: Pull over shirt    Upper body assist Assist Level: Minimal Assistance - Patient > 75%    Lower Body Dressing/Undressing Lower body dressing      What is the patient wearing?: Pants     Lower body assist Assist for lower body dressing: Maximal Assistance - Patient 25 - 49%     Toileting Toileting Toileting Activity did not occur (Clothing management and hygiene only): Refused  Toileting assist Assist for toileting: Moderate Assistance - Patient 50 - 74% Assistive Device Comment: urinal   Transfers Chair/bed transfer  Transfers assist     Chair/bed transfer assist level: Minimal Assistance - Patient > 75%     Locomotion Ambulation   Ambulation assist   Ambulation activity did not occur: Safety/medical concerns  Assist level: Moderate Assistance - Patient 50 - 74% Assistive device: Walker-rolling Max distance: 75   Walk 10 feet activity   Assist  Walk 10 feet activity did not occur: Safety/medical concerns  Assist level: Moderate Assistance - Patient - 50 - 74% Assistive device: Walker-rolling   Walk 50 feet activity   Assist Walk 50 feet with 2  turns activity did not occur: Safety/medical concerns  Assist level: Moderate Assistance - Patient - 50 - 74% Assistive device: Walker-rolling    Walk 150 feet activity   Assist Walk 150 feet activity did not occur: Safety/medical concerns         Walk 10 feet on uneven surface  activity   Assist Walk 10 feet on uneven surfaces activity did not occur: Safety/medical concerns         Wheelchair     Assist Will patient  use wheelchair at discharge?: Yes Type of Wheelchair: Manual    Wheelchair assist level: Minimal Assistance - Patient > 75% Max wheelchair distance: 100    Wheelchair 50 feet with 2 turns activity    Assist        Assist Level: Minimal Assistance - Patient > 75%   Wheelchair 150 feet activity     Assist      Assist Level: Total Assistance - Patient < 25%   Blood pressure 112/78, pulse 70, temperature 98.6 F (37 C), temperature source Oral, resp. rate 16, height 5\' 4"  (1.626 m), weight 72 kg, SpO2 96 %.  Medical Problem List and Plan: 1.  Impaired cognition with delayed recall, balance deficits, left hemiparesis, difficulty with ADLs secondary to acute left parasagittal pons infarct.  Continue CIR 2.  Antithrombotics: -DVT/anticoagulation:  Pharmaceutical: Lovenox             -antiplatelet therapy:  ASA daily.  3. Pain Management: Tylenol as needed,  Right knee xray mild tricompartmental OA  Gabapentin 100mg  TID started on 1/15 with?  Improvement  RIght knee joint pain mild OA, monarticular arthritis check urate may need arthrocentesis and injection , normal WBC , afeb doubt septic jt 4. Mood: LCSW to follow for evaluation and support             -antipsychotic agents: N/A 5. Neuropsych: This patient is?  Fully capable of making decisions on his own behalf. 6. Skin/Wound Care: Routine pressure relief measures.  7. Fluids/Electrolytes/Nutrition: Monitor I/O.   8. HTN: Monitor blood pressures 3 times daily.  Resume Prinivil/hctz as indicated as needed.              Vitals:   12/26/19 1950 12/27/19 0500  BP: 137/79 112/78  Pulse: 88 70  Resp: 16 16  Temp: 99 F (37.2 C) 98.6 F (37 C)  SpO2: 97% 96%   Controlled on 1/18 9.T2DM with hyperglycemia: Metformin on hold due to elevation in creatinine--monitor blood sugars ac/hs and titrate as indicated.              CBG (last 3)  Recent Labs    12/26/19 1636 12/26/19 2117 12/27/19 0606  GLUCAP 188* 213* 152*    Elevated/labile on 1/18  Metformin 500 daily started on 1/16  Will increase to BID  10. Dyslipidemia: Pravastatin changed to atorvastatin for aggressive management. 11. LBBB: Follow up with cardiology for [facility] after discharge.  12.  Elevated BUN/creatinine  Repeat normal     LOS: 5 days A FACE TO FACE EVALUATION WAS PERFORMED  Charlett Blake 12/27/2019, 7:55 AM

## 2019-12-28 ENCOUNTER — Inpatient Hospital Stay (HOSPITAL_COMMUNITY): Payer: Medicare Other | Admitting: Occupational Therapy

## 2019-12-28 ENCOUNTER — Inpatient Hospital Stay (HOSPITAL_COMMUNITY): Payer: Medicare Other

## 2019-12-28 ENCOUNTER — Inpatient Hospital Stay (HOSPITAL_COMMUNITY): Payer: Medicare Other | Admitting: Physical Therapy

## 2019-12-28 DIAGNOSIS — I6350 Cerebral infarction due to unspecified occlusion or stenosis of unspecified cerebral artery: Secondary | ICD-10-CM | POA: Diagnosis not present

## 2019-12-28 DIAGNOSIS — I69351 Hemiplegia and hemiparesis following cerebral infarction affecting right dominant side: Secondary | ICD-10-CM | POA: Diagnosis not present

## 2019-12-28 LAB — GLUCOSE, CAPILLARY
Glucose-Capillary: 249 mg/dL — ABNORMAL HIGH (ref 70–99)
Glucose-Capillary: 219 mg/dL — ABNORMAL HIGH (ref 70–99)
Glucose-Capillary: 252 mg/dL — ABNORMAL HIGH (ref 70–99)

## 2019-12-28 NOTE — Progress Notes (Signed)
Physical Therapy Session Note  Patient Details  Name: Eugene Cooper MRN: [id number] Date of Birth: 12-31-1940  Today's Date: 12/28/2019 PT Individual Time: 0907-1000 PT Individual Time Calculation (min): 53 min   Short Term Goals: Week 1:  PT Short Term Goal 1 (Week 1): Pt will performed bed mobility with min assist PT Short Term Goal 2 (Week 1): Pt will transfer to [facility] with min assist and LRAD consistently PT Short Term Goal 3 (Week 1): Pt will ambulate 80ft with mod assist and LRAD PT Short Term Goal 4 (Week 1): Pt will initiate stair management training  Skilled Therapeutic Interventions/Progress Updates: Pt presented in bed agreeable to therapy. Pt stating small HA but no intervention required. Pt performed supine to sit with CGA and use of bed features. PTA donned shoes total A for time ,management. Pt ambulated to bathroom minA with fair clearance of RLE but mod cues for safety with RW (+void). PT then ambulated to sink minA and performed hand hygiene in standing. Pt then transported to rehab gym for energy conservation. Performed stand pivot transfer to mat minA. Participated in R NMR via forced use with emphasis on hamstring activation. Performed hamstring pulls with level 3 resistance band 2 x 10. Transferred to supine and performed bridges 2 x 10, hamstring pulls with red physio ball 2 x10, modified single leg bridge off bolster 2 x5. Pt required min to mod cues for technique and verbal cues to maintain R neutral hip alignment.  Performed supine to sit rolling to R on flat surface with CGA and increased time with cues for placement of LUE and to push through with LLE to assist in uprighting trunk.  Pt then participated in step overs with RLE only 2 x 10 with overall improved coordination and placement of LLE. After therapeutic rest pt ambulated approx 44ft with minA demonstrating improved R foot clearance with verbal cues for increased strike. Pt transported remaining distance to nsg  station and left at station due to maintenance currently working in pt's rooms.      Therapy Documentation Precautions:  Precautions Precautions: Fall Restrictions Weight Bearing Restrictions: No General:   Vital Signs: Therapy Vitals Temp: 98.3 F (36.8 C) Pulse Rate: 89 Resp: 16 BP: 127/67 Patient Position (if appropriate): Lying Oxygen Therapy SpO2: 96 % O2 Device: Room Air    Therapy/Group: Individual Therapy  Agastya Meister  Jodie Cavey, PTA  12/28/2019, 4:46 PM

## 2019-12-28 NOTE — Progress Notes (Signed)
[place] PHYSICAL MEDICINE & REHABILITATION PROGRESS NOTE   Subjective/Complaints:  Ipad interpreter used RIght knee pain much improved after aspiration and injection   ROS: Limited due to language, but appears to deny CP, shortness of breath, nausea, vomiting or diarrhea.  Objective:   No results found. Recent Labs    12/27/19 0549  WBC 8.3  HGB 13.3  HCT 38.3*  PLT 319   Recent Labs    12/27/19 0549  NA 138  K 4.1  CL 102  CO2 24  GLUCOSE 174*  BUN 22  CREATININE 1.02  CALCIUM 9.1    Intake/Output Summary (Last 24 hours) at 12/28/2019 0758 Last data filed at 12/28/2019 [id number] Gross per 24 hour  Intake 410 ml  Output 550 ml  Net -140 ml     Physical Exam: Vital Signs Blood pressure (!) 158/94, pulse 95, temperature 98.2 F (36.8 C), resp. rate 18, height 5\' 4"  (1.626 m), weight 72 kg, SpO2 97 %. Constitutional: No distress . Vital signs reviewed. HENT: Normocephalic.  Atraumatic. Eyes: EOMI. No discharge. Cardiovascular: No JVD. Respiratory: Normal effort.  No stridor. GI: Non-distended. Skin: Warm and dry.  Intact. Psych: Normal mood.  Normal behavior. Musc: Right knee effusion on erythema, no skin lesions, pain with ROM   No tenderness in extremities. Neurologic: Alert Motor: RUE/RLE: 3/5 proximal to distal, Left side 5/5  Assessment/Plan: 1. 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 which require 3+ hours per day of interdisciplinary therapy in a comprehensive inpatient rehab setting.  Physiatrist is providing close team supervision and 24 hour management of active medical problems listed below.  Physiatrist and rehab team continue to assess barriers to discharge/monitor patient progress toward functional and medical goals  Care Tool:  Bathing    Body parts bathed by patient: Right arm, Chest, Abdomen, Front perineal area, Right upper leg, Left upper leg, Face   Body parts bathed by helper: Left arm, Buttocks, Right lower  leg, Left lower leg     Bathing assist Assist Level: Moderate Assistance - Patient 50 - 74%     Upper Body Dressing/Undressing Upper body dressing Upper body dressing/undressing activity did not occur (including orthotics): N/A What is the patient wearing?: Pull over shirt    Upper body assist Assist Level: Minimal Assistance - Patient > 75%    Lower Body Dressing/Undressing Lower body dressing      What is the patient wearing?: Pants     Lower body assist Assist for lower body dressing: Maximal Assistance - Patient 25 - 49%     Toileting Toileting Toileting Activity did not occur (Clothing management and hygiene only): Refused  Toileting assist Assist for toileting: Moderate Assistance - Patient 50 - 74% Assistive Device Comment: urinal   Transfers Chair/bed transfer  Transfers assist     Chair/bed transfer assist level: Moderate Assistance - Patient 50 - 74%     Locomotion Ambulation   Ambulation assist   Ambulation activity did not occur: Safety/medical concerns  Assist level: Moderate Assistance - Patient 50 - 74% Assistive device: Walker-rolling Max distance: 30   Walk 10 feet activity   Assist  Walk 10 feet activity did not occur: Safety/medical concerns  Assist level: Moderate Assistance - Patient - 50 - 74% Assistive device: Walker-rolling, Orthosis   Walk 50 feet activity   Assist Walk 50 feet with 2 turns activity did not occur: Safety/medical concerns  Assist level: Moderate Assistance - Patient - 50 - 74% Assistive device: Walker-rolling  Walk 150 feet activity   Assist Walk 150 feet activity did not occur: Safety/medical concerns         Walk 10 feet on uneven surface  activity   Assist Walk 10 feet on uneven surfaces activity did not occur: Safety/medical concerns         Wheelchair     Assist Will patient use wheelchair at discharge?: Yes Type of Wheelchair: Manual    Wheelchair assist level: Minimal  Assistance - Patient > 75% Max wheelchair distance: 100    Wheelchair 50 feet with 2 turns activity    Assist        Assist Level: Minimal Assistance - Patient > 75%   Wheelchair 150 feet activity     Assist      Assist Level: Total Assistance - Patient < 25%   Blood pressure (!) 158/94, pulse 95, temperature 98.2 F (36.8 C), resp. rate 18, height 5\' 4"  (1.626 m), weight 72 kg, SpO2 97 %.  Medical Problem List and Plan: 1.  Impaired cognition with delayed recall, balance deficits, left hemiparesis, difficulty with ADLs secondary to acute left parasagittal pons infarct.  Continue CIR team conf in am  2.  Antithrombotics: -DVT/anticoagulation:  Pharmaceutical: Lovenox             -antiplatelet therapy:  ASA daily.  3. Pain Management: Tylenol as needed,  Right knee xray mild tricompartmental OA  Gabapentin 100mg  TID started on 1/15 with?  Improvement  RIght knee joint pain mild OA, monarticular arthritis improved after asp and injection  4. Mood: LCSW to follow for evaluation and support             -antipsychotic agents: N/A 5. Neuropsych: This patient is?  Fully capable of making decisions on his own behalf. 6. Skin/Wound Care: Routine pressure relief measures.  7. Fluids/Electrolytes/Nutrition: Monitor I/O.   8. HTN: Monitor blood pressures 3 times daily.  Resume Prinivil/hctz as indicated as needed.              Vitals:   12/27/19 2203 12/28/19 0353  BP: 127/79 (!) 158/94  Pulse: 83 95  Resp: 18 18  Temp: 98.2 F (36.8 C) 98.2 F (36.8 C)  SpO2: 93% 97%   Controlled on 1/19 9.T2DM with hyperglycemia: Metformin on hold due to elevation in creatinine--monitor blood sugars ac/hs and titrate as indicated.              CBG (last 3)  Recent Labs    12/27/19 1655 12/27/19 2036 12/28/19 0623  GLUCAP 191* 267* 249*   Elevated/labile on 1/18  Metformin 500 daily started on 1/16  Will increase to BID - will be elevated for 2-3 d following celestone injection   10. Dyslipidemia: Pravastatin changed to atorvastatin for aggressive management. 11. LBBB: Follow up with cardiology for [facility] after discharge.      LOS: 6 days A FACE TO FACE EVALUATION WAS PERFORMED  Charlett Blake 12/28/2019, 7:58 AM

## 2019-12-28 NOTE — Plan of Care (Signed)
  Problem: Consults Goal: RH STROKE PATIENT EDUCATION Description: See Patient Education module for education specifics  Outcome: Progressing   Problem: RH BOWEL ELIMINATION Goal: RH STG MANAGE BOWEL W/MEDICATION W/ASSISTANCE Description: STG Manage Bowel with Medication with Mod IAssistance. Outcome: Progressing   Problem: RH SAFETY Goal: RH STG ADHERE TO SAFETY PRECAUTIONS W/ASSISTANCE/DEVICE Description: STG Adhere to Safety Precautions With Mod IAssistance/Device. Outcome: Progressing Goal: RH STG DECREASED RISK OF FALL WITH ASSISTANCE Description: STG Decreased Risk of Fall With Mod I Assistance. Outcome: Progressing

## 2019-12-28 NOTE — Progress Notes (Signed)
Occupational Therapy Session Note  Patient Details  Name: Eugene Cooper MRN: [id number] Date of Birth: 07-29-1941  Today's Date: 12/28/2019 OT Individual Time: 1100-1202 OT Individual Time Calculation (min): 62 min    Short Term Goals: Week 1:  OT Short Term Goal 1 (Week 1): Pt will maintain standing balance with min A for LB clothing management. OT Short Term Goal 2 (Week 1): Pt will perform UB dressing with min A overall. OT Short Term Goal 3 (Week 1): Pt will perform toilet transfer with min A overall. OT Short Term Goal 4 (Week 1): Pt will perform bathing at shower level with min A and modifications as needed.  Skilled Therapeutic Interventions/Progress Updates: h   Upon entering the room, pt supine in bed with no c/o pain. When asked about R knee pain he reports, "much better" per interpreter. Pt performed stand pivot transfer from bed >wheelchair with mod A. OT assisted pt into wheelchair and to bathroom for toileting needs. Min A stand pivot transfer onto toilet with use of grab bar. Pt required mod A for standing balance with LB clothing management. He stood and took several steps to TTB in shower with mod A to clear R foot. Pt remained on TTB while bathing and integrated R UE into bathing tasks on his own and with no drops of cloth. Pt standing once to wash buttocks and peri area with min A for balance. Assistance needed to wash R lower leg and foot. Pt returning to wheelchair and seated at sink for grooming tasks with close supervision and min cuing for technique with B hand coordination. Pt donning pull over shirt with set up A and mod cuing for hemiplegic technique. Pt needing assistance to thread R LE into pant leg as he is unable to utilize figure four position. Pt able to tie shoes placed in lap with B hands and increased time. Pt able to don L sock and shoe but needed assistance with R. Pt remained in wheelchair at end of session. Chair alarm belt donned and call bell within  reach upon exiting the room.   Therapy Documentation Precautions:  Precautions Precautions: Fall Restrictions Weight Bearing Restrictions: No   Therapy/Group: Individual Therapy  Gypsy Decant 12/28/2019, 12:37 PM

## 2019-12-28 NOTE — Progress Notes (Signed)
Physical Therapy Session Note  Patient Details  Name: Eugene Cooper MRN: [id number] Date of Birth: Dec 09, 1941  Today's Date: 12/28/2019 PT Individual Time: 1400-1500 PT Individual Time Calculation (min): 60 min   Short Term Goals: Week 1:  PT Short Term Goal 1 (Week 1): Pt will performed bed mobility with min assist PT Short Term Goal 2 (Week 1): Pt will transfer to [facility] with min assist and LRAD consistently PT Short Term Goal 3 (Week 1): Pt will ambulate 50ft with mod assist and LRAD PT Short Term Goal 4 (Week 1): Pt will initiate stair management training  Skilled Therapeutic Interventions/Progress Updates:    Bed mobility with min assist for supine to sit going to the R for trunk support. Donned shoes for time management with PT assist. Min assist for sit to stand with RW and min/mod assist for gait into and out of bathroom with RW due to occasional LOB's requiring mod assist to recover due to poor R foot clearance. Pt performed toilet transfer and standing balance for clothing management and hygiene with min assist. Min assist for dynamic standing balance at sink for hand hygiene. Gait training with RW x 120' with min to mod assist due to LOB with cues for increased R heel strike/foot clearance, positioning of RW, and upright posture to decrease forward lean.   Lite Gait for NMR during gait training with focus on R heel strike and foot clearance, increased stance time on R and repetition. Utilized dorsiflexion wrap on RLE.   Trial 1: forward  0.5 mph; 125'; 3:09 min Trial 2: forward  0.7 mph; 353'; 6 min Trial 3: backward with focus on hip extension, knee control and increased stance time on R  0.4 mph; 153'; 5 min  Pt demonstrates improved ability to manage RLE without physical assist as progressed trials forward.  End of session returned back to bed with min assist without AD and positioned in supine with supervision.    Medical interpreter present during session.   Therapy  Documentation Precautions:  Precautions Precautions: Fall Restrictions Weight Bearing Restrictions: No  Pain:  Denies pain.    Therapy/Group: Individual Therapy  Canary Brim Ivory Broad, PT, DPT, CBIS  12/28/2019, 3:23 PM

## 2019-12-29 ENCOUNTER — Inpatient Hospital Stay (HOSPITAL_COMMUNITY): Payer: Medicare Other | Admitting: Physical Therapy

## 2019-12-29 ENCOUNTER — Inpatient Hospital Stay (HOSPITAL_COMMUNITY): Payer: Medicare Other | Admitting: Occupational Therapy

## 2019-12-29 DIAGNOSIS — I69351 Hemiplegia and hemiparesis following cerebral infarction affecting right dominant side: Secondary | ICD-10-CM | POA: Diagnosis not present

## 2019-12-29 DIAGNOSIS — I6350 Cerebral infarction due to unspecified occlusion or stenosis of unspecified cerebral artery: Secondary | ICD-10-CM | POA: Diagnosis not present

## 2019-12-29 LAB — GLUCOSE, CAPILLARY
Glucose-Capillary: 237 mg/dL — ABNORMAL HIGH (ref 70–99)
Glucose-Capillary: 217 mg/dL — ABNORMAL HIGH (ref 70–99)
Glucose-Capillary: 143 mg/dL — ABNORMAL HIGH (ref 70–99)
Glucose-Capillary: 144 mg/dL — ABNORMAL HIGH (ref 70–99)
Glucose-Capillary: 186 mg/dL — ABNORMAL HIGH (ref 70–99)

## 2019-12-29 NOTE — Progress Notes (Signed)
Occupational Therapy Session Note  Patient Details  Name: Eugene Cooper MRN: [id number] Date of Birth: 05/11/1941  Today's Date: 12/29/2019 OT Individual Time: 1100-1200 OT Individual Time Calculation (min): 60 min    Short Term Goals: Week 1:  OT Short Term Goal 1 (Week 1): Pt will maintain standing balance with min A for LB clothing management. OT Short Term Goal 2 (Week 1): Pt will perform UB dressing with min A overall. OT Short Term Goal 3 (Week 1): Pt will perform toilet transfer with min A overall. OT Short Term Goal 4 (Week 1): Pt will perform bathing at shower level with min A and modifications as needed.  Skilled Therapeutic Interventions/Progress Updates:    Upon entering the room, pt seated on EOB with interpreter present. Pt continues to be impulsive this session with sit <>stands and transfers. Pt standing with min A and ambulating to bathroom with RW and min - mod A. Pt needing mod cuing to stay within RW for safety. Pt managing clothing and hygiene with min A stand balance this session. Pt ambulating to sink for hand hygiene in same manner and needing min A for standing balance at sink. Pt returning to wheelchair and assisted into tub room for time management. OT demonstrated transfer with use of RW onto TTB and pt returning with min A for balance with RW and min A for safety. Pt having difficulty getting R LE into/out of tub ledge but able to do so with min A balance. Pt exiting the bathroom and returning back to wheelchair. Pt assisted to gym for table top activity to address NMR in R hand. Pt given yellow resistive theraputty for strengthening and coordination exercises with use of R hand. Pt needing min cuing for proper technique and pt needing frequent rest breaks secondary to hand fatigue. Pt returning to room and remaining in wheelchair with call bell and all needed items within reach. Chair alarm belt donned for safety.   Therapy Documentation Precautions:   Precautions Precautions: Fall Restrictions Weight Bearing Restrictions: No   Pain: Pain Assessment Pain Scale: Faces Pain Score: 1    Therapy/Group: Individual Therapy  Gypsy Decant 12/29/2019, 12:48 PM

## 2019-12-29 NOTE — Progress Notes (Signed)
Physical Therapy Session Note  Patient Details  Name: Eugene Cooper MRN: [id number] Date of Birth: 11/30/1941  Today's Date: 12/29/2019 PT Individual Time: 0900-0958 AND 1405-1500 PT Individual Time Calculation (min): 58 min and 58mn  Short Term Goals: Week 1:  PT Short Term Goal 1 (Week 1): Pt will performed bed mobility with min assist PT Short Term Goal 2 (Week 1): [facility] consistently PT Short Term Goal 3 (Week 1): Pt will ambulate 339fwith mod assist and LRAD PT Short Term Goal 4 (Week 1): Pt will initiate stair management training  Skilled Therapeutic Interventions/Progress Updates:  Session 1.  Pt received supine in bed and agreeable to PT. Supine>sit transfer with supervision assist and min cues for decreased use of rails and sequencing of the RUE. Stand pivot [facility]. [facility] need for urination. Ambulatory transfer to toilet with min assist and moderate cues for AD management for safety in turn to the sit. Clothing management with CGA from PT to maintain balance.   WC mobility training with Min assist using BUE to force use of RUE 2x [id number] cues for safety and improved attention to the R side.    PT instructed pt in dynamic balance training to perform foot tap on 2 inch step. 2 x 10 BLE with moderate cues for decreased speed and improved activation of R hip musculature and improved posture.   Dynamic gait training with RW forward/reverse 3x 15 with min assist. Side stepping R and L with RW 3 x 15 ft bil. Cues throughout for improved posture, hip flexion, heel contact on the R, as improve glute activation in stance on the R. Pt performed gait training with RW x [id number] min assist for 40ft64fding to mod assist with poor foot clearance on the R as well as poor trunk control.   [facility] returned to room and [facility] reach and all needs met.     Session 2.   Pt received  sitting in WC and agreeable to PT. Pt transported to day room. Stand pivot transfer to Nustep with RW and CGA. Reciprocal movement training BLE and BUE with no UE of thigh support. Completed level 4>5 x 10min61m with only min cues for improved hip control and attention to the RUE.   Dynamic standing balance with 1 UE support to perform lateral reach to the R to improve lateral weight shifting, and overhead toss to corn hole board 8 x 3 bouts. Min assist overall for safety, no knee instability noted with significant improvement of weight shifts over the LLE.   Gait training with RW 3 x 40ft +55f wi6fin assist overall for safety. Moderate multimodal cues to improve hip/knee flexion in the RLE to reduce foot drag, improved posture, and improve postioning in RW to prevent trunk flexion.   Stair training 2 x 4 steps with BUE support and min assist from PT using step to gait pattern; min cues for improved hip/knee flexion and sequencing with descent. Requesting transport to toilet for urination. Stand pivot transfer to toilet with rail and min assist.    Pt returned to room and performed transfer to bed with min assist and no AD. Sit>supine completed with supervision assist and left supine in bed with call bell in reach and all needs met.        Therapy Documentation Precautions:  Precautions Precautions: Fall Restrictions Weight  Bearing Restrictions: No Pain: denies   Therapy/Group: Individual Therapy  Lorie Phenix 12/29/2019, 10:12 AM

## 2019-12-29 NOTE — Progress Notes (Signed)
[place] PHYSICAL MEDICINE & REHABILITATION PROGRESS NOTE   Subjective/Complaints:  In person interpreter used Seen in PT.  Practicing stepping up on 3" step RIght knee pain much improved after aspiration and injection   ROS: Limited due to language, but appears to deny CP, shortness of breath, nausea, vomiting or diarrhea.  Objective:   No results found. Recent Labs    12/27/19 0549  WBC 8.3  HGB 13.3  HCT 38.3*  PLT 319   Recent Labs    12/27/19 0549  NA 138  K 4.1  CL 102  CO2 24  GLUCOSE 174*  BUN 22  CREATININE 1.02  CALCIUM 9.1    Intake/Output Summary (Last 24 hours) at 12/29/2019 0933 Last data filed at 12/29/2019 0745 Gross per 24 hour  Intake 720 ml  Output 1000 ml  Net -280 ml     Physical Exam: Vital Signs Blood pressure 131/89, pulse (!) 57, temperature 98.2 F (36.8 C), resp. rate 18, height 5' 4"  (1.626 m), weight 72 kg, SpO2 99 %. Constitutional: No distress . Vital signs reviewed. HENT: Normocephalic.  Atraumatic. Eyes: EOMI. No discharge. Cardiovascular: No JVD. Respiratory: Normal effort.  No stridor. GI: Non-distended. Skin: Warm and dry.  Intact. Psych: Normal mood.  Normal behavior. Musc: Right knee effusion on erythema, no skin lesions, pain with ROM   No tenderness in extremities. Neurologic: Alert Motor: RUE/RLE: 3/5 proximal to distal, Left side 5/5  Assessment/Plan: 1. 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 which require 3+ hours per day of interdisciplinary therapy in a comprehensive inpatient rehab setting.  Physiatrist is providing close team supervision and 24 hour management of active medical problems listed below.  Physiatrist and rehab team continue to assess barriers to discharge/monitor patient progress toward functional and medical goals  Care Tool:  Bathing    Body parts bathed by patient: Right arm, Chest, Abdomen, Front perineal area, Right upper leg, Left upper leg, Face, Left  arm, Buttocks, Left lower leg   Body parts bathed by helper: Left arm, Buttocks, Right lower leg, Left lower leg Body parts n/a: Right lower leg   Bathing assist Assist Level: Minimal Assistance - Patient > 75%     Upper Body Dressing/Undressing Upper body dressing Upper body dressing/undressing activity did not occur (including orthotics): N/A What is the patient wearing?: Pull over shirt    Upper body assist Assist Level: Set up assist    Lower Body Dressing/Undressing Lower body dressing      What is the patient wearing?: Pants     Lower body assist Assist for lower body dressing: Moderate Assistance - Patient 50 - 74%     Toileting Toileting Toileting Activity did not occur Landscape architect and hygiene only): Refused  Toileting assist Assist for toileting: Minimal Assistance - Patient > 75% Assistive Device Comment: urinal   Transfers Chair/bed transfer  Transfers assist     Chair/bed transfer assist level: Minimal Assistance - Patient > 75%     Locomotion Ambulation   Ambulation assist   Ambulation activity did not occur: Safety/medical concerns  Assist level: Moderate Assistance - Patient 50 - 74% Assistive device: Walker-rolling Max distance: 120'   Walk 10 feet activity   Assist  Walk 10 feet activity did not occur: Safety/medical concerns  Assist level: Minimal Assistance - Patient > 75% Assistive device: Walker-rolling, Orthosis   Walk 50 feet activity   Assist Walk 50 feet with 2 turns activity did not occur: Safety/medical concerns  Assist  level: Moderate Assistance - Patient - 50 - 74% Assistive device: Walker-rolling, Orthosis    Walk 150 feet activity   Assist Walk 150 feet activity did not occur: Safety/medical concerns  Assist level: Maximal Assistance - Patient 25 - 49% Assistive device: Lite Gait    Walk 10 feet on uneven surface  activity   Assist Walk 10 feet on uneven surfaces activity did not occur:  Safety/medical concerns         Wheelchair     Assist Will patient use wheelchair at discharge?: Yes Type of Wheelchair: Manual    Wheelchair assist level: Minimal Assistance - Patient > 75% Max wheelchair distance: 100    Wheelchair 50 feet with 2 turns activity    Assist        Assist Level: Minimal Assistance - Patient > 75%   Wheelchair 150 feet activity     Assist      Assist Level: Total Assistance - Patient < 25%   Blood pressure 131/89, pulse (!) 57, temperature 98.2 F (36.8 C), resp. rate 18, height 5' 4"  (1.626 m), weight 72 kg, SpO2 99 %.  Medical Problem List and Plan: 1.  Impaired cognition with delayed recall, balance deficits, left hemiparesis, difficulty with ADLs secondary to acute left parasagittal pons infarct.  Continue CIR Team conference today please see physician documentation under team conference tab, met with team  to discuss problems,progress, and goals. Formulized individual treatment plan based on medical history, underlying problem and comorbidities.   2.  Antithrombotics: -DVT/anticoagulation:  Pharmaceutical: Lovenox             -antiplatelet therapy:  ASA daily.  3. Pain Management: Tylenol as needed,  Right knee xray mild tricompartmental OA  Gabapentin 149m TID started on 1/15 with?  Improvement  RIght knee joint pain mild OA, monarticular arthritis improved after asp and injection  4. Mood: LCSW to follow for evaluation and support             -antipsychotic agents: N/A 5. Neuropsych: This patient is?  Fully capable of making decisions on his own behalf. 6. Skin/Wound Care: Routine pressure relief measures.  7. Fluids/Electrolytes/Nutrition: Monitor I/O.   8. HTN: Monitor blood pressures 3 times daily.  Resume Prinivil/hctz as indicated as needed.              Vitals:   12/28/19 2034 12/29/19 0450  BP: 140/79 131/89  Pulse: 84 (!) 57  Resp: 18 18  Temp: 97.7 F (36.5 C) 98.2 F (36.8 C)  SpO2: 97% 99%    Controlled on 1/20 9.T2DM with hyperglycemia: Metformin on hold due to elevation in creatinine--monitor blood sugars ac/hs and titrate as indicated.              CBG (last 3)  Recent Labs    12/28/19 1149 12/28/19 2115 12/29/19 0627  GLUCAP 219* 252* 217*   Elevated/labile on 1/20  Metformin 500 daily started on 1/16  Will increase to BID - will be elevated for 2-3 d following celestone injection  10. Dyslipidemia: Pravastatin changed to atorvastatin for aggressive management. 11. LBBB: [facility]      LOS: 7 days A FACE TO FACE EVALUATION WAS PERFORMED  ACharlett Blake1/20/2021, 9:33 AM

## 2019-12-29 NOTE — Progress Notes (Signed)
Team Conference Report to Patient/Family  Team Conference discussion was reviewed with the patient and caregiver: daughter: [name], including goals for supervision overall, any changes in plan of care and target discharge date of 01/07/20.  Patient and caregiver express understanding and are in agreement.  Family education set up for Thursday afternoon and HH/ DME ordered.  Dorien Chihuahua B 12/29/2019, 2:29 PM

## 2019-12-29 NOTE — Patient Care Conference (Signed)
Inpatient RehabilitationTeam Conference and Plan of Care Update Date: 12/29/2019   Time: 10:05 AM   Patient Name: Eugene Cooper      Medical Record Number: [id number]  Date of Birth: 08-17-1941 Sex: Male         Room/Bed: 4W16C/4W16C-01 Payor Info: Payor: MEDICARE / Plan: MEDICARE PART B / Product Type: *No Product type* /    Admit Date/Time:  12/22/2019  1:07 PM  Primary Diagnosis:  Left pontine stroke [facility])  Patient Active Problem List   Diagnosis Date Noted  . Elevated BUN   . Benign essential HTN   . Controlled type 2 diabetes mellitus with hyperglycemia, without long-term current use of insulin ([place])   . Labile blood glucose   . Neuropathic pain   . Acute ischemic stroke ([place]) 12/22/2019  . Uncontrolled type 2 diabetes mellitus with hyperglycemia, without long-term current use of insulin ([place]) 12/22/2019  . Mixed hyperlipidemia 12/22/2019  . Left pontine stroke ([place]) 12/22/2019  . Essential hypertension   . Left bundle branch block   . Stroke ([place]) 12/20/2019  . Right sided weakness 12/19/2019    Expected Discharge Date: Expected Discharge Date: 01/07/20  Team Members Present: Physician leading conference: Dr. Alysia Penna Social Worker Present: Lennart Pall, LCSW Nurse Present: Dorien Chihuahua, Quin Hoop, RN Case Manager: Karene Fry, RN PT Present: Barrie Folk, PT;Rosita Dechalus, PTA OT Present: Darleen Crocker, OT SLP Present: Charolett Bumpers, SLP PPS Coordinator present : Gunnar Fusi, SLP     Current Status/Progress Goal Weekly Team Focus  Bowel/Bladder   Continent of bowel and bladder. LBM 12/27/19  Remains continent  Assist with toileting needs q shift and as needed   Swallow/Nutrition/ Hydration             ADL's   UB self care set up A, LB self care mod A, toileting mod A, functional transfers min - mod A, bathing on TTB min A  supervision overall  R NMR, self care retraining, balance, functional mobility/transfers   Mobility   SBA bed  mobilitly, minA STS, minA stand pivot, min/modA gait up to 23ft 2/2 poor R foot clearance  Supervision assist  R NMR,  dynamic standing balance, gait, d/c planning   Communication             Safety/Cognition/ Behavioral Observations            Pain   Denies any pain or discomfort so far on this shift. Tylenol PRN ordered  Remains pain free  Assess for pain q4hrs and PRN   Skin   Intact skin  No skin breakdown during this hospital stay  Assess skin q shift    Rehab Goals Patient on target to meet rehab goals: Yes *See Care Plan and progress notes for long and short-term goals.     Barriers to Discharge  Current Status/Progress Possible Resolutions Date Resolved   Nursing                  PT                    OT                  SLP                SW     Plan to discharge home with family who can provide 24/7 assistance          Discharge Planning/Teaching Needs:  Home with daughter and extended  family who will provide 24/7 care  TBD   Team Discussion: R hemi, R knee OA, knee effusion, BS elevated due to steroids.  RN tylenol for R knee pain, cont B/B.  OT min/mod transfers, mod A toileting, mod LB self care, set up UB self care, S goals.  PT min/CGA transfers, amb 35-40' min A, amb mod A 150', goals S.  No SLP.  Home with dtr who can provide 24/7 care.   Revisions to Treatment Plan: N/A     Medical Summary Current Status: Right knee pain improved after injection, participating well in therapy program, still weak on the right side with reduced fine motor Weekly Focus/Goal: Increase mobility now that the pain in the knee has improved  Barriers to Discharge: Medical stability  Barriers to Discharge Comments: Elevated glucose likely secondary to Celestone Possible Resolutions to Barriers: Increased Metformin for blood sugars   Continued Need for Acute Rehabilitation Level of Care: The patient requires daily medical management by a physician with specialized training in  physical medicine and rehabilitation for the following reasons: Direction of a multidisciplinary physical rehabilitation program to maximize functional independence : Yes Medical management of patient stability for increased activity during participation in an intensive rehabilitation regime.: Yes Analysis of laboratory values and/or radiology reports with any subsequent need for medication adjustment and/or medical intervention. : Yes   I attest that I was present, lead the team conference, and concur with the assessment and plan of the team.   Jodell Cipro M 12/29/2019, 7:14 PM   Team conference was held via web/ teleconference due to [place] - 19

## 2019-12-30 ENCOUNTER — Inpatient Hospital Stay (HOSPITAL_COMMUNITY): Payer: Medicare Other | Admitting: Physical Therapy

## 2019-12-30 ENCOUNTER — Inpatient Hospital Stay (HOSPITAL_COMMUNITY): Payer: Medicare Other | Admitting: Occupational Therapy

## 2019-12-30 ENCOUNTER — Inpatient Hospital Stay (HOSPITAL_COMMUNITY): Payer: Medicare Other | Admitting: *Deleted

## 2019-12-30 DIAGNOSIS — I6350 Cerebral infarction due to unspecified occlusion or stenosis of unspecified cerebral artery: Secondary | ICD-10-CM | POA: Diagnosis not present

## 2019-12-30 DIAGNOSIS — I69351 Hemiplegia and hemiparesis following cerebral infarction affecting right dominant side: Secondary | ICD-10-CM | POA: Diagnosis not present

## 2019-12-30 LAB — GLUCOSE, CAPILLARY
Glucose-Capillary: 139 mg/dL — ABNORMAL HIGH (ref 70–99)
Glucose-Capillary: 117 mg/dL — ABNORMAL HIGH (ref 70–99)
Glucose-Capillary: 149 mg/dL — ABNORMAL HIGH (ref 70–99)
Glucose-Capillary: 128 mg/dL — ABNORMAL HIGH (ref 70–99)

## 2019-12-30 MED ORDER — SENNOSIDES-DOCUSATE SODIUM 8.6-50 MG PO TABS
2.0000 | ORAL_TABLET | Freq: Two times a day (BID) | ORAL | Status: DC
  Administered 2019-12-30 – 2020-01-07 (×16): 2 via ORAL
  Filled 2019-12-30 (×16): qty 2

## 2019-12-30 MED ORDER — MUSCLE RUB 10-15 % EX CREA
TOPICAL_CREAM | Freq: Two times a day (BID) | CUTANEOUS | Status: DC | PRN
  Filled 2019-12-30: qty 85

## 2019-12-30 NOTE — Progress Notes (Signed)
Physical Therapy Weekly Progress Note  Patient Details  Name: Eugene Cooper MRN: [id number] Date of Birth: [birth year]  Beginning of progress report period: December 23, 2019 End of progress report period: December 30, 2019  Today's Date: 12/30/2019 PT Individual Time: 1400-1500 and [phone] PT Individual Time Calculation (min): 60 min and 55 min   Patient has met 4 of 4 short term goals.  Pt is making better than expected progress towards LTG. Significant improvement in RUE and RLE coordination, postural control, midline orientation, and endurance have allowed pt to progress to supervision assist bed mobility, min assist transfers to and from [facility], ambulation with RW up to 167f with min-mod assist and minA-supervision assist WC mobility with BUE propulsion.   Patient continues to demonstrate the following deficits muscle weakness, muscle joint tightness and muscle paralysis, decreased cardiorespiratoy endurance, unbalanced muscle activation, ataxia and decreased coordination, decreased visual perceptual skills, decreased attention to right and decreased sitting balance, decreased standing balance, decreased postural control, hemiplegia and decreased balance strategies and therefore will continue to benefit from skilled PT intervention to increase functional independence with mobility.  Patient progressing toward long term goals..  Continue plan of care.  PT Short Term Goals Week 1:  PT Short Term Goal 1 (Week 1): Pt will performed bed mobility with min assist PT Short Term Goal 1 - Progress (Week 1): Met PT Short Term Goal 2 (Week 1): [facility] PT Short Term Goal 2 - Progress (Week 1): Met PT Short Term Goal 3 (Week 1): Pt will ambulate 378fwith mod assist and LRAD PT Short Term Goal 3 - Progress (Week 1): Met PT Short Term Goal 4 (Week 1): Pt will initiate stair management training PT Short Term Goal 4 - Progress (Week 1): Met Week 2:   PT Short Term Goal 1 (Week 2): STG=LTG due to ELOS  Skilled Therapeutic Interventions/Progress Updates:   Pt received supine in bed and agreeable to PT. Supine>sit transfer with supevision assist on the L side of the bed. Ambulatory transfer to toilet with RW and close supervision assist. Improved posture, step height, and AD management compared to previous sessions. Pt with continent bladder void. Peri care, hand hygiene and and clothing management with close supervision assist.   Pt transported to rehab gym. Gait trainnig with RW x 7045fith supervision  Assist fading to min assist with fatigue to improve posture, AD management and with cues for hip/knee flexion to improve foot clearance on the R.   Bed level NeuroMotor Re-education :  Powder board hip. Knee flexion/extension 2 x 15, and knee flexion/ extension 2 x 12 with AAROM into full flexion in each exercise.   Hib abduction with level 2 tband 2 x 10 with emphasis on RLE SAQ x 12 BLE PNF stabilizing reversals for lumber rotation 2 x 10 with 3 sec hold.   bridges x 12 with min assist to stabilize the R hip in neutral ER Rolling R and L x 2 each direction with min assist for improved sequencing and use of UE to initiate trunk movement .   Supine>sit with supervisaion assist after 2 attempts due to poor motor planning. Stand pivot [facility] and supervision assist with cues for step width. Patient returned to room and [facility] reach and all needs met.     Session 2.  Pt received supine in bed and agreeable to PT. Supine>sit transfer  with supervision assist and min cues for awareness of RLE. Ambulatory transfer to toilet with min assist and RW with min cues for step length over ledge into bathroom. Hand hygiene and clothing management performed with supervision assist.   WC mobility with BUE propulsion x 158f with CGA and min-mod cues for increased force on the R side to avoid door frames and turning  technique to the L.   Body weight supported treadmill training 3 bouts x 3060feach at 0.9m6m 0.8 mph, and 0.8 mph respectively. Min assist on first bout with min-mod assist on 2nd and 3rd bouts for improved upright trunk posture, symmetrical step length, improved step width and knee flexion in RLE limb advancement as well as appropriate weight shifting to allow full clearance on the RLE.   Overground gait training x 100f88fth min assist fading to mod assist for last 15ft49f to increasing trunk flexion, decreased safety with AD management and increasing foot drag. Moderate cues for RW positioning and improve posture with noted improvement step symmetry and height, but unable to sustain beyond 5-6 steps.   Pt returned to room and performed ambulatory transfer to bed with min A. Sit>supine completed with supervision assist and increased time due to fatigue. Pt left supine in bed with call bell in reach and all needs met.   Interpreter present throuh both PT treatments.      Therapy Documentation Precautions:  Precautions Precautions: Fall Restrictions Weight Bearing Restrictions: No Vital Signs: Therapy Vitals Temp: 98.2 F (36.8 C) Pulse Rate: (!) 101 Resp: 20 BP: 129/78 Patient Position (if appropriate): Lying Oxygen Therapy SpO2: (!) 86 % O2 Device: Room Air Pain: denies  Therapy/Group: Individual Therapy  AustiLorie Phenix/2021, 3:07 PM

## 2019-12-30 NOTE — Progress Notes (Signed)
Occupational Therapy Session Note  Patient Details  Name: Eugene Cooper MRN: [id number] Date of Birth: 1941-09-15  Today's Date: 12/30/2019 OT Individual Time: 1100-1200 OT Individual Time Calculation (min): 60 min    Short Term Goals: Week 1:  OT Short Term Goal 1 (Week 1): Pt will maintain standing balance with min A for LB clothing management. OT Short Term Goal 2 (Week 1): Pt will perform UB dressing with min A overall. OT Short Term Goal 3 (Week 1): Pt will perform toilet transfer with min A overall. OT Short Term Goal 4 (Week 1): Pt will perform bathing at shower level with min A and modifications as needed.  Skilled Therapeutic Interventions/Progress Updates:    Upon entering the room, pt supine in bed with interpreter present. Pt requesting to use bathroom for BM. Supine >sit with supervision getting out on L side of bed. Sit >stand with min guard and ambulating with RW and min A into bathroom. Pt performing hygiene and clothing management with min A for standing balance. Pt able to have successful BM and doffing all clothing while seated on commode. Pt ambulating without use of AE 5' onto TTB with mod A. Pt bathing at shower level with min A overall. Pt transitioned to sitting back on commode for LB dressing. Pt continues to need assist to thread R foot into pant leg. Pt exited the bathroom and standing at sink for 6 minutes during grooming tasks with mirror for visual feedback and min multimodal cuing for midline and positioning. Pt returning to wheelchair and demonstrated ability to button and unbotton shirt this session with B UEs and increased time. UB clothing management with set up A to obtain clothing item. Pt remained in wheelchair with chair alarm belt donned and call bell within reach.   Therapy Documentation Precautions:  Precautions Precautions: Fall Restrictions Weight Bearing Restrictions: No   Therapy/Group: Individual Therapy  Gypsy Decant 12/30/2019,  12:31 PM

## 2019-12-30 NOTE — Progress Notes (Signed)
[place] PHYSICAL MEDICINE & REHABILITATION PROGRESS NOTE   Subjective/Complaints:  In person interpreter used Seen in PT.  C/o constipation and low back pain , no abd pain except with pressurew   ROS: Limited due to language, but appears to deny CP, shortness of breath, nausea, vomiting or diarrhea.  Objective:   No results found. No results for input(s): WBC, HGB, HCT, PLT in the last 72 hours. No results for input(s): NA, K, CL, CO2, GLUCOSE, BUN, CREATININE, CALCIUM in the last 72 hours.  Intake/Output Summary (Last 24 hours) at 12/30/2019 0918 Last data filed at 12/29/2019 1900 Gross per 24 hour  Intake 600 ml  Output 175 ml  Net 425 ml     Physical Exam: Vital Signs Blood pressure 102/70, pulse 72, temperature 97.8 F (36.6 C), resp. rate 20, height 5\' 4"  (1.626 m), weight 72 kg, SpO2 96 %. Constitutional: No distress . Vital signs reviewed. HENT: Normocephalic.  Atraumatic. Eyes: EOMI. No discharge. Cardiovascular: No JVD. Respiratory: Normal effort.  No stridor. GI: Non-distended.mild diffuse tenderness, + BS Skin: Warm and dry.  Intact. Psych: Normal mood.  Normal behavior. Musc: Right knee effusion on erythema, no skin lesions, pain with ROM   No tenderness in extremities. Neurologic: Alert Motor: RUE/RLE: 3/5 proximal to distal, Left side 5/5  Assessment/Plan: 1. 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 which require 3+ hours per day of interdisciplinary therapy in a comprehensive inpatient rehab setting.  Physiatrist is providing close team supervision and 24 hour management of active medical problems listed below.  Physiatrist and rehab team continue to assess barriers to discharge/monitor patient progress toward functional and medical goals  Care Tool:  Bathing    Body parts bathed by patient: Right arm, Chest, Abdomen, Front perineal area, Right upper leg, Left upper leg, Face, Left arm, Buttocks, Left lower leg   Body parts  bathed by helper: Left arm, Buttocks, Right lower leg, Left lower leg Body parts n/a: Right lower leg   Bathing assist Assist Level: Minimal Assistance - Patient > 75%     Upper Body Dressing/Undressing Upper body dressing Upper body dressing/undressing activity did not occur (including orthotics): N/A What is the patient wearing?: Pull over shirt    Upper body assist Assist Level: Set up assist    Lower Body Dressing/Undressing Lower body dressing      What is the patient wearing?: Pants     Lower body assist Assist for lower body dressing: Moderate Assistance - Patient 50 - 74%     Toileting Toileting Toileting Activity did not occur Landscape architect and hygiene only): Refused  Toileting assist Assist for toileting: Minimal Assistance - Patient > 75% Assistive Device Comment: urinal   Transfers Chair/bed transfer  Transfers assist     Chair/bed transfer assist level: Supervision/Verbal cueing     Locomotion Ambulation   Ambulation assist   Ambulation activity did not occur: Safety/medical concerns  Assist level: Moderate Assistance - Patient 50 - 74% Assistive device: Walker-rolling Max distance: 150   Walk 10 feet activity   Assist  Walk 10 feet activity did not occur: Safety/medical concerns  Assist level: Moderate Assistance - Patient - 50 - 74% Assistive device: Walker-rolling   Walk 50 feet activity   Assist Walk 50 feet with 2 turns activity did not occur: Safety/medical concerns  Assist level: Moderate Assistance - Patient - 50 - 74% Assistive device: Walker-rolling    Walk 150 feet activity   Assist Walk 150 feet activity  did not occur: Safety/medical concerns  Assist level: Moderate Assistance - Patient - 50 - 74% Assistive device: Lite Gait    Walk 10 feet on uneven surface  activity   Assist Walk 10 feet on uneven surfaces activity did not occur: Safety/medical concerns         Wheelchair     Assist Will  patient use wheelchair at discharge?: Yes Type of Wheelchair: Manual    Wheelchair assist level: Minimal Assistance - Patient > 75% Max wheelchair distance: 150    Wheelchair 50 feet with 2 turns activity    Assist        Assist Level: Maximal Assistance - Patient 25 - 49%   Wheelchair 150 feet activity     Assist      Assist Level: Maximal Assistance - Patient 25 - 49%   Blood pressure 102/70, pulse 72, temperature 97.8 F (36.6 C), resp. rate 20, height 5\' 4"  (1.626 m), weight 72 kg, SpO2 96 %.  Medical Problem List and Plan: 1.  Impaired cognition with delayed recall, balance deficits, left hemiparesis, difficulty with ADLs secondary to acute left parasagittal pons infarct.  Continue CIR PT< OT    2.  Antithrombotics: -DVT/anticoagulation:  Pharmaceutical: Lovenox             -antiplatelet therapy:  ASA daily.  3. Pain Management: Tylenol as needed,  Right knee xray mild tricompartmental OA  Gabapentin 100mg  TID started on 1/15 with?  Improvement  RIght knee joint pain mild OA, monarticular arthritis improved after asp and injection Low bvack pain only sicne hospitalization will start sports cream  4. Mood: LCSW to follow for evaluation and support             -antipsychotic agents: N/A 5. Neuropsych: This patient is?  Fully capable of making decisions on his own behalf. 6. Skin/Wound Care: Routine pressure relief measures.  7. Fluids/Electrolytes/Nutrition: Monitor I/O.   8. HTN: Monitor blood pressures 3 times daily.  Resume Prinivil/hctz as indicated as needed.              Vitals:   12/29/19 2125 12/30/19 0404  BP:  102/70  Pulse:  72  Resp:  20  Temp:  97.8 F (36.6 C)  SpO2: 97% 96%   Controlled on 1/21 9.T2DM with hyperglycemia: Metformin on hold due to elevation in creatinine--monitor blood sugars ac/hs and titrate as indicated.              CBG (last 3)  Recent Labs    12/29/19 1657 12/29/19 2124 12/30/19 0622  GLUCAP 144* 186* 139*    Fair control  Metformin 500 daily started on 1/16  Will increase to BID - will be elevated for 2-3 d following celestone injection, should start coming down   10. Dyslipidemia: Pravastatin changed to atorvastatin for aggressive management. 11. LBBB: Follow up with cardiology for [facility] after discharge.  12 COnstipation - adjust laxative    LOS: 8 days A FACE TO FACE EVALUATION WAS PERFORMED  Charlett Blake 12/30/2019, 9:18 AM

## 2019-12-31 ENCOUNTER — Inpatient Hospital Stay (HOSPITAL_COMMUNITY): Payer: Medicare Other | Admitting: Physical Therapy

## 2019-12-31 ENCOUNTER — Inpatient Hospital Stay (HOSPITAL_COMMUNITY): Payer: Medicare Other | Admitting: Occupational Therapy

## 2019-12-31 DIAGNOSIS — I69351 Hemiplegia and hemiparesis following cerebral infarction affecting right dominant side: Secondary | ICD-10-CM | POA: Diagnosis not present

## 2019-12-31 DIAGNOSIS — I6350 Cerebral infarction due to unspecified occlusion or stenosis of unspecified cerebral artery: Secondary | ICD-10-CM | POA: Diagnosis not present

## 2019-12-31 LAB — GLUCOSE, CAPILLARY
Glucose-Capillary: 134 mg/dL — ABNORMAL HIGH (ref 70–99)
Glucose-Capillary: 111 mg/dL — ABNORMAL HIGH (ref 70–99)
Glucose-Capillary: 113 mg/dL — ABNORMAL HIGH (ref 70–99)
Glucose-Capillary: 137 mg/dL — ABNORMAL HIGH (ref 70–99)

## 2019-12-31 MED ORDER — SIMETHICONE 40 MG/0.6ML PO SUSP
40.0000 mg | Freq: Four times a day (QID) | ORAL | Status: DC | PRN
  Filled 2019-12-31: qty 0.6

## 2019-12-31 NOTE — Progress Notes (Signed)
[facility] PHYSICAL MEDICINE & REHABILITATION PROGRESS NOTE   Subjective/Complaints:  In person interpreter used   C/o constipation , no abd pain does complain of excessive gas      ROS:  deny CP, shortness of breath, nausea, vomiting or diarrhea.  Objective:   No results found. No results for input(s): WBC, HGB, HCT, PLT in the last 72 hours. No results for input(s): NA, K, CL, CO2, GLUCOSE, BUN, CREATININE, CALCIUM in the last 72 hours.  Intake/Output Summary (Last 24 hours) at 12/31/2019 1629 Last data filed at 12/31/2019 1248 Gross per 24 hour  Intake 700 ml  Output 750 ml  Net -50 ml     Physical Exam: Vital Signs Blood pressure 124/81, pulse 91, temperature 98.1 F (36.7 C), resp. rate 14, height 5\' 4"  (1.626 m), weight 71.3 kg, SpO2 96 %. Constitutional: No distress . Vital signs reviewed. HENT: Normocephalic.  Atraumatic. Eyes: EOMI. No discharge. Cardiovascular: No JVD. Respiratory: Normal effort.  No stridor. GI: Non-distended.mild diffuse tenderness, + BS Skin: Warm and dry.  Intact. Psych: Normal mood.  Normal behavior. Musc: Right knee effusion on erythema, no skin lesions, pain with ROM   No tenderness in extremities. Neurologic: Alert Motor: 3 - right deltoid bicep tricep finger flexors hip flexors 4 - knee extensor 3 - ankle dorsiflexor left side 5/5  Assessment/Plan: 1. 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 which require 3+ hours per day of interdisciplinary therapy in a comprehensive inpatient rehab setting.  Physiatrist is providing close team supervision and 24 hour management of active medical problems listed below.  Physiatrist and rehab team continue to assess barriers to discharge/monitor patient progress toward functional and medical goals  Care Tool:  Bathing    Body parts bathed by patient: Right arm, Chest, Abdomen, Front perineal area, Right upper leg, Left upper leg, Face, Left arm, Buttocks, Left lower leg   Body parts bathed by helper: Right lower leg Body parts n/a: Right lower leg   Bathing assist Assist Level: Minimal Assistance - Patient > 75%     Upper Body Dressing/Undressing Upper body dressing Upper body dressing/undressing activity did not occur (including orthotics): N/A What is the patient wearing?: Pull over shirt    Upper body assist Assist Level: Set up assist    Lower Body Dressing/Undressing Lower body dressing      What is the patient wearing?: Underwear/pull up, Pants     Lower body assist Assist for lower body dressing: Minimal Assistance - Patient > 75%     Toileting Toileting Toileting Activity did not occur Landscape architect and hygiene only): Refused  Toileting assist Assist for toileting: Minimal Assistance - Patient > 75% Assistive Device Comment: urinal   Transfers Chair/bed transfer  Transfers assist     Chair/bed transfer assist level: Minimal Assistance - Patient > 75%     Locomotion Ambulation   Ambulation assist   Ambulation activity did not occur: Safety/medical concerns  Assist level: Moderate Assistance - Patient 50 - 74% Assistive device: Walker-rolling Max distance: 200   Walk 10 feet activity   Assist  Walk 10 feet activity did not occur: Safety/medical concerns  Assist level: Minimal Assistance - Patient > 75% Assistive device: Walker-rolling   Walk 50 feet activity   Assist Walk 50 feet with 2 turns activity did not occur: Safety/medical concerns  Assist level: Minimal Assistance - Patient > 75% Assistive device: Walker-rolling    Walk 150 feet activity   Assist Walk 150 feet activity  did not occur: Safety/medical concerns  Assist level: Moderate Assistance - Patient - 50 - 74% Assistive device: Walker-rolling    Walk 10 feet on uneven surface  activity   Assist Walk 10 feet on uneven surfaces activity did not occur: Safety/medical concerns         Wheelchair     Assist Will patient use  wheelchair at discharge?: Yes Type of Wheelchair: Manual    Wheelchair assist level: Minimal Assistance - Patient > 75% Max wheelchair distance: 150    Wheelchair 50 feet with 2 turns activity    Assist        Assist Level: Minimal Assistance - Patient > 75%   Wheelchair 150 feet activity     Assist      Assist Level: Minimal Assistance - Patient > 75%   Blood pressure 124/81, pulse 91, temperature 98.1 F (36.7 C), resp. rate 14, height 5\' 4"  (1.626 m), weight 71.3 kg, SpO2 96 %.  Medical Problem List and Plan: 1.  Impaired cognition with delayed recall, balance deficits, left hemiparesis, difficulty with ADLs secondary to acute left parasagittal pons infarct.  Continue CIR PT< OT    2.  Antithrombotics: -DVT/anticoagulation:  Pharmaceutical: Lovenox             -antiplatelet therapy:  ASA daily.  3. Pain Management: Tylenol as needed,  Right knee xray mild tricompartmental OA  Gabapentin 100mg  TID started on 1/15 with?  Improvement  RIght knee joint pain mild OA, monarticular arthritis improved after asp and injection Low back pain only since hospitalization will start sports cream  4. Mood: LCSW to follow for evaluation and support             -antipsychotic agents: N/A 5. Neuropsych: This patient is?  Fully capable of making decisions on his own behalf. 6. Skin/Wound Care: Routine pressure relief measures.  7. Fluids/Electrolytes/Nutrition: Monitor I/O.   8. HTN: Monitor blood pressures 3 times daily.  Resume Prinivil/hctz as indicated as needed.              Vitals:   12/31/19 0406 12/31/19 1357  BP: 129/90 124/81  Pulse: 81 91  Resp: 16 14  Temp: 97.6 F (36.4 C) 98.1 F (36.7 C)  SpO2: 96% 96%   Controlled on 1/21 9.T2DM with hyperglycemia: Metformin on hold due to elevation in creatinine--monitor blood sugars ac/hs and titrate as indicated.              CBG (last 3)  Recent Labs    12/30/19 1657 12/30/19 2116 12/31/19 0613  GLUCAP 149* 128*  134*   Fair control  Metformin 500 daily started on 1/16  Will increase to BID - will be elevated for about another day following celestone injection, should start coming down   10. Dyslipidemia: Pravastatin changed to atorvastatin for aggressive management. 11. LBBB: Follow up with cardiology for [facility] after discharge.  12 COnstipation - adjust laxative    LOS: 9 days A FACE TO FACE EVALUATION WAS PERFORMED  Eugene Cooper 12/31/2019, 4:29 PM

## 2019-12-31 NOTE — Evaluation (Signed)
Recreational Therapy Assessment and Plan  Patient Details  Name: Eugene Cooper MRN: [id number] Date of Birth: [birth year] Today's Date: 12/31/2019  Rehab Potential:  Good ELOS:   D/c 1/29  Assessment Problem List:      Patient Active Problem List   Diagnosis Date Noted  . Acute ischemic stroke ([place]) 12/22/2019  . Uncontrolled type 2 diabetes mellitus with hyperglycemia, without long-term current use of insulin ([place]) 12/22/2019  . Mixed hyperlipidemia 12/22/2019  . 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 Essential hypertension   . Left bundle branch block   . Stroke ([place]) 12/20/2019  . Right sided weakness 12/19/2019    Past Medical History:      Past Medical History:  Diagnosis Date  . Hyperlipemia   . Hypertension    Past Surgical History: History reviewed. No pertinent surgical history.  Assessment & Plan Clinical Impression: Patient is a 79 year old non-English-speaking Hispanic male with history of HTN, T2DM, dyslipidemia who was admitted on 12/29/2019 right-sided weakness and gait abnormality. Yesterday from chart review due to language and cognition. Patient reported 2 to 3-day history of having nausea vomiting and diarrhea as well as diffuse abdominal pain with body aches. Respiratory panel negative. CT head negative for acute changes. 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 CT abdomen pelvis showed hepatic steatosis with no acute abnormality. Hepatitis panel was done due to abnormal LFTs and was negative. MRA head/neck was negative for stenosis or dissection. Echocardiogram showed ejection fraction of 55 to 60% with mild left ventricular hypertrophy and no wall abnormality. ASA recommended stroke for secondary stroke prevention. EKG showed left bundle branch block and Dr. Branch/cardiology recommended Carlton Adam on outpatient basis for work-up. Therapy evaluation showed impaired cognition with delayed  recall, balance deficits and difficulty with ADLs. CIR recommended due to functional decline.  Patient transferred to CIR on 12/22/2019 .   Pt presents with decreased activity tolerance, decreased functional mobility, decreased balance, decreased coordination Limiting pt's independence with leisure/community pursuits.  Pt reports that he loves gardening, working in the yard and walking.  Plan  Min 1 time for community reintegration skills during LOS  Recommendations for other services: None   Discharge Criteria: Patient will be discharged from TR if patient refuses treatment 3 consecutive times without medical reason.  If treatment goals not met, if there is a change in medical status, if patient makes no progress towards goals or if patient is discharged from hospital.  The above assessment, treatment plan, treatment alternatives and goals were discussed and mutually agreed upon: by patient  [place] 12/31/2019, 1:06 PM

## 2019-12-31 NOTE — Progress Notes (Signed)
Physical Therapy Session Note  Patient Details  Name: Eugene Cooper MRN: [id number] Date of Birth: 02/08/1941  Today's Date: 12/31/2019 PT Individual Time: 1400-1500 PT Individual Time Calculation (min): 60 min   Short Term Goals: Week 2:  PT Short Term Goal 1 (Week 2): STG=LTG due to ELOS  Skilled Therapeutic Interventions/Progress Updates:   Pt received supine in bed and agreeable to PT. Supine>sit transfer with supervision assist and heavy use of rails. Ambulatory transfer to toilet with RW and CGA, 1 mild LOB over ledge into bathroom. Able to complete clothing management and personal hygiene with distant supervision assist.   WC propulsion to force use of RUE x 171f and min assist intermittently to prevent hitting wall on the R.   Dynamic balance training standing on level ground with R and L reach 2 x 8. Standing on airex pad tower build with foam blocks using the RUE and deconstruct with the LUE. Cues for improved gluteal activation and symmetrical posture throughout balance training with marked Improvement with increased repetition.   Gait training with RW while kicking cone with the RLE x 519f noted to have significant improvement in hip flexor, activation with significant decrease in foot drag. Additional gait training with RW x [id number] min assist on the first bout and min fading to mod on the second bout. Cues for continues use of kicking motor plan, and improved posture to decrease foot drag and improve safety. Able to sustain for ~75% steps until fatigued.   Nustep reciprocal movement training x 6 minutes with min cues for posture and symmetry of movement. Pt returned to room and performed stand pivot  transfer to bed with min A. Sit>supine completed with supervision assist and min cues for sequencing of RLE movement and left supine in bed with call bell in reach and all needs met.          Therapy Documentation Precautions:  Precautions Precautions:  Fall Restrictions Weight Bearing Restrictions: No    Vital Signs: Therapy Vitals Temp: 98.1 F (36.7 C) Pulse Rate: 91 Resp: 14 BP: 124/81 Patient Position (if appropriate): Lying Oxygen Therapy SpO2: 96 % O2 Device: Room Air Pain: denies   Therapy/Group: Individual Therapy  AustLorie Phenix2/2021, 3:19 PM

## 2019-12-31 NOTE — Progress Notes (Signed)
Occupational Therapy Weekly Progress Note  Patient Details  Name: Eugene Cooper MRN: [id number] Date of Birth: [birth year]  Beginning of progress report period: 12/23/2019 End of progress report period: 12/31/2019  :Today's Date: 12/31/2019 OT Individual Time: 0900-1000 and 1105-1202 OT Individual Time Calculation (min): 60 min and 57 min   Patient has met 4 of 4 short term goals.    Pt is making excellent progress towards LTGs at this time. At time of evaluation, pt required Mod A for stand pivot toilet and shower transfers, where he now completes stated transfers with Min A at ambulatory level with RW. He required Mod-Max A for dynamic standing balance during self care tasks, progressing to requiring Min A at time of report. Rt hand function is also improving, with pt now able to button his shirt, which he could not do last week! Continue with OT POC, goals set at supervision assist overall.   Patient continues to demonstrate the following deficits: muscle weakness, decreased cardiorespiratoy endurance, unbalanced muscle activation and decreased coordination and decreased standing balance, decreased postural control and hemiplegia and therefore will continue to benefit from skilled OT intervention to enhance overall performance with BADL.  Patient progressing toward long term goals..  Continue plan of care.  OT Short Term Goals Week 1:  OT Short Term Goal 1 (Week 1): Pt will maintain standing balance with min A for LB clothing management. OT Short Term Goal 1 - Progress (Week 1): Met OT Short Term Goal 2 (Week 1): Pt will perform UB dressing with min A overall. OT Short Term Goal 2 - Progress (Week 1): Met OT Short Term Goal 3 (Week 1): Pt will perform toilet transfer with min A overall. OT Short Term Goal 3 - Progress (Week 1): Met OT Short Term Goal 4 (Week 1): Pt will perform bathing at shower level with min A and modifications as needed. OT Short Term Goal 4 - Progress (Week 1):  Met Week 2:  OT Short Term Goal 1 (Week 2): STGs=LTGs due to ELOS  Skilled Therapeutic Interventions/Progress Updates:    Pt greeted in bed with a little bit of leg pain, not wanting medicine from RN to address this. Agreeable to shower. Supervision for bed mobility and then pt ambulated to the TTB using RW with steady assist. Min A to doff Rt gripper sock when removing clothing. He then bathed at sit<stand level, min vcs to increase functional use of Rt. Able to use affected UE to stabilize soap bottle while uncapping and dispensing soap and also for washing Lt side. Steady assist for dynamic standing balance while completing perihygiene. He then dressed sit<stand from TTB. Increased hip pain when R LE was placed in figure 4 position. He needed A to thread clothing on the Rt side and also to don Rt gripper sock. Able to use Rt hand when elevating pants in standing. He then ambulated to the sink and completed denture care in standing. After OT donned Ted hose, pt worked on dynamic standing balance, functional ambulation, and bilateral hand use during bedmaking tasks. Instructed pt on side-stepping technique while safely managing RW in front of bed. Pt bending outside of base of support to strip linen, apply fitted sheet, sheets, and covers, incorporating Rt hand due to bilateral demands of tasks. Taught pt how to drape linen over front of walker when transporting items from chair to bed as well. Donning pillowcases also completed in standing. He then returned to flat bed without bedrails, per setup at home with  supervision assist. When pt was repositioned for comfort, discussed his breathing as we noticed some whistling sounds near end of tx. He reported having some chest pain, rated 5/10 in severity. 02 sats 96-97% on RA. Notified RN of pts breathing and pain c/o. Pt left comfortably in bed with all needs within reach and bed alarm set.   Interpretor present during session   2nd Session 1:1 tx (57 min) Pt  greeted in bed, reporting his pain felt better after receiving some medicine from RN. Per RN, no therapy restrictions at this time. Pt wanted to start session by using the bathroom. Supine<sit from flat bed without bedrails completed with supervision and increased time when pushing up through Rt side. Pt then ambulated into the bathroom with steady assist using device. Steady assist for balance during clothing mgt and he completed hygiene while seated post BM void. Pt reported using the bathroom helped with chest discomfort as well. After handwashing at the sink, pt returned to his w/c. Worked on Rt hand [place] via small pegboard tasks. Pt relied on board to manipulate pegs in hand but able to meet demands of activity with vcs for using Rt vs Lt. Increased time and challenge when stacking pegs and when reaching across table to gather pegs from bin. Worked on standing balance as well, with pt able to stand for 3 minute windows before needing to sit. Verbal and manual facilitation for increasing midline orientation due to Lt lean, steady assist for balance. At end of session pt returned to his w/c, agreeable to remain sitting up for lunch. Left him with all needs within reach and safety belt fastened.   Interpretor present during session   Therapy Documentation Precautions:  Precautions Precautions: Fall Restrictions Weight Bearing Restrictions: No Pain: Pain Assessment Pain Scale: 0-10 Pain Score: 5  ADL:     Therapy/Group: Individual Therapy  Maybel Dambrosio A Keary Waterson 12/31/2019, 12:13 PM

## 2020-01-01 ENCOUNTER — Inpatient Hospital Stay (HOSPITAL_COMMUNITY): Payer: Medicare Other | Admitting: Physical Therapy

## 2020-01-01 DIAGNOSIS — R7309 Other abnormal glucose: Secondary | ICD-10-CM

## 2020-01-01 DIAGNOSIS — I69351 Hemiplegia and hemiparesis following cerebral infarction affecting right dominant side: Secondary | ICD-10-CM | POA: Diagnosis not present

## 2020-01-01 DIAGNOSIS — I10 Essential (primary) hypertension: Secondary | ICD-10-CM | POA: Diagnosis not present

## 2020-01-01 DIAGNOSIS — I6350 Cerebral infarction due to unspecified occlusion or stenosis of unspecified cerebral artery: Secondary | ICD-10-CM | POA: Diagnosis not present

## 2020-01-01 LAB — GLUCOSE, CAPILLARY
Glucose-Capillary: 133 mg/dL — ABNORMAL HIGH (ref 70–99)
Glucose-Capillary: 99 mg/dL (ref 70–99)
Glucose-Capillary: 133 mg/dL — ABNORMAL HIGH (ref 70–99)
Glucose-Capillary: 150 mg/dL — ABNORMAL HIGH (ref 70–99)

## 2020-01-01 NOTE — Progress Notes (Signed)
[facility] PHYSICAL MEDICINE & REHABILITATION PROGRESS NOTE   Subjective/Complaints:  Pt in bed. Says his low back bothers him a little. Otherwise he denies pain and says he has no questions.   ROS: Patient denies fever, rash, sore throat, blurred vision, nausea, vomiting, diarrhea, cough, shortness of breath or chest pain, joint or back pain, headache, or mood change.   Objective:   No results found. No results for input(s): WBC, HGB, HCT, PLT in the last 72 hours. No results for input(s): NA, K, CL, CO2, GLUCOSE, BUN, CREATININE, CALCIUM in the last 72 hours.  Intake/Output Summary (Last 24 hours) at 01/01/2020 1226 Last data filed at 01/01/2020 0900 Gross per 24 hour  Intake 420 ml  Output --  Net 420 ml     Physical Exam: Vital Signs Blood pressure 121/78, pulse 70, temperature 97.8 F (36.6 C), temperature source Oral, resp. rate 16, height 5\' 4"  (1.626 m), weight 71.3 kg, SpO2 97 %. Constitutional: No distress . Vital signs reviewed. HEENT: EOMI, oral membranes moist Neck: supple Cardiovascular: RRR without murmur. No JVD    Respiratory: CTA Bilaterally without wheezes or rales. Normal effort    GI: BS +, non-tender, non-distended  Skin: Warm and dry.  Intact. Psych: Normal mood.  Normal behavior. Musc: Right knee effusion on erythema, no skin lesions, pain with ROM   Mild TTP in LB Neurologic: Alert Motor: 3 - right deltoid bicep tricep finger flexors hip flexors 4 - knee extensor 3 - ankle dorsiflexor left side 5/5  Assessment/Plan: 1. 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 which require 3+ hours per day of interdisciplinary therapy in a comprehensive inpatient rehab setting.  Physiatrist is providing close team supervision and 24 hour management of active medical problems listed below.  Physiatrist and rehab team continue to assess barriers to discharge/monitor patient progress toward functional and medical goals  Care Tool:  Bathing     Body parts bathed by patient: Right arm, Chest, Abdomen, Front perineal area, Right upper leg, Left upper leg, Face, Left arm, Buttocks, Left lower leg   Body parts bathed by helper: Right lower leg Body parts n/a: Right lower leg   Bathing assist Assist Level: Minimal Assistance - Patient > 75%     Upper Body Dressing/Undressing Upper body dressing Upper body dressing/undressing activity did not occur (including orthotics): N/A What is the patient wearing?: Pull over shirt    Upper body assist Assist Level: Set up assist    Lower Body Dressing/Undressing Lower body dressing      What is the patient wearing?: Underwear/pull up, Pants     Lower body assist Assist for lower body dressing: Minimal Assistance - Patient > 75%     Toileting Toileting Toileting Activity did not occur Landscape architect and hygiene only): Refused  Toileting assist Assist for toileting: Minimal Assistance - Patient > 75% Assistive Device Comment: urinal   Transfers Chair/bed transfer  Transfers assist     Chair/bed transfer assist level: Contact Guard/Touching assist     Locomotion Ambulation   Ambulation assist   Ambulation activity did not occur: Safety/medical concerns  Assist level: Minimal Assistance - Patient > 75% Assistive device: Walker-rolling Max distance: 120   Walk 10 feet activity   Assist  Walk 10 feet activity did not occur: Safety/medical concerns  Assist level: Minimal Assistance - Patient > 75% Assistive device: Walker-rolling   Walk 50 feet activity   Assist Walk 50 feet with 2 turns activity did not occur:  Safety/medical concerns  Assist level: Minimal Assistance - Patient > 75% Assistive device: Walker-rolling    Walk 150 feet activity   Assist Walk 150 feet activity did not occur: Safety/medical concerns  Assist level: Moderate Assistance - Patient - 50 - 74% Assistive device: Walker-rolling    Walk 10 feet on uneven surface   activity   Assist Walk 10 feet on uneven surfaces activity did not occur: Safety/medical concerns         Wheelchair     Assist Will patient use wheelchair at discharge?: Yes Type of Wheelchair: Manual    Wheelchair assist level: Minimal Assistance - Patient > 75% Max wheelchair distance: 150    Wheelchair 50 feet with 2 turns activity    Assist        Assist Level: Minimal Assistance - Patient > 75%   Wheelchair 150 feet activity     Assist      Assist Level: Minimal Assistance - Patient > 75%   Blood pressure 121/78, pulse 70, temperature 97.8 F (36.6 C), temperature source Oral, resp. rate 16, height 5\' 4"  (1.626 m), weight 71.3 kg, SpO2 97 %.  Medical Problem List and Plan: 1.  Impaired cognition with delayed recall, balance deficits, left hemiparesis, difficulty with ADLs secondary to acute left parasagittal pons infarct.  Continue CIR PT< OT    2.  Antithrombotics: -DVT/anticoagulation:  Pharmaceutical: Lovenox             -antiplatelet therapy:  ASA daily.  3. Pain Management: Tylenol as needed,  Right knee xray mild tricompartmental OA  Gabapentin 100mg  TID started on 1/15 with?  Improvement  RIght knee joint pain mild OA, monarticular arthritis improved after asp and injection  Low back pain only since hospitalization  -- sports cream    1/23 add Kpad 4. Mood: LCSW to follow for evaluation and support             -antipsychotic agents: N/A 5. Neuropsych: This patient is?  Fully capable of making decisions on his own behalf. 6. Skin/Wound Care: Routine pressure relief measures.  7. Fluids/Electrolytes/Nutrition: Monitor I/O.   8. HTN: Monitor blood pressures 3 times daily.  Resume Prinivil/hctz as indicated as needed.              Vitals:   12/31/19 2027 01/01/20 0435  BP: (!) 148/125 121/78  Pulse: 85 70  Resp: 18 16  Temp: 98.4 F (36.9 C) 97.8 F (36.6 C)  SpO2: 97% 97%   1/23 One isolated very high DBP yesterday---normal this  morning. Follow for trend 9.T2DM with hyperglycemia: Metformin on hold due to elevation in creatinine--monitor blood sugars ac/hs and titrate as indicated.              CBG (last 3)  Recent Labs    12/31/19 2033 01/01/20 0601 01/01/20 1148  GLUCAP 137* 133* 99   Fair control  Metformin 500 BID -    1/23 improving after steroid injection  10. Dyslipidemia: Pravastatin changed to atorvastatin for aggressive management. 11. LBBB: Follow up with cardiology for [facility] after discharge.  12 COnstipation - adjusted laxative    LOS: 10 days A FACE TO FACE EVALUATION WAS PERFORMED  Meredith Staggers 01/01/2020, 12:26 PM

## 2020-01-01 NOTE — Progress Notes (Signed)
  Patient ID: Eugene Cooper, male   DOB: 08/10/1941, 79 y.o.   MRN: [id number]  Diagnosis codes:  I63.50;  I44.7  Height:  5'4"  Weight:  158 lbs    Patient suffers from hemiplegia and weakness following left pontine stroke and chronic left bundle branch heart block which impairs their ability to perform daily activities like bathing, dressing and toileting in the home.  A walker or cane will not resolve the issues with performing these ADLs.  A high strength lightweight wheelchair will allow patient to safely perform daily activities.  This wheelchair will be used primarily in the home setting.  The patient is not able to propel themselves in the home using a standard or lightweight wheelchair due to arm weakness and endurance.  Patient can self propel in the high strength lightweight wheelchair.  I have personally performed a face to face evaluation of this patient and recommend this wheelchair.   Reesa Chew, PA-C

## 2020-01-01 NOTE — Progress Notes (Signed)
Physical Therapy Session Note  Patient Details  Name: Eugene Cooper MRN: [id number] Date of Birth: 12-Apr-1941  Today's Date: 01/01/2020 PT Individual Time: 0900-1005 PT Individual Time Calculation (min): 65 min   Short Term Goals: Week 2:  PT Short Term Goal 1 (Week 2): STG=LTG due to ELOS  Skilled Therapeutic Interventions/Progress Updates:   Pt received supine in bed and agreeable to PT. Supine>sit transfer with supervision assist on the Rwith cues for decreased use of rail as tolerated. Ambulatory transfer to toilet with RW and CGA. pt Able to perform clothing management and pericare with supervision assist.   Pt transfers to rehab gym. PT treated focused on variable gait training for improved neuromotor control of the RLE and trunk: Without AD x 178f. Min assist fading to mod assist due to increasing scissoring on the R with decreasing trunk control resulting in constant R LOB.  Forward/backward, no AD, 2 x 27feach with visual feedback on the floor for increased step width. Intermittent min-mod assist with multimodal cues for step height and L weight shifing Side stepping, no AD 2 x 2072f and L with visual feedback on floor. Min assist overall with multimodal cues to prevent compensations through trunk and cues to improve step height  Stepping over 1 inch obstacles 2x 10 with RW and min assist from PT, on first bout leading with the LLE to force HS activation on the R and leading with the RLE on the second bout for improve hip flexor initiation.    Gait training through hall with RW x [id number] min assist overall from PT and moderate cues for improved posture and step height tp prevent foot drag on the R    Pt returned to room and performed stand pivot transfer to bed with CGA and RW. Sit>supine completed with supervision assist with min cues for improved positioning on the EOB, and left supine in bed with call bell in reach and all needs met.         Therapy  Documentation Precautions:  Precautions Precautions: Fall Restrictions Weight Bearing Restrictions: No Pain: Pain Assessment Pain Scale: 0-10 Pain Score: 0-No pain    Therapy/Group: Individual Therapy  AustLorie Phenix3/2021, 10:06 AM

## 2020-01-01 NOTE — Plan of Care (Signed)
  Problem: Consults Goal: RH STROKE PATIENT EDUCATION Description: See Patient Education module for education specifics  Outcome: Progressing   Problem: RH BOWEL ELIMINATION Goal: RH STG MANAGE BOWEL W/MEDICATION W/ASSISTANCE Description: STG Manage Bowel with Medication with Mod IAssistance. Outcome: Progressing   Problem: RH SAFETY Goal: RH STG ADHERE TO SAFETY PRECAUTIONS W/ASSISTANCE/DEVICE Description: STG Adhere to Safety Precautions With Mod IAssistance/Device. Outcome: Progressing Goal: RH STG DECREASED RISK OF FALL WITH ASSISTANCE Description: STG Decreased Risk of Fall With Mod I Assistance. Outcome: Progressing

## 2020-01-02 DIAGNOSIS — I69351 Hemiplegia and hemiparesis following cerebral infarction affecting right dominant side: Secondary | ICD-10-CM | POA: Diagnosis not present

## 2020-01-02 DIAGNOSIS — I10 Essential (primary) hypertension: Secondary | ICD-10-CM | POA: Diagnosis not present

## 2020-01-02 DIAGNOSIS — I6350 Cerebral infarction due to unspecified occlusion or stenosis of unspecified cerebral artery: Secondary | ICD-10-CM | POA: Diagnosis not present

## 2020-01-02 DIAGNOSIS — R7309 Other abnormal glucose: Secondary | ICD-10-CM | POA: Diagnosis not present

## 2020-01-02 LAB — GLUCOSE, CAPILLARY
Glucose-Capillary: 129 mg/dL — ABNORMAL HIGH (ref 70–99)
Glucose-Capillary: 73 mg/dL (ref 70–99)
Glucose-Capillary: 120 mg/dL — ABNORMAL HIGH (ref 70–99)
Glucose-Capillary: 155 mg/dL — ABNORMAL HIGH (ref 70–99)

## 2020-01-02 NOTE — Plan of Care (Signed)
  Problem: Consults Goal: RH STROKE PATIENT EDUCATION Description: See Patient Education module for education specifics  Outcome: Progressing   Problem: RH BOWEL ELIMINATION Goal: RH STG MANAGE BOWEL W/MEDICATION W/ASSISTANCE Description: STG Manage Bowel with Medication with Mod IAssistance. Outcome: Progressing   Problem: RH SAFETY Goal: RH STG ADHERE TO SAFETY PRECAUTIONS W/ASSISTANCE/DEVICE Description: STG Adhere to Safety Precautions With Mod IAssistance/Device. Outcome: Progressing Goal: RH STG DECREASED RISK OF FALL WITH ASSISTANCE Description: STG Decreased Risk of Fall With Mod I Assistance. Outcome: Progressing

## 2020-01-02 NOTE — Progress Notes (Signed)
Patient complained of pain in chest around 01:25. Vital signs collected: BP 126/77, HR 78, RR 16, SpO2 96 on room air, temp 98.3. [employer] interpreter used to further question patient on pain. States he felt the pain in his chest, had trouble breathing, and had to raise his bed to better breathe and be comfortable. Patient complained of pain on chest with palpation. Patient also complained of pain in foot and back. On call provider notified; ordered to give prn pain medicine, as well as heating pad for suspected musculoskeletal pain. Patient given 650mg  tylenol and heating pad. [employer] interpreter used to explain interventions to patient. Patient reported ease of breathing and less pain. Will continue to monitor.

## 2020-01-02 NOTE — Progress Notes (Signed)
[facility] PHYSICAL MEDICINE & REHABILITATION PROGRESS NOTE   Subjective/Complaints:  RN called me early this morning and said that patient was having chest pain, trouble breathing. VSS and chest pain was reproducible with palpation. Improved with tylenol, kpad  ROS: Patient denies fever, rash, sore throat, blurred vision, nausea, vomiting, diarrhea, cough,   headache, or mood change.     Objective:   No results found. No results for input(s): WBC, HGB, HCT, PLT in the last 72 hours. No results for input(s): NA, K, CL, CO2, GLUCOSE, BUN, CREATININE, CALCIUM in the last 72 hours.  Intake/Output Summary (Last 24 hours) at 01/02/2020 1137 Last data filed at 01/02/2020 0709 Gross per 24 hour  Intake 420 ml  Output 125 ml  Net 295 ml     Physical Exam: Vital Signs Blood pressure 123/78, pulse 72, temperature 98.4 F (36.9 C), temperature source Oral, resp. rate 16, height 5\' 4"  (1.626 m), weight 71.3 kg, SpO2 97 %. Constitutional: No distress . Vital signs reviewed. HEENT: EOMI, oral membranes moist Neck: supple Cardiovascular: RRR without murmur. No JVD    Respiratory: CTA Bilaterally without wheezes or rales. Normal effort    GI: BS +, non-tender, non-distended  Skin: Warm and dry.  Intact. Psych: relaxed, not anxious Musc: Right knee effusion on erythema, no skin lesions, pain with ROM   Mild TTP in LB, mild tenderness along pecs.  Neurologic: Alert Motor: 3 - right deltoid bicep tricep finger flexors hip flexors 4 - knee extensor 3 - ankle dorsiflexor left side 5/5---stable exam  Assessment/Plan: 1. 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 which require 3+ hours per day of interdisciplinary therapy in a comprehensive inpatient rehab setting.  Physiatrist is providing close team supervision and 24 hour management of active medical problems listed below.  Physiatrist and rehab team continue to assess barriers to discharge/monitor patient progress  toward functional and medical goals  Care Tool:  Bathing    Body parts bathed by patient: Right arm, Chest, Abdomen, Front perineal area, Right upper leg, Left upper leg, Face, Left arm, Buttocks, Left lower leg   Body parts bathed by helper: Right lower leg Body parts n/a: Right lower leg   Bathing assist Assist Level: Minimal Assistance - Patient > 75%     Upper Body Dressing/Undressing Upper body dressing Upper body dressing/undressing activity did not occur (including orthotics): N/A What is the patient wearing?: Pull over shirt    Upper body assist Assist Level: Set up assist    Lower Body Dressing/Undressing Lower body dressing      What is the patient wearing?: Underwear/pull up, Pants     Lower body assist Assist for lower body dressing: Minimal Assistance - Patient > 75%     Toileting Toileting Toileting Activity did not occur Landscape architect and hygiene only): Refused  Toileting assist Assist for toileting: Minimal Assistance - Patient > 75% Assistive Device Comment: urinal   Transfers Chair/bed transfer  Transfers assist     Chair/bed transfer assist level: Contact Guard/Touching assist     Locomotion Ambulation   Ambulation assist   Ambulation activity did not occur: Safety/medical concerns  Assist level: Minimal Assistance - Patient > 75% Assistive device: Walker-rolling Max distance: 120   Walk 10 feet activity   Assist  Walk 10 feet activity did not occur: Safety/medical concerns  Assist level: Minimal Assistance - Patient > 75% Assistive device: Walker-rolling   Walk 50 feet activity   Assist Walk 50 feet with 2  turns activity did not occur: Safety/medical concerns  Assist level: Minimal Assistance - Patient > 75% Assistive device: Walker-rolling    Walk 150 feet activity   Assist Walk 150 feet activity did not occur: Safety/medical concerns  Assist level: Moderate Assistance - Patient - 50 - 74% Assistive device:  Walker-rolling    Walk 10 feet on uneven surface  activity   Assist Walk 10 feet on uneven surfaces activity did not occur: Safety/medical concerns         Wheelchair     Assist Will patient use wheelchair at discharge?: Yes Type of Wheelchair: Manual    Wheelchair assist level: Minimal Assistance - Patient > 75% Max wheelchair distance: 150    Wheelchair 50 feet with 2 turns activity    Assist        Assist Level: Minimal Assistance - Patient > 75%   Wheelchair 150 feet activity     Assist      Assist Level: Minimal Assistance - Patient > 75%   Blood pressure 123/78, pulse 72, temperature 98.4 F (36.9 C), temperature source Oral, resp. rate 16, height 5\' 4"  (1.626 m), weight 71.3 kg, SpO2 97 %.  Medical Problem List and Plan: 1.  Impaired cognition with delayed recall, balance deficits, left hemiparesis, difficulty with ADLs secondary to acute left parasagittal pons infarct.  Continue CIR PT and OT    2.  Antithrombotics: -DVT/anticoagulation:  Pharmaceutical: Lovenox             -antiplatelet therapy:  ASA daily.  3. Pain Management: Tylenol as needed,  Right knee xray mild tricompartmental OA  Gabapentin 100mg  TID started on 1/15 with?  Improvement  RIght knee joint pain mild OA, monarticular arthritis improved after asp and injection  Low back pain only since hospitalization  -- sports cream    1/23 added Kpad  1/24 chest pain last night was musculoskeletal   -heat, ice, tylenol, reassurance 4. Mood: LCSW to follow for evaluation and support             -antipsychotic agents: N/A 5. Neuropsych: This patient is?  Fully capable of making decisions on his own behalf. 6. Skin/Wound Care: Routine pressure relief measures.  7. Fluids/Electrolytes/Nutrition: Monitor I/O.   8. HTN: Monitor blood pressures 3 times daily.  Resume Prinivil/hctz as indicated as needed.              Vitals:   01/02/20 0128 01/02/20 0450  BP: 126/77 123/78  Pulse: 78  72  Resp: 16 16  Temp: 98.3 F (36.8 C) 98.4 F (36.9 C)  SpO2: 96% 97%   1/24 bp generally well controlled 9.T2DM with hyperglycemia: Metformin on hold due to elevation in creatinine--monitor blood sugars ac/hs and titrate as indicated.              CBG (last 3)  Recent Labs    01/01/20 1707 01/01/20 2006 01/02/20 0623  GLUCAP 133* 150* 129*   Fair control 1/24  Continue Metformin 500 BID -    1/23 improving after steroid injection  10. Dyslipidemia: Pravastatin changed to atorvastatin for aggressive management. 11. LBBB: Follow up with cardiology for [facility] after discharge.  12 COnstipation - adjusted laxative    LOS: 11 days A FACE TO FACE EVALUATION WAS PERFORMED  Meredith Staggers 01/02/2020, 11:37 AM

## 2020-01-03 ENCOUNTER — Inpatient Hospital Stay (HOSPITAL_COMMUNITY): Payer: Medicare Other | Admitting: Physical Therapy

## 2020-01-03 ENCOUNTER — Inpatient Hospital Stay (HOSPITAL_COMMUNITY): Payer: Medicare Other | Admitting: Occupational Therapy

## 2020-01-03 DIAGNOSIS — I6350 Cerebral infarction due to unspecified occlusion or stenosis of unspecified cerebral artery: Secondary | ICD-10-CM | POA: Diagnosis not present

## 2020-01-03 DIAGNOSIS — I69351 Hemiplegia and hemiparesis following cerebral infarction affecting right dominant side: Secondary | ICD-10-CM | POA: Diagnosis not present

## 2020-01-03 LAB — CBC
HCT: 42.5 % (ref 39.0–52.0)
Hemoglobin: 14.6 g/dL (ref 13.0–17.0)
MCH: 30.5 pg (ref 26.0–34.0)
MCHC: 34.4 g/dL (ref 30.0–36.0)
MCV: 88.9 fL (ref 80.0–100.0)
Platelets: 404 10*3/uL — ABNORMAL HIGH (ref 150–400)
RBC: 4.78 MIL/uL (ref 4.22–5.81)
RDW: 12.6 % (ref 11.5–15.5)
WBC: 9.6 10*3/uL (ref 4.0–10.5)
nRBC: 0 % (ref 0.0–0.2)

## 2020-01-03 LAB — BASIC METABOLIC PANEL
Anion gap: 13 (ref 5–15)
BUN: 26 mg/dL — ABNORMAL HIGH (ref 8–23)
CO2: 23 mmol/L (ref 22–32)
Calcium: 9.6 mg/dL (ref 8.9–10.3)
Chloride: 102 mmol/L (ref 98–111)
Creatinine, Ser: 1.06 mg/dL (ref 0.61–1.24)
GFR calc Af Amer: >60 mL/min (ref >60)
GFR calc non Af Amer: >60 mL/min (ref >60)
Glucose, Bld: 177 mg/dL — ABNORMAL HIGH (ref 70–99)
Potassium: 3.9 mmol/L (ref 3.5–5.1)
Sodium: 138 mmol/L (ref 135–145)

## 2020-01-03 LAB — GLUCOSE, CAPILLARY
Glucose-Capillary: 134 mg/dL — ABNORMAL HIGH (ref 70–99)
Glucose-Capillary: 111 mg/dL — ABNORMAL HIGH (ref 70–99)
Glucose-Capillary: 104 mg/dL — ABNORMAL HIGH (ref 70–99)
Glucose-Capillary: 110 mg/dL — ABNORMAL HIGH (ref 70–99)

## 2020-01-03 NOTE — Progress Notes (Signed)
[facility] PHYSICAL MEDICINE & REHABILITATION PROGRESS NOTE   Subjective/Complaints:  Trying to get up without assistance to get to bathroom.  Interpreter in room , explained need to wait for help.  Still a fall risk   ROS: Patient denies fever, rash, sore throat, blurred vision, nausea, vomiting, diarrhea, cough,   headache, or mood change.     Objective:   No results found. Recent Labs    01/03/20 0728  WBC 9.6  HGB 14.6  HCT 42.5  PLT 404*   Recent Labs    01/03/20 0728  NA 138  K 3.9  CL 102  CO2 23  GLUCOSE 177*  BUN 26*  CREATININE 1.06  CALCIUM 9.6    Intake/Output Summary (Last 24 hours) at 01/03/2020 0859 Last data filed at 01/03/2020 0100 Gross per 24 hour  Intake 600 ml  Output 326 ml  Net 274 ml     Physical Exam: Vital Signs Blood pressure 118/79, pulse 75, temperature 98.1 F (36.7 C), temperature source Oral, resp. rate 18, height 5\' 4"  (1.626 m), weight 71.3 kg, SpO2 95 %. Constitutional: No distress . Vital signs reviewed. HEENT: EOMI, oral membranes moist Neck: supple Cardiovascular: RRR without murmur. No JVD    Respiratory: CTA Bilaterally without wheezes or rales. Normal effort    GI: BS +, non-tender, non-distended  Skin: Warm and dry.  Intact. Psych: relaxed, not anxious Musc: Right knee effusion on erythema, no skin lesions, pain with ROM   Mild TTP in LB, mild tenderness along pecs.  Neurologic: Alert Motor: 3 - right deltoid bicep tricep finger flexors hip flexors 4 - knee extensor 3 - ankle dorsiflexor left side 5/5---stable exam  Assessment/Plan: 1. 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 which require 3+ hours per day of interdisciplinary therapy in a comprehensive inpatient rehab setting.  Physiatrist is providing close team supervision and 24 hour management of active medical problems listed below.  Physiatrist and rehab team continue to assess barriers to discharge/monitor patient progress toward  functional and medical goals  Care Tool:  Bathing    Body parts bathed by patient: Right arm, Chest, Abdomen, Front perineal area, Right upper leg, Left upper leg, Face, Left arm, Buttocks, Left lower leg   Body parts bathed by helper: Right lower leg Body parts n/a: Right lower leg   Bathing assist Assist Level: Minimal Assistance - Patient > 75%     Upper Body Dressing/Undressing Upper body dressing Upper body dressing/undressing activity did not occur (including orthotics): N/A What is the patient wearing?: Pull over shirt    Upper body assist Assist Level: Set up assist    Lower Body Dressing/Undressing Lower body dressing      What is the patient wearing?: Underwear/pull up, Pants     Lower body assist Assist for lower body dressing: Minimal Assistance - Patient > 75%     Toileting Toileting Toileting Activity did not occur Landscape architect and hygiene only): Refused  Toileting assist Assist for toileting: Minimal Assistance - Patient > 75% Assistive Device Comment: urinal   Transfers Chair/bed transfer  Transfers assist     Chair/bed transfer assist level: Contact Guard/Touching assist     Locomotion Ambulation   Ambulation assist   Ambulation activity did not occur: Safety/medical concerns  Assist level: Minimal Assistance - Patient > 75% Assistive device: Walker-rolling Max distance: 120   Walk 10 feet activity   Assist  Walk 10 feet activity did not occur: Safety/medical concerns  Assist level:  Minimal Assistance - Patient > 75% Assistive device: Walker-rolling   Walk 50 feet activity   Assist Walk 50 feet with 2 turns activity did not occur: Safety/medical concerns  Assist level: Minimal Assistance - Patient > 75% Assistive device: Walker-rolling    Walk 150 feet activity   Assist Walk 150 feet activity did not occur: Safety/medical concerns  Assist level: Moderate Assistance - Patient - 50 - 74% Assistive device:  Walker-rolling    Walk 10 feet on uneven surface  activity   Assist Walk 10 feet on uneven surfaces activity did not occur: Safety/medical concerns         Wheelchair     Assist Will patient use wheelchair at discharge?: Yes Type of Wheelchair: Manual    Wheelchair assist level: Minimal Assistance - Patient > 75% Max wheelchair distance: 150    Wheelchair 50 feet with 2 turns activity    Assist        Assist Level: Minimal Assistance - Patient > 75%   Wheelchair 150 feet activity     Assist      Assist Level: Minimal Assistance - Patient > 75%   Blood pressure 118/79, pulse 75, temperature 98.1 F (36.7 C), temperature source Oral, resp. rate 18, height 5\' 4"  (1.626 m), weight 71.3 kg, SpO2 95 %.  Medical Problem List and Plan: 1.  Impaired cognition with delayed recall, balance deficits, left hemiparesis, difficulty with ADLs secondary to acute left parasagittal pons infarct.  Continue CIR PT and OT    2.  Antithrombotics: -DVT/anticoagulation:  Pharmaceutical: Lovenox             -antiplatelet therapy:  ASA daily.  3. Pain Management: Tylenol as needed,  Right knee xray mild tricompartmental OA  Gabapentin 100mg  TID started on 1/15 with?  Improvement  RIght knee joint pain mild OA, monarticular arthritis improved after asp and injection  Low back pain only since hospitalization  -- sports cream    1/23 added Kpad  1/24 chest pain last night was musculoskeletal- mild pain to palpation right upper ribs today    -heat, ice, tylenol, reassurance 4. Mood: LCSW to follow for evaluation and support             -antipsychotic agents: N/A 5. Neuropsych: This patient is?  Fully capable of making decisions on his own behalf. 6. Skin/Wound Care: Routine pressure relief measures.  7. Fluids/Electrolytes/Nutrition: Monitor I/O.   8. HTN: Monitor blood pressures 3 times daily.  Resume Prinivil/hctz as indicated as needed.              Vitals:   01/02/20  2014 01/03/20 0521  BP: 131/85 118/79  Pulse: 87 75  Resp: 18 18  Temp: 98.3 F (36.8 C) 98.1 F (36.7 C)  SpO2: 97% 95%   1/24 bp generally well controlled 9.T2DM with hyperglycemia: Metformin on hold due to elevation in creatinine--monitor blood sugars ac/hs and titrate as indicated.              CBG (last 3)  Recent Labs    01/02/20 1642 01/02/20 2127 01/03/20 0609  GLUCAP 120* 155* 134*   Good  control 1/25  Continue Metformin 500 BID -    1/23 improving after steroid injection  10. Dyslipidemia: Pravastatin changed to atorvastatin for aggressive management. 11. LBBB: Follow up with cardiology for [facility] after discharge.  12 COnstipation - adjusted laxative- monitor for diarrhea     LOS: 12 days A FACE TO FACE EVALUATION WAS PERFORMED  Eugene Cooper 01/03/2020, 8:59 AM

## 2020-01-03 NOTE — Progress Notes (Signed)
Occupational Therapy Session Note  Patient Details  Name: Eugene Cooper MRN: [id number] Date of Birth: Dec 28, 1940  Today's Date: 01/03/2020 OT Individual Time: 1400-1500 OT Individual Time Calculation (min): 60 min    Short Term Goals: Week 2:  OT Short Term Goal 1 (Week 2): STGs=LTGs due to ELOS  Skilled Therapeutic Interventions/Progress Updates:    Upon entering the room, pt supine in bed with no c/o pain. Pt agreeable to go to bathroom for toileting needs. Pt performing supine > sit with supervision and ambulating into bathroom with RW 10' with close supervision. Pt able to manage clothing and perform hygiene with close supervision overall. Pt ambulating and standing at sink for hand hygiene. OT assisted pt to day room via wheelchair for time management. Pt engaged in standing task at high low table with focus on R UE NMR with card task. Pt having difficulty flipping cards over, dealing, and palmar translation task with R hand. Pt able to stand for 15 minutes and 8 minutes respectively. OT provided pt with resistive foam cubes for pinch and opposition task for R hand as well. Pt returning demonstrations with min cuing for technique. Pt ambulating with min - mod HHA 100' back to room. Pt returning to bed secondary to fatigue. Bed alarm activated and call bell within reach upon exiting the room.   Therapy Documentation Precautions:  Precautions Precautions: Fall Restrictions Weight Bearing Restrictions: No   Therapy/Group: Individual Therapy  Gypsy Decant 01/03/2020, 4:28 PM

## 2020-01-03 NOTE — Progress Notes (Signed)
Physical Therapy Session Note  Patient Details  Name: Eugene Cooper MRN: [id number] Date of Birth: October 06, 1941  Today's Date: 01/03/2020 PT Individual Time: 0903-1000 PT Individual Time Calculation (min): 57 min   Short Term Goals: Week 2:  PT Short Term Goal 1 (Week 2): STG=LTG due to ELOS  Skilled Therapeutic Interventions/Progress Updates: Pt presented at EOB with NT present preparing to use bathroom. Pt handed over to PTA with pt ambulating to bathroom with CGA and performed toilet transfers with supervision (+void). Pt then ambulated to sink and performed hand hygiene in standing close S. Pt transferred to w/c and propelled to day room with supervision and increased time due to poor obstacle negotiation on R. Performed stand pivot to [employer] and participated in seated and standing activities in Kinetron for R NMR, wt shifting, and general conditioning. Performed seated cycles x 2 min at 70cm/sec. STS x 5 at 70cm/sec with mirror feedback to maintain equal wt shifting and promotion of full RLE extension. Performed standing marching at 50cm/sec 15 cycles x 3 with improved RLE extension and wt shifting to R with repetition. Pt then ambulated 1110f with RW and CGA fading to minA due to decreased R foot clearance however pt was able to correct with verbal cues. Pt then participated in side stepping at wall rail 177fx 2 with verbal cues for R knee extension and clearing R foot when stepping. Pt also ambulated 1528f 2 backwards with minA. Pt then propelled back to room with BLE for hamstring strengthening. Pt performed stand pivot to bed with RW and CGA and transferred to bed. Pt left with bed alarm on, call bell within reach and needs met.      Therapy Documentation Precautions:  Precautions Precautions: Fall Restrictions Weight Bearing Restrictions: No General:   Vital Signs:     Therapy/Group: Individual Therapy  Janyra Barillas  Molley Houser, PTA  01/03/2020, 4:29 PM

## 2020-01-03 NOTE — Plan of Care (Signed)
  Problem: RH Dressing Goal: LTG Patient will perform lower body dressing w/assist (OT) Description: LTG: Patient will perform lower body dressing with assist, with/without cues in positioning using equipment (OT) Flowsheets (Taken 01/03/2020 1210) LTG: Pt will perform lower body dressing with assistance level of: Minimal Assistance - Patient > 75% Note: Goal downgraded because pt still needs A to don Rt sock. He reports his son can provide this assist at d/c

## 2020-01-03 NOTE — Progress Notes (Signed)
Occupational Therapy Session Note  Patient Details  Name: Eugene Cooper MRN: [id number] Date of Birth: 03/17/1941  Today's Date: 01/03/2020 OT Individual Time: [id number] Individual Time Calculation (min): 59 min   Short Term Goals: Week 2:  OT Short Term Goal 1 (Week 2): STGs=LTGs due to ELOS  Skilled Therapeutic Interventions/Progress Updates:    Pt greeted in bed with no c/o pain. Requesting to shower and shave. He completed toileting (using elevated toilet), bathing (seated on TTB), dressing (sit<stand from elevated toilet using RW), and shaving, oral care and grooming tasks (standing at the sink). All functional transfers completed at ambulatory level using RW with supervision assist, min vcs for safe DME mgt during transfers. Min vcs during shower to increase functional use of R UE. Min A during dressing when threading R LE into pants and donning Rt gripper sock. Trialed an adaptive footstool to increase his functional independence with pt still needing balance assist during task. Worked on standing endurance while he completed multiple grooming tasks at the sink, incorporating R UE functionally with min cuing. Mod A for thoroughly shaving face. Pts affect visibly brightened afterwards. He remained in w/c at end of session with all needs within reach and safety belt fastened. Tx focus placed on functional transfers, standing balance, Rt NMR, and ADL retraining.   Interpretor present during session   Therapy Documentation Precautions:  Precautions Precautions: Fall Restrictions Weight Bearing Restrictions: No ADL:        Therapy/Group: Individual Therapy  Taneya Conkel A Saleena Tamas 01/03/2020, 12:44 PM

## 2020-01-04 ENCOUNTER — Inpatient Hospital Stay (HOSPITAL_COMMUNITY): Payer: Medicare Other | Admitting: Physical Therapy

## 2020-01-04 ENCOUNTER — Inpatient Hospital Stay (HOSPITAL_COMMUNITY): Payer: Medicare Other | Admitting: Occupational Therapy

## 2020-01-04 DIAGNOSIS — I69351 Hemiplegia and hemiparesis following cerebral infarction affecting right dominant side: Secondary | ICD-10-CM | POA: Diagnosis not present

## 2020-01-04 LAB — GLUCOSE, CAPILLARY
Glucose-Capillary: 115 mg/dL — ABNORMAL HIGH (ref 70–99)
Glucose-Capillary: 86 mg/dL (ref 70–99)
Glucose-Capillary: 128 mg/dL — ABNORMAL HIGH (ref 70–99)
Glucose-Capillary: 132 mg/dL — ABNORMAL HIGH (ref 70–99)

## 2020-01-04 NOTE — Progress Notes (Signed)
Occupational Therapy Session Note  Patient Details  Name: Eugene Cooper MRN: [id number] Date of Birth: 1941-05-25  Today's Date: 01/04/2020 OT Individual Time: 1100-1200 OT Individual Time Calculation (min): 60 min    Short Term Goals: Week 2:  OT Short Term Goal 1 (Week 2): STGs=LTGs due to ELOS  Skilled Therapeutic Interventions/Progress Updates:    Upon entering the room, pt supine in bed with interpreter present in the room. Pt reports no c/o pain but appears to be laying on k pad and when asked again he reports pain in  R hip and lower back. Pt requesting to use bathroom and ambulates with min HHA into bathroom with supervision for transfer and supervision for clothing management and hygiene. Pt ambulating to sink with RW and supervision. Pt standing for hand hygiene and returning to sit in wheelchair. Pt donning B Shoes with assistance to get R heel into shoe and min guard for balance as pt ties laces. Pt ambulates with RW 150' to ADL apartment with close supervision and min cuing for forward gaze. Pt seated in recliner chair to rest secondary to fatigue. Pt requesting to attempt some activities in room without use of AD. Pt needing increased balance to prevent LOB while attempting functional task of bed making. Pt sitting on EOB and utilize B UEs to fold sheet and place pillowcase on pillow with increased time. OT demonstrated how to utilize RW to carry items and freeing hands for safety to hold onto RW. Pt demonstrating technique to therapist and carried linens to dresser with RW and close supervision. Pt ambulating back to room in same manner with RW but needing min guard secondary to fatigue at end of session with increase in cuing for forward gaze as well. Pt seated in wheelchair with chair alarm belt donned and call bell within reach.   Therapy Documentation Precautions:  Precautions Precautions: Fall Restrictions Weight Bearing Restrictions: No Pain: Pain Assessment Faces  Pain Scale: Hurts a little bit   Therapy/Group: Individual Therapy  Gypsy Decant 01/04/2020, 12:17 PM

## 2020-01-04 NOTE — Progress Notes (Signed)
Physical Therapy Session Note  Patient Details  Name: Eugene Cooper MRN: [id number] Date of Birth: [birth year]  Today's Date: 01/04/2020 PT Individual Time: 0904-1000 and 1410-1508 PT Individual Time Calculation (min): 56 min and 56 min  Short Term Goals: Week 2:  PT Short Term Goal 1 (Week 2): STG=LTG due to ELOS  Skilled Therapeutic Interventions/Progress Updates: Pt presented in bed agreeable to therapy. Pt denies pain initially upon session. Pt performed bed mobility with supervision and use of bed features. PTA donned shoes maxA for time management. Performed ambulatory transfer to w/c close S approx 52f. Pt Pt propelled to rehab gym with BUE and supervision. Pt participated in RLE strengthening activities as follows:  LAQ 2lb cuff 2 x 10 Standing hamstring pulls AROM within available range 2x10 Hip abd/add AROM  2 x 10 Hip flexion standing AROM 2 x 10 STS with LLE on 4in step for increased RLE recruitment 2 x 5 STS no AD on level tile 2 x 5 Bridges 2 x 10 SL bridge RLE 2 x 10  Performed blocked practice supine to/from sit. Pt initially attempted to perform via "sit up", instructed in log roll technique and pt was able to perform supervision from mat both supine to/from sit. Pt ambulated back to room CGA with improved overall R foot clearance although required verbal cues with fatigue but was able to correct. Pt transferred back to bed at end of session and left with bed alarm on, call bell within reach and needs met.   Tx2: Pt presented in bed agreeable to therapy with intreperter present throughout session. Pt stating some pain in R hip but no intervention requested. Performed supine to sit with supervision bed flat with no rails with cues for rolling to side and to push with RUE. Pt requesting to use bathroom, performed ambulatory transfer to bathroom with RW and performed toilet transfers with supervision (+BM). Pt ambulated to sink to perform hand hygiene in standing then  transferred to w/c. Pt transferred to rehab gym and practiced ascending/descending x 8 steps with R rail only in step to pattern. Pt was able to perform with CGA however required mod cues for sequencing. Participated in [employer] LOS on static setting x 2 bouts with use of UE support then x 1 without. Pt required max cues and facilitation for increased wt shifting to R and noted decreased ankle strategy when leaning anteriorly.  Pt then ambulated approx 120fto mat no AD and minA and performed standing dynamic balance on rebounder on both compliant and non-compliant surface x 20. Transitioned to gait training ambulating x 50 ft no AD while kicking yoga block with RLE to promote R knee flexion. Pt then ambulated to day room with RW with CGA and improved flexion during swing through until fatigue. Participated in NuStep L3 x 5 min for global conditioning then pt ambulated back to room with RW CGA. Pt returned to bed at end of session and left with call bell within reach and NT present to check vitals.        Therapy Documentation Precautions:  Precautions Precautions: Fall Restrictions Weight Bearing Restrictions: No General:   Vital Signs:   Pain: Pain Assessment Faces Pain Scale: Hurts a little bit Mobility:   Locomotion :    Trunk/Postural Assessment :    Balance:   Exercises:   Other Treatments:      Therapy/Group: Individual Therapy  Carita Sollars 01/04/2020, 12:13 PM

## 2020-01-04 NOTE — Plan of Care (Signed)
  Problem: Consults Goal: RH STROKE PATIENT EDUCATION Description: See Patient Education module for education specifics  Outcome: Progressing   Problem: RH BOWEL ELIMINATION Goal: RH STG MANAGE BOWEL W/MEDICATION W/ASSISTANCE Description: STG Manage Bowel with Medication with Mod IAssistance. Outcome: Progressing   Problem: RH SAFETY Goal: RH STG ADHERE TO SAFETY PRECAUTIONS W/ASSISTANCE/DEVICE Description: STG Adhere to Safety Precautions With Mod IAssistance/Device. Outcome: Progressing Goal: RH STG DECREASED RISK OF FALL WITH ASSISTANCE Description: STG Decreased Risk of Fall With Mod I Assistance. Outcome: Progressing

## 2020-01-05 ENCOUNTER — Inpatient Hospital Stay (HOSPITAL_COMMUNITY): Payer: Medicare Other | Admitting: Occupational Therapy

## 2020-01-05 ENCOUNTER — Inpatient Hospital Stay (HOSPITAL_COMMUNITY): Payer: Medicare Other | Admitting: Physical Therapy

## 2020-01-05 ENCOUNTER — Inpatient Hospital Stay (HOSPITAL_COMMUNITY): Payer: Medicare Other

## 2020-01-05 DIAGNOSIS — I6350 Cerebral infarction due to unspecified occlusion or stenosis of unspecified cerebral artery: Secondary | ICD-10-CM | POA: Diagnosis not present

## 2020-01-05 DIAGNOSIS — I69351 Hemiplegia and hemiparesis following cerebral infarction affecting right dominant side: Secondary | ICD-10-CM | POA: Diagnosis not present

## 2020-01-05 LAB — GLUCOSE, CAPILLARY
Glucose-Capillary: 128 mg/dL — ABNORMAL HIGH (ref 70–99)
Glucose-Capillary: 91 mg/dL (ref 70–99)
Glucose-Capillary: 135 mg/dL — ABNORMAL HIGH (ref 70–99)
Glucose-Capillary: 141 mg/dL — ABNORMAL HIGH (ref 70–99)

## 2020-01-05 NOTE — Progress Notes (Signed)
[place] PHYSICAL MEDICINE & REHABILITATION PROGRESS NOTE   Subjective/Complaints: Patient seen in physical therapy gym.  Discussed with physical therapy that he will not need an AFO.  Patient feels okay today he is aware of his discharge today.  The Spanish interpreter is in with him.  ROS: Patient denies fever, rash, sore throat, blurred vision, nausea, vomiting, diarrhea, cough,   headache, or mood change.     Objective:   No results found. Recent Labs    01/03/20 0728  WBC 9.6  HGB 14.6  HCT 42.5  PLT 404*   Recent Labs    01/03/20 0728  NA 138  K 3.9  CL 102  CO2 23  GLUCOSE 177*  BUN 26*  CREATININE 1.06  CALCIUM 9.6    Intake/Output Summary (Last 24 hours) at 01/05/2020 0929 Last data filed at 01/05/2020 0900 Gross per 24 hour  Intake 660 ml  Output 150 ml  Net 510 ml     Physical Exam: Vital Signs Blood pressure 124/69, pulse 72, temperature 97.6 F (36.4 C), temperature source Oral, resp. rate 18, height _0  (1.626 m), weight 71.3 kg, SpO2 98 %. Constitutional: No distress . Vital signs reviewed. HEENT: EOMI, oral membranes moist Neck: supple Cardiovascular: RRR without murmur. No JVD    Respiratory: CTA Bilaterally without wheezes or rales. Normal effort    GI: BS +, non-tender, non-distended  Skin: Warm and dry.  Intact. Psych: relaxed, not anxious Musc: Right knee effusion on erythema, no skin lesions, pain with ROM   Mild TTP in LB, mild tenderness along pecs.  Neurologic: Alert Motor: 3 - right deltoid bicep tricep finger flexors hip flexors 4 - knee extensor 3 - ankle dorsiflexor left side 5/5---stable exam  Assessment/Plan: 1. 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 which require 3+ hours per day of interdisciplinary therapy in a comprehensive inpatient rehab setting. Physiatrist is providing close team supervision and 24 hour management of active medical problems listed below. Physiatrist and rehab team continue  to assess barriers to discharge/monitor patient progress toward functional and medical goals  Care Tool:  Bathing    Body parts bathed by patient: Right arm, Chest, Abdomen, Front perineal area, Right upper leg, Left upper leg, Face, Left arm, Buttocks, Left lower leg, Right lower leg   Body parts bathed by helper: Right lower leg Body parts n/a: Right lower leg   Bathing assist Assist Level: Supervision/Verbal cueing     Upper Body Dressing/Undressing Upper body dressing Upper body dressing/undressing activity did not occur (including orthotics): N/A What is the patient wearing?: Pull over shirt    Upper body assist Assist Level: Set up assist    Lower Body Dressing/Undressing Lower body dressing      What is the patient wearing?: Underwear/pull up, Pants     Lower body assist Assist for lower body dressing: Minimal Assistance - Patient > 75%     Toileting Toileting Toileting Activity did not occur Landscape architect and hygiene only): Refused  Toileting assist Assist for toileting: Supervision/Verbal cueing Assistive Device Comment: urinal   Transfers Chair/bed transfer  Transfers assist     Chair/bed transfer assist level: Supervision/Verbal cueing     Locomotion Ambulation   Ambulation assist   Ambulation activity did not occur: Safety/medical concerns  Assist level: Minimal Assistance - Patient > 75% Assistive device: Hand held assist Max distance: 50'   Walk 10 feet activity   Assist  Walk 10 feet activity did not occur: Safety/medical  concerns  Assist level: Minimal Assistance - Patient > 75% Assistive device: Hand held assist   Walk 50 feet activity   Assist Walk 50 feet with 2 turns activity did not occur: Safety/medical concerns  Assist level: Minimal Assistance - Patient > 75% Assistive device: Walker-rolling    Walk 150 feet activity   Assist Walk 150 feet activity did not occur: Safety/medical concerns  Assist level:  Moderate Assistance - Patient - 50 - 74% Assistive device: Walker-rolling    Walk 10 feet on uneven surface  activity   Assist Walk 10 feet on uneven surfaces activity did not occur: Safety/medical concerns         Wheelchair     Assist Will patient use wheelchair at discharge?: Yes Type of Wheelchair: Manual    Wheelchair assist level: Minimal Assistance - Patient > 75% Max wheelchair distance: 150    Wheelchair 50 feet with 2 turns activity    Assist        Assist Level: Minimal Assistance - Patient > 75%   Wheelchair 150 feet activity     Assist      Assist Level: Minimal Assistance - Patient > 75%   Blood pressure 124/69, pulse 72, temperature 97.6 F (36.4 C), temperature source Oral, resp. rate 18, height '5\' 4"'$  (1.626 m), weight 71.3 kg, SpO2 98 %.  Medical Problem List and Plan: 1.  Impaired cognition with delayed recall, balance deficits, left hemiparesis, difficulty with ADLs secondary to acute left parasagittal pons infarct.  Continue CIR PT and OT   Team conference today please see physician documentation under team conference tab, met with team  to discuss problems,progress, and goals. Formulized individual treatment plan based on medical history, underlying problem and comorbidities.  2.  Antithrombotics: -DVT/anticoagulation:  Pharmaceutical: Lovenox             -antiplatelet therapy:  ASA daily.  3. Pain Management: Tylenol as needed,  Right knee xray mild tricompartmental OA  Gabapentin '100mg'$  TID started on 1/15 with?  Improvement  RIght knee joint pain mild OA, monarticular arthritis improved after asp and injection  Low back pain only since hospitalization  -- sports cream    1/23 added Kpad  1/24 chest pain last night was musculoskeletal- mild pain to palpation right upper ribs today    -heat, ice, tylenol, reassurance 4. Mood: LCSW to follow for evaluation and support             -antipsychotic agents: N/A 5. Neuropsych: This  patient is?  Fully capable of making decisions on his own behalf. 6. Skin/Wound Care: Routine pressure relief measures.  7. Fluids/Electrolytes/Nutrition: Monitor I/O.   8. HTN: Monitor blood pressures 3 times daily.  Resume Prinivil/hctz as indicated as needed.              Vitals:   01/04/20 2035 01/05/20 0530  BP: 107/64 124/69  Pulse: 79 72  Resp: 16 18  Temp: 98.2 F (36.8 C) 97.6 F (36.4 C)  SpO2: 97% 98%   1/27 well controlled 9.T2DM with hyperglycemia: Metformin on hold due to elevation in creatinine--monitor blood sugars ac/hs and titrate as indicated.              CBG (last 3)  Recent Labs    01/04/20 1636 01/04/20 2107 01/05/20 0606  GLUCAP 128* 132* 128*   Good  control 1/25  Continue Metformin 500 BID -  Recheck creat in am   1/23 improving after steroid injection  10. Dyslipidemia: Pravastatin  changed to atorvastatin for aggressive management. 11. LBBB: Follow up with cardiology for [facility] after discharge.  12 COnstipation - adjusted laxative- monitor for diarrhea     LOS: 14 days A FACE TO FACE EVALUATION WAS PERFORMED  Charlett Blake 01/05/2020, 9:29 AM

## 2020-01-05 NOTE — Progress Notes (Signed)
Physical Therapy Session Note  Patient Details  Name: Eugene Cooper MRN: [id number] Date of Birth: 02/28/1941  Today's Date: 01/05/2020 PT Individual Time: 0905-1002 PT Individual Time Calculation (min): 57 min   Short Term Goals: Week 2:  PT Short Term Goal 1 (Week 2): STG=LTG due to ELOS  Skilled Therapeutic Interventions/Progress Updates: Pt presented in bed agreeable to therapy. Pt states some mild discomfort in knee but overall states feel better. Performed bed mobility with supervision and PTA donned shoes for time management. Pt indicated need for bathroom and performed ambulatory transfer to bathroom with close supervision. Performed toilet transfers supervision (+void). Pt then ambulated to sink and performed hand hygiene in standing supervision. Pt transported to ortho gym and participated in car transfer from lower and compact SUV level with overall supervision level. Pt then transported to rehab gym and participated in ascending/descending stairs x 8 with CGA with R rail only and improved sequencing from previous day. Pt also participated in obstacle course at "community level" setting including stepping over thresholds, weaving through cones, and gait on uneven surfaces. Pt was CGA with gait on obstacle course. Pt participated on picking up items from floor. Pt was able to pick up cones from ground with CGA. Pt also participated in dynamic balance kicking ball without AD. Pt was overall minA without AD but demonstrated good use of BLE. Pt ambulated back to room with RW and overall supervision level with verbal cues for increasing R knee flexion and toe off vs compensation with R hip hike. Pt agreeable to remain in recliner at end of session and left with seat alarm on, call bell within reach and needs met.      Therapy Documentation Precautions:  Precautions Precautions: Fall Restrictions Weight Bearing Restrictions: No General:   Vital Signs:   Pain:   Mobility:    Locomotion : Gait Ambulation: Yes Gait Assistance: Supervision/Verbal cueing Gait Distance (Feet): 100 Feet Assistive device: Rolling walker Gait Gait: Yes Gait Pattern: Impaired Gait Pattern: Poor foot clearance - right  Trunk/Postural Assessment : Cervical Assessment Cervical Assessment: Within Functional Limits Thoracic Assessment Thoracic Assessment: Exceptions to WFL(rounded shoulders) Lumbar Assessment Lumbar Assessment: Exceptions to WFL(posterior pelvic tilt) Postural Control Postural Control: Within Functional Limits  Balance: Balance Balance Assessed: Yes Static Sitting Balance Static Sitting - Level of Assistance: 6: Modified independent (Device/Increase time) Dynamic Sitting Balance Dynamic Sitting - Balance Support: Feet supported Dynamic Sitting - Level of Assistance: 5: Stand by assistance Dynamic Sitting - Balance Activities: Reaching for objects Static Standing Balance Static Standing - Balance Support: Bilateral upper extremity supported Static Standing - Level of Assistance: 6: Modified independent (Device/Increase time) Dynamic Standing Balance Dynamic Standing - Balance Support: Bilateral upper extremity supported Dynamic Standing - Level of Assistance: 5: Stand by assistance Dynamic Standing - Balance Activities: Reaching for objects;Kicking ball Exercises:   Other Treatments:      Therapy/Group: Individual Therapy  Jase Reep 01/05/2020, 12:38 PM

## 2020-01-05 NOTE — Patient Care Conference (Signed)
Inpatient RehabilitationTeam Conference and Plan of Care Update Date: 01/05/2020   Time: 10:05 AM    Patient Name: Eugene Cooper      Medical Record Number: [id number]  Date of Birth: Feb 10, 1941 Sex: Male         Room/Bed: 4W16C/4W16C-01 Payor Info: Payor: MEDICARE / Plan: MEDICARE PART B / Product Type: *No Product type* /    Admit Date/Time:  12/22/2019  1:07 PM  Primary Diagnosis:  Left pontine stroke [facility])  Patient Active Problem List   Diagnosis Date Noted  . Elevated BUN   . Benign essential HTN   . Controlled type 2 diabetes mellitus with hyperglycemia, without long-term current use of insulin ([place])   . Labile blood glucose   . Neuropathic pain   . Acute ischemic stroke ([place]) 12/22/2019  . Uncontrolled type 2 diabetes mellitus with hyperglycemia, without long-term current use of insulin ([place]) 12/22/2019  . Mixed hyperlipidemia 12/22/2019  . Left pontine stroke ([place]) 12/22/2019  . Essential hypertension   . Left bundle branch block   . Stroke ([place]) 12/20/2019  . Right sided weakness 12/19/2019    Expected Discharge Date: Expected Discharge Date: 01/07/20  Team Members Present: Physician leading conference: Dr. Alysia Penna Social Worker Present: Lennart Pall, LCSW Nurse Present: Dorien Chihuahua, Bertram Savin, RN Case Manager: Karene Fry, RN PT Present: Phylliss Bob, PTA;Leavy Cella, PT OT Present: Darleen Crocker, OT SLP Present: Jettie Booze, CF-SLP PPS Coordinator present : Gunnar Fusi, Novella Olive, PT     Current Status/Progress Goal Weekly Team Focus  Bowel/Bladder   continent of b/b; LBM: 01/26  Remains continent  assist with toileting needs QS and prn   Swallow/Nutrition/ Hydration             ADL's   Supervision bathing while sitting on TTB, setup UB dressing, Min A LB dressing, Supervision toileting + bathroom transfers while ambulating with RW  supervision overall, lower body dressing downgraded to [place] education, d/c  planning, NMR   Mobility   SBA bed mobility, SBA transfer, CGA gait 119ft fading to minA with fatigue due to decreased L foot clearance  Supervision assist  R NMT, gait, balance, RLE strengthening   Communication             Safety/Cognition/ Behavioral Observations            Pain   denies pain  remain pain free  assess pain QS and prn   Skin   skin intact  maintain skin integrity  assess skin QS and prn    Rehab Goals Patient on target to meet rehab goals: Yes *See Care Plan and progress notes for long and short-term goals.     Barriers to Discharge  Current Status/Progress Possible Resolutions Date Resolved   Nursing                  PT                    OT                  SLP                SW     Plan to discharge home with family who can provide 24/7 assistance          Discharge Planning/Teaching Needs:  Home with daughter and extended family who will provide 24/7 care  Family education set up with an interpreter for 01/06/20 @  1300   Team Discussion: Monitoring kidney function, checking labs, knee effusion/OA.  RN BM daily, knee pain better.  OT S goals, at goal level with RW, baseline memory deficits, fam ed tom.  PT goal level S RW, fam ed tom.  SLP none.   Revisions to Treatment Plan: N/A     Medical Summary Current Status: Creat down but monitoring on Metformin, CBG improving after steroid injection Weekly Focus/Goal: D/C training  Barriers to Discharge: Medical stability   Possible Resolutions to Barriers: family ed, recheck blood work   Continued Need for Acute Rehabilitation Level of Care: The patient requires daily medical management by a physician with specialized training in physical medicine and rehabilitation for the following reasons: Direction of a multidisciplinary physical rehabilitation program to maximize functional independence : Yes Medical management of patient stability for increased activity during participation in an intensive  rehabilitation regime.: Yes Analysis of laboratory values and/or radiology reports with any subsequent need for medication adjustment and/or medical intervention. : Yes   I attest that I was present, lead the team conference, and concur with the assessment and plan of the team.   Eugene Cooper 01/05/2020, 2:30 PM   Team conference was held via web/ teleconference due to [place] - 19

## 2020-01-05 NOTE — Progress Notes (Signed)
Physical Therapy Discharge Summary  Patient Details  Name: Eugene Cooper MRN: [id number] Date of Birth: 12/12/1940  Today's Date: 01/06/2020   Patient has met 9 of 9 long term goals due to improved activity tolerance, improved balance, improved postural control, increased strength, increased range of motion, functional use of  right upper extremity and right lower extremity, improved awareness and improved coordination.  Patient to discharge at an ambulatory level Supervision.   Patient's care partner is independent to provide the necessary physical assistance at discharge.  Reasons goals not met: All PT goals met   Recommendation:  Patient will benefit from ongoing skilled PT services in home health setting to continue to advance safe functional mobility, address ongoing impairments in balance, strength, coordination, endurance, gait, transfers, and minimize fall risk.  Equipment: RW, WC.   Reasons for discharge: treatment goals met and discharge from hospital  Patient/family agrees with progress made and goals achieved: Yes  PT Discharge Precautions/Restrictions Precautions Precautions: Fall Restrictions Weight Bearing Restrictions: No Pain Pain Assessment Pain Scale: 0-10 Pain Score: 0-No pain  Cognition Overall Cognitive Status: Within Functional Limits for tasks assessed Arousal/Alertness: Awake/alert Orientation Level: Oriented X4 Sensation Sensation Light Touch: Appears Intact Proprioception: Appears Intact Coordination Gross Motor Movements are Fluid and Coordinated: No Fine Motor Movements are Fluid and Coordinated: No Coordination and Movement Description: mild dysmetria 2/2 strength deficits Motor  Motor Motor: Hemiplegia Motor - Discharge Observations: R hemi and ataxia but greatly improved since evaluation  Mobility Bed Mobility Bed Mobility: Rolling Right;Rolling Left;Supine to Sit;Sit to Supine Rolling Right: Independent Rolling Left:  Independent Supine to Sit: Independent with assistive device Sit to Supine: Supervision/Verbal cueing Transfers Transfers: Sit to Stand;Stand Pivot Transfers Sit to Stand: Supervision/Verbal cueing Stand Pivot Transfers: Supervision/Verbal cueing Transfer (Assistive device): Rolling walker Locomotion  Gait Ambulation: Yes Gait Assistance: Supervision/Verbal cueing Gait Distance (Feet): 150 Feet Assistive device: Rolling walker Gait Gait: Yes Gait Pattern: Impaired Gait Pattern: Poor foot clearance - right;Decreased hip/knee flexion - right Stairs / Additional Locomotion Stairs: Yes Stairs Assistance: Supervision/Verbal cueing Stair Management Technique: Two rails Number of Stairs: 12 Height of Stairs: 6 Wheelchair Mobility Wheelchair Mobility: Yes Wheelchair Assistance: Chartered loss adjuster: Both upper extremities Wheelchair Parts Management: Supervision/cueing Distance: 150  Trunk/Postural Assessment  Cervical Assessment Cervical Assessment: Within Functional Limits Thoracic Assessment Thoracic Assessment: Exceptions to WFL(rounded shoulders) Lumbar Assessment Lumbar Assessment: Exceptions to WFL(posterior pelvic tilt) Postural Control Postural Control: Within Functional Limits  Balance Balance Balance Assessed: Yes Standardized Balance Assessment Standardized Balance Assessment: Berg Balance Test Berg Balance Test Sit to Stand: Able to stand  independently using hands Standing Unsupported: Able to stand 2 minutes with supervision Sitting with Back Unsupported but Feet Supported on Floor or Stool: Able to sit safely and securely 2 minutes Stand to Sit: Controls descent by using hands Transfers: Able to transfer safely, definite need of hands Standing Unsupported with Eyes Closed: Able to stand 10 seconds with supervision Standing Ubsupported with Feet Together: Able to place feet together independently and stand for 1 minute with  supervision From Standing, Reach Forward with Outstretched Arm: Can reach forward >5 cm safely (2") From Standing Position, Pick up Object from Floor: Able to pick up shoe, needs supervision From Standing Position, Turn to Look Behind Over each Shoulder: Turn sideways only but maintains balance Turn 360 Degrees: Needs assistance while turning Standing Unsupported, Alternately Place Feet on Step/Stool: Needs assistance to keep from falling or unable to try Standing Unsupported, One Foot in Front: Able to  take small step independently and hold 30 seconds Standing on One Leg: Unable to try or needs assist to prevent fall Total Score: 31 Static Sitting Balance Static Sitting - Level of Assistance: 6: Modified independent (Device/Increase time) Dynamic Sitting Balance Dynamic Sitting - Balance Support: Feet supported Dynamic Sitting - Level of Assistance: 6: Modified independent (Device/Increase time) Dynamic Sitting - Balance Activities: Reaching for objects Static Standing Balance Static Standing - Balance Support: Bilateral upper extremity supported Static Standing - Level of Assistance: 5: Stand by assistance Dynamic Standing Balance Dynamic Standing - Balance Support: Bilateral upper extremity supported Dynamic Standing - Level of Assistance: 5: Stand by assistance Extremity Assessment      RLE Assessment RLE Assessment: Exceptions to [facility] General Strength Comments: grossly 4-/5 proxmal to distal LLE Assessment LLE Assessment: Within Functional Limits    Rosita DeChalus 01/05/2020, 10:34 AM

## 2020-01-05 NOTE — Progress Notes (Signed)
Recreational Therapy Session Note  Patient Details  Name: Eugene Cooper MRN: [id number] Date of Birth: 06/08/1941 Today's Date: 01/05/2020  Pain: no c/o Skilled Therapeutic Interventions/Progress Updates: Session focused on community reintegration skills during co-treat with PT.  Pt ambulated through an obstacle course navigating on, around and over various obstacles, up/down a curb step and over uneven surfaces with RW.  Pt required contact guard assist.  Amila Callies 01/05/2020, 9:50 AM

## 2020-01-05 NOTE — Progress Notes (Signed)
Physical Therapy Session Note  Patient Details  Name: Eugene Cooper MRN: [id number] Date of Birth: 1941-10-06  Today's Date: 01/05/2020 PT Individual Time: 1100-1200 PT Individual Time Calculation (min): 60 min   Short Term Goals: Week 1:  PT Short Term Goal 1 (Week 1): Pt will performed bed mobility with min assist PT Short Term Goal 1 - Progress (Week 1): Met PT Short Term Goal 2 (Week 1): Pt will transfer to [facility] with min assist and LRAD consistently PT Short Term Goal 2 - Progress (Week 1): Met PT Short Term Goal 3 (Week 1): Pt will ambulate 86f with mod assist and LRAD PT Short Term Goal 3 - Progress (Week 1): Met PT Short Term Goal 4 (Week 1): Pt will initiate stair management training PT Short Term Goal 4 - Progress (Week 1): Met Week 2:  PT Short Term Goal 1 (Week 2): STG=LTG due to ELOS Week 3:     Skilled Therapeutic Interventions/Progress Updates:  Interpreter utilized for communication during ths session.   PAIN Denies pain, does state L hip fatigue w/therex due to muscle fatigue , relieved w/rest.  Pt initially OOB in recliner and agreeable to treatment session with focus on  Balance, hip strength, gait.    STS from recliner w/supervision to RW.  Gait 1511fto gym w/RW and cga, one episode of decreased clearance LLE thru swing.  Cues for forward gaze.  Pt transferred to mat w/supervision w/RW. In seated performecd hip abd w/GTB resistance 2 x15 Sidestepping length of mat x 2 each direction w/GTB resistance, repeated x2 Repeated STS from mat w/GTB resistance to abd w/transition x 10 Repeated STS as above but w/red disk under L foot to promote increased wbing and use of RLE w/task. Repeated 10 reps x 2  Standing balance activities: Standing w/feet together - maintains x 20 sec w/cga and minimal sway Repeated above w/eyes closed w/moderate increase in sway and eventual mild balance loss to L and intact reactions RLE, min assist for recovery. Standing stagger step eyes  open/closed w/gradual increased tendency to to drift to L, performed alternating arm raises R/L and L/R (not tandem/but stagger) w/cg Standing heel raises x 10 w/cga only Standing alternating tapping 5 in step requires min assist for balance, difficuty coordinating RLE w/this task.   Gait 15046fym to room w/Rw/cga, increased L lean noted w/increased incidence of Decreased clearance on L, appears fatigued.  Turn sit transfer to recliner w/RW and verbal cues for safety, cga w/RW.  Pt removes shoes independently.  STS from recliner and spt to bed w/cga.  Sit to supine mod I.  Scoots w/cues to use RUE w/all other exts, no physical assist.  Pt left supine w/rails up x 3, alarm set, bed in lowest position, and needs in reach.   Therapy Documentation Precautions:  Precautions Precautions: Fall Restrictions Weight Bearing Restrictions: No    Therapy/Group: Individual Therapy  BarCallie FieldingT Bernie27/2021, 1:49 PM

## 2020-01-05 NOTE — Progress Notes (Signed)
Occupational Therapy Session Note  Patient Details  Name: Eugene Cooper MRN: [id number] Date of Birth: Jun 06, 1941  Today's Date: 01/05/2020 OT Individual Time: 1400-1500 OT Individual Time Calculation (min): 60 min    Short Term Goals: Week 2:  OT Short Term Goal 1 (Week 2): STGs=LTGs due to ELOS  Skilled Therapeutic Interventions/Progress Updates:    Upon entering the room, pt supine in bed with no c/o pain this session. Interpreter present. Pt verbalized need to use bathroom and ambulating with RW into bathroom and performed toileting needs with supervision. Pt is agreeable to shower and obtained clothing items from dresser with min cuing to place over RW to free hands for safety. Pt ambulating into bathroom and doffing clothing items while seated on TTB. Pt bathing at shower level while seated with close supervision for safety. Pt attempting to don underwear while seated on TTB but unable to thread onto R foot. Pt ambulating to sit onto bed and instructed on how to partially circle sit in order to increase independence with task. Pt returning demonstration and able to don pants and B socks with supervision! Pt obtaining dirty clothing items and placing into bag while standing at closet as well with close supervision. Pt standing at sink for 12 minutes to shave face and brush teeth with supervision. Pt returning to bed at end of session with call bell and all needed items within reach and bed alarm activated.   Therapy Documentation Precautions:  Precautions Precautions: Fall Restrictions Weight Bearing Restrictions: No Vital Signs: Therapy Vitals Temp: 98.2 F (36.8 C) Temp Source: Oral Pulse Rate: 87 Resp: 16 BP: 105/76 Patient Position (if appropriate): Lying Oxygen Therapy SpO2: 95 % O2 Device: Room Air   Therapy/Group: Individual Therapy  Gypsy Decant 01/05/2020, 4:15 PM

## 2020-01-06 ENCOUNTER — Encounter (HOSPITAL_COMMUNITY): Payer: Medicare Other | Admitting: Occupational Therapy

## 2020-01-06 ENCOUNTER — Ambulatory Visit (HOSPITAL_COMMUNITY): Payer: Medicare Other | Admitting: Physical Therapy

## 2020-01-06 ENCOUNTER — Inpatient Hospital Stay (HOSPITAL_COMMUNITY): Payer: Medicare Other | Admitting: Physical Therapy

## 2020-01-06 DIAGNOSIS — I69351 Hemiplegia and hemiparesis following cerebral infarction affecting right dominant side: Secondary | ICD-10-CM | POA: Diagnosis not present

## 2020-01-06 LAB — BASIC METABOLIC PANEL
Anion gap: 14 (ref 5–15)
BUN: 26 mg/dL — ABNORMAL HIGH (ref 8–23)
CO2: 24 mmol/L (ref 22–32)
Calcium: 9.4 mg/dL (ref 8.9–10.3)
Chloride: 101 mmol/L (ref 98–111)
Creatinine, Ser: 1.08 mg/dL (ref 0.61–1.24)
GFR calc Af Amer: >60 mL/min (ref >60)
GFR calc non Af Amer: >60 mL/min (ref >60)
Glucose, Bld: 124 mg/dL — ABNORMAL HIGH (ref 70–99)
Potassium: 4.2 mmol/L (ref 3.5–5.1)
Sodium: 139 mmol/L (ref 135–145)

## 2020-01-06 LAB — GLUCOSE, CAPILLARY
Glucose-Capillary: 125 mg/dL — ABNORMAL HIGH (ref 70–99)
Glucose-Capillary: 106 mg/dL — ABNORMAL HIGH (ref 70–99)
Glucose-Capillary: 154 mg/dL — ABNORMAL HIGH (ref 70–99)
Glucose-Capillary: 159 mg/dL — ABNORMAL HIGH (ref 70–99)

## 2020-01-06 NOTE — Progress Notes (Signed)
Team Conference Report to Patient/Family  Team Conference discussion was reviewed with the patient and daughter in law, including goals, any changes in plan of care and target discharge date 01/07/20.  Patient and caregiver express understanding and are in agreement.  The patient has family education today at 1pm.  Equipment was ordered and delivered for discharge.  Eugene Cooper B 01/06/2020, 2:18 PM

## 2020-01-06 NOTE — Progress Notes (Signed)
Physical Therapy Session Note  Patient Details  Name: Eugene Cooper MRN: [id number] Date of Birth: 1941-01-10  Today's Date: 01/06/2020 PT Individual Time: 0900-1000 ANS 1400-1455 PT Individual Time Calculation (min): 60 min and 55 min   Short Term Goals: Week 2:  PT Short Term Goal 1 (Week 2): STG=LTG due to ELOS  Skilled Therapeutic Interventions/Progress Updates:   Session 1  Pt received supine in bed and agreeable to PT. Supine>sit transfer without assist or cues on the L side of the bed.    Gait training with RW to bathroom with supervision assist and mincues for AD management over threshold. Additional gait training with RW in hall x 123f with supervision assist and mincues for safety in turns and attention to task.  Dynamic gait training with RW up/down ramp and over unlevel surface with supervision assist and only min cues for proper AD use on.off unlevel surface. Also performed over level surface without AD x 542fand min assist, increasing foot drag on the R side with fatigue, but able to correct with tactile and verbal cues.   Stair training. With BUE support on rails x 12 with supervision assist with on ly min cues for step to gait pattern leading with soud LE in ascent and descent with affected LE.   Car transfer training with Supervision assist and RW. Min cues for RLE management and sequencing to improve safety and success.    PT instructed pt in modified [place] with hand out provided for HEP. Min cues for safety and set up, as well as use of proper UE support as need to prevent LOB.   Pt returned to room and performed ambulatory transfer to bed with RW from bathroom following continent bowel movement with supervision assist. Sit>supine completed with supervision assist, and left supine in bed with call bell in reach and all needs met.    Session 2.   Pt received supine in bed and agreeable to PT. Son present for family education. Supine>sit transfer with out  assist or cues.     Stair management up/down 6 inch curb step x 2 and up/down 8 inch curb step x 2 with UE supported on RW. CGA from 6 inch and min A from 8iWhitakerCues for pt and son on use of step to gait pattern and proper AD placement to prevent LOB. Car transfer with supervision assist and RW  with cues to son for safe set up and facilitation to the RLE when fatigued. Dynamic gait training with RW up/down ramp and over mulch.     Patient demonstrates increased fall risk as noted by score of 31/56 on Berg Balance Scale.  (<36= high risk for falls, close to 100%; 37-45 significant >80%; 46-51 moderate >50%; 52-55 lower >25%)  Gait training back to room with son providing CGA fading to Supervision assist due to fatigue and increased foot drag. Cues to son for awareness of foot drag and how to instruct pt in improved postural control to improve step height. Patient returned to room and left sitting EOB with son present,call bell in reach and all needs met.          Therapy Documentation Precautions:  Precautions Precautions: Fall Restrictions Weight Bearing Restrictions: No Pain: denies      Therapy/Group: Individual Therapy  AuLorie Phenix/28/2021, 10:00 AM

## 2020-01-06 NOTE — Progress Notes (Signed)
Occupational Therapy Discharge Summary  Patient Details  Name: Eugene Cooper MRN: [id number] Date of Birth: September 18, 1941  Today's Date: 01/06/2020 OT Individual Time: 1300-1400 OT Individual Time Calculation (min): 60 min    Patient has met 12 of 12 long term goals due to improved activity tolerance, improved balance, postural control, ability to compensate for deficits, functional use of  RIGHT upper and RIGHT lower extremity, improved attention, improved awareness and improved coordination.  Patient to discharge at overall Supervision level.  Patient's care partner is independent to provide the necessary supervision assistance at discharge.    Reasons goals not met: all goals met  Recommendation:  Patient will benefit from ongoing skilled OT services in home health setting to continue to advance functional skills in the area of BADL and iADL.  Equipment: TTB and 3 in1 commode chair  Reasons for discharge: treatment goals met  Patient/family agrees with progress made and goals achieved: Yes   OT Intervention: Upon entering the room, pt supine in bed with interpreter present and son, Jacqulyn Bath, present for hands on family education. OT educating pt and caregiver on pt's goals and level of assist at this time. Caregiver providing supervision and providing appropriate cuing while ambulating 150' with RW to tub room. Pt demonstrated transfer into tub shower on TTB with use of RW. OT educating caregiver on fall risks with bathing and OT answering questions. Pt ambulating to ADL apartment with RW and demonstrating transfer onto furniture and standard bed with supervision. Pt returning back to room in same manner and seated on EOB to demonstrate [facility] HEP with use of theraputty and HEP provided. OT reviewing discharge plans for tomorrow and caregiver reports no further questions at this time.   OT Discharge Precautions/Restrictions  Precautions Precautions: Fall Restrictions Weight Bearing  Restrictions: No Pain Pain Assessment Pain Scale: 0-10 Pain Score: 0-No pain Vision Baseline Vision/History: No visual deficits Patient Visual Report: No change from baseline Vision Assessment?: No apparent visual deficits Cognition Overall Cognitive Status: Within Functional Limits for tasks assessed Arousal/Alertness: Awake/alert Orientation Level: Oriented X4 Sensation Coordination Gross Motor Movements are Fluid and Coordinated: Yes Fine Motor Movements are Fluid and Coordinated: Yes Coordination and Movement Description: decreased dexterity and speed but functional at this time Motor  Motor Motor: Hemiplegia Motor - Discharge Observations: R hemi and ataxia but improved since evaluation Mobility  Bed Mobility Bed Mobility: Rolling Right;Rolling Left;Supine to Sit;Sit to Supine Rolling Right: Independent Rolling Left: Independent Supine to Sit: Supervision/Verbal cueing Sit to Supine: Supervision/Verbal cueing Transfers Sit to Stand: Supervision/Verbal cueing  Trunk/Postural Assessment  Cervical Assessment Cervical Assessment: Within Functional Limits Thoracic Assessment Thoracic Assessment: Exceptions to WFL(rounded shoulders) Lumbar Assessment Lumbar Assessment: Exceptions to WFL(posterior pelvic tilt) Postural Control Postural Control: Within Functional Limits  Balance Balance Balance Assessed: Yes Static Sitting Balance Static Sitting - Level of Assistance: 6: Modified independent (Device/Increase time) Dynamic Sitting Balance Dynamic Sitting - Balance Support: Feet supported Dynamic Sitting - Level of Assistance: 6: Modified independent (Device/Increase time) Static Standing Balance Static Standing - Balance Support: Bilateral upper extremity supported Static Standing - Level of Assistance: 5: Stand by assistance Dynamic Standing Balance Dynamic Standing - Balance Support: Bilateral upper extremity supported Dynamic Standing - Level of Assistance: 5:  Stand by assistance Extremity/Trunk Assessment RUE Assessment RUE Assessment: Within Functional Limits Passive Range of Motion (PROM) Comments: WFLs Active Range of Motion (AROM) Comments: WFLs General Strength Comments: 3+/5 - functional LUE Assessment LUE Assessment: Within Functional Limits   Gypsy Decant 01/06/2020, 12:46  PM

## 2020-01-07 DIAGNOSIS — I69351 Hemiplegia and hemiparesis following cerebral infarction affecting right dominant side: Secondary | ICD-10-CM | POA: Diagnosis not present

## 2020-01-07 DIAGNOSIS — I6350 Cerebral infarction due to unspecified occlusion or stenosis of unspecified cerebral artery: Secondary | ICD-10-CM | POA: Diagnosis not present

## 2020-01-07 LAB — GLUCOSE, CAPILLARY: Glucose-Capillary: 118 mg/dL — ABNORMAL HIGH (ref 70–99)

## 2020-01-07 MED ORDER — ACETAMINOPHEN 325 MG PO TABS: 325.0000 mg | ORAL_TABLET | ORAL | Status: AC | PRN

## 2020-01-07 MED ORDER — ADULT MULTIVITAMIN W/MINERALS CH: 1.0000 | ORAL_TABLET | Freq: Every day | ORAL | Status: DC

## 2020-01-07 MED ORDER — GABAPENTIN 100 MG PO CAPS: 100.0000 mg | ORAL_CAPSULE | Freq: Three times a day (TID) | ORAL | 0 refills | Status: DC

## 2020-01-07 MED ORDER — ASPIRIN 81 MG PO TBEC: 162.0000 mg | DELAYED_RELEASE_TABLET | Freq: Every day | ORAL | 0 refills | Status: DC

## 2020-01-07 MED ORDER — MUSCLE RUB 10-15 % EX CREA: 1.0000 "application " | TOPICAL_CREAM | Freq: Two times a day (BID) | CUTANEOUS | 0 refills | Status: DC | PRN

## 2020-01-07 MED ORDER — SENNOSIDES-DOCUSATE SODIUM 8.6-50 MG PO TABS: 2.0000 | ORAL_TABLET | Freq: Two times a day (BID) | ORAL | 0 refills | Status: DC

## 2020-01-07 MED ORDER — METFORMIN HCL 500 MG PO TABS: 500.0000 mg | ORAL_TABLET | Freq: Two times a day (BID) | ORAL | 0 refills | Status: DC

## 2020-01-07 MED ORDER — BLOOD GLUCOSE MONITOR KIT: PACK | 0 refills | Status: AC

## 2020-01-07 MED ORDER — ATORVASTATIN CALCIUM 40 MG PO TABS: 40.0000 mg | ORAL_TABLET | Freq: Every day | ORAL | 0 refills | Status: AC

## 2020-01-07 NOTE — Progress Notes (Signed)
Patient has given permission for MD office to contact granddaughter Jani [id number] or Daughter in law Ranell Patrick (speaks limited [place])  [phone] for information/appts

## 2020-01-07 NOTE — Discharge Instructions (Signed)
Inpatient Rehab Discharge Instructions  Eugene Cooper Discharge date and time:  01/07/20  Activities/Precautions/ Functional Status: Activity: no lifting, driving, or strenuous exercise till cleared by MD Diet: cardiac diet and diabetic diet Wound Care: none needed   Functional status:  ___ No restrictions     ___ Walk up steps independently _X__ 24/7 supervision/assistance   ___ Walk up steps with assistance ___ Intermittent supervision/assistance  ___ Bathe/dress independently ___ Walk with walker     ___ Bathe/dress with assistance ___ Walk Independently    ___ Shower independently ___ Walk with assistance    _X__ Shower with assistance _X__ No alcohol     ___ Return to work/school ________    COMMUNITY REFERRALS UPON DISCHARGE:    Home Health:   PT     OT                       Agency: [facility] Phone:  [phone]    Medical Equipment/Items Ordered: wheelchair, cushion, walker, tub bench and commode                                                     Agency/Supplier:  [facility] @ [phone]      Special Instructions: 1. Check blood sugars twice a day--follow up with PCP next week for further adjustment of medications.   STROKE/TIA DISCHARGE INSTRUCTIONS SMOKING Cigarette smoking nearly doubles your risk of having a stroke & is the single most alterable risk factor  If you smoke or have smoked in the last 12 months, you are advised to quit smoking for your health.  Most of the excess cardiovascular risk related to smoking disappears within a year of stopping.  Ask you doctor about anti-smoking medications  [place] Quit Line: 1-800-QUIT NOW  Free Smoking Cessation Classes [phone]  CHOLESTEROL Know your levels; limit fat & cholesterol in your diet  Lipid Panel     Component Value Date/Time   CHOL 258 (H) 12/20/2019 0639   TRIG 267 (H) 12/20/2019 0639   HDL 36 (L) 12/20/2019 0639   CHOLHDL 7.2 12/20/2019 0639   VLDL 53 (H) 12/20/2019  0639   LDLCALC 169 (H) 12/20/2019 [id number]      Many patients benefit from treatment even if their cholesterol is at goal.  Goal: Total Cholesterol (CHOL) less than 160  Goal:  Triglycerides (TRIG) less than 150  Goal:  HDL greater than 40  Goal:  LDL (LDLCALC) less than 100   BLOOD PRESSURE American Stroke Association blood pressure target is less that 120/80 mm/Hg  Your discharge blood pressure is:  BP: 106/81  Monitor your blood pressure  Limit your salt and alcohol intake  Many individuals will require more than one medication for high blood pressure  DIABETES (A1c is a blood sugar average for last 3 months) Goal HGBA1c is under 7% (HBGA1c is blood sugar average for last 3 months)  Diabetes:     Lab Results  Component Value Date   HGBA1C 8.2 (H) 12/20/2019     Your HGBA1c can be lowered with medications, healthy diet, and exercise.  Check your blood sugar as directed by your physician  Call your physician if you experience unexplained or low blood sugars.  PHYSICAL ACTIVITY/REHABILITATION Goal is 30 minutes at least 4 days per week  Activity: No  driving, Therapies: see above Return to work: N/A  Activity decreases your risk of heart attack and stroke and makes your heart stronger.  It helps control your weight and blood pressure; helps you relax and can improve your mood.  Participate in a regular exercise program.  Talk with your doctor about the best form of exercise for you (dancing, walking, swimming, cycling).  DIET/WEIGHT Goal is to maintain a healthy weight  Your discharge diet is:  Diet Order            Diet heart healthy/carb modified Room service appropriate? Yes; Fluid consistency: Thin  Diet effective now            liquids Your height is:  Height: 5\' 4"  (162.6 cm) Your current weight is: Weight: 71.3 kg Your Body Mass Index (BMI) is:  BMI (Calculated): 26.97  Following the type of diet specifically designed for you will help prevent another  stroke.  Your goal weight is:    Your goal Body Mass Index (BMI) is 19-24.  Healthy food habits can help reduce 3 risk factors for stroke:  High cholesterol, hypertension, and excess weight.  RESOURCES Stroke/Support Group:  Call [phone]   STROKE EDUCATION PROVIDED/REVIEWED AND GIVEN TO PATIENT Stroke warning signs and symptoms How to activate emergency medical system (call 911). Medications prescribed at discharge. Need for follow-up after discharge. Personal risk factors for stroke. Pneumonia vaccine given:  Flu vaccine given:  My questions have been answered, the writing is legible, and I understand these instructions.  I will adhere to these goals & educational materials that have been provided to me after my discharge from the hospital.     My questions have been answered and I understand these instructions. I will adhere to these goals and the provided educational materials after my discharge from the hospital.  Patient/Caregiver Signature _______________________________ Date __________  Clinician Signature _______________________________________ Date __________  Please bring this form and your medication list with you to all your follow-up doctor's appointments.

## 2020-01-07 NOTE — Care Management (Signed)
   The overall goal for the admission was met for:   Discharge location: Home  Length of Stay: 16 days with discharge 01/07/20  Discharge activity level: Supervision overall  Home/community participation:Wheelchair level  Services provided included: MD, RD, PT, OT, SLP, RN, CM, Pharmacy, Neuropsych and SW Financial Services: Medicare and Medicaid  Follow-up services arranged: Home Health: [facility] PT, OT, DME: [facility]air, Walker, Tub bench, 3 n 1 and Patient/Family has no preference for HH/DME agencies  Comments (or additional information): [facility]: [phone]  Patient/Family verbalized understanding of follow-up arrangements: Yes  Family education completed 01/06/20 with son Jose'  Individual responsible for coordination of the follow-up plan: Jose Cooper: [phone] Eugene Cooper: [phone]  Confirmed correct DME delivered: ,  B 01/07/2020    ,  B 

## 2020-01-07 NOTE — Progress Notes (Signed)
Recreational Therapy Discharge Summary Patient Details  Name: Eugene Cooper MRN: [id number] Date of Birth: [birth year] Today's Date: 01/07/2020  Long term goals set: 1  Long term goals met: 1  Comments on progress toward goals: Pt was referred to TR services with an emphasis on community skills.  Pt states that he enjoys gardening and walking in his neighborhood.   TR session focused on activity tolerance, safety awareness & functional mobility ambulating throughout a simulated obstacle course with contact guard assist, min instructional cues.  Goal met.  Pt is scheduled for discharge home today with family to provide supervision/assistance.  Reasons for discharge: discharge from hospital  Patient/family agrees with progress made and goals achieved: Yes  [name] 01/07/2020, 8:27 AM

## 2020-01-07 NOTE — Progress Notes (Signed)
[facility] PHYSICAL MEDICINE & REHABILITATION PROGRESS NOTE   Subjective/Complaints: No complaint, a little pain RIght knee   ROS: Patient denies fever, rash, sore throat, blurred vision, nausea, vomiting, diarrhea, cough,   headache, or mood change.     Objective:   No results found. No results for input(s): WBC, HGB, HCT, PLT in the last 72 hours. Recent Labs    01/06/20 0520  NA 139  K 4.2  CL 101  CO2 24  GLUCOSE 124*  BUN 26*  CREATININE 1.08  CALCIUM 9.4    Intake/Output Summary (Last 24 hours) at 01/07/2020 0915 Last data filed at 01/07/2020 0725 Gross per 24 hour  Intake 1080 ml  Output 1020 ml  Net 60 ml     Physical Exam: Vital Signs Blood pressure 111/74, pulse 76, temperature 98.1 F (36.7 C), resp. rate 17, height 5\' 4"  (1.626 m), weight 71.3 kg, SpO2 97 %. Constitutional: No distress . Vital signs reviewed. HEENT: EOMI, oral membranes moist Neck: supple Cardiovascular: RRR without murmur. No JVD    Respiratory: CTA Bilaterally without wheezes or rales. Normal effort    GI: BS +, non-tender, non-distended  Skin: Warm and dry.  Intact. Psych: relaxed, not anxious Musc: R Knee minimal tenderness along jt line   No effusion Neurologic: Alert Motor: 3 - right deltoid bicep tricep finger flexors hip flexors 4 - knee extensor 3 - ankle dorsiflexor left side 5/5---stable exam  Assessment/Plan: 1. 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 Stable for D/C today F/u PCP in 3-4 weeks F/u PM&R 2 weeks See D/C summary See D/C instructions Care Tool:  Bathing    Body parts bathed by patient: Right arm, Chest, Abdomen, Front perineal area, Right upper leg, Left upper leg, Face, Left arm, Buttocks, Left lower leg, Right lower leg   Body parts bathed by helper: Right lower leg Body parts n/a: Right lower leg   Bathing assist Assist Level: Supervision/Verbal cueing     Upper Body Dressing/Undressing Upper body dressing Upper body  dressing/undressing activity did not occur (including orthotics): N/A What is the patient wearing?: Pull over shirt    Upper body assist Assist Level: Supervision/Verbal cueing    Lower Body Dressing/Undressing Lower body dressing      What is the patient wearing?: Underwear/pull up, Pants     Lower body assist Assist for lower body dressing: Supervision/Verbal cueing     Toileting Toileting Toileting Activity did not occur (Clothing management and hygiene only): Refused  Toileting assist Assist for toileting: Supervision/Verbal cueing Assistive Device Comment: urinal   Transfers Chair/bed transfer  Transfers assist     Chair/bed transfer assist level: Supervision/Verbal cueing     Locomotion Ambulation   Ambulation assist   Ambulation activity did not occur: Safety/medical concerns  Assist level: Supervision/Verbal cueing Assistive device: Walker-rolling Max distance: 150   Walk 10 feet activity   Assist  Walk 10 feet activity did not occur: Safety/medical concerns  Assist level: Supervision/Verbal cueing Assistive device: Walker-rolling   Walk 50 feet activity   Assist Walk 50 feet with 2 turns activity did not occur: Safety/medical concerns  Assist level: Supervision/Verbal cueing Assistive device: Walker-rolling    Walk 150 feet activity   Assist Walk 150 feet activity did not occur: Safety/medical concerns  Assist level: Supervision/Verbal cueing Assistive device: Walker-rolling    Walk 10 feet on uneven surface  activity   Assist Walk 10 feet on uneven surfaces activity did not occur: Safety/medical concerns  Assist level: Contact Guard/Touching assist Assistive device: Aeronautical engineer Will patient use wheelchair at discharge?: Yes Type of Wheelchair: Manual    Wheelchair assist level: Supervision/Verbal cueing Max wheelchair distance: 150    Wheelchair 50 feet with 2 turns  activity    Assist        Assist Level: Supervision/Verbal cueing   Wheelchair 150 feet activity     Assist      Assist Level: Supervision/Verbal cueing   Blood pressure 111/74, pulse 76, temperature 98.1 F (36.7 C), resp. rate 17, height 5\' 4"  (1.626 m), weight 71.3 kg, SpO2 97 %.  Medical Problem List and Plan: 1.  Impaired cognition with delayed recall, balance deficits, left hemiparesis, difficulty with ADLs secondary to acute left parasagittal pons infarct.  D/c home today  2.  Antithrombotics: -DVT/anticoagulation:  Pharmaceutical: Lovenox             -antiplatelet therapy:  ASA daily.  3. Pain Management: Tylenol as needed,  Right knee xray mild tricompartmental OA  Gabapentin 100mg  TID started on 1/15 with?  Improvement  RIght knee joint pain mild OA, monarticular arthritis improved after asp and injection  Low back pain only since hospitalization  -- sports cream    1/23 added Kpad  1/24 chest pain last night was musculoskeletal- mild pain to palpation right upper ribs today    -heat, ice, tylenol, reassurance 4. Mood: LCSW to follow for evaluation and support             -antipsychotic agents: N/A 5. Neuropsych: This patient is?  Fully capable of making decisions on his own behalf. 6. Skin/Wound Care: Routine pressure relief measures.  7. Fluids/Electrolytes/Nutrition: Monitor I/O.   8. HTN: Monitor blood pressures 3 times daily.  Resume Prinivil/hctz as indicated as needed.              Vitals:   01/06/20 2130 01/07/20 0549  BP:  111/74  Pulse:  76  Resp: 17 17  Temp:  98.1 F (36.7 C)  SpO2:  97%   1/29 well controlled- f/u PCP no meds at present  9.T2DM with hyperglycemia: Metformin on hold due to elevation in creatinine--monitor blood sugars ac/hs and titrate as indicated.              CBG (last 3)  Recent Labs    01/06/20 1638 01/06/20 2109 01/07/20 0605  GLUCAP 154* 159* 118*   Good  control 1/29  Continue Metformin 500 BID - creat  stable / normal 1/28  1/23 improving after steroid injection  10. Dyslipidemia: Pravastatin changed to atorvastatin for aggressive management. 11. LBBB: Follow up with cardiology for [facility] after discharge.  12 COnstipation - adjusted laxative- monitor for diarrhea     LOS: 16 days A FACE TO FACE EVALUATION WAS PERFORMED  Charlett Blake 01/07/2020, 9:15 AM

## 2020-01-07 NOTE — Discharge Summary (Signed)
Physician Discharge Summary  Patient ID: Eugene Cooper MRN: [id number] DOB/AGE: [birth year] 79 y.o.  Admit date: 12/22/2019 Discharge date: 01/07/2020  Discharge Diagnoses:  Principal Problem:   Left pontine stroke [facility]) Active Problems:   Controlled type 2 diabetes mellitus with hyperglycemia, without long-term current use of insulin (HCC)   Labile blood glucose   Neuropathic pain   Elevated BUN   Benign essential HTN    Discharged Condition:  Stable   Significant Diagnostic Studies: N/A   Labs:  Basic Metabolic Panel:  BMP Latest Ref Rng & Units 01/06/2020 01/03/2020 12/27/2019  Glucose 70 - 99 mg/dL 124(H) 177(H) 174(H)  BUN 8 - 23 mg/dL 26(H) 26(H) 22  Creatinine 0.61 - 1.24 mg/dL 1.08 1.06 1.02  Sodium 135 - 145 mmol/L 139 138 138  Potassium 3.5 - 5.1 mmol/L 4.2 3.9 4.1  Chloride 98 - 111 mmol/L 101 102 102  CO2 22 - 32 mmol/L 24 23 24   Calcium 8.9 - 10.3 mg/dL 9.4 9.6 9.1    CBC: CBC Latest Ref Rng & Units 01/03/2020 12/27/2019 12/23/2019  WBC 4.0 - 10.5 K/uL 9.6 8.3 9.2  Hemoglobin 13.0 - 17.0 g/dL 14.6 13.3 14.5  Hematocrit 39.0 - 52.0 % 42.5 38.3(L) 41.8  Platelets 150 - 400 K/uL 404(H) 319 307    CBG: Recent Labs  Lab 01/06/20 0632 01/06/20 1137 01/06/20 1638 01/06/20 2109 01/07/20 0605  GLUCAP 125* 106* 154* 159* 118*    Brief HPI:   Eugene Cooper is a 79 y.o. male with history of HTN, T2DM, dyslipidemia who was admitted on 01/10/21with right sided weakness and difficulty walking. He also repoted N/V/D and abdominal pain with body aches for 2-3 days duration PTA. Respiratory panel  Negative and CT abdomen/pelvis showed hepatic steatosis without acute abnormality. MRI brain showed acuted left parasagittal pontine infarct and MRA brain negative for stenosis or dissection. 2D echo showed EF 55-60% with mild LVH. EKG showed L-GGG and Dr. Harl Bowie recommended Jeremy Johann on outpatient basis for   ASA recommended for secondary stroke prevention.     Hospital  Course: Eugene Cooper was admitted to rehab 12/22/2019 for inpatient therapies to consist of PT and OT at least three hours five days a week. Past admission physiatrist, therapy team and rehab RN have worked together to provide customized collaborative inpatient rehab. He was matinee on ASA for secondary stroke prevention. Follow up CBC showed that H/H/[place] to be stable. Follow up lytes showed pre-renal azotemia and he has been encouraged to increase fluid intake.  Gabapentin was added to help manage dysesthesias RUE/RLE. He reported right knee pain which was affecting mobility and local measures not effective. 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 Right knee effusion was aspirated on 1/18 with  20 ml blood tinged fluid aspirated followed by 6 ml betamethasone by Dr. Letta Pate.   His blood pressures were monitored on TID basis and have been stable off medications. His blood sugars have been monitored with ac/hs CBG checks and SSI was use prn for tighter BS control. Metformin was resumed at 500 mg bid and BS have been reasonably controlled.  Bowel program has been augmented to Senna S 2 pills bid to help manage constipation. Patient Regis Bill advised to taper this off if bowels normalize once back in familiar setting. He is continent of bowel and bladder. He has made gains during rehab stay and is currently at supervision level. He will continue to receive follow up HHPT and [facility] after  discharge   Rehab course: During patient's stay in rehab weekly team conferences were held to monitor patient's progress, set goals and discuss barriers to discharge. At admission, patient required max assist with basic ADL tasks and mod assist with mobility. He has had improvement in activity tolerance, balance, postural control as well as ability to compensate for deficits. He has had improvement in functional use RUE and RLE as well as improvement in  awareness. He is able to complete ADL tasks with supervision. He requires supervision for transfers and to ambulate 150' with RW and tactile cues for safety.   Discharge disposition: 01-Home or Self Care  Diet: Heart Healthy. Carb modified medium.  Special Instructions: 1. Monitor BS ac/hs and follow up with PCP for further adjustment.    Discharge Instructions    Ambulatory referral to Cardiology   Complete by: As directed    Needs work up/Lexiscan   Ambulatory referral to Neurology   Complete by: As directed    An appointment is requested in approximately: 3-4 weeks. Stroke follow up   Ambulatory referral to Physical Medicine Rehab   Complete by: As directed    1-2 weeks TC appt     Allergies as of 01/07/2020   No Known Allergies     Medication List    STOP taking these medications   feeding supplement (GLUCERNA SHAKE) Liqd   glipiZIDE 5 MG tablet Commonly known as: GLUCOTROL   lisinopril 5 MG tablet Commonly known as: ZESTRIL     TAKE these medications   acetaminophen 325 MG tablet Commonly known as: TYLENOL Take 1-2 tablets (325-650 mg total) by mouth every 4 (four) hours as needed for mild pain.   aspirin 81 MG EC tablet Take 2 tablets (162 mg total) by mouth daily. Notes to patient: Have to buy this over the counter   atorvastatin 40 MG tablet Commonly known as: LIPITOR Take 1 tablet (40 mg total) by mouth daily at 6 PM.   blood glucose meter kit and supplies Kit Dispense based on patient and insurance preference. Use up to four times daily as directed. (FOR ICD-9 250.00, 250.01).   gabapentin 100 MG capsule Commonly known as: NEURONTIN Take 1 capsule (100 mg total) by mouth 3 (three) times daily.   metFORMIN 500 MG tablet Commonly known as: GLUCOPHAGE Take 1 tablet (500 mg total) by mouth 2 (two) times daily with a meal. What changed:   medication strength  how much to take   multivitamin with minerals Tabs tablet Take 1 tablet by mouth  daily. Notes to patient: Buy this over the counter   Muscle Rub 10-15 % Crea Apply 1 application topically 2 (two) times daily as needed for muscle pain. To lower back   senna-docusate 8.6-50 MG tablet Commonly known as: Senokot-S Take 2 tablets by mouth 2 (two) times daily. Purchase over the counter Notes to patient: Buy this over the counter      Follow-up Information    Kirsteins, Luanna Salk, MD Follow up.   Specialty: Physical Medicine and Rehabilitation Why: Office will call you with follow up appointment  Contact information: [place] [address] [phone]        [facility] Follow up.   Specialty: Neurology Why: office will call you with follow up appointment Contact information: [address] [place] [phone]       Emelia Loron, NP Follow up on 01/11/2020.   Specialty: Nurse Practitioner Why: Appointment  3:15 pm/Be there by 3 pm Contact information: [place] [address] [phone]        [facility] Follow up.   Specialty: Cardiology Why: Office will call yoiu with follow up appointment Contact information: [address], [place] [address] [phone]          Signed: Bary Leriche 01/07/2020, 11:44 AM

## 2020-01-11 ENCOUNTER — Telehealth: Payer: Self-pay

## 2020-01-11 NOTE — Telephone Encounter (Signed)
Transitional Care call--Eugene Cooper   1. Are you/is patient experiencing any problems since coming home? No Are there any questions regarding any aspect of care? No 2. Are there any questions regarding medications administration/dosing? No Are meds being taken as prescribed? Yes Patient should review meds with caller to confirm 3. Have there been any falls? No 4. Has Home Health been to the house and/or have they contacted you? Yes If not, have you tried to contact them?  Can we help you contact them? 5. Are bowels and bladder emptying properly? Yes Are there any unexpected incontinence issues? No If applicable, is patient following bowel/bladder programs? 6. Any fevers, problems with breathing, unexpected pain? No 7. Are there any skin problems or new areas of breakdown? No 8. Has the patient/family member arranged specialty MD follow up (ie cardiology/neurology/renal/surgical/etc)? Yes  Can we help arrange? 9. Does the patient need any other services or support that we can help arrange? No 10. Are caregivers following through as expected in assisting the patient? Yes 11. Has the patient quit smoking, drinking alcohol, or using drugs as recommended? Yes  Appointment time 2:30 pm, arrive time 2:15 pm with Dr. Letta Pate on 01/20/2020 [place]

## 2020-01-17 NOTE — Progress Notes (Signed)
Cardiology Office Note    Date:  01/18/2020   ID:  Eugene Cooper, DOB 04-30-1941, MRN [id number]  PCP:  Emelia Loron, NP  Cardiologist: No primary care provider on file. EPS: None  No chief complaint on file.   History of Present Illness:  Eugene Cooper is a 79 y.o. male with history of hypertension, hyperlipidemia, DM type II, CKD stage III who was admitted with CVA and found to have a new LBBB on EKG.  Troponins were negative and he had no chest pain.  Patient is Spanish-speaking and difficult historian.  He is a non-smoker and has no family history of CAD.  2D echo showed normal LVEF without significant wall motion abnormalities.  Plan was to follow-up as an outpatient and likely schedule Lexiscan once he is recovered from his stroke.  Patient comes in accompanied by his grand daughter. Walking with walker. Says his chest feels tired when he walks ever since his stroke. Also complains of knee and back pain. Thinks it's from using the walker. Patient saw PCP with [facility] and they scheduled stress test in [facility] for next week.    Past Medical History:  Diagnosis Date  . Hyperlipemia   . Hypertension     No past surgical history on file.  Current Medications: Current Meds  Medication Sig  . acetaminophen (TYLENOL) 325 MG tablet Take 1-2 tablets (325-650 mg total) by mouth every 4 (four) hours as needed for mild pain.  Marland Kitchen aspirin 81 MG EC tablet Take 2 tablets (162 mg total) by mouth daily.  Marland Kitchen atorvastatin (LIPITOR) 40 MG tablet Take 1 tablet (40 mg total) by mouth daily at 6 PM.  . blood glucose meter kit and supplies KIT Dispense based on patient and insurance preference. Use up to four times daily as directed. (FOR ICD-9 250.00, 250.01).  Marland Kitchen gabapentin (NEURONTIN) 100 MG capsule Take 1 capsule (100 mg total) by mouth 3 (three) times daily.  . Menthol-Methyl Salicylate (MUSCLE RUB) 10-15 % CREA Apply 1 application topically 2 (two) times daily as needed for muscle  pain. To lower back  . metFORMIN (GLUCOPHAGE) 500 MG tablet Take 1 tablet (500 mg total) by mouth 2 (two) times daily with a meal.  . Multiple Vitamin (MULTIVITAMIN WITH MINERALS) TABS tablet Take 1 tablet by mouth daily.  Marland Kitchen senna-docusate (SENOKOT-S) 8.6-50 MG tablet Take 2 tablets by mouth 2 (two) times daily. Purchase over the counter     Allergies:   Patient has no known allergies.   Social History   Socioeconomic History  . Marital status: Single    Spouse name: Not on file  . Number of children: Not on file  . Years of education: Not on file  . Highest education level: Not on file  Occupational History  . Not on file  Tobacco Use  . Smoking status: Never Smoker  . Smokeless tobacco: Never Used  Substance and Sexual Activity  . Alcohol use: Not Currently  . Drug use: Never  . Sexual activity: Not on file  Other Topics Concern  . Not on file  Social History Narrative  . Not on file   Social Determinants of Health   Financial Resource Strain:   . Difficulty of Paying Living Expenses: Not on file  Food Insecurity:   . Worried About Charity fundraiser in the Last Year: Not on file  . Ran Out of Food in the Last Year: Not on file  Transportation Needs:   . Lack of Transportation (  Medical): Not on file  . Lack of Transportation (Non-Medical): Not on file  Physical Activity:   . Days of Exercise per Week: Not on file  . Minutes of Exercise per Session: Not on file  Stress:   . Feeling of Stress : Not on file  Social Connections:   . Frequency of Communication with Friends and Family: Not on file  . Frequency of Social Gatherings with Friends and Family: Not on file  . Attends Religious Services: Not on file  . Active Member of Clubs or Organizations: Not on file  . Attends Archivist Meetings: Not on file  . Marital Status: Not on file     Family History:  The patient's   family history is not on file. no family history of CAD  ROS:   Please see the  history of present illness.    ROS All other systems reviewed and are negative.   PHYSICAL EXAM:   VS:  BP 110/66   Pulse 91   Ht 5' 4"  (1.626 m)   Wt 157 lb (71.2 kg)   SpO2 96%   BMI 26.95 kg/m   Physical Exam  GEN: Well nourished, well developed, in no acute distress  Neck: no JVD, carotid bruits, or masses Cardiac:RRR; no murmurs, rubs, or gallops  Respiratory:  clear to auscultation bilaterally, normal work of breathing GI: soft, nontender, nondistended, + BS Ext: without cyanosis, clubbing, or edema, Good distal pulses bilaterally Neuro:  Alert and Oriented x 3 Psych: euthymic mood, full affect   Wt Readings from Last 3 Encounters:  01/18/20 157 lb (71.2 kg)  12/31/19 157 lb 3 oz (71.3 kg)  12/19/19 153 lb 7 oz (69.6 kg)      Studies/Labs Reviewed:   EKG:  EKG is not ordered today.    Recent Labs: 12/23/2019: ALT 55 01/03/2020: Hemoglobin 14.6; Platelets 404 01/06/2020: BUN 26; Creatinine, Ser 1.08; Potassium 4.2; Sodium 139   Lipid Panel    Component Value Date/Time   CHOL 258 (H) 12/20/2019 0639   TRIG 267 (H) 12/20/2019 0639   HDL 36 (L) 12/20/2019 0639   CHOLHDL 7.2 12/20/2019 0639   VLDL 53 (H) 12/20/2019 0639   LDLCALC 169 (H) 12/20/2019 1308    Additional studies/ records that were reviewed today include:  2D echo 12/20/2019  IMPRESSIONS     1. Left ventricular ejection fraction, by visual estimation, is 55 to  60%. The left ventricle has normal function. There is mildly increased  left ventricular hypertrophy.   2. Left ventricular diastolic parameters are consistent with Grade I  diastolic dysfunction (impaired relaxation).   3. The left ventricle has no regional wall motion abnormalities.   4. Global right ventricle has normal systolic function.The right  ventricular size is normal. No increase in right ventricular wall  thickness.   5. Left atrial size was normal.   6. Right atrial size was normal.   7. Presence of pericardial fat pad.    8. Mild mitral annular calcification.   9. The mitral valve is grossly normal. No evidence of mitral valve  regurgitation.  10. The tricuspid valve is grossly normal.  11. The aortic valve is tricuspid. Aortic valve regurgitation is not  visualized.  12. The pulmonic valve was grossly normal. Pulmonic valve regurgitation is  not visualized.  13. Aortic dilatation noted.  14. 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  15. Mildly elevated pulmonary artery systolic pressure.  16. The tricuspid regurgitant velocity is 2.61  m/s, and with an assumed  right atrial pressure of 3 mmHg, the estimated right ventricular systolic  pressure is mildly elevated at 30.2 mmHg.  17. The inferior vena cava is normal in size with greater than 50%  respiratory variability, suggesting right atrial pressure of 3 mmHg.      ASSESSMENT:    1. LBBB (left bundle branch block)   2. Cerebrovascular accident (CVA), unspecified mechanism ([place])   3. Essential hypertension   4. Mixed hyperlipidemia   5. Stage 3 chronic kidney disease, unspecified whether stage 3a or 3b CKD      PLAN:  In order of problems listed above:  New LBBB on EKG with normal LV function and no wall motion abnormality on echo 12/20/2019.  Patient is very difficult historian.  Complains of fatigue and chest back and knee ever since his stroke and walking with a walker.  No definitive chest tightness or pressure.  No family history of CAD.  He saw PCP with [facility] last week who scheduled L stress test in [facility].  Family and patient would like to stay local and only lives 5 minutes away.  We will schedule Lexiscan Myoview here and let them cancel that.   Acute left pontine CVA aspirin.  Echo without evidence of thrombi or PFO.  Carotid imaging no stenosis.  Will place 30-day monitor.  No evidence of A. fib in the hospital.  Essential hypertension well-controlled off meds.  He was previously on lisinopril HCTZ 20/25 mg. Hyperlipidemia LDL  169 triglycerides 267 now on Lipitor 40 mg daily.  Will need fasting lipid panel in 3 months.  CKD stage III creatinine 1.08 at discharge  DM type II followed by PCP     Medication Adjustments/Labs and Tests Ordered: Current medicines are reviewed at length with the patient today.  Concerns regarding medicines are outlined above.  Medication changes, Labs and Tests ordered today are listed in the Patient Instructions below. There are no Patient Instructions on file for this visit.   Sumner Boast, PA-C  01/18/2020 12:32 PM    [facility] [address], [place], [place]  [address] Phone: [phone]; Fax: [phone]

## 2020-01-18 ENCOUNTER — Other Ambulatory Visit: Payer: Self-pay

## 2020-01-18 ENCOUNTER — Ambulatory Visit (INDEPENDENT_AMBULATORY_CARE_PROVIDER_SITE_OTHER): Payer: Medicare Other | Admitting: Physician Assistant

## 2020-01-18 ENCOUNTER — Telehealth: Payer: Self-pay | Admitting: *Deleted

## 2020-01-18 ENCOUNTER — Encounter: Payer: Self-pay | Admitting: Physician Assistant

## 2020-01-18 VITALS — BP 110/66 | HR 91 | Ht 64.0 in | Wt 157.0 lb

## 2020-01-18 DIAGNOSIS — I639 Cerebral infarction, unspecified: Secondary | ICD-10-CM | POA: Diagnosis not present

## 2020-01-18 DIAGNOSIS — E782 Mixed hyperlipidemia: Secondary | ICD-10-CM | POA: Diagnosis not present

## 2020-01-18 DIAGNOSIS — I10 Essential (primary) hypertension: Secondary | ICD-10-CM | POA: Diagnosis not present

## 2020-01-18 DIAGNOSIS — I447 Left bundle-branch block, unspecified: Secondary | ICD-10-CM

## 2020-01-18 DIAGNOSIS — N183 Chronic kidney disease, stage 3 unspecified: Secondary | ICD-10-CM

## 2020-01-18 NOTE — Patient Instructions (Signed)
Medication Instructions:  Your physician recommends that you continue on your current medications as directed. Please refer to the Current Medication list given to you today.  *If you need a refill on your cardiac medications before your next appointment, please call your pharmacy*  Lab Work: Your physician recommends that you return for a FASTING lipid profile and liver function panel in 3 months  If you have labs (blood work) drawn today and your tests are completely normal, you will receive your results only by: Marland Kitchen MyChart Message (if you have MyChart) OR . A paper copy in the mail If you have any lab test that is abnormal or we need to change your treatment, we will call you to review the results.  Testing/Procedures: Your physician has requested that you have a lexiscan myoview. For further information please visit [email or web address]. Please follow instruction sheet, as given.  Your physician has recommended that you wear an event monitor. Event monitors are medical devices that record the heart's electrical activity. Doctors most often [place] these monitors to diagnose arrhythmias. Arrhythmias are problems with the speed or rhythm of the heartbeat. The monitor is a small, portable device. You can wear one while you do your normal daily activities. This is usually used to diagnose what is causing palpitations/syncope (passing out).  Follow-Up: At [facility], you and your health needs are our priority.  As part of our continuing mission to provide you with exceptional heart care, we have created designated Provider Care Teams.  These Care Teams include your primary Cardiologist (physician) and Advanced Practice Providers (APPs -  Physician Assistants and Nurse Practitioners) who all work together to provide you with the care you need, when you need it.  Your next appointment:   2 month(s)  The format for your next appointment:   In Person  Provider:   Casandra Doffing, MD (establish  care)  Other Instructions

## 2020-01-18 NOTE — Telephone Encounter (Signed)
Message left on voicemail using [employer] [id number]. Patient enrolled for [employer] to ship a 30 day cardiac event monitor to his home.  Review instructions , apply monitor, call [employer] at [phone], ask for Spanish interpretor to confirm monitor is transmitting,

## 2020-01-20 ENCOUNTER — Other Ambulatory Visit: Payer: Self-pay

## 2020-01-20 ENCOUNTER — Encounter: Payer: Medicare Other | Attending: Physical Medicine & Rehabilitation | Admitting: Physical Medicine & Rehabilitation

## 2020-01-20 ENCOUNTER — Encounter: Payer: Self-pay | Admitting: Physical Medicine & Rehabilitation

## 2020-01-20 VITALS — BP 134/82 | HR 80 | Temp 97.3°F | Ht 62.5 in | Wt 158.0 lb

## 2020-01-20 DIAGNOSIS — R269 Unspecified abnormalities of gait and mobility: Secondary | ICD-10-CM | POA: Insufficient documentation

## 2020-01-20 DIAGNOSIS — I69398 Other sequelae of cerebral infarction: Secondary | ICD-10-CM | POA: Diagnosis present

## 2020-01-20 DIAGNOSIS — I6350 Cerebral infarction due to unspecified occlusion or stenosis of unspecified cerebral artery: Secondary | ICD-10-CM | POA: Insufficient documentation

## 2020-01-20 DIAGNOSIS — I639 Cerebral infarction, unspecified: Secondary | ICD-10-CM

## 2020-01-20 MED ORDER — TOPIRAMATE 25 MG PO TABS: 25.0000 mg | ORAL_TABLET | Freq: Two times a day (BID) | ORAL | 1 refills | Status: DC

## 2020-01-20 NOTE — Progress Notes (Signed)
Subjective:    Patient ID: Eugene Cooper, male    DOB: 1941-08-19, 79 y.o.   MRN: [id number]    79 y.o. male with history of HTN, T2DM, dyslipidemia who was admitted on 01/10/21with right sided weakness and difficulty walking. He also repoted N/V/D and abdominal pain with body aches for 2-3 days duration PTA. Respiratory panel  Negative and CT abdomen/pelvis showed hepatic steatosis without acute abnormality. MRI brain showed acuted left parasagittal pontine infarct and MRA brain negative for stenosis or dissection. 2D echo showed EF 55-60% with mild LVH. EKG showed L-BBB and Dr. Harl Bowie recommended Jeremy Johann on outpatient basis for   ASA recommended for secondary stroke prevention.    Metformin was resumed at 500 mg bid and BS have been reasonably controlled. Bowel program has been augmented to Senna S 2 pills bid to help manage constipation.  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 Right knee effusion was aspirated on 1/18 with 20 ml blood tinged fluid aspirated followed by 6 ml betamethasone by Dr. Letta Pate. Admit date: 12/22/2019 Discharge date: 01/07/2020  HPI   Patient is here with Spanish interpreter as well as his granddaughter.  His daughter granddaughter reports that things are going okay at home.  Patient states that he has several symptoms as listed below.  Cardiologist recommends stress test.which has been scheduled  RIght Knee pain worsened since D/C from hospital  HA pain- intermittent but daily  Congestion runny nose, no fever no cough  Asking whether he received Covid vaccine in the hospital.  Discussed that patient needs to contact PCP to help arrange this.  His stroke is not a contraindication.  Ambulation getting a little better, uses a walker  Pain Inventory Average Pain 4 Pain Right Now 3 My pain is constant and dull  In the last 24 hours, has pain interfered with the following? General activity 3 Relation with others  3 Enjoyment of life 3 What TIME of day is your pain at its worst? all Sleep (in general) Fair  Pain is worse with: walking and laying  Pain improves with: therapy/exercise, pacing activities and medication Relief from Meds: 3  Mobility use a walker how many minutes can you walk? 2-3 ability to climb steps?  yes do you drive?  no  Function retired I need assistance with the following:  household duties  Neuro/Psych weakness trouble walking dizziness  Prior Studies Any changes since last visit?  no  Physicians involved in your care Any changes since last visit?  no   No family history on file. Social History   Socioeconomic History  . Marital status: Single    Spouse name: Not on file  . Number of children: Not on file  . Years of education: Not on file  . Highest education level: Not on file  Occupational History  . Not on file  Tobacco Use  . Smoking status: Never Smoker  . Smokeless tobacco: Never Used  Substance and Sexual Activity  . Alcohol use: Not Currently  . Drug use: Never  . Sexual activity: Not on file  Other Topics Concern  . Not on file  Social History Narrative  . Not on file   Social Determinants of Health   Financial Resource Strain:   . Difficulty of Paying Living Expenses: Not on file  Food Insecurity:   . Worried About Charity fundraiser in the Last Year: Not on file  . Ran Out of Food in the Last Year: Not  on file  Transportation Needs:   . Lack of Transportation (Medical): Not on file  . Lack of Transportation (Non-Medical): Not on file  Physical Activity:   . Days of Exercise per Week: Not on file  . Minutes of Exercise per Session: Not on file  Stress:   . Feeling of Stress : Not on file  Social Connections:   . Frequency of Communication with Friends and Family: Not on file  . Frequency of Social Gatherings with Friends and Family: Not on file  . Attends Religious Services: Not on file  . Active Member of Clubs or  Organizations: Not on file  . Attends Archivist Meetings: Not on file  . Marital Status: Not on file   No past surgical history on file. Past Medical History:  Diagnosis Date  . Hyperlipemia   . Hypertension    BP 134/82   Pulse 80   Temp (!) 97.3 F (36.3 C)   Ht 5' 2.5" (1.588 m)   Wt 158 lb (71.7 kg)   SpO2 97%   BMI 28.44 kg/m   Opioid Risk Score:   Fall Risk Score:  `1  Depression screen PHQ 2/9  No flowsheet data found.    Review of Systems  Gastrointestinal: Positive for constipation.       Objective:   Physical Exam Vitals and nursing note reviewed.  Constitutional:      Appearance: Normal appearance. He is obese.  HENT:     Head: Normocephalic and atraumatic.     Nose: Nose normal.  Eyes:     Extraocular Movements: Extraocular movements intact.     Conjunctiva/sclera: Conjunctivae normal.     Pupils: Pupils are equal, round, and reactive to light.  Cardiovascular:     Rate and Rhythm: Regular rhythm. Tachycardia present.     Heart sounds: Normal heart sounds. No murmur.  Pulmonary:     Effort: Pulmonary effort is normal. No respiratory distress.     Breath sounds: Normal breath sounds.  Abdominal:     General: Abdomen is flat. Bowel sounds are normal.     Palpations: Abdomen is soft.  Musculoskeletal:        General: Normal range of motion.     Cervical back: Normal range of motion.     Comments: Patient has no evidence of knee effusion no tenderness palpation of the knee. He has mildly diminished right shoulder range of motion with abduction.   Skin:    General: Skin is warm and dry.  Neurological:     General: No focal deficit present.     Mental Status: He is alert and oriented to person, place, and time. Mental status is at baseline.     Cranial Nerves: No dysarthria or facial asymmetry.     Motor: No tremor, atrophy or abnormal muscle tone.     Coordination: Impaired rapid alternating movements.     Comments: Ambulates with  an assistive device, walker but he mainly holds it up in the air as he ambulates.  No evidence of toe drag or knee instability.  No gait antalgia There is decreased rapid, alternating supination pronation of the right upper extremity.  Psychiatric:        Mood and Affect: Mood normal.        Behavior: Behavior normal.        Thought Content: Thought content normal.        Judgment: Judgment normal.  Assessment & Plan:  #1.  History of left CVA with right hemiparesis.  He is now receiving home health PT OT.  He should likely progress of the walker to a cane. We discussed that he may require outpatient therapy if he still has right hemiparesis and gait dysfunction that limits his functional independence. Physical medicine rehab reassessment in 6 weeks 2.  History of chest pain, EKG demonstrating left bundle branch block, echocardiogram with normal ejection fraction.  He had good exercise tolerance while on the inpatient rehab unit.  He will follow-up with cardiology on a stress  Over half of the 25 min visit was spent counseling and coordinating care.Spanish interpreter provided instructions to pt

## 2020-01-20 NOTE — Patient Instructions (Addendum)
Voltaren gel 3-4 times a day to the right knee

## 2020-01-28 ENCOUNTER — Telehealth (HOSPITAL_COMMUNITY): Payer: Self-pay

## 2020-01-28 NOTE — Telephone Encounter (Signed)
Instructions left for the patient. Asked to call back with any questions. An interpreter has been assigned for his test. Asked to call back with any questions. S.Jewelianna Pancoast EMTP

## 2020-02-01 ENCOUNTER — Other Ambulatory Visit: Payer: Self-pay

## 2020-02-01 ENCOUNTER — Encounter (INDEPENDENT_AMBULATORY_CARE_PROVIDER_SITE_OTHER): Payer: Medicare Other

## 2020-02-01 ENCOUNTER — Ambulatory Visit (HOSPITAL_COMMUNITY): Payer: Medicare Other | Attending: Cardiovascular Disease

## 2020-02-01 DIAGNOSIS — I447 Left bundle-branch block, unspecified: Secondary | ICD-10-CM

## 2020-02-01 DIAGNOSIS — I4891 Unspecified atrial fibrillation: Secondary | ICD-10-CM

## 2020-02-01 DIAGNOSIS — I639 Cerebral infarction, unspecified: Secondary | ICD-10-CM

## 2020-02-01 LAB — MYOCARDIAL PERFUSION IMAGING
LV dias vol: 80 mL (ref 62–150)
LV sys vol: 35 mL
Peak HR: 106 {beats}/min
Rest HR: 83 {beats}/min
SDS: 1
SRS: 0
SSS: 1
TID: 1

## 2020-02-01 MED ORDER — TECHNETIUM TC 99M TETROFOSMIN IV KIT
9.8000 | PACK | Freq: Once | INTRAVENOUS | Status: AC | PRN
  Administered 2020-02-01: 11:00:00 9.8 via INTRAVENOUS
  Filled 2020-02-01: qty 10

## 2020-02-01 MED ORDER — TECHNETIUM TC 99M TETROFOSMIN IV KIT
30.7000 | PACK | Freq: Once | INTRAVENOUS | Status: AC | PRN
  Administered 2020-02-01: 30.7 via INTRAVENOUS
  Filled 2020-02-01: qty 31

## 2020-02-01 MED ORDER — REGADENOSON 0.4 MG/5ML IV SOLN
0.4000 mg | Freq: Once | INTRAVENOUS | Status: AC
  Administered 2020-02-01: 13:00:00 0.4 mg via INTRAVENOUS

## 2020-02-03 ENCOUNTER — Ambulatory Visit (INDEPENDENT_AMBULATORY_CARE_PROVIDER_SITE_OTHER): Payer: Medicare Other | Admitting: Neurology

## 2020-02-03 ENCOUNTER — Encounter: Payer: Self-pay | Admitting: Neurology

## 2020-02-03 ENCOUNTER — Other Ambulatory Visit: Payer: Self-pay

## 2020-02-03 VITALS — BP 153/107 | HR 103 | Temp 97.8°F | Ht 62.5 in

## 2020-02-03 DIAGNOSIS — G8191 Hemiplegia, unspecified affecting right dominant side: Secondary | ICD-10-CM | POA: Diagnosis not present

## 2020-02-03 DIAGNOSIS — I6350 Cerebral infarction due to unspecified occlusion or stenosis of unspecified cerebral artery: Secondary | ICD-10-CM

## 2020-02-03 DIAGNOSIS — I639 Cerebral infarction, unspecified: Secondary | ICD-10-CM | POA: Diagnosis not present

## 2020-02-03 DIAGNOSIS — I6381 Other cerebral infarction due to occlusion or stenosis of small artery: Secondary | ICD-10-CM

## 2020-02-03 NOTE — Progress Notes (Signed)
[facility] [address] [place]. [place] [address] [phone]       OFFICE CONSULT NOTE  Mr. Marcos Ruelas Date of Birth:  09/22/1941 Medical Record Number:  [id number]   Referring MD: Phillips Odor  Reason for Referral: Stroke  HPI: Mr. Erion is a 79 year old Hispanic male seen today for initial office follow-up visit following hospital admission for stroke in January 2021.  He is accompanied by his granddaughter today.  History is obtained through her who acts as interpreter as well as review of electronic medical records and I personally reviewed imaging films in PACS.  Patient developed sudden onset of right arm weakness and numbness on 12/19/2019.  He also complained of nausea dizziness and trouble walking.  CT scan of the head showed no acute findings but MRI showed left paramedian pontine lacunar infarct.  MRA of the brain and neck both showed no significant large vessel intracranial or extracranial stenosis.  2D echo was unremarkable.  LDL cholesterol was elevated at 169 mg percent and hemoglobin A1c at 8.2.  Patient was started on aspirin Plavix for 3 weeks and on statin diabetes medications.  He was transferred to inpatient rehab where he did well and is currently discharged home.  He is currently getting home physical and occupational therapy.  Is able to walk now with a walker safely and has had no falls.  He states his right-sided weakness is much better the right facial droop is present.  He occasionally also has some dysarthria when he has been talking for a while and gets tired.  Is tolerating aspirin well without bleeding or bruising.  His blood pressure is well controlled at home though today it is elevated in office at 153/110.  His fasting sugars have all been good according to the granddaughter.  Is tolerating Lipitor well without muscle aches and pains.  ROS:   14 system review of systems is positive for slurred speech, facial weakness, leg  weakness, gait difficulty, hand weakness and all other systems negative  PMH:  Past Medical History:  Diagnosis Date  . Hyperlipemia   . Hypertension   . Stroke [facility])     Social History:  Social History   Socioeconomic History  . Marital status: Single    Spouse name: Not on file  . Number of children: Not on file  . Years of education: Not on file  . Highest education level: Not on file  Occupational History  . Not on file  Tobacco Use  . Smoking status: Never Smoker  . Smokeless tobacco: Never Used  Substance and Sexual Activity  . Alcohol use: Not Currently  . Drug use: Never  . Sexual activity: Not on file  Other Topics Concern  . Not on file  Social History Narrative  . Not on file   Social Determinants of Health   Financial Resource Strain:   . Difficulty of Paying Living Expenses: Not on file  Food Insecurity:   . Worried About Charity fundraiser in the Last Year: Not on file  . Ran Out of Food in the Last Year: Not on file  Transportation Needs:   . Lack of Transportation (Medical): Not on file  . Lack of Transportation (Non-Medical): Not on file  Physical Activity:   . Days of Exercise per Week: Not on file  . Minutes of Exercise per Session: Not on file  Stress:   . Feeling of Stress : Not on file  Social Connections:   . Frequency  of Communication with Friends and Family: Not on file  . Frequency of Social Gatherings with Friends and Family: Not on file  . Attends Religious Services: Not on file  . Active Member of Clubs or Organizations: Not on file  . Attends Archivist Meetings: Not on file  . Marital Status: Not on file  Intimate Partner Violence:   . Fear of Current or Ex-Partner: Not on file  . Emotionally Abused: Not on file  . Physically Abused: Not on file  . Sexually Abused: Not on file    Medications:   Current Outpatient Medications on File Prior to Visit  Medication Sig Dispense Refill  . Accu-Chek Softclix Lancets  lancets USE UPTO FOUR TIMES DAILY AS DIRECTED    . acetaminophen (TYLENOL) 325 MG tablet Take 1-2 tablets (325-650 mg total) by mouth every 4 (four) hours as needed for mild pain.    Marland Kitchen aspirin 81 MG EC tablet Take 2 tablets (162 mg total) by mouth daily. 300 tablet 0  . atorvastatin (LIPITOR) 40 MG tablet Take 1 tablet (40 mg total) by mouth daily at 6 PM. 30 tablet 0  . blood glucose meter kit and supplies KIT Dispense based on patient and insurance preference. Use up to four times daily as directed. (FOR ICD-9 250.00, 250.01). 1 each 0  . gabapentin (NEURONTIN) 100 MG capsule Take 1 capsule (100 mg total) by mouth 3 (three) times daily. 90 capsule 0  . glipiZIDE (GLUCOTROL) 5 MG tablet Take 5 mg by mouth every morning.    Marland Kitchen HYDROcodone-acetaminophen (NORCO/VICODIN) 5-325 MG tablet Take 0.5-1 tablets by mouth every 6 (six) hours as needed.    . Menthol-Methyl Salicylate (MUSCLE RUB) 10-15 % CREA Apply 1 application topically 2 (two) times daily as needed for muscle pain. To lower back  0  . metFORMIN (GLUCOPHAGE) 500 MG tablet Take 1 tablet (500 mg total) by mouth 2 (two) times daily with a meal. 60 tablet 0  . Multiple Vitamin (MULTIVITAMIN WITH MINERALS) TABS tablet Take 1 tablet by mouth daily.    Marland Kitchen senna-docusate (SENOKOT-S) 8.6-50 MG tablet Take 2 tablets by mouth 2 (two) times daily. Purchase over the counter 60 tablet 0  . topiramate (TOPAMAX) 25 MG tablet Take 1 tablet (25 mg total) by mouth 2 (two) times daily. 60 tablet 1   No current facility-administered medications on file prior to visit.    Allergies:  No Known Allergies  Physical Exam General: Frail elderly Hispanic male seated, in no evident distress Head: head normocephalic and atraumatic.   Neck: supple with no carotid or supraclavicular bruits Cardiovascular: regular rate and rhythm, no murmurs Musculoskeletal: no deformity Skin:  no rash/petichiae Vascular:  Normal pulses all extremities  Neurologic Exam Mental  Status: Awake and fully alert. Oriented to place and time. Recent and remote memory intact. Attention span, concentration and fund of knowledge appropriate. Mood and affect appropriate.  Cranial Nerves: Fundoscopic exam reveals sharp disc margins. Pupils equal, briskly reactive to light. Extraocular movements full without nystagmus. Visual fields full to confrontation. Hearing intact. Facial sensation intact.  Mild right lower facial weakness.  Tongue, palate moves normally and symmetrically.  Motor: Normal bulk and tone. Normal strength in all tested extremity muscles.  Except mild weakness of right grip and intrinsic hand muscles.  Orbits left over right upper extremity.  Mild weakness of right ankle dorsiflexors and hip flexors. Sensory.:  Diminished touch , pinprick , position and vibratory sensation.  On the right side. Coordination: Rapid  alternating movements normal in all extremities. Finger-to-nose and heel-to-shin performed accurately bilaterally. Gait and Station: Arises from chair without difficulty. Stance is normal. Gait demonstrates slight dragging of the right foot.  Slightly unsteady gait.  Uses a walker.   Reflexes: 1+ and asymmetric and brisker on the right. Toes downgoing.   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  Vascular risk factors of age, hyperlipidemia, diabetes.  Is doing well except mild right hemiparesis     PLAN: I had a long d/w patient and his grand daughter about his recent lacunar stroke, risk for recurrent stroke/TIAs, personally independently reviewed imaging studies and stroke evaluation results and answered questions.Continue aspirin 81 mg daily  for secondary stroke prevention and maintain strict control of hypertension with blood pressure goal below 130/90, diabetes with hemoglobin A1c goal below 6.5% and lipids with LDL cholesterol goal below 70 mg/dL. I  also advised the patient to eat a healthy diet with plenty of whole grains, cereals, fruits and vegetables, exercise regularly and maintain ideal body weight.  Continue ongoing physical and occupational therapy and use of walker at all times for ambulation. Greater than 50% time during this 50-minute consultation visit was spent on counseling and coordination of care about his lacunar stroke and right hemiparesis and answering questions. Followup in the future with my nurse practitioner Janett Billow in 3 months or call earlier if necessary. Antony Contras, MD  [facility] [address] [place], [place] [address]  Phone [phone] Fax [phone] Note: This document was prepared with digital dictation and possible smart phrase technology. Any transcriptional errors that result from this process are unintentional.

## 2020-02-03 NOTE — Patient Instructions (Signed)
I had a long d/w patient and his grand daughter about his recent lacunar stroke, risk for recurrent stroke/TIAs, personally independently reviewed imaging studies and stroke evaluation results and answered questions.Continue aspirin 81 mg daily  for secondary stroke prevention and maintain strict control of hypertension with blood pressure goal below 130/90, diabetes with hemoglobin A1c goal below 6.5% and lipids with LDL cholesterol goal below 70 mg/dL. I also advised the patient to eat a healthy diet with plenty of whole grains, cereals, fruits and vegetables, exercise regularly and maintain ideal body weight.  Continue ongoing physical and occupational therapy and use of walker at all times for ambulation.  Followup in the future with my nurse practitioner Janett Billow in 3 months or call earlier if necessary.  Prevencin del ictus Stroke Prevention Algunas enfermedades o conductas se asocian a un aumento en el riesgo de sufrir un accidente cerebrovascular. Puede ayudar a prevenir un accidente cerebrovascular optando por una alimentacin y un estilo de vida ms saludables y haciendo otros cambios, incluyendo Chief Technology Officer cualquier afeccin mdica que tenga.  Qu cambios nutricionales se pueden realizar?   Consuma alimentos saludables. Puede hacer lo siguiente: ? Elija alimentos ricos en fibra, como frutas y verduras frescas, y cereales integrales. ? Coma 5 o ms porciones de frutas y verduras al da. Trate de que la mitad del plato de cada comida sea de frutas y verduras. ? Elija alimentos con protenas magras, como cortes de carne magros, carne de ave sin piel, pescado, tofu, frijoles y nueces. ? Coma productos lcteos descremados. ? Evite los alimentos con alto contenido de sal (sodio). Esto puede ayudar a Engineer, materials presin arterial. ? Evite los alimentos que contienen grasas saturadas, grasas trans y colesterol. Esto puede ayudar a Engineer, petroleum. ? Evite los alimentos procesados ??y  precocinados.  Siga las pautas especficas de su mdico para perder peso, Chief Technology Officer la presin arterial alta (hipertensin), reducir el colesterol alto y controlar la diabetes. Estos pueden incluir lo siguiente: ? Reducir su ingesta calrica diaria. ? Limitar su consumo diario de sodio a 1500 miligramos (mg). ? Usar solo aceites saludables para cocinar, como el aceite de [place], can[place] o [place]. ? Contar su consumo diario de carbohidratos. Qu cambios en el estilo de vida se pueden realizar?  Mantenga un peso saludable. Hable con el mdico sobre su peso ideal.  Buras por lo menos 62minutos de actividad fsica moderada 5 das por [place]. Actividad fsica moderada incluye caminar rpido, andar en bicicleta y nadar.  No consuma ningn producto que contenga nicotina o tabaco, como cigarrillos y Psychologist, sport and exercise. Si necesita ayuda para dejar de fumar, consulte al [employer]. Tambin puede ser til evitar la exposicin al humo de Export.  Limite el consumo de alcohol a no ms de 78medida por da si es mujer y no est [place], y 61medidas por da si es hombre. Una medida equivale a 12oz (339ml) de cerveza, 5oz (180ml) de vino o 1oz (42ml) de bebidas alcohlicas de alta graduacin.  Detenga el consumo de drogas ilegales.  Evite tomar pldoras anticonceptivas. Hable con su mdico [employer] de tomar pldoras anticonceptivas si: ? Es mayor de 35aos. ? Fuma. ? Tiene migraas. ? Alguna vez ha tenido un cogulo de [place]. Qu otros cambios se pueden realizar?  Controle los niveles de [place]. ? Llevar una dieta saludable es importante para prevenir el colesterol alto. Si el colesterol no puede controlarse nicamente con la dieta, es posible que tambin deba tomar medicamentos. ? [employer] para  controlar el colesterol como se lo haya indicado su mdico.  Controle su diabetes. ? Llevar una dieta saludable y hacer ejercicio regularmente son partes  importantes del control de su nivel de Dispensing optician. Si su nivel de azcar en la sangre no puede controlarse mediante dieta y ejercicio, es posible que deba tomar medicamentos. ? [employer] para controlar la diabetes como se lo haya indicado su mdico.  Controle su hipertensin. ? Para reducir su riesgo de accidente cerebrovascular, trate de mantener su presin arterial por debajo de 130/80. ? Llevar una dieta saludable y hacer ejercicio regularmente son partes importantes del control de su presin arterial. Si su presin arterial no puede controlarse mediante dieta y ejercicio, es posible que deba tomar medicamentos. ? [employer] para controlar la hipertensin como se lo haya indicado su mdico. ? Pregntele a su mdico si debera medirse su presin arterial en el hogar. ? Controle su presin arterial todos los aos, incluso si su presin arterial es normal. La presin arterial aumenta con la edad y algunas afecciones mdicas.  Hgase una evaluacin de los trastornos del sueo (apnea del sueo). Hable con su mdico sobre cmo hacer una evaluacin del sueo si ronca mucho o tiene excesiva somnolencia.  Tome los medicamentos de venta libre y los recetados solamente como se lo haya indicado el mdico. Es posible que se le recomiende tomar aspirina o un diluyente de la sangre (antiplaquetario o Astronomer) para reducir Catering manager de formacin de cogulos de sangre que pueden provocar una accidente cerebrovascular.  Asegrese de tener bajo control cualquier otra afeccin mdica que tenga, como la fibrilacin auricular o la aterosclerosis. Cules son las seales de alerta de un accidente cerebrovascular? Los signos de advertencia de un accidente cerebrovascular se pueden recordar fcilmente como: "BEFAST".  B es por "balance" (equilibrio). Algunos signos son los siguientes: ? Mareos. ? Prdida del equilibrio o de la coordinacin. ? Dificultad repentina para caminar.   E es por "eyes" (ojos). Algunos signos son los siguientes: ? Un cambio repentino en la visin. ? Dificultad para ver.  F es por "face" (rostro). Algunos signos son los siguientes: ? Debilidad o adormecimiento del rostro. ? Su rostro o prpado se caen hacia un lado.  A es por "arms" (brazo). Algunos signos son los siguientes: ? Debilidad o adormecimiento sbito del brazo, generalmente en un lado del cuerpo.  S es por "speech" (habla). Algunos signos son los siguientes: ? Dificultad para hablar (afasia). ? Dificultad para comprender.  T es por "time" Statistician). ? Estos sntomas pueden representar un problema grave que constituye Engineer, maintenance (IT). No espere hasta que los sntomas desaparezcan. Solicite atencin mdica de inmediato. Llame a su servicio de Therapist, sports (911 en los [place]). No conduzca por sus propios medios [employer] hospital.  Otros signos de un accidente cerebrovascular pueden incluir: ? Dolor de cabeza sbito e intenso que no tiene causa aparente. ? Nuseas o vmitos. ? Convulsiones. Dnde encontrar ms informacin Para ms informacin, visite el siguiente enlace:  American Stroke Association (Asociacin Americana de Accidente Cerebrovascular): www.strokeassociation.org  National Stroke Association Arts development officer de Accidente Cerebrovascular): www.stroke.org Resumen  Puede prevenir un accidente cerebrovascular optando por una alimentacin saludable, hacer ejercicio, no fumar, limitar el consumo de alcohol, y manteniendo bajo control cualquier afeccin mdica que tenga.  No consuma ningn producto que contenga nicotina o tabaco, como cigarrillos y Psychologist, sport and exercise. Si necesita ayuda para dejar de fumar, consulte al mdico. Tambin puede ser til evitar la exposicin  al humo de segunda mano.  Recuerde las seales de alerta de un accidente cerebrovascular "BEFAST". Obtenga ayuda de inmediato si usted o un ser querido presenta alguna de estas  seales de Hydrographic surveyor. Esta informacin no tiene Marine scientist el consejo del mdico. Asegrese de hacerle al mdico cualquier pregunta que tenga. Document Revised: 04/08/2017 Document Reviewed: 04/08/2017 Elsevier Patient Education  [place].

## 2020-02-15 ENCOUNTER — Other Ambulatory Visit: Payer: Self-pay | Admitting: Physical Medicine and Rehabilitation

## 2020-03-02 ENCOUNTER — Encounter: Payer: Medicare Other | Attending: Physical Medicine & Rehabilitation | Admitting: Physical Medicine & Rehabilitation

## 2020-03-02 DIAGNOSIS — R269 Unspecified abnormalities of gait and mobility: Secondary | ICD-10-CM | POA: Insufficient documentation

## 2020-03-02 DIAGNOSIS — I6350 Cerebral infarction due to unspecified occlusion or stenosis of unspecified cerebral artery: Secondary | ICD-10-CM | POA: Insufficient documentation

## 2020-03-02 DIAGNOSIS — I69398 Other sequelae of cerebral infarction: Secondary | ICD-10-CM | POA: Insufficient documentation

## 2020-03-14 ENCOUNTER — Other Ambulatory Visit: Payer: Self-pay | Admitting: Physician Assistant

## 2020-03-14 DIAGNOSIS — I4891 Unspecified atrial fibrillation: Secondary | ICD-10-CM

## 2020-03-14 DIAGNOSIS — I447 Left bundle-branch block, unspecified: Secondary | ICD-10-CM

## 2020-03-14 DIAGNOSIS — I639 Cerebral infarction, unspecified: Secondary | ICD-10-CM

## 2020-03-26 NOTE — Progress Notes (Signed)
Cardiology Office Note   Date:  03/30/2020   ID:  Eugene Cooper, DOB 1941/06/02, MRN [id number]  PCP:  Emelia Loron, NP    No chief complaint on file.  LBBB  Wt Readings from Last 3 Encounters:  03/30/20 160 lb 6.4 oz (72.8 kg)  02/01/20 158 lb (71.7 kg)  01/20/20 158 lb (71.7 kg)       History of Present Illness: Eugene Cooper is a 79 y.o. male  Who saw Eugene Cooper in 2/21.  He "is a 79 y.o. male with history of hypertension, hyperlipidemia, DM type II, CKD stage III who was admitted with CVA and found to have a new LBBB on EKG.  Troponins were negative and he had no chest pain.  Patient is Spanish-speaking and difficult historian.  He is a non-smoker and has no family history of CAD.  2D echo showed normal LVEF without significant wall motion abnormalities.  Plan was to follow-up as an outpatient and likely schedule Lexiscan once he is recovered from his stroke.  Patient comes in accompanied by his grand daughter. Walking with walker. Says his chest feels tired when he walks ever since his stroke. Also complains of knee and back pain. Thinks it's from using the walker. Patient saw PCP with [facility] and they scheduled stress test in [facility] for next week."  Noted in 2/21: Lives in [place] and wanted to get testing there: "No family history of CAD.  He saw PCP with [facility] last week who scheduled L stress test in [facility].  Family and patient would like to stay local and only lives 5 minutes away.  We will schedule Lexiscan Myoview here and let them cancel that.   Acute left pontine CVA aspirin.  Echo without evidence of thrombi or PFO.  Carotid imaging no stenosis.  Will place 30-day monitor.  No evidence of A. fib in the hospital.  Essential hypertension well-controlled off meds.  He was previously on lisinopril HCTZ 20/25 mg.  Hyperlipidemia LDL 169 triglycerides 267 now on Lipitor 40 mg daily.  Will need fasting lipid panel in 3 months."   2/21 stress test showed:  "Nuclear stress EF: 56%.  The left ventricular ejection fraction is normal (55-65%).  There was no ST segment deviation noted during stress.  This is a low risk study. The study is normal."  Walking limited by back pain and ankle pain.  Denies : Chest pain. Dizziness. Leg edema. Nitroglycerin use. Orthopnea. Palpitations. Paroxysmal nocturnal dyspnea. Shortness of breath. Syncope.   He does a lot of gardening for activity.  He uses resistance bands as well.   History obtained with help of interpreter.    Past Medical History:  Diagnosis Date  . Hyperlipemia   . Hypertension   . Stroke [facility])     No past surgical history on file.   Current Outpatient Medications  Medication Sig Dispense Refill  . Accu-Chek Softclix Lancets lancets USE UPTO FOUR TIMES DAILY AS DIRECTED    . acetaminophen (TYLENOL) 325 MG tablet Take 1-2 tablets (325-650 mg total) by mouth every 4 (four) hours as needed for mild pain.    Marland Kitchen aspirin 81 MG EC tablet Take 2 tablets (162 mg total) by mouth daily. 300 tablet 0  . atorvastatin (LIPITOR) 40 MG tablet Take 1 tablet (40 mg total) by mouth daily at 6 PM. 30 tablet 0  . blood glucose meter kit and supplies KIT Dispense based on patient and insurance preference. Use up to four times daily  as directed. (FOR ICD-9 250.00, 250.01). 1 each 0  . gabapentin (NEURONTIN) 100 MG capsule Take 1 capsule (100 mg total) by mouth 3 (three) times daily. 90 capsule 0  . glipiZIDE (GLUCOTROL) 5 MG tablet Take 5 mg by mouth every morning.    Marland Kitchen HYDROcodone-acetaminophen (NORCO/VICODIN) 5-325 MG tablet Take 0.5-1 tablets by mouth every 6 (six) hours as needed.    Marland Kitchen lisinopril (ZESTRIL) 5 MG tablet Take 5 mg by mouth daily.    . Menthol-Methyl Salicylate (MUSCLE RUB) 10-15 % CREA Apply 1 application topically 2 (two) times daily as needed for muscle pain. To lower back  0  . metFORMIN (GLUCOPHAGE) 1000 MG tablet Take 1,000 mg by mouth 2 (two) times daily.    . Multiple Vitamin  (MULTIVITAMIN WITH MINERALS) TABS tablet Take 1 tablet by mouth daily.    Marland Kitchen senna-docusate (SENOKOT-S) 8.6-50 MG tablet Take 2 tablets by mouth 2 (two) times daily. Purchase over the counter 60 tablet 0  . topiramate (TOPAMAX) 25 MG tablet Take 1 tablet (25 mg total) by mouth 2 (two) times daily. 60 tablet 1   No current facility-administered medications for this visit.    Allergies:   Patient has no known allergies.    Social History:  The patient  reports that he has never smoked. He has never used smokeless tobacco. He reports previous alcohol use. He reports that he does not use drugs.   Family History:  The patient's *family history is not on file.  No family history of CAD   ROS:  Please see the history of present illness.   Otherwise, review of systems are positive for back pain.   All other systems are reviewed and negative.    PHYSICAL EXAM: VS:  BP 128/70   Pulse 65   Ht 5' 2.5" (1.588 m)   Wt 160 lb 6.4 oz (72.8 kg)   SpO2 97%   BMI 28.87 kg/m  , BMI Body mass index is 28.87 kg/m. GEN: Well nourished, well developed, in no acute distress  HEENT: normal  Neck: no JVD, carotid bruits, or masses Cardiac: RRR; no murmurs, rubs, or gallops,no edema  Respiratory:  clear to auscultation bilaterally, normal work of breathing GI: soft, nontender, nondistended, + BS MS: no deformity or atrophy  Skin: warm and dry, no rash Neuro:  Strength and sensation are intact Psych: euthymic mood, full affect    Recent Labs: 12/23/2019: ALT 55 01/03/2020: Hemoglobin 14.6; Platelets 404 01/06/2020: BUN 26; Creatinine, Ser 1.08; Potassium 4.2; Sodium 139   Lipid Panel    Component Value Date/Time   CHOL 258 (H) 12/20/2019 0639   TRIG 267 (H) 12/20/2019 0639   HDL 36 (L) 12/20/2019 0639   CHOLHDL 7.2 12/20/2019 0639   VLDL 53 (H) 12/20/2019 0639   LDLCALC 169 (H) 12/20/2019 1610     Other studies Reviewed: Additional studies/ records that were reviewed today with results  demonstrating: Labs from PMD reviewed; LDL elevated in 1/21..   ASSESSMENT AND PLAN:  1.   LBBB : DOing well from cardiac standpoint.  No ischemia by stress test.  2.   CKD: Check labs today.  Avoid nephrotoxins.  He should minimize NSAIDs. 3.   DM2: Check A1C today.  Gets some exercise.  WOuld like to see him increase HR fro 30 minutes a day, 5 days a week.  Managed with PMD.  4.   HTN: The current medical regimen is effective;  continue present plan and medications. 6. Prior CVA:  sees neuro.  BP target < 130/90.     Current medicines are reviewed at length with the patient today.  The patient concerns regarding his medicines were addressed.  The following changes have been made:  No change  Labs/ tests ordered today include:  No orders of the defined types were placed in this encounter.   Recommend 150 minutes/week of aerobic exercise Low fat, low carb, high fiber diet recommended  Disposition:   FU in 1 year   Signed, Larae Grooms, MD  03/30/2020 10:28 AM    [facility] [address], [place], [place]  [address] Phone: [phone]; Fax: [phone]

## 2020-03-30 ENCOUNTER — Ambulatory Visit (INDEPENDENT_AMBULATORY_CARE_PROVIDER_SITE_OTHER): Payer: Medicare Other | Admitting: Interventional Cardiology

## 2020-03-30 ENCOUNTER — Other Ambulatory Visit: Payer: Self-pay

## 2020-03-30 ENCOUNTER — Encounter: Payer: Self-pay | Admitting: Interventional Cardiology

## 2020-03-30 VITALS — BP 128/70 | HR 65 | Ht 62.5 in | Wt 160.4 lb

## 2020-03-30 DIAGNOSIS — I447 Left bundle-branch block, unspecified: Secondary | ICD-10-CM | POA: Diagnosis not present

## 2020-03-30 DIAGNOSIS — N183 Chronic kidney disease, stage 3 unspecified: Secondary | ICD-10-CM

## 2020-03-30 DIAGNOSIS — I10 Essential (primary) hypertension: Secondary | ICD-10-CM | POA: Diagnosis not present

## 2020-03-30 DIAGNOSIS — I639 Cerebral infarction, unspecified: Secondary | ICD-10-CM

## 2020-03-30 DIAGNOSIS — E782 Mixed hyperlipidemia: Secondary | ICD-10-CM

## 2020-03-30 DIAGNOSIS — E1165 Type 2 diabetes mellitus with hyperglycemia: Secondary | ICD-10-CM

## 2020-03-30 MED ORDER — ASPIRIN 81 MG PO TBEC: 81.0000 mg | DELAYED_RELEASE_TABLET | Freq: Every day | ORAL | 3 refills | Status: DC

## 2020-03-30 MED ORDER — LISINOPRIL 5 MG PO TABS: 5.0000 mg | ORAL_TABLET | Freq: Every day | ORAL | 3 refills | Status: DC

## 2020-03-30 NOTE — Patient Instructions (Signed)
Medication Instructions:  Your physician has recommended you make the following change in your medication:   DECREASE: aspirin to 81 mg tablet once a day  *If you need a refill on your cardiac medications before your next appointment, please call your pharmacy*   Lab Work: TODAY: CMET, LIPIDS, A1C  If you have labs (blood work) drawn today and your tests are completely normal, you will receive your results only by: Marland Kitchen MyChart Message (if you have MyChart) OR . A paper copy in the mail If you have any lab test that is abnormal or we need to change your treatment, we will call you to review the results.   Testing/Procedures: None ordered   Follow-Up: At [facility], you and your health needs are our priority.  As part of our continuing mission to provide you with exceptional heart care, we have created designated Provider Care Teams.  These Care Teams include your primary Cardiologist (physician) and Advanced Practice Providers (APPs -  Physician Assistants and Nurse Practitioners) who all work together to provide you with the care you need, when you need it.  We recommend signing up for the patient portal called "MyChart".  Sign up information is provided on this After Visit Summary.  MyChart is used to connect with patients for Virtual Visits (Telemedicine).  Patients are able to view lab/test results, encounter notes, upcoming appointments, etc.  Non-urgent messages can be sent to your provider as well.   To learn more about what you can do with MyChart, go to [email or web address].    Your next appointment:   12 month(s)  The format for your next appointment:   In Person  Provider:   You may see Larae Grooms, MD or one of the following Advanced Practice Providers on your designated Care Team:    Melina Copa, PA-C  Ermalinda Barrios, PA-C    Other Instructions Systolic Blood Pressure (top number) goal is to be less than 130

## 2020-03-31 LAB — COMPREHENSIVE METABOLIC PANEL
ALT: 32 IU/L (ref 0–44)
AST: 28 IU/L (ref 0–40)
Albumin/Globulin Ratio: 2 (ref 1.2–2.2)
Albumin: 4.7 g/dL (ref 3.7–4.7)
Alkaline Phosphatase: 73 IU/L (ref 39–117)
BUN/Creatinine Ratio: 11 (ref 10–24)
BUN: 10 mg/dL (ref 8–27)
Bilirubin Total: 0.8 mg/dL (ref 0.0–1.2)
CO2: 25 mmol/L (ref 20–29)
Calcium: 9.8 mg/dL (ref 8.6–10.2)
Chloride: 101 mmol/L (ref 96–106)
Creatinine, Ser: 0.93 mg/dL (ref 0.76–1.27)
GFR calc Af Amer: 90 mL/min/{1.73_m2} (ref >59)
GFR calc non Af Amer: 78 mL/min/{1.73_m2} (ref >59)
Globulin, Total: 2.4 g/dL (ref 1.5–4.5)
Glucose: 105 mg/dL — ABNORMAL HIGH (ref 65–99)
Potassium: 4.5 mmol/L (ref 3.5–5.2)
Sodium: 140 mmol/L (ref 134–144)
Total Protein: 7.1 g/dL (ref 6.0–8.5)

## 2020-03-31 LAB — HEMOGLOBIN A1C
Est. average glucose Bld gHb Est-mCnc: 140 mg/dL
Hgb A1c MFr Bld: 6.5 % — ABNORMAL HIGH (ref 4.8–5.6)

## 2020-03-31 LAB — LIPID PANEL
Chol/HDL Ratio: 2.5 ratio (ref 0.0–5.0)
Cholesterol, Total: 110 mg/dL (ref 100–199)
HDL: 44 mg/dL (ref >39)
LDL Chol Calc (NIH): 45 mg/dL (ref 0–99)
Triglycerides: 115 mg/dL (ref 0–149)
VLDL Cholesterol Cal: 21 mg/dL (ref 5–40)

## 2020-04-06 ENCOUNTER — Telehealth: Payer: Self-pay

## 2020-04-06 NOTE — Telephone Encounter (Signed)
[place] would like to speak with a Nurse [phone] on Pt   Please call Crystal [phone]   Thanks renee

## 2020-04-06 NOTE — Telephone Encounter (Signed)
Crystal requesting that Dr. Harl Bowie review and sign medication profile on pt. Profile to be faxed to office.

## 2020-04-17 ENCOUNTER — Other Ambulatory Visit: Payer: Medicare Other

## 2020-04-18 ENCOUNTER — Telehealth: Payer: Self-pay | Admitting: Interventional Cardiology

## 2020-04-18 NOTE — Telephone Encounter (Signed)
Crystal from [facility] is calling stating they faxed over a form that needs to be filled out, signed, and faxed back over by Dr. Irish Lack yesterday 04/17/20. Please give Crystal a confirmation call that the fax was received and has been sent back.

## 2020-04-18 NOTE — Telephone Encounter (Signed)
Returned call to [place] her aware that we have not received any form yet. She states that she will fax it again.

## 2020-04-19 NOTE — Telephone Encounter (Signed)
Crystal calling back stating she faxed over the form yesterday and would like to know if it was received.

## 2020-04-19 NOTE — Telephone Encounter (Signed)
Spoke with Crystal at [employer] and made her aware there is a form in Dr. Hassell Done mailbox.  Advised we will get that sent back over once he has signed it.  Crystal appreciative for call.

## 2020-04-24 NOTE — Telephone Encounter (Signed)
Called and spoke to [place] her aware that we did receive the fax that she sent, however, the orders were for glipizide, hydrocodone-acetaminophen, and lisinopril. Made her aware that we do not prescribe pain meds or diabetes meds and that these orders will need to be signed by PCP. She verbalized understanding and thanked me for the call.

## 2020-05-04 ENCOUNTER — Ambulatory Visit: Payer: Medicare Other | Admitting: Adult Health

## 2020-05-16 ENCOUNTER — Other Ambulatory Visit: Payer: Medicare Other

## 2020-05-16 ENCOUNTER — Other Ambulatory Visit: Payer: Self-pay

## 2020-05-16 DIAGNOSIS — N183 Chronic kidney disease, stage 3 unspecified: Secondary | ICD-10-CM

## 2020-05-16 DIAGNOSIS — I639 Cerebral infarction, unspecified: Secondary | ICD-10-CM

## 2020-05-16 DIAGNOSIS — I447 Left bundle-branch block, unspecified: Secondary | ICD-10-CM

## 2020-05-16 DIAGNOSIS — I10 Essential (primary) hypertension: Secondary | ICD-10-CM

## 2020-05-16 DIAGNOSIS — E782 Mixed hyperlipidemia: Secondary | ICD-10-CM

## 2020-05-16 LAB — HEPATIC FUNCTION PANEL
ALT: 75 IU/L — ABNORMAL HIGH (ref 0–44)
AST: 29 IU/L (ref 0–40)
Albumin: 4.3 g/dL (ref 3.7–4.7)
Alkaline Phosphatase: 105 IU/L (ref 48–121)
Bilirubin Total: 0.7 mg/dL (ref 0.0–1.2)
Bilirubin, Direct: 0.19 mg/dL (ref 0.00–0.40)
Total Protein: 6.7 g/dL (ref 6.0–8.5)

## 2020-05-16 LAB — LIPID PANEL
Chol/HDL Ratio: 3.5 ratio (ref 0.0–5.0)
Cholesterol, Total: 215 mg/dL — ABNORMAL HIGH (ref 100–199)
HDL: 61 mg/dL (ref >39)
LDL Chol Calc (NIH): 105 mg/dL — ABNORMAL HIGH (ref 0–99)
Triglycerides: 289 mg/dL — ABNORMAL HIGH (ref 0–149)
VLDL Cholesterol Cal: 49 mg/dL — ABNORMAL HIGH (ref 5–40)

## 2020-05-29 ENCOUNTER — Ambulatory Visit: Payer: Medicare Other | Admitting: Adult Health

## 2020-05-29 ENCOUNTER — Encounter: Payer: Self-pay | Admitting: Adult Health

## 2020-05-29 NOTE — Progress Notes (Deleted)
[facility] [address] [place]. [place] [address] [phone]       OFFICE FOLLOW UP NOTE  [name] Date of Birth:  [birth year] Medical Record Number:  [id number]   Referring MD: Phillips Odor  Reason for Referral: Stroke  Chief complaint: No chief complaint on file.    HPI:   Today, 05/29/2020, Eugene Cooper is being seen for stroke follow-up accompanied by *** and interpreter.  He has been stable since prior visit from a stroke standpoint with residual right-sided weakness and dysarthria.  Continues on aspirin and atorvastatin for secondary stroke prevention.  Blood pressure today ***.  Glucose levels ***.  No concerns at this time.      History copied for reference purposes only Initial visit 02/03/2020 Dr. Theresia Lo: Eugene Cooper is a 79 year old Hispanic male seen today for initial office follow-up visit following hospital admission for stroke in January 2021.  He is accompanied by his granddaughter today.  History is obtained through her who acts as interpreter as well as review of electronic medical records and I personally reviewed imaging films in PACS.  Patient developed sudden onset of right arm weakness and numbness on 12/19/2019.  He also complained of nausea dizziness and trouble walking.  CT scan of the head showed no acute findings but MRI showed left paramedian pontine lacunar infarct.  MRA of the brain and neck both showed no significant large vessel intracranial or extracranial stenosis.  2D echo was unremarkable.  LDL cholesterol was elevated at 169 mg percent and hemoglobin A1c at 8.2.  Patient was started on aspirin Plavix for 3 weeks and on statin diabetes medications.  He was transferred to inpatient rehab where he did well and is currently discharged home.  He is currently getting home physical and occupational therapy.  Is able to walk now with a walker safely and has had no falls.  He states his right-sided weakness is much better the  right facial droop is present.  He occasionally also has some dysarthria when he has been talking for a while and gets tired.  Is tolerating aspirin well without bleeding or bruising.  His blood pressure is well controlled at home though today it is elevated in office at 153/110.  His fasting sugars have all been good according to the granddaughter.  Is tolerating Lipitor well without muscle aches and pains.  ROS:   14 system review of systems is positive for slurred speech, facial weakness, leg weakness, gait difficulty, hand weakness and all other systems negative  PMH:  Past Medical History:  Diagnosis Date  . Hyperlipemia   . Hypertension   . Stroke [facility])     Social History:  Social History   Socioeconomic History  . Marital status: Single    Spouse name: Not on file  . Number of children: Not on file  . Years of education: Not on file  . Highest education level: Not on file  Occupational History  . Not on file  Tobacco Use  . Smoking status: Never Smoker  . Smokeless tobacco: Never Used  Substance and Sexual Activity  . Alcohol use: Not Currently  . Drug use: Never  . Sexual activity: Not on file  Other Topics Concern  . Not on file  Social History Narrative  . Not on file   Social Determinants of Health   Financial Resource Strain:   . Difficulty of Paying Living Expenses:   Food Insecurity:   . Worried About Charity fundraiser  in the Last Year:   . [place] in the Last Year:   Transportation Needs:   . Film/video editor (Medical):   Marland Kitchen Lack of Transportation (Non-Medical):   Physical Activity:   . Days of Exercise per Week:   . Minutes of Exercise per Session:   Stress:   . Feeling of Stress :   Social Connections:   . Frequency of Communication with Friends and Family:   . Frequency of Social Gatherings with Friends and Family:   . Attends Religious Services:   . Active Member of Clubs or Organizations:   . Attends Archivist  Meetings:   Marland Kitchen Marital Status:   Intimate Partner Violence:   . Fear of Current or Ex-Partner:   . Emotionally Abused:   Marland Kitchen Physically Abused:   . Sexually Abused:     Medications:   Current Outpatient Medications on File Prior to Visit  Medication Sig Dispense Refill  . Accu-Chek Softclix Lancets lancets USE UPTO FOUR TIMES DAILY AS DIRECTED    . acetaminophen (TYLENOL) 325 MG tablet Take 1-2 tablets (325-650 mg total) by mouth every 4 (four) hours as needed for mild pain.    Marland Kitchen aspirin 81 MG EC tablet Take 1 tablet (81 mg total) by mouth daily. 90 tablet 3  . atorvastatin (LIPITOR) 40 MG tablet Take 1 tablet (40 mg total) by mouth daily at 6 PM. 30 tablet 0  . blood glucose meter kit and supplies KIT Dispense based on patient and insurance preference. Use up to four times daily as directed. (FOR ICD-9 250.00, 250.01). 1 each 0  . gabapentin (NEURONTIN) 100 MG capsule Take 1 capsule (100 mg total) by mouth 3 (three) times daily. 90 capsule 0  . glipiZIDE (GLUCOTROL) 5 MG tablet Take 5 mg by mouth every morning.    Marland Kitchen HYDROcodone-acetaminophen (NORCO/VICODIN) 5-325 MG tablet Take 0.5-1 tablets by mouth every 6 (six) hours as needed.    Marland Kitchen lisinopril (ZESTRIL) 5 MG tablet Take 1 tablet (5 mg total) by mouth daily. 90 tablet 3  . Menthol-Methyl Salicylate (MUSCLE RUB) 10-15 % CREA Apply 1 application topically 2 (two) times daily as needed for muscle pain. To lower back  0  . metFORMIN (GLUCOPHAGE) 1000 MG tablet Take 1,000 mg by mouth 2 (two) times daily.    . Multiple Vitamin (MULTIVITAMIN WITH MINERALS) TABS tablet Take 1 tablet by mouth daily.    Marland Kitchen senna-docusate (SENOKOT-S) 8.6-50 MG tablet Take 2 tablets by mouth 2 (two) times daily. Purchase over the counter 60 tablet 0  . topiramate (TOPAMAX) 25 MG tablet Take 1 tablet (25 mg total) by mouth 2 (two) times daily. 60 tablet 1   No current facility-administered medications on file prior to visit.    Allergies:  No Known  Allergies    There were no vitals filed for this visit. There is no height or weight on file to calculate BMI.   Physical Exam General: Frail elderly Hispanic male seated, in no evident distress Head: head normocephalic and atraumatic.   Neck: supple with no carotid or supraclavicular bruits Cardiovascular: regular rate and rhythm, no murmurs Musculoskeletal: no deformity Skin:  no rash/petichiae Vascular:  Normal pulses all extremities  Neurologic Exam Mental Status: Awake and fully alert. Oriented to place and time. Recent and remote memory intact. Attention span, concentration and fund of knowledge appropriate. Mood and affect appropriate.  Cranial Nerves: Pupils equal, briskly reactive to light. Extraocular movements full without nystagmus. Visual fields  full to confrontation. Hearing intact. Facial sensation intact.  Mild right lower facial weakness.  Tongue, palate moves normally and symmetrically.  Motor: Normal bulk and tone.  Full strength left upper and lower extremity. Mild weakness of right grip and intrinsic hand muscles.  Orbits left over right upper extremity.  Mild weakness of right ankle dorsiflexors and hip flexors. Sensory.:  Diminished touch , pinprick , position and vibratory sensation.  On the right side. Coordination: Rapid alternating movements normal in all extremities. Finger-to-nose and heel-to-shin performed accurately bilaterally. Gait and Station: Arises from chair without difficulty. Stance is normal. Gait demonstrates slight dragging of the right foot.  Slightly unsteady gait.  Uses a walker.   Reflexes: 1+ and asymmetric and brisker on the right. Toes downgoing.       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  Vascular risk factors of age, hyperlipidemia, diabetes.  Is doing well except mild right hemiparesis     PLAN: Continue aspirin 81 mg daily  for secondary stroke  prevention maintain strict control of hypertension with blood pressure goal below 130/90, diabetes with hemoglobin A1c goal below 6.5% and lipids with LDL cholesterol goal below 70 mg/dL. I also advised the patient to eat a healthy diet with plenty of whole grains, cereals, fruits and vegetables, exercise regularly and maintain ideal body weight.   Continue ongoing physical and occupational therapy and use of walker at all times for ambulation.   Greater than 50% time during this 50-minute consultation visit was spent on counseling and coordination of care about his lacunar stroke and right hemiparesis and answering questions.   Frann Rider, AGNP-BC  [facility] [address] [place], [place] [address]  Phone [phone] Fax [phone] Note: This document was prepared with digital dictation and possible smart phrase technology. Any transcriptional errors that result from this process are unintentional.

## 2020-10-23 ENCOUNTER — Other Ambulatory Visit: Payer: Self-pay | Admitting: Physical Medicine and Rehabilitation

## 2021-06-04 ENCOUNTER — Ambulatory Visit: Payer: Self-pay

## 2021-06-04 ENCOUNTER — Other Ambulatory Visit: Payer: Self-pay

## 2021-06-04 ENCOUNTER — Ambulatory Visit (INDEPENDENT_AMBULATORY_CARE_PROVIDER_SITE_OTHER): Payer: Medicare Other | Admitting: Internal Medicine

## 2021-06-04 ENCOUNTER — Encounter: Payer: Self-pay | Admitting: Internal Medicine

## 2021-06-04 VITALS — BP 151/92 | HR 77 | Ht 62.0 in | Wt 163.0 lb

## 2021-06-04 DIAGNOSIS — G8929 Other chronic pain: Secondary | ICD-10-CM | POA: Diagnosis not present

## 2021-06-04 DIAGNOSIS — M25512 Pain in left shoulder: Secondary | ICD-10-CM | POA: Diagnosis not present

## 2021-06-04 DIAGNOSIS — M1A09X0 Idiopathic chronic gout, multiple sites, without tophus (tophi): Secondary | ICD-10-CM | POA: Diagnosis not present

## 2021-06-04 DIAGNOSIS — M109 Gout, unspecified: Secondary | ICD-10-CM | POA: Insufficient documentation

## 2021-06-04 MED ORDER — ALLOPURINOL 300 MG PO TABS: 300.0000 mg | ORAL_TABLET | Freq: Every day | ORAL | 0 refills | Status: DC

## 2021-06-04 NOTE — Progress Notes (Signed)
Office Visit Note  [name]             Date of Birth: Jul 29, 1941           MRN: [id number]             PCP: Emelia Loron, NP Referring: Emelia Loron, NP Visit Date: 06/04/2021 Occupation: Retired Interior and spatial designer  Subjective:  New Patient (Initial Visit) (Patient complains of left shoulder pain and bilateral knee pain. Patient was taking Allopurinol, but no longer is and noticed improvement with symptoms while taking it. )   History of Present Illness: Eugene Cooper is a 80 y.o. 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 He noticed episodes of acute pain and swelling ongoing for up to about 3 years most commonly provoked when eating red meats. 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 He was taking allopurinol previously but is no longer, without any specific intolerance or indication for discontinuing this. His feet are currently well without joint pain or swelling today. Besides these episodes of gouty arthritis he also notices worsening of left shoulder pain chronic but worsening more for several weeks. This has not varying in any particular correlation with his foot inflammation. He denies any particular injury associated with increase in this problem. He has had previous intraarticular steroid injection for his knees but never in the upper extremities.  04/2021 Uric acid 10.9 BUN 13 sCr 1.3 eGFR 53  Activities of Daily Living:  Patient reports morning stiffness for 1-5 minutes.   Patient Denies nocturnal pain.  Difficulty dressing/grooming: Denies Difficulty climbing stairs: Denies Difficulty getting out of chair: Denies Difficulty using hands for taps, buttons, cutlery, and/or writing: Denies  Review of Systems  Constitutional:  Negative for fatigue.  HENT:  Negative for mouth sores, mouth dryness and nose  dryness.   Eyes:  Positive for itching and visual disturbance. Negative for pain and dryness.  Respiratory:  Negative for cough, hemoptysis, shortness of breath and difficulty breathing.   Cardiovascular:  Negative for chest pain, palpitations and swelling in legs/feet.  Gastrointestinal:  Positive for constipation. Negative for abdominal pain, blood in stool and diarrhea.  Endocrine: Positive for increased urination.  Genitourinary:  Negative for painful urination.  Musculoskeletal:  Positive for joint pain, joint pain and morning stiffness. Negative for joint swelling, myalgias, muscle weakness, muscle tenderness and myalgias.  Skin:  Negative for color change, rash and redness.  Allergic/Immunologic: Negative for susceptible to infections.  Neurological:  Positive for light-headedness and headaches. Negative for dizziness, numbness, memory loss and weakness.  Hematological:  Negative for swollen glands.  Psychiatric/Behavioral:  Negative for confusion and sleep disturbance.    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 No pertinent surgical history. Social History   Social History Narrative   Not on file   Immunization History  Administered Date(s) Administered   Fluad Quad(high Dose 65+) 12/21/2019   Pneumococcal Polysaccharide-23 12/21/2019     Objective: Vital Signs: BP (!) 151/92 (BP Location: Left Arm, Patient Position: Sitting, Cuff Size: Normal)   Pulse 77   Ht 5' 2"  (1.575 m)   Wt 163 lb (73.9 kg)   BMI 29.81 kg/m    Physical Exam Cardiovascular:     Rate and Rhythm: Normal rate and regular rhythm.  Pulmonary:     Effort: Pulmonary effort is normal.     Breath sounds: Normal breath sounds.  Skin:    General: Skin is warm and dry.     Findings: No rash.  Neurological:     General: No focal deficit present.     Mental Status: He is alert.  Psychiatric:        Mood and Affect: Mood normal.     Musculoskeletal Exam:  Shoulders right side slightly decreased abduction otherwise full ROM intact, left side decreased abduction and extension ROM, positive neers and hawkins, tenderness to pressure over AC joint Elbows full ROM no tenderness or swelling Wrists full ROM no tenderness or swelling Fingers full ROM no tenderness or swelling Knees slightly decreased extension ROM, no effusions Ankles full ROM no tenderness or swelling  Investigation: No additional findings.  Imaging: XR Shoulder Left  Result Date: 06/05/2021 Xray left shoulder 4 views Glenohumeral joint space appears normal. Normal internal and external rotation. C joint degenerative arthritis without erosive changes. Normal bone mineralization. No soft tissue calcifications or joint effusion seen. 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  Procedures:  Large Joint Inj: L subacromial bursa on 06/04/2021 11:20 AM Details: 25 G 1.5 in needle, lateral approach Medications: 3 mL lidocaine 1 %; 40 mg triamcinolone acetonide 40 MG/ML Outcome: tolerated well, no immediate complications Procedure, treatment alternatives, risks and benefits explained, specific risks discussed. Consent was given by the patient. Immediately prior to procedure a time out was called to verify the correct patient, procedure, equipment, support staff and site/side marked as required. Patient was prepped and draped in the usual sterile fashion.    Allergies: Patient has no known allergies.   Assessment / Plan:  Visit Diagnoses: Chronic gout of multiple sites, unspecified cause - Plan: allopurinol (ZYLOPRIM) 300 MG tablet  Gouty arthritis with classic podagra and also foot and knee pain. He reports last flare was a few months ago but considering multiple attacks within last 6 months do recommend urate lowering therapy. His latest labs show fairly good renal function and he would be restarted a previously tolerated medication so resuming at 300 mg daily dose, will need to f/u in about 4 weeks for monitoring and response to treatment.  Chronic left shoulder pain - Plan: XR Shoulder Left  Left shoulder pain appears consistent with probable rotator cuff symptoms. Xray reviewed in clinic also shows some AC joint arthritis but localization on exam strongly appears more lateral. Steroid injection to subacromial bursa in clinic for symptoms.  Orders: Orders Placed  This Encounter  Procedures   Large Joint Inj   XR Shoulder Left    Meds ordered this encounter  Medications   allopurinol (ZYLOPRIM) 300 MG tablet    Sig: Take 1 tablet (300 mg total) by mouth daily.    Dispense:  30 tablet    Refill:  0      Follow-Up Instructions: Return in about 4 weeks (around 07/02/2021) for New pt f/u OA, gout allopurinol restart.   Collier Salina, MD  Note - This record has been created using [employer].  Chart creation errors have been sought, but may not always  have been located. Such creation errors do not reflect on  the standard of medical care.

## 2021-06-04 NOTE — Patient Instructions (Signed)
Allopurinol Tablets Qu es este medicamento? El ALOPURINOL reduce la cantidad de cido rico producido por el cuerpo. Este medicamento se [place] para tratar los sntomas de la gota. Tambin se [place] para tratar o [employer] altos niveles de cido rico que se presentan como resultado de algunos tipos de quimioterapia. Este medicamento puede ayudar alos pacientes que tienen clculos renales frecuentemente. Este medicamento puede ser utilizado para otros usos; si tiene Engineer, petroleum preguntaconsulte con su proveedor de atencin mdica o con su farmacutico. Este medicamento puede ser utilizado para otros usos; si tiene Engineer, petroleum preguntaconsulte con su proveedor de atencin mdica o con su farmacutico. MARCAS COMUNES: Zyloprim Qu le debo informar a mi profesional de la salud antes de tomar estemedicamento? Necesitan saber si usted presenta alguno de los siguientes problemas osituaciones: enfermedad renal enfermedad heptica una reaccin alrgica o inusual al alopurinol, a otros medicamentos, alimentos, colorantes o conservantes si est embarazada o buscando quedar embarazada si est amamantando a un beb Cmo debo utilizar este medicamento? Tome este medicamento por va oral con un vaso de agua. Siga las instrucciones de la etiqueta del [place]. Si el Transport planner, tmelo con alimentos o con [place]. Tome sus dosis a intervalosregulares. No tome su medicamento con una frecuencia mayor a la indicada. Hable con su pediatra para informarse acerca del uso de este medicamento en nios. Puede requerir atencin especial. Aunque este medicamento ha sido recetado por nios tan menores como de 6 aos de edad para condicionesselectivas, las precauciones se aplican. Sobredosis: Pngase en contacto inmediatamente con un centro toxicolgico o unasala de urgencia si usted cree que haya tomado demasiado medicamento. ATENCIN: [employer] es solo para usted. No comparta este  medicamento connadie. Sobredosis: Pngase en contacto inmediatamente con un centro toxicolgico o unasala de urgencia si usted cree que haya tomado demasiado medicamento. ATENCIN: [employer] es solo para usted. No comparta este medicamento connadie. Qu sucede si me olvido de una dosis? Si olvida una dosis, tmela lo antes posible. Si es casi la hora de la prximadosis, tome slo esa dosis. No tome dosis adicionales o dobles. Qu puede interactuar con este medicamento? No tome esta medicina con los siguientes medicamentos: didanosina, ddI Esta medicina tambin puede interactuar con los siguientes medicamentos: ciertos antibiticos como amoxicilina, ampicilina ciertos medicamentos para el cncer ciertos medicamentos para inmunosupresin, tales como azatioprina, ciclosporina, mercaptopurina clorpropamida probenecida diurticos tiazdicos, tales como hidroclorotiazida sulfinpirazona warfarina Puede ser que esta lista no menciona todas las posibles interacciones. Informe a su profesional de KB Home	[place] de [employer] productos a base de hierbas, medicamentos de [place] o suplementos nutritivos que est tomando. Si usted fuma, consume bebidas alcohlicas o si utiliza drogas ilegales, indqueselo tambin a su profesional de KB Home	[place]. Algunas sustancias pueden interactuar consu medicamento. Puede ser que esta lista no menciona todas las posibles interacciones. Informe a su profesional de [employer] Home	[place] de [employer] Corporation productos a base de hierbas, medicamentos de [place] o suplementos nutritivos que est tomando. Si usted fuma, consume bebidas alcohlicas o si utiliza drogas ilegales, indqueselo tambin a su profesional de KB Home	[place]. Algunas sustancias pueden interactuar consu medicamento. A qu debo estar atento al usar Coca-Cola? Visite a su mdico o a su proveedor de atencin mdica para que revise regularmente su evolucin. Si est tomando este medicamento para tratar la gota, es posible  que al principio la frecuencia de los ataques no [place]. Siga tomando su medicamento en forma regular; los ataques deberan mejorar en 2 a 6 semanas. Bonnita Nasuti  abundante agua (10 a 12 vasos llenos por da) mientras [place] Coca-Cola. Esto le ayudar a Market researcher y el riesgode tener gota o clculos renales. Llame a su mdico o a su proveedor de atencin mdica de inmediato si tiene una erupcin en la piel junto con escalofros, fiebre, dolor de garganta, o nuseasy vmitos, si tiene [employer] Brands orina o dificultad para Garment/textile technologist. Este medicamento puede causar reacciones graves en la piel. Pueden suceder semanas a meses despus de comenzar a Environmental consultant. Contacte a su proveedor de atencin mdica de inmediato si nota que tiene fiebre o sntomas gripales con una erupcin. La erupcin puede ser roja o Phill Myron, y luego puede convertirse en ampollas o descamacin de la piel. O bien, es posible que observe una erupcin roja con hinchazn en la cara, los labios o los ganglioslinfticos en el cuello o debajo de los brazos. No tome vitamina C sin antes preguntarle a su mdico o a su proveedor de atencin mdica. Demasiada vitamina C puede aumentar las probabilidades detener clculos renales. Puede experimentar somnolencia o mareos. No conduzca, no utilice maquinaria ni haga nada que Associate Professor en estado de alerta hasta que sepa cmo le afecta este frmaco. No se siente ni se ponga de pie con rapidez, especialmente si es un paciente de edad avanzada. Esto reduce el riesgo de mareos o [employer]. El alcohol puede aumentar los mareos y la somnolencia. El alcohol tambin puede aumentar las posibilidades de problemas estomacales y la cantidad de cidorico en la [place]. Evite consumir bebidas alcohlicas. Qu efectos secundarios puedo tener al [employer] este medicamento? Efectos secundarios que debe informar a su mdico o a su profesional de lasalud tan pronto como sea posible: Ecologist, como erupcin cutnea, comezn/picazn o urticaria, e hinchazn de la cara, los labios o la lengua problemas para Contractor en las articulaciones dolor muscular erupcin, fiebre y ganglios linfticos inflamados enrojecimiento, formacin de ampollas, descamacin o distensin de la piel, incluso dentro de la boca signos y sntomas de infeccin, como fiebre o escalofros, tos, Social research officer, government de garganta signos y sntomas de lesin al rin, tales como dificultad para Garment/textile technologist o cambios en la cantidad de orina, Social research officer, government en el costado hormigueo, entumecimiento de las manos o los pies sangrado o moretones inusuales cansancio o debilidad inusual Efectos secundarios que generalmente no requieren atencin mdica (infrmelos asu mdico o a su profesional de la salud si persisten o si son molestos): cambios en el sentido del gusto diarrea somnolencia dolor de cabeza nuseas, vmito Higher education careers adviser Puede ser que esta lista no menciona todos los posibles efectos secundarios. Comunquese a su mdico por asesoramiento mdico [employer] Inc. Usted puede informar los efectos secundarios a la FDA por telfono al1-800-FDA-1088. Puede ser que esta lista no menciona todos los posibles efectos secundarios. Comunquese a su mdico por asesoramiento mdico Humana Inc. Usted puede informar los efectos secundarios a la FDA por telfono al1-800-FDA-1088. Dnde debo guardar mi medicina? Mantngala fuera del alcance de los nios. Gurdela a [employer], entre 15 y 31 grados C (70 y 63 grados F). Protjala de la luz y la humedad. Deseche todo el medicamento que no haya utilizado, despus de la fecha de vencimiento.ATENCIN:Este folleto es un resumen. Puede ser que no cubra toda la posible informacin. Si usted tiene preguntas acerca de esta medicina, consulte con su mdico, su farmacutico o suprofesional de Technical sales engineer.  Inyeccin de corticoesteroides en las  articulaciones Joint Steroid Injection Una inyeccin de  corticoesteroides en una articulacin es un procedimiento que se realiza para Human resources officer hinchazn y Conservation officer, historic buildings en una articulacin. Los corticoesteroides son medicamentos que Air cabin crew. En este procedimiento, el mdico [place] y [place] para inyectar un medicamentos con corticoesteroides en una articulacin dolorida e inflamada. Es posible que, junto con el corticoesteroide, se inyecte un medicamento para Best boy (anestesia). En algunos casos, el mdico puede usar una tcnica de diagnstico porimgenes, como una ecografa o fluoroscopia, para guiar la inyeccin. Las articulaciones que suelen tratarse con inyecciones de corticoesteroides son la rodilla, el hombro, la cadera y la columna vertebral. Estas inyecciones tambin pueden aplicarse en el codo, el tobillo y las articulaciones de las manos o de los pies. Es posible que le apliquen inyecciones de corticoesteroides en una articulacin como parte del tratamiento para la inflamacin causada por lo siguiente: Gota. Artritis reumatoide. Artritis avanzada debido al deterioro (artrosis). Tendinitis. Bursitis. Las inyecciones de corticoesteroides en una articulacin pueden repetirse, pero aplicarlas con demasiada frecuencia puede daar la articulacin o la piel sobre la articulacin. No debe recibir inyecciones de corticoesteroides en unaarticulacin con menos de 6 semanas de diferencia o ms de cuatro veces al ao. Informe al mdico acerca de lo siguiente: Cualquier alergia que tenga. Todos los [employer] [place], incluidos vitaminas, hierbas, gotas oftlmicas, cremas y medicamentos de venta libre. Problemas previos que usted o algn miembro de su familia hayan tenido con los anestsicos. Cualquier trastorno de la sangre que tenga. Cirugas a las que se haya sometido. Cualquier afeccin mdica que tenga. Si est embarazada o podra estarlo. Cules son los  riesgos? En general, es un tratamiento seguro. Sin embargo, pueden ocurrir complicaciones, por ejemplo: Infeccin. Sangrado. Reacciones alrgicas a los medicamentos. Dao en la articulacin o los tejidos que rodean la articulacin. Adelgazamiento de la piel o prdida del color de la piel sobre la articulacin. Sofoco temporario en [place]. Aumento temporario del Social research officer, government. Aumento temporario del nivel de Dispensing optician. Incapacidad de Best boy. Qu sucede antes del tratamiento? Medicamentos Consulte al mdico sobre: Quarry manager o suspender los medicamentos que [place] habitualmente. Esto es muy importante si toma medicamentos para la diabetes o anticoagulantes. Tomar medicamentos como aspirina e ibuprofeno. Estos medicamentos pueden tener un efecto anticoagulante en la [place]. No tome estos medicamentos a menos que el mdico se lo indique. Usar medicamentos de venta libre, vitaminas, hierbas y suplementos. Indicaciones generales Es posible que le hagan estudios de diagnstico por imgenes de Water engineer. Pregntele al mdico si podr irse a casa conduciendo despus del procedimiento. Qu ocurre durante el tratamiento?  El mdico lo colocar en posicin para aplicarle la inyeccin y [place] el sitio de la inyeccin sobre la articulacin. Le limpiarn la piel sobre la articulacin con un jabn antisptico. El mdico tambin podr hacer lo siguiente: Rociar una solucin para insensibilizar (anestesia tpica) sobre el sitio de la inyeccin. Inyectar anestesia local debajo de la piel por encima de la articulacin. La aguja atravesar la piel para llegar al [employer]. El mdico puede usar tcnicas de diagnstico por imgenes para guiar la aguja Licensed conveyancer correcto para la inyeccin. Si se utilizan tcnicas de diagnstico por imgenes, se puede inyectar un tinte de contraste especial para confirmar que la aguja est en la ubicacin correcta. El medicamento con  corticoesteroides se Futures trader. Es posible que se inyecte anestesia junto con el corticoesteroide. Puede ser un medicamento que [place] el dolor durante  un tiempo breve (anestesia de accin corta) o por ms tiempo (anestesia de accin prolongada). Luego, retirarn la aguja y colocarn un vendaje Scientist, research (medical) (apsito) sobre el sitio de la inyeccin. El procedimiento puede variar segn el mdico y el hospital. Sander Nephew podra pasar despus del tratamiento? Podr volver a su casa despus del tratamiento. Es normal sentir un leve sofoco durante [employer] despus de la inyeccin. Despus del tratamiento, es comn tener un aumento del dolor en la articulacin despus de que desaparezca el efecto de la anestesia. Esto puede suceder alrededor de Ardelia Mems hora despus de una anestesia de accin corta o aproximadamente 8 horas despus de una anestesia de accin prolongada. Debera comenzar a sentir Publishing copy y la hinchazn en la articulacin luego de 24 a 64 horas. Pngase en contacto con el mdico si no comienza a sentir alivio despus de 2 das. Siga estas instrucciones en su casa: Cuidado del sitio de la inyeccin Deje el vendaje [employer] sobre el sitio de la inyeccin hasta que el mdico le diga que puede quitrselo. Controle el sitio de la inyeccin todos los das para detectar signos de infeccin. Est atento a los siguientes signos: Aumento del enrojecimiento, la hinchazn o Conservation officer, historic buildings. Lquido o sangre. Calor. Pus o mal olor. Actividad Retome sus actividades normales segn lo indicado por el mdico. Pregntele al mdico qu actividades son seguras para usted. Es posible que se le pida que limite las actividades que sobrecargan la articulacin durante [place]. Haga ejercicios con la articulacin como se lo haya indicado el mdico. No tome baos de inmersin, no nade ni use el jacuzzi hasta que el mdico lo autorice. Pregntele al mdico si puede ducharse. Thurston Pounds solo le permitan  darse baos de [place]. Control del dolor, la rigidez y la hinchazn  Si se lo indican, aplique hielo Herbalist. Para hacer esto: Ponga el hielo en una bolsa plstica. Coloque una toalla entre la piel y Therapist, nutritional. Aplique el hielo durante 20 minutos, 2 o 3 veces por da. Retire el hielo si la piel se pone de color rojo brillante. Esto es [employer]. Si no puede sentir dolor, calor o fro, tiene un mayor riesgo de que se dae la zona. Cuando est sentado o acostado, levante (eleve) la articulacin por encima del nivel del corazn.  Indicaciones generales Use los medicamentos de venta libre y los recetados solamente como se lo haya indicado el mdico. No consuma ningn producto que contenga nicotina o tabaco, como cigarrillos, cigarrillos electrnicos y tabaco de Higher education careers adviser. Estos pueden retrasar la recuperacin de Water engineer. Si necesita ayuda para dejar de consumir estos productos, consulte al [employer]. Si tiene diabetes, tenga en cuenta que el nivel de azcar en la sangre puede estar ligeramente elevado durante varios das despus de la inyeccin. Cumpla con todas las visitas de seguimiento. Esto es importante. Comunquese con un mdico si tiene: Escalofros o fiebre. Cualquier signo de infeccin en el sitio de la inyeccin. Aumento del dolor o la hinchazn o falta de alivio despus de 2 das. Resumen Una inyeccin de corticoesteroides en una articulacin es un tratamiento para Best boy y la hinchazn en una articulacin. Los corticoesteroides son medicamentos que Air cabin crew. El mdico puede agregar anestesia junto con el corticoesteroide. Es posible que le apliquen inyecciones de corticoesteroides en la articulacin como parte del tratamiento para la artritis. Las inyecciones de corticoesteroides en una articulacin pueden repetirse, pero aplicarlas con demasiada frecuencia puede daar la articulacin o la piel sobre  la articulacin. Comunquese con el  mdico si tiene fiebre, escalofros o signos de infeccin, o si no se Ship broker o la hinchazn en la articulacin.  Gota Gout  La gota es una afeccin que causa la hinchazn dolorosa de las articulaciones. La gota es un tipo de inflamacin de las articulaciones (artritis). Esta afeccin es causada por la acumulacin de cido rico en el cuerpo. El cido rico es una sustancia qumica que se forma cuando el cuerpo descompone sustancias llamadas purinas. Las purinas son importantes para la fabricacin delas protenas del cuerpo. Cuando el cuerpo tiene un exceso de cido rico, se pueden formar cristales con punta y Education officer, museum interior de las articulaciones. Esto provoca dolor e hinchazn. Las crisis de gota pueden ocurrir rpidamente y ser muy dolorosas (gota [place]). Con el tiempo, las crisis pueden afectar ms articulaciones y volverse ms frecuentes (gota crnica). La gota tambin puede provocar la acumulacin de cido rico debajo de lapiel y en los riones. Cules son las causas? Esta afeccin es causada por la acumulacin de cido rico en la sangre. Esto puede ocurrir debido a lo siguiente: Los riones no [employer] [place] [employer] cantidad suficiente de cido rico de Herbalist. Esta es la causa ms frecuente. El cuerpo produce demasiado cido rico. Esto puede ocurrir con algunos tipos de cncer y tratamientos para Science writer. Tambin puede ocurrir si el cuerpo est descomponiendo demasiados glbulos rojos (anemia hemoltica). Come demasiados alimentos ricos en purinas. Estos alimentos incluyen vsceras y [employer]. Las bebidas alcohlicas, en especial la cerveza, tambin son ricas en purinas. Ardelia Mems crisis de gota puede desencadenarse debido a un traumatismo o Psychologist, forensic. Qu incrementa el riesgo? Es ms probable que tenga esta afeccin si: Tiene antecedentes familiares de gota. Es hombre de [place]. Es mujer que ha atravesado la menopausia. Tiene obesidad. Bebe alcohol con frecuencia,  en especial, cerveza. Est deshidratado. Pierde peso con demasiada rapidez. Le han hecho un trasplante de un rgano. Se ha intoxicado con plomo. [place] determinados medicamentos, como aspirina, ciclosporina, diurticos, levodopa y niacina. Tiene enfermedades renales. Tiene una afeccin de la piel llamada psoriasis. Cules son los signos o los sntomas? Un ataque de gota aguda sucede de repente. Normalmente ocurre solo en Insurance claims handler. El lugar ms comn es el pulgar del pie. Las crisis a menudo comienzan por la noche. Tambin pueden verse afectadas las articulaciones de los pies, los tobillos, las rodillas, los dedos de la mano, las muecas o los codos. Los sntomas de esta afeccin pueden incluir los siguientes: Dolor intenso. Calor. Hinchazn. Entumecimiento. Dolor a Secretary/administrator. Se puede sentir mucho dolor al tacto en la articulacin afectada. Piel brillosa, roja o morada. Cristy Hilts y escalofros. La gota crnica puede provocar sntomas con ms frecuencia. Pueden verse involucradas ms articulaciones. Es posible que tambin tenga bultos blancos o amarillos (tofos) en las manos o los pies, o en otras zonas cercanas a las articulaciones. Cmo se diagnostica? Esta afeccin se diagnostica en funcin de los sntomas, los antecedentes mdicos y un examen fsico. Pueden hacerle estudios, por ejemplo: Anlisis de sangre para [employer] de cido rico. Counselling psychologist de lquido sinovial con una aguja delgada (aspiracin) para buscar la presencia de cristales de cido rico. Radiografas para observar la articulacin daada. Cmo se trata? El tratamiento para esta afeccin tiene dos fases: tratar un ataque agudo y prevenir ataques futuros. El tratamiento de la gota aguda puede incluir medicamentos para reducir Conservation officer, historic buildings y la hinchazn, por ejemplo: Antiinflamatorios no esteroideos (AINE). Corticoesteroides. Estos son  medicamentos antiinflamatorios fuertes que se pueden tomar por va oral por  boca o se pueden inyectar en una articulacin. Colchicina. Este medicamento [employer] dolor y la hinchazn cuando se [place] inmediatamente despus de Ardelia Mems crisis. Puede administrarse por la boca o por una va intravenosa. El tratamiento preventivo puede incluir lo siguiente: El uso diario de dosis ms bajas de AINE o colchicina. El uso de un medicamento que reduce los niveles de cido rico en la [place]. Cambios en la dieta. Es posible que deba consultar a un nutricionista acerca de lo que debe comer y beber para Biochemist, clinical. Siga estas indicaciones en su casa: Durante una crisis de gota  Si se lo indican, aplique hielo sobre la zona afectada: Ponga el hielo en una bolsa plstica. Coloque una toalla entre la piel y Therapist, nutritional. Coloque el hielo durante 20 minutos, 2 a 3 veces por da. Levante (eleve) la articulacin afectada por encima del nivel del corazn tantas veces como le sea posible. Haga reposo todo el tiempo que pueda. Si la articulacin afectada se encuentra en la pierna, quiz deba usar muletas. Siga las indicaciones del mdico respecto de las restricciones en las comidas o las bebidas.  Cmo evitar las crisis de gota en el futuro Siga una dieta baja en purinas como se lo haya indicado el nutricionista o el mdico. Evite alimentos y bebidas con alto contenido de purinas, por ejemplo, hgado, rin, anchoas, esprragos, arenque, hongos, mejillones y Chartered certified accountant. Mantenga un peso saludable o pierda peso si tiene sobrepeso. Si quiere perder peso, hable con el mdico. Es importante que no pierda peso demasiado rpido. Comience o siga un programa de actividad fsica como se lo haya indicado el mdico. Comida y bebida Beba suficiente lquido para Theatre manager la orina de color amarillo plido. Si bebe alcohol: Limite la cantidad que bebe a lo siguiente: De 0 a 1 medida por da para las mujeres. De 0 a 2 medidas por da para los hombres. Est atento a la cantidad de alcohol que hay en las bebidas  que toma. En los [place], una medida equivale a una botella de cerveza de 12 oz (355 ml), un vaso de vino de 5 oz (148 ml) o un vaso de una bebida alcohlica de alta graduacin de 1 oz (44 ml). Indicaciones generales [employer] de venta libre y los recetados solamente como se lo haya indicado el mdico. No conduzca ni use maquinaria pesada mientras toma analgsicos recetados. Retome sus actividades normales segn lo indicado por el mdico. Pregntele al mdico qu actividades son seguras para usted. Concurra a todas las visitas de control como se lo haya indicado el mdico. Esto es importante. Comunquese con un mdico si tiene: Otra crisis de gota. Sntomas de [place] crisis de gota que continan despus de 10 das de [place]. Efectos secundarios provocados por los medicamentos. Escalofros o fiebre. Dolor urente al [employer]. Dolor en la parte inferior de la espalda o en el vientre. Solicite ayuda inmediatamente si: Siente dolor intenso o incontrolable. No puede orinar. Resumen La gota es una hinchazn dolorosa de las articulaciones causada por una inflamacin. El lugar ms habitual donde se presenta el dolor es el dedo gordo del pie, pero tambin pueden verse afectadas otras articulaciones del cuerpo. [place] y los cambios en la dieta pueden ayudar a Product/process development scientist y tratar las crisis de [place]. Esta informacin no tiene Marine scientist el consejo del mdico. Asegresede hacerle al mdico cualquier pregunta que tenga. Document Revised: 07/29/2018 Document Reviewed:  07/29/2018 Elsevier Patient Education  2022 [employer].

## 2021-06-05 MED ORDER — LIDOCAINE HCL 1 % IJ SOLN
3.0000 mL | INTRAMUSCULAR | Status: AC | PRN
  Administered 2021-06-04: 3 mL

## 2021-06-05 MED ORDER — TRIAMCINOLONE ACETONIDE 40 MG/ML IJ SUSP
40.0000 mg | INTRAMUSCULAR | Status: AC | PRN
  Administered 2021-06-04: 40 mg via INTRA_ARTICULAR

## 2021-07-08 NOTE — Progress Notes (Deleted)
Office Visit Note  [name]             Date of Birth: [birth year]           MRN: [id number]             PCP: Emelia Loron, NP Referring: Emelia Loron, NP Visit Date: 07/09/2021   Subjective:  No chief complaint on file.   History of Present Illness: Eugene Cooper is a 80 y.o. male here for follow up for gout and osteoarthritis with restarting allopurinol 300 mg per day and left subacromial bursa steroid injection 1 month ago.***   06/04/21 Eugene Cooper is a 80 y.o. 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 He noticed episodes of acute pain and swelling ongoing for up to about 3 years most commonly provoked when eating red meats. 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 He was taking allopurinol previously but is no longer, without any specific intolerance or indication for discontinuing this. His feet are currently well without joint pain or swelling today. Besides these episodes of gouty arthritis he also notices worsening of left shoulder pain chronic but worsening more for several weeks. This has not varying in any particular correlation with his foot inflammation. He denies any particular injury associated with increase in this problem. He has had previous intraarticular steroid injection for his knees but never in the upper extremities.   04/2021 Uric acid 10.9 BUN 13 sCr 1.3 eGFR 53   No Rheumatology ROS completed.   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 Social History   Social History Narrative   Not on file   Immunization History  Administered Date(s) Administered   Fluad Quad(high Dose 65+) 12/21/2019   Pneumococcal Polysaccharide-23 12/21/2019     Objective: Vital Signs: There were no vitals taken for this visit.   Physical Exam   Musculoskeletal Exam: ***  CDAI Exam: CDAI Score: -- Patient Global: --; Provider Global: -- Swollen: --; Tender: -- Joint Exam 07/09/2021   No joint exam has been documented for this visit   There is currently no information documented on the homunculus. Go to the Rheumatology activity and complete the homunculus  joint exam.  Investigation: No additional findings.  Imaging: No results found.  Recent Labs: Lab Results  Component Value Date   WBC 9.6 01/03/2020   HGB 14.6 01/03/2020   PLT 404 (H) 01/03/2020   NA 140 03/30/2020   K 4.5 03/30/2020   CL 101 03/30/2020   CO2 25 03/30/2020   GLUCOSE 105 (H) 03/30/2020   BUN 10 03/30/2020   CREATININE 0.93 03/30/2020   BILITOT 0.7 05/16/2020   ALKPHOS 105 05/16/2020   AST 29 05/16/2020   ALT 75 (H) 05/16/2020   PROT 6.7 05/16/2020   ALBUMIN 4.3 05/16/2020   CALCIUM 9.8 03/30/2020   GFRAA 90 03/30/2020    Speciality Comments: No specialty comments available.  Procedures:  No procedures  performed Allergies: Patient has no known allergies.   Assessment / Plan:     Visit Diagnoses: No diagnosis found.  ***  Orders: No orders of the defined types were placed in this encounter.  No orders of the defined types were placed in this encounter.    Follow-Up Instructions: No follow-ups on file.   Collier Salina, MD  Note - This record has been created using [employer].  Chart creation errors have been sought, but may not always  have been located. Such creation errors do not reflect on  the standard of medical care.

## 2021-07-09 ENCOUNTER — Ambulatory Visit: Payer: Medicare Other | Admitting: Internal Medicine

## 2021-10-28 ENCOUNTER — Emergency Department (HOSPITAL_COMMUNITY): Payer: Medicare Other

## 2021-10-28 ENCOUNTER — Encounter (HOSPITAL_COMMUNITY): Payer: Self-pay | Admitting: Emergency Medicine

## 2021-10-28 ENCOUNTER — Other Ambulatory Visit: Payer: Self-pay

## 2021-10-28 ENCOUNTER — Inpatient Hospital Stay (HOSPITAL_COMMUNITY)
Admission: EM | Admit: 2021-10-28 | Discharge: 2021-11-10 | DRG: 871 | Disposition: A | Discharge status: Home Health | Payer: Medicare Other | Attending: Internal Medicine | Admitting: Internal Medicine

## 2021-10-28 DIAGNOSIS — E8729 Other acidosis: Secondary | ICD-10-CM

## 2021-10-28 DIAGNOSIS — G9341 Metabolic encephalopathy: Secondary | ICD-10-CM | POA: Diagnosis present

## 2021-10-28 DIAGNOSIS — Z7984 Long term (current) use of oral hypoglycemic drugs: Secondary | ICD-10-CM

## 2021-10-28 DIAGNOSIS — D649 Anemia, unspecified: Secondary | ICD-10-CM

## 2021-10-28 DIAGNOSIS — I129 Hypertensive chronic kidney disease with stage 1 through stage 4 chronic kidney disease, or unspecified chronic kidney disease: Secondary | ICD-10-CM | POA: Diagnosis present

## 2021-10-28 DIAGNOSIS — Z823 Family history of stroke: Secondary | ICD-10-CM

## 2021-10-28 DIAGNOSIS — D696 Thrombocytopenia, unspecified: Secondary | ICD-10-CM | POA: Diagnosis not present

## 2021-10-28 DIAGNOSIS — M109 Gout, unspecified: Secondary | ICD-10-CM | POA: Diagnosis present

## 2021-10-28 DIAGNOSIS — A419 Sepsis, unspecified organism: Secondary | ICD-10-CM | POA: Diagnosis present

## 2021-10-28 DIAGNOSIS — R040 Epistaxis: Secondary | ICD-10-CM | POA: Diagnosis present

## 2021-10-28 DIAGNOSIS — M25569 Pain in unspecified knee: Secondary | ICD-10-CM

## 2021-10-28 DIAGNOSIS — D469 Myelodysplastic syndrome, unspecified: Secondary | ICD-10-CM

## 2021-10-28 DIAGNOSIS — I471 Supraventricular tachycardia: Secondary | ICD-10-CM | POA: Diagnosis not present

## 2021-10-28 DIAGNOSIS — I9589 Other hypotension: Secondary | ICD-10-CM | POA: Diagnosis present

## 2021-10-28 DIAGNOSIS — Z20822 Contact with and (suspected) exposure to covid-19: Secondary | ICD-10-CM | POA: Diagnosis present

## 2021-10-28 DIAGNOSIS — E872 Acidosis, unspecified: Secondary | ICD-10-CM | POA: Diagnosis present

## 2021-10-28 DIAGNOSIS — E440 Moderate protein-calorie malnutrition: Secondary | ICD-10-CM | POA: Insufficient documentation

## 2021-10-28 DIAGNOSIS — E875 Hyperkalemia: Secondary | ICD-10-CM

## 2021-10-28 DIAGNOSIS — R509 Fever, unspecified: Secondary | ICD-10-CM

## 2021-10-28 DIAGNOSIS — N1831 Chronic kidney disease, stage 3a: Secondary | ICD-10-CM | POA: Diagnosis present

## 2021-10-28 DIAGNOSIS — X58XXXA Exposure to other specified factors, initial encounter: Secondary | ICD-10-CM | POA: Diagnosis present

## 2021-10-28 DIAGNOSIS — I4891 Unspecified atrial fibrillation: Secondary | ICD-10-CM

## 2021-10-28 DIAGNOSIS — D61818 Other pancytopenia: Secondary | ICD-10-CM

## 2021-10-28 DIAGNOSIS — E871 Hypo-osmolality and hyponatremia: Secondary | ICD-10-CM | POA: Diagnosis present

## 2021-10-28 DIAGNOSIS — Z8249 Family history of ischemic heart disease and other diseases of the circulatory system: Secondary | ICD-10-CM

## 2021-10-28 DIAGNOSIS — I447 Left bundle-branch block, unspecified: Secondary | ICD-10-CM | POA: Diagnosis present

## 2021-10-28 DIAGNOSIS — Z789 Other specified health status: Secondary | ICD-10-CM | POA: Diagnosis not present

## 2021-10-28 DIAGNOSIS — J189 Pneumonia, unspecified organism: Secondary | ICD-10-CM | POA: Diagnosis present

## 2021-10-28 DIAGNOSIS — E1122 Type 2 diabetes mellitus with diabetic chronic kidney disease: Secondary | ICD-10-CM | POA: Diagnosis present

## 2021-10-28 DIAGNOSIS — D709 Neutropenia, unspecified: Secondary | ICD-10-CM | POA: Diagnosis present

## 2021-10-28 DIAGNOSIS — R6521 Severe sepsis with septic shock: Secondary | ICD-10-CM | POA: Diagnosis present

## 2021-10-28 DIAGNOSIS — R5081 Fever presenting with conditions classified elsewhere: Secondary | ICD-10-CM | POA: Diagnosis present

## 2021-10-28 DIAGNOSIS — N179 Acute kidney failure, unspecified: Secondary | ICD-10-CM

## 2021-10-28 DIAGNOSIS — D62 Acute posthemorrhagic anemia: Secondary | ICD-10-CM | POA: Diagnosis present

## 2021-10-28 DIAGNOSIS — E861 Hypovolemia: Secondary | ICD-10-CM | POA: Diagnosis present

## 2021-10-28 DIAGNOSIS — Z6829 Body mass index (BMI) 29.0-29.9, adult: Secondary | ICD-10-CM

## 2021-10-28 DIAGNOSIS — Z8673 Personal history of transient ischemic attack (TIA), and cerebral infarction without residual deficits: Secondary | ICD-10-CM | POA: Diagnosis not present

## 2021-10-28 DIAGNOSIS — Z79899 Other long term (current) drug therapy: Secondary | ICD-10-CM

## 2021-10-28 DIAGNOSIS — Z7982 Long term (current) use of aspirin: Secondary | ICD-10-CM

## 2021-10-28 DIAGNOSIS — K259 Gastric ulcer, unspecified as acute or chronic, without hemorrhage or perforation: Secondary | ICD-10-CM

## 2021-10-28 DIAGNOSIS — R40244 Other coma, without documented Glasgow coma scale score, or with partial score reported, unspecified time: Secondary | ICD-10-CM | POA: Diagnosis not present

## 2021-10-28 DIAGNOSIS — I959 Hypotension, unspecified: Secondary | ICD-10-CM

## 2021-10-28 DIAGNOSIS — R0602 Shortness of breath: Secondary | ICD-10-CM | POA: Diagnosis present

## 2021-10-28 DIAGNOSIS — E1165 Type 2 diabetes mellitus with hyperglycemia: Secondary | ICD-10-CM | POA: Diagnosis not present

## 2021-10-28 DIAGNOSIS — K3189 Other diseases of stomach and duodenum: Secondary | ICD-10-CM | POA: Diagnosis not present

## 2021-10-28 DIAGNOSIS — R627 Adult failure to thrive: Secondary | ICD-10-CM | POA: Diagnosis present

## 2021-10-28 DIAGNOSIS — E781 Pure hyperglyceridemia: Secondary | ICD-10-CM | POA: Diagnosis present

## 2021-10-28 DIAGNOSIS — K2971 Gastritis, unspecified, with bleeding: Secondary | ICD-10-CM | POA: Diagnosis present

## 2021-10-28 DIAGNOSIS — R4182 Altered mental status, unspecified: Secondary | ICD-10-CM | POA: Diagnosis not present

## 2021-10-28 DIAGNOSIS — M1712 Unilateral primary osteoarthritis, left knee: Secondary | ICD-10-CM | POA: Diagnosis present

## 2021-10-28 DIAGNOSIS — K319 Disease of stomach and duodenum, unspecified: Secondary | ICD-10-CM | POA: Diagnosis present

## 2021-10-28 DIAGNOSIS — S76112A Strain of left quadriceps muscle, fascia and tendon, initial encounter: Secondary | ICD-10-CM | POA: Diagnosis present

## 2021-10-28 DIAGNOSIS — M25561 Pain in right knee: Secondary | ICD-10-CM | POA: Diagnosis not present

## 2021-10-28 DIAGNOSIS — J181 Lobar pneumonia, unspecified organism: Secondary | ICD-10-CM | POA: Diagnosis not present

## 2021-10-28 DIAGNOSIS — Z833 Family history of diabetes mellitus: Secondary | ICD-10-CM

## 2021-10-28 DIAGNOSIS — R911 Solitary pulmonary nodule: Secondary | ICD-10-CM | POA: Diagnosis present

## 2021-10-28 HISTORY — DX: Left bundle-branch block, unspecified: I44.7

## 2021-10-28 HISTORY — DX: Chronic kidney disease, stage 3 unspecified: N18.30

## 2021-10-28 LAB — COMPREHENSIVE METABOLIC PANEL
ALT: 23 U/L (ref 0–44)
AST: 26 U/L (ref 15–41)
Albumin: 2.8 g/dL — ABNORMAL LOW (ref 3.5–5.0)
Alkaline Phosphatase: 78 U/L (ref 38–126)
Anion gap: 16 — ABNORMAL HIGH (ref 5–15)
BUN: 65 mg/dL — ABNORMAL HIGH (ref 8–23)
CO2: 14 mmol/L — ABNORMAL LOW (ref 22–32)
Calcium: 8.6 mg/dL — ABNORMAL LOW (ref 8.9–10.3)
Chloride: 102 mmol/L (ref 98–111)
Creatinine, Ser: 3.54 mg/dL — ABNORMAL HIGH (ref 0.61–1.24)
GFR, Estimated: 17 mL/min — ABNORMAL LOW (ref >60)
Glucose, Bld: 250 mg/dL — ABNORMAL HIGH (ref 70–99)
Potassium: 6.2 mmol/L — ABNORMAL HIGH (ref 3.5–5.1)
Sodium: 132 mmol/L — ABNORMAL LOW (ref 135–145)
Total Bilirubin: 1 mg/dL (ref 0.3–1.2)
Total Protein: 5.8 g/dL — ABNORMAL LOW (ref 6.5–8.1)

## 2021-10-28 LAB — URINALYSIS, ROUTINE W REFLEX MICROSCOPIC
Bilirubin Urine: NEGATIVE
Glucose, UA: NEGATIVE mg/dL
Hgb urine dipstick: NEGATIVE
Ketones, ur: NEGATIVE mg/dL
Leukocytes,Ua: NEGATIVE
Nitrite: NEGATIVE
Protein, ur: NEGATIVE mg/dL
Specific Gravity, Urine: 1.015 (ref 1.005–1.030)
pH: 5 (ref 5.0–8.0)

## 2021-10-28 LAB — CBC WITH DIFFERENTIAL/PLATELET
Abs Immature Granulocytes: 0.6 10*3/uL — ABNORMAL HIGH (ref 0.00–0.07)
Band Neutrophils: 7 %
Basophils Absolute: 0 10*3/uL (ref 0.0–0.1)
Basophils Relative: 0 %
Eosinophils Absolute: 0 10*3/uL (ref 0.0–0.5)
Eosinophils Relative: 0 %
HCT: 19.8 % — ABNORMAL LOW (ref 39.0–52.0)
Hemoglobin: 6.4 g/dL — CL (ref 13.0–17.0)
Lymphocytes Relative: 25 %
Lymphs Abs: 1.8 10*3/uL (ref 0.7–4.0)
MCH: 32 pg (ref 26.0–34.0)
MCHC: 32.3 g/dL (ref 30.0–36.0)
MCV: 99 fL (ref 80.0–100.0)
Metamyelocytes Relative: 4 %
Monocytes Absolute: 0.8 10*3/uL (ref 0.1–1.0)
Monocytes Relative: 11 %
Myelocytes: 4 %
Neutro Abs: 3.9 10*3/uL (ref 1.7–7.7)
Neutrophils Relative %: 48 %
Platelets: 101 10*3/uL — ABNORMAL LOW (ref 150–400)
Promyelocytes Relative: 1 %
RBC: 2 MIL/uL — ABNORMAL LOW (ref 4.22–5.81)
RDW: 17.2 % — ABNORMAL HIGH (ref 11.5–15.5)
WBC: 7.1 10*3/uL (ref 4.0–10.5)
nRBC: 0.4 % — ABNORMAL HIGH (ref 0.0–0.2)

## 2021-10-28 LAB — ABO/RH: ABO/RH(D): O POS

## 2021-10-28 LAB — PREPARE RBC (CROSSMATCH)

## 2021-10-28 LAB — BASIC METABOLIC PANEL
Anion gap: 10 (ref 5–15)
BUN: 58 mg/dL — ABNORMAL HIGH (ref 8–23)
CO2: 21 mmol/L — ABNORMAL LOW (ref 22–32)
Calcium: 8.5 mg/dL — ABNORMAL LOW (ref 8.9–10.3)
Chloride: 105 mmol/L (ref 98–111)
Creatinine, Ser: 2.68 mg/dL — ABNORMAL HIGH (ref 0.61–1.24)
GFR, Estimated: 23 mL/min — ABNORMAL LOW (ref >60)
Glucose, Bld: 86 mg/dL (ref 70–99)
Potassium: 5 mmol/L (ref 3.5–5.1)
Sodium: 136 mmol/L (ref 135–145)

## 2021-10-28 LAB — RESP PANEL BY RT-PCR (FLU A&B, COVID) ARPGX2
Influenza A by PCR: NEGATIVE
Influenza B by PCR: NEGATIVE
SARS Coronavirus 2 by RT PCR: NEGATIVE

## 2021-10-28 LAB — LACTIC ACID, PLASMA
Lactic Acid, Venous: 6.8 mmol/L (ref 0.5–1.9)
Lactic Acid, Venous: 4.4 mmol/L (ref 0.5–1.9)

## 2021-10-28 LAB — APTT: aPTT: 35 seconds (ref 24–36)

## 2021-10-28 LAB — PROCALCITONIN: Procalcitonin: 0.43 ng/mL

## 2021-10-28 LAB — PROTIME-INR
INR: 1.3 — ABNORMAL HIGH (ref 0.8–1.2)
Prothrombin Time: 15.7 seconds — ABNORMAL HIGH (ref 11.4–15.2)

## 2021-10-28 LAB — POC OCCULT BLOOD, ED: Fecal Occult Bld: POSITIVE — AB

## 2021-10-28 MED ORDER — CALCIUM GLUCONATE 10 % IV SOLN
1.0000 g | Freq: Once | INTRAVENOUS | Status: AC
  Administered 2021-10-28: 1 g via INTRAVENOUS
  Filled 2021-10-28: qty 10

## 2021-10-28 MED ORDER — VANCOMYCIN HCL 1500 MG/300ML IV SOLN
1500.0000 mg | Freq: Once | INTRAVENOUS | Status: AC
  Administered 2021-10-28: 1500 mg via INTRAVENOUS
  Filled 2021-10-28: qty 300

## 2021-10-28 MED ORDER — LACTATED RINGERS IV BOLUS (SEPSIS)
1000.0000 mL | Freq: Once | INTRAVENOUS | Status: AC
  Administered 2021-10-28: 1000 mL via INTRAVENOUS

## 2021-10-28 MED ORDER — METRONIDAZOLE 500 MG/100ML IV SOLN
500.0000 mg | Freq: Once | INTRAVENOUS | Status: AC
  Administered 2021-10-28: 500 mg via INTRAVENOUS
  Filled 2021-10-28: qty 100

## 2021-10-28 MED ORDER — SODIUM CHLORIDE 0.9 % IV SOLN: 10.0000 mL/h | Freq: Once | INTRAVENOUS | Status: DC

## 2021-10-28 MED ORDER — ACETAMINOPHEN 650 MG RE SUPP
650.0000 mg | Freq: Once | RECTAL | Status: AC
  Administered 2021-10-28: 650 mg via RECTAL
  Filled 2021-10-28: qty 1

## 2021-10-28 MED ORDER — SODIUM CHLORIDE 0.9 % IV SOLN
2.0000 g | Freq: Once | INTRAVENOUS | Status: AC
  Administered 2021-10-28: 2 g via INTRAVENOUS
  Filled 2021-10-28: qty 2

## 2021-10-28 MED ORDER — LACTATED RINGERS IV BOLUS
1000.0000 mL | Freq: Once | INTRAVENOUS | Status: AC
  Administered 2021-10-28: 1000 mL via INTRAVENOUS

## 2021-10-28 MED ORDER — INSULIN ASPART 100 UNIT/ML IV SOLN
5.0000 [IU] | Freq: Once | INTRAVENOUS | Status: AC
  Administered 2021-10-28: 5 [IU] via INTRAVENOUS

## 2021-10-28 MED ORDER — VANCOMYCIN VARIABLE DOSE PER UNSTABLE RENAL FUNCTION (PHARMACIST DOSING): Status: DC

## 2021-10-28 MED ORDER — SODIUM BICARBONATE 8.4 % IV SOLN
50.0000 meq | Freq: Once | INTRAVENOUS | Status: AC
  Administered 2021-10-28: 50 meq via INTRAVENOUS
  Filled 2021-10-28: qty 50

## 2021-10-28 MED ORDER — SODIUM ZIRCONIUM CYCLOSILICATE 10 G PO PACK
10.0000 g | PACK | Freq: Once | ORAL | Status: DC
  Filled 2021-10-28: qty 1

## 2021-10-28 MED ORDER — PANTOPRAZOLE SODIUM 40 MG IV SOLR
40.0000 mg | Freq: Every day | INTRAVENOUS | Status: DC
  Administered 2021-10-28: 40 mg via INTRAVENOUS

## 2021-10-28 MED ORDER — SODIUM CHLORIDE 0.9 % IV SOLN
2.0000 g | INTRAVENOUS | Status: DC
  Administered 2021-10-29: 2 g via INTRAVENOUS
  Filled 2021-10-28: qty 2

## 2021-10-28 MED ORDER — ACETAMINOPHEN 500 MG PO TABS
1000.0000 mg | ORAL_TABLET | Freq: Once | ORAL | Status: DC
  Filled 2021-10-28: qty 2

## 2021-10-28 NOTE — ED Triage Notes (Signed)
Pt brought in by son from home. States pt normally is ambulatory but cannot hold himself up since yesterday.  Also has not been answering when spoken to.  States he went to a doctor today on [address] and was called later and told to bring his father to the ED.  Unclear of reasoning. EDPA at bedside and interpreter to bedside.

## 2021-10-28 NOTE — ED Notes (Signed)
Pt tolerating blood transfusion well, rate increased to 23mL/hr

## 2021-10-28 NOTE — ED Notes (Signed)
Blood consent at bedside.

## 2021-10-28 NOTE — ED Provider Notes (Signed)
[facility] Provider Note   CSN: [id number] Arrival date & time: 10/28/21  1742     History Chief Complaint  Patient presents with   Altered Mental Status    Eugene Cooper is a 80 y.o. male with a history of diabetes mellitus type 2, hypertension, CVA 2021, hyperlipidemia.  Presents to the emergency department with a chief complaint of altered mental status and low hemoglobin.  Patient is unable to answer questions due to his altered mental status, level 5 caveat applies.  Information is obtained from patient's son who is at bedside.  Spanish interpreter was used to conduct this interview.  Patient's son reports that yesterday and today patient has been confused.  He has not been talking as much or interacting with family members.  Normally patient is ANO x3.  Has had decreased oral intake over this time.  Patient has had generalized weakness.  Patient son says that the patient may have suffered a fall this morning however he is unsure.  Patient was brought to an urgent care on [address] today and after lab testing was told to come to the emergency department due to low hemoglobin.  Son denies any known sick contacts.  Denies father having any melena, blood in stool, hematuria, fever, vomiting, diarrhea.   Altered Mental Status     Past Medical History:  Diagnosis Date   Diabetes ([place])    Hyperlipemia    Hypertension    Stroke [facility])     Patient Active Problem List   Diagnosis Date Noted   Pain in left shoulder 06/04/2021   Gout 06/04/2021   Elevated BUN    Benign essential HTN    Controlled type 2 diabetes mellitus with hyperglycemia, without long-term current use of insulin (HCC)    Labile blood glucose    Neuropathic pain    Acute ischemic stroke ([place]) 12/22/2019   Uncontrolled type 2 diabetes mellitus with hyperglycemia, without long-term current use of insulin ([place]) 12/22/2019   Mixed hyperlipidemia 12/22/2019   Left  pontine stroke ([place]) 12/22/2019   Essential hypertension    Left bundle branch block    Stroke ([place]) 12/20/2019   Right sided weakness 12/19/2019    History reviewed. No pertinent surgical history.     Family History  Problem Relation Age of Onset   Stroke Mother    Lung disease Father    Diabetes Sister    Heart disease Sister    Hyperlipidemia Son    Hyperlipidemia Daughter    Hypertension Daughter     Social History   Tobacco Use   Smoking status: Never   Smokeless tobacco: Never  Vaping Use   Vaping Use: Never used  Substance Use Topics   Alcohol use: Not Currently   Drug use: Never    Home Medications Prior to Admission medications   Medication Sig Start Date End Date Taking? Authorizing Provider  Accu-Chek Softclix Lancets lancets USE UPTO FOUR TIMES DAILY AS DIRECTED 01/08/20   [provider]  acetaminophen (TYLENOL) 325 MG tablet Take 1-2 tablets (325-650 mg total) by mouth every 4 (four) hours as needed for mild pain. 01/07/20   Love, Ivan Anchors, PA-C  allopurinol (ZYLOPRIM) 300 MG tablet Take 1 tablet (300 mg total) by mouth daily. 06/04/21   Collier Salina, MD  aspirin 81 MG EC tablet Take 1 tablet (81 mg total) by mouth daily. 03/30/20   Jettie Booze, MD  atorvastatin (LIPITOR) 40 MG tablet Take 1 tablet (40  mg total) by mouth daily at 6 PM. 01/07/20   Love, Ivan Anchors, PA-C  blood glucose meter kit and supplies KIT Dispense based on patient and insurance preference. Use up to four times daily as directed. (FOR ICD-9 250.00, 250.01). 01/07/20   Love, Ivan Anchors, PA-C  gabapentin (NEURONTIN) 100 MG capsule Take 1 capsule (100 mg total) by mouth 3 (three) times daily. 01/07/20   Love, Ivan Anchors, PA-C  glipiZIDE (GLUCOTROL) 5 MG tablet Take 5 mg by mouth every morning. 01/11/20   [provider]  HYDROcodone-acetaminophen (NORCO/VICODIN) 5-325 MG tablet Take 0.5-1 tablets by mouth every 6 (six) hours as needed. 01/11/20   [provider]   lisinopril (ZESTRIL) 5 MG tablet Take 1 tablet (5 mg total) by mouth daily. 03/30/20   Jettie Booze, MD  Menthol-Methyl Salicylate (MUSCLE RUB) 10-15 % CREA Apply 1 application topically 2 (two) times daily as needed for muscle pain. To lower back 01/07/20   Love, Ivan Anchors, PA-C  metFORMIN (GLUCOPHAGE) 1000 MG tablet Take 1,000 mg by mouth 2 (two) times daily. 02/29/20   [provider]  Multiple Vitamin (MULTIVITAMIN WITH MINERALS) TABS tablet Take 1 tablet by mouth daily. 01/07/20   Love, Ivan Anchors, PA-C  senna-docusate (SENOKOT-S) 8.6-50 MG tablet Take 2 tablets by mouth 2 (two) times daily. Purchase over the counter 01/07/20   Love, Ivan Anchors, PA-C  topiramate (TOPAMAX) 25 MG tablet Take 1 tablet (25 mg total) by mouth 2 (two) times daily. 01/20/20   Kirsteins, Luanna Salk, MD  triamterene-hydrochlorothiazide (MAXZIDE-25) 37.5-25 MG tablet Take 1 tablet by mouth daily. 05/18/21   [provider]    Allergies    Patient has no known allergies.  Review of Systems   Review of Systems  Unable to perform ROS: Mental status change   Physical Exam Updated Vital Signs BP 91/61   Pulse (!) 126   Resp (!) 22   SpO2 99%   Physical Exam Vitals and nursing note reviewed. Exam conducted with a chaperone present (Male RN [place] present as chaperone).  Constitutional:      General: He is not in acute distress.    Appearance: He is ill-appearing. He is not toxic-appearing or diaphoretic.  HENT:     Head: Normocephalic and atraumatic.  Eyes:     General: No scleral icterus.       Right eye: No discharge.        Left eye: No discharge.     Conjunctiva/sclera: Conjunctivae normal.     Pupils: Pupils are equal, round, and reactive to light.  Cardiovascular:     Rate and Rhythm: Tachycardia present.  Pulmonary:     Effort: Pulmonary effort is normal. No tachypnea, bradypnea or respiratory distress.     Breath sounds: Normal breath sounds. No stridor.  Abdominal:      General: There is no distension. There are no signs of injury.     Palpations: Abdomen is soft. There is no mass or pulsatile mass.     Tenderness: There is no abdominal tenderness. There is no guarding or rebound.     Hernia: There is no hernia in the umbilical area or ventral area.  Genitourinary:    Rectum: No mass, tenderness, anal fissure, external hemorrhoid or internal hemorrhoid. Normal anal tone.     Comments: Light brown stool noted in rectal vault.  No melena or frank red blood. Musculoskeletal:     Cervical back: Neck supple. No edema, erythema, signs of trauma, rigidity,  torticollis or crepitus. No pain with movement, spinous process tenderness or muscular tenderness. Normal range of motion.     Comments: No midline tenderness or deformity to cervical, thoracic, or lumbar spine.  Skin:    General: Skin is warm and dry.  Neurological:     General: No focal deficit present.     Mental Status: He is alert.     GCS: GCS eye subscore is 4. GCS verbal subscore is 4. GCS motor subscore is 5.     Comments: No facial asymmetry.  Patient moving all limbs  Psychiatric:        Behavior: Behavior is cooperative.    ED Results / Procedures / Treatments   Labs (all labs ordered are listed, but only abnormal results are displayed) Labs Reviewed  LACTIC ACID, PLASMA - Abnormal; Notable for the following components:      Result Value   Lactic Acid, Venous 6.8 (*)    All other components within normal limits  COMPREHENSIVE METABOLIC PANEL - Abnormal; Notable for the following components:   Sodium 132 (*)    Potassium 6.2 (*)    CO2 14 (*)    Glucose, Bld 250 (*)    BUN 65 (*)    Creatinine, Ser 3.54 (*)    Calcium 8.6 (*)    Total Protein 5.8 (*)    Albumin 2.8 (*)    GFR, Estimated 17 (*)    Anion gap 16 (*)    All other components within normal limits  CBC WITH DIFFERENTIAL/PLATELET - Abnormal; Notable for the following components:   RBC 2.00 (*)    Hemoglobin 6.4 (*)    HCT  19.8 (*)    RDW 17.2 (*)    nRBC 0.4 (*)    All other components within normal limits  PROTIME-INR - Abnormal; Notable for the following components:   Prothrombin Time 15.7 (*)    INR 1.3 (*)    All other components within normal limits  URINALYSIS, ROUTINE W REFLEX MICROSCOPIC - Abnormal; Notable for the following components:   Color, Urine AMBER (*)    APPearance HAZY (*)    All other components within normal limits  POC OCCULT BLOOD, ED - Abnormal; Notable for the following components:   Fecal Occult Bld POSITIVE (*)    All other components within normal limits  CULTURE, BLOOD (ROUTINE X 2)  CULTURE, BLOOD (ROUTINE X 2)  URINE CULTURE  RESP PANEL BY RT-PCR (FLU A&B, COVID) ARPGX2  APTT  LACTIC ACID, PLASMA  BASIC METABOLIC PANEL  TYPE AND SCREEN  PREPARE RBC (CROSSMATCH)  ABO/RH    EKG EKG Interpretation  Date/Time:  Sunday October 28 2021 17:59:20 EST Ventricular Rate:  127 PR Interval:    QRS Duration: 132 QT Interval:  369 QTC Calculation: 537 R Axis:   70 Text Interpretation: Atrial fibrillation Left bundle branch block Artifact in lead(s) I II aVR Confirmed by Pattricia Boss [phone]) on 10/28/2021 6:16:57 PM  Radiology CT HEAD WO CONTRAST (5MM)  Result Date: 10/28/2021 CLINICAL DATA:  Patient states he cannot: Self up since yesterday. EXAM: CT HEAD WITHOUT CONTRAST CT CERVICAL SPINE WITHOUT CONTRAST TECHNIQUE: Multidetector CT imaging of the head and cervical spine was performed following the standard protocol without intravenous contrast. Multiplanar CT image reconstructions of the cervical spine were also generated. COMPARISON:  CT scan of the brain December 19, 2019. FINDINGS: CT HEAD FINDINGS Brain: Chronic white matter changes are stable. No evidence of acute infarction, hemorrhage, hydrocephalus, extra-axial collection or  mass lesion/mass effect. Vascular: Calcified atherosclerosis in the intracranial carotids. Skull: Normal. Negative for fracture or focal  lesion. Sinuses/Orbits: No acute finding. Other: None. CT CERVICAL SPINE FINDINGS Alignment: Normal. Skull base and vertebrae: No acute fracture. No primary bone lesion or focal pathologic process. Soft tissues and spinal canal: No prevertebral fluid or swelling. No visible canal hematoma. Disc levels:  Mild multilevel degenerative disc disease. Upper chest: Negative. Other: No other abnormalities. IMPRESSION: 1. No acute intracranial abnormalities. 2. No fracture or traumatic malalignment in the cervical spine. Electronically Signed   By: Dorise Bullion III M.D.   On: 10/28/2021 19:49   CT Cervical Spine Wo Contrast  Result Date: 10/28/2021 CLINICAL DATA:  Patient states he cannot: Self up since yesterday. EXAM: CT HEAD WITHOUT CONTRAST CT CERVICAL SPINE WITHOUT CONTRAST TECHNIQUE: Multidetector CT imaging of the head and cervical spine was performed following the standard protocol without intravenous contrast. Multiplanar CT image reconstructions of the cervical spine were also generated. COMPARISON:  CT scan of the brain December 19, 2019. FINDINGS: CT HEAD FINDINGS Brain: Chronic white matter changes are stable. No evidence of acute infarction, hemorrhage, hydrocephalus, extra-axial collection or mass lesion/mass effect. Vascular: Calcified atherosclerosis in the intracranial carotids. Skull: Normal. Negative for fracture or focal lesion. Sinuses/Orbits: No acute finding. Other: None. CT CERVICAL SPINE FINDINGS Alignment: Normal. Skull base and vertebrae: No acute fracture. No primary bone lesion or focal pathologic process. Soft tissues and spinal canal: No prevertebral fluid or swelling. No visible canal hematoma. Disc levels:  Mild multilevel degenerative disc disease. Upper chest: Negative. Other: No other abnormalities. IMPRESSION: 1. No acute intracranial abnormalities. 2. No fracture or traumatic malalignment in the cervical spine. Electronically Signed   By: Dorise Bullion III M.D.   On: 10/28/2021  19:49   DG Chest Port 1 View  Result Date: 10/28/2021 CLINICAL DATA:  Sepsis EXAM: PORTABLE CHEST 1 VIEW COMPARISON:  04/25/2016 FINDINGS: The lungs are symmetrically expanded. No focal pulmonary infiltrate identified. There is a nodular opacity within the right suprahilar region which may represent vascular shadow or a focal pulmonary nodule, but is not we well characterized ll on this examination. This does, however, appear more prominent than on prior examination. No pneumothorax or pleural effusion. Cardiac size is within normal limits. No acute bone abnormality. IMPRESSION: No radiographic evidence of acute cardiopulmonary disease. Nodular opacity within the right suprahilar region, indeterminate. Standard two view chest radiograph may be helpful in excluding and artifactual opacity. Electronically Signed   By: Fidela Salisbury M.D.   On: 10/28/2021 19:07    Procedures .Critical Care Performed by: Loni Beckwith, PA-C Authorized by: Loni Beckwith, PA-C   Critical care provider statement:    Critical care time (minutes):  45   Critical care was necessary to treat or prevent imminent or life-threatening deterioration of the following conditions:  Renal failure, circulatory failure and sepsis   Critical care was time spent personally by me on the following activities:  Development of treatment plan with patient or surrogate, discussions with consultants, evaluation of patient's response to treatment, examination of patient, ordering and review of laboratory studies, ordering and review of radiographic studies, ordering and performing treatments and interventions, pulse oximetry, re-evaluation of patient's condition, review of old charts and obtaining history from patient or surrogate   Care discussed with: admitting provider     Medications Ordered in ED Medications  sodium zirconium cyclosilicate (LOKELMA) packet 10 g (10 g Oral Not Given 10/28/21 1944)  0.9 %  sodium chloride  infusion (0 mL/hr Intravenous Hold 10/28/21 1945)  ceFEPIme (MAXIPIME) 2 g in sodium chloride 0.9 % 100 mL IVPB (has no administration in time range)  metroNIDAZOLE (FLAGYL) IVPB 500 mg (has no administration in time range)  vancomycin (VANCOREADY) IVPB 1500 mg/300 mL (has no administration in time range)  acetaminophen (TYLENOL) suppository 650 mg (has no administration in time range)  lactated ringers bolus 1,000 mL (0 mLs Intravenous Stopped 10/28/21 1930)  insulin aspart (novoLOG) injection 5 Units (5 Units Intravenous Given 10/28/21 1930)  calcium gluconate inj 10% (1 g) URGENT USE ONLY! (1 g Intravenous Given 10/28/21 1931)  sodium bicarbonate injection 50 mEq (50 mEq Intravenous Given 10/28/21 1941)  lactated ringers bolus 1,000 mL (1,000 mLs Intravenous New Bag/Given 10/28/21 1943)    ED Course  I have reviewed the triage vital signs and the nursing notes.  Pertinent labs & imaging results that were available during my care of the patient were reviewed by me and considered in my medical decision making (see chart for details).  Clinical Course as of 10/28/21 2014  Nancy Fetter Oct 28, 2021  2011 Spoke to hospitalist Dr.Tu who will see the patient for admission. [PB]    Clinical Course User Index [PB] Loni Beckwith, PA-C   MDM Rules/Calculators/A&P                           Alert and-year-old male in no acute distress, nontoxic appearing.  Patient does appear ill.  Brought to emergency department by his son who is at bedside for reports of altered mental status and low hemoglobin.  Patient is unable to answer questions, level 5 caveat applies.  Patient son reports that father has had altered mental status of the last 2 days.  States that he may have suffered a fall earlier today.  Was brought to urgent care where lab testing was done, and was told that patient's hemoglobin was low.  Patient noted to be tachycardic with rate in the 120s.  EKG shows atrial fibrillation.  Per chart  review patient has no prior history of A. fib.  We will hold any anticoagulation at this time due to reports of low hemoglobin.  Rectal temperature 99.1 F.  Concern for possible sepsis infection.  ED evolving sepsis initiated.  Due to reports of possible fall will obtain noncontrast head and cervical spine CT.  Patient noted to be hypotensive, LR fluid bolus initiated.  Patient given Tylenol.  Lab work shows hemoglobin of 6.4.  Hemoccult positive however no melena or frank red blood on exam.  Spoke to patient's son at bedside who gives consent for blood transfusion.  We will transfuse 2 units.  Lab work shows increased creatinine and BUN.  We will continue IV hydration with lactated Ringer's.  Potassium elevated at 6.2 we will give patient insulin, Lokelma, bicarb, and calcium gluconate.  We will recheck BMP  Urinalysis shows no signs of infection or hematuria. Chest x-ray shows no active cardiopulmonary disease. Lactic acid elevated at 6.8, due to concern for sepsis will initiate broad-spectrum antibiotics.  30 mL/kg fluid bolus was not ordered as patient is already receiving this amount of fluid.    Will consult hospitalist team for admission.  Patient was discussed with and evaluated by Dr. Jeanell Sparrow.  Spoke to hospitalist Dr. Flossie Buffy who will see the patient for admission.     Final Clinical Impression(s) / ED Diagnoses Final diagnoses:  None    Rx /  DC Orders ED Discharge Orders     None        Dyann Ruddle 10/28/21 2015    Pattricia Boss, MD 10/29/21 1136

## 2021-10-28 NOTE — Progress Notes (Addendum)
Pharmacy Antibiotic Note  [facility] is a 80 y.o. male admitted on 10/28/2021 with sepsis.  Pharmacy has been consulted for cefepime and vancomycin dosing.  Patient with a history of diabetes mellitus type 2, hypertension, CVA 2021, hyperlipidemia. Patient presenting with AMS.  Patient's serum creatinine is 3.54 which is significantly above baseline. WBC 7.1; LA 6.8; afebrile  Plan: Cefepime 2g q24h Vancomycin 1500 mg once, subsequent dosing as indicated per random vancomycin level until renal function stable and/or improved, at which time scheduled dosing can be considered Trend WBC, fever, renal function, and clinical course Follow-up cultures and de-escalate antibiotics as appropriate.     Temp (24hrs), Avg:99.1 F (37.3 C), Min:99.1 F (37.3 C), Max:99.1 F (37.3 C)  Recent Labs  Lab 10/28/21 1808  WBC 7.1  CREATININE 3.54*  LATICACIDVEN 6.8*    CrCl cannot be calculated (Unknown ideal weight.).    No Known Allergies  Antimicrobials this admission: cefepime 11/20 >>  vancomycin 11/20 >>   Microbiology results: Pending  Thank you for allowing pharmacy to be a part of this patient's care.  Lorelei Pont, PharmD, BCPS 10/28/2021 8:05 PM ED Clinical Pharmacist -  [phone]

## 2021-10-28 NOTE — H&P (Addendum)
History and Physical    Eugene Cooper EPP:[id number] DOB: Jan 25, 1941 DOA: 10/28/2021  PCP: Emelia Loron, NP  Patient coming from: Home  I have personally briefly reviewed patient's old medical records in [place]  Chief Complaint: AMS and labored respiration  HPI: Eugene Cooper is a 80 y.o. male with medical history significant for CVA, LBBB, type 2 diabetes and hyperlipidemia who presents with concerns of altered mental status and labored respiration.  Son who lives with patient is at bedside and provides the history.  Virtual Spanish interpreter was used.  For the past 2 days patient has decreased appetite.  Has not really been eating or drinking.  He normally is able to ask for help to use the bathroom but urinated on himself today.  Son has also noticed some increased labored respiration but denies any coughing or fever.  No nausea, vomiting or diarrhea.  Denies any sick contact.  Only thing is patient has been complaining of bilateral knee pain and ankle pain.  He was so weak that family has been transferring him by wheelchair for the past 2 days.  Normally is able to ambulate with a walker.  States about a month ago he did get steroid injections in his knee which helped but now pain is worse again.  ED Course: He had a temperature of 90 9.37F, tachycardic with rates up to 120s and was found to be in atrial fibrillation, he was tachypneic but stable on room air.   BP of 90 over 60s.  Hemoglobin of 6.4 with platelets pending.  Lactate of 6.8.  FOBT positive. Sodium of 132, K of 6.2, chloride of 102, bicarb of 14, creatinine of 3.54/BUN 65, BG of 250.  Anion gap of 16.  Normal LFTs. In and out cath UA was negative. Chest x-ray had nodular opacity in the right suprahilar region that is indeterminant but otherwise no acute cardiopulmonary process. Sinus CT head and cervical spine negative for acute findings.  Review of Systems: Unable to obtain due to pt AMS  Past  Medical History:  Diagnosis Date   Diabetes ([place])    Hyperlipemia    Hypertension    Stroke [facility])     History reviewed. No pertinent surgical history.   reports that he has never smoked. He has never used smokeless tobacco. He reports that he does not currently use alcohol. He reports that he does not use drugs. Social History  No Known Allergies  Family History  Problem Relation Age of Onset   Stroke Mother    Lung disease Father    Diabetes Sister    Heart disease Sister    Hyperlipidemia Son    Hyperlipidemia Daughter    Hypertension Daughter      Prior to Admission medications   Medication Sig Start Date End Date Taking? Authorizing Provider  Accu-Chek Softclix Lancets lancets USE UPTO FOUR TIMES DAILY AS DIRECTED 01/08/20   [provider]  acetaminophen (TYLENOL) 325 MG tablet Take 1-2 tablets (325-650 mg total) by mouth every 4 (four) hours as needed for mild pain. 01/07/20   Love, Ivan Anchors, PA-C  allopurinol (ZYLOPRIM) 300 MG tablet Take 1 tablet (300 mg total) by mouth daily. 06/04/21   Collier Salina, MD  aspirin 81 MG EC tablet Take 1 tablet (81 mg total) by mouth daily. 03/30/20   Jettie Booze, MD  atorvastatin (LIPITOR) 40 MG tablet Take 1 tablet (40 mg total) by mouth daily at 6 PM. 01/07/20   Love, Olin Hauser  S, PA-C  blood glucose meter kit and supplies KIT Dispense based on patient and insurance preference. Use up to four times daily as directed. (FOR ICD-9 250.00, 250.01). 01/07/20   Love, Ivan Anchors, PA-C  gabapentin (NEURONTIN) 100 MG capsule Take 1 capsule (100 mg total) by mouth 3 (three) times daily. 01/07/20   Love, Ivan Anchors, PA-C  glipiZIDE (GLUCOTROL) 5 MG tablet Take 5 mg by mouth every morning. 01/11/20   [provider]  HYDROcodone-acetaminophen (NORCO/VICODIN) 5-325 MG tablet Take 0.5-1 tablets by mouth every 6 (six) hours as needed. 01/11/20   [provider]  lisinopril (ZESTRIL) 5 MG tablet Take 1 tablet (5 mg total) by  mouth daily. 03/30/20   Jettie Booze, MD  Menthol-Methyl Salicylate (MUSCLE RUB) 10-15 % CREA Apply 1 application topically 2 (two) times daily as needed for muscle pain. To lower back 01/07/20   Love, Ivan Anchors, PA-C  metFORMIN (GLUCOPHAGE) 1000 MG tablet Take 1,000 mg by mouth 2 (two) times daily. 02/29/20   [provider]  Multiple Vitamin (MULTIVITAMIN WITH MINERALS) TABS tablet Take 1 tablet by mouth daily. 01/07/20   Love, Ivan Anchors, PA-C  senna-docusate (SENOKOT-S) 8.6-50 MG tablet Take 2 tablets by mouth 2 (two) times daily. Purchase over the counter 01/07/20   Love, Ivan Anchors, PA-C  topiramate (TOPAMAX) 25 MG tablet Take 1 tablet (25 mg total) by mouth 2 (two) times daily. 01/20/20   Kirsteins, Luanna Salk, MD  triamterene-hydrochlorothiazide (MAXZIDE-25) 37.5-25 MG tablet Take 1 tablet by mouth daily. 05/18/21   [provider]    Physical Exam: Vitals:   10/28/21 1945 10/28/21 2000 10/28/21 2015 10/28/21 2030  BP: 107/61 109/76 105/61 121/87  Pulse: (!) 121 (!) 118 (!) 119 (!) 118  Resp: 20 (!) 21 19 17   Temp:      TempSrc:      SpO2: 100% 100% 99% 100%    Constitutional: NAD, nontoxic but confused appearing male laying flat in bed.  Frequently wants to climb out of bed to use the bathroom and needed redirection from son at bedside. Vitals:   10/28/21 1945 10/28/21 2000 10/28/21 2015 10/28/21 2030  BP: 107/61 109/76 105/61 121/87  Pulse: (!) 121 (!) 118 (!) 119 (!) 118  Resp: 20 (!) 21 19 17   Temp:      TempSrc:      SpO2: 100% 100% 99% 100%   Eyes: PERRL, lids and conjunctivae normal ENMT: Mucous membranes are moist.  Neck: normal, supple,  Respiratory: clear to auscultation bilaterally, no wheezing, no crackles. Normal respiratory effort.  Cardiovascular: Regular rate and rhythm, no murmurs / rubs / gallops.  Mild left lateral malleolus edema.   Abdomen: Mild guarding to the epigastric and right upper quadrant but no grimacing or discomfort with exam.   Bowel sounds positive.  Musculoskeletal: no clubbing / cyanosis. No joint deformity upper and lower extremities. Good ROM, no contractures. Normal muscle tone.  Skin: no rashes, lesions, ulcers. No induration Neurologic: Alert but disoriented.  Able to move all extremities and frequently tries to climb out of bed. Psychiatric: Disoriented   Labs on Admission: I have personally reviewed following labs and imaging studies  CBC: Recent Labs  Lab 10/28/21 1808  WBC 7.1  NEUTROABS 3.9  HGB 6.4*  HCT 19.8*  MCV 99.0  PLT 025*   Basic Metabolic Panel: Recent Labs  Lab 10/28/21 1808  NA 132*  K 6.2*  CL 102  CO2 14*  GLUCOSE 250*  BUN 65*  CREATININE 3.54*  CALCIUM 8.6*   GFR: CrCl cannot be calculated (Unknown ideal weight.). Liver Function Tests: Recent Labs  Lab 10/28/21 1808  AST 26  ALT 23  ALKPHOS 78  BILITOT 1.0  PROT 5.8*  ALBUMIN 2.8*   No results for input(s): LIPASE, AMYLASE in the last 168 hours. No results for input(s): AMMONIA in the last 168 hours. Coagulation Profile: Recent Labs  Lab 10/28/21 1808  INR 1.3*   Cardiac Enzymes: No results for input(s): CKTOTAL, CKMB, CKMBINDEX, TROPONINI in the last 168 hours. BNP (last 3 results) No results for input(s): PROBNP in the last 8760 hours. HbA1C: No results for input(s): HGBA1C in the last 72 hours. CBG: No results for input(s): GLUCAP in the last 168 hours. Lipid Profile: No results for input(s): CHOL, HDL, LDLCALC, TRIG, CHOLHDL, LDLDIRECT in the last 72 hours. Thyroid Function Tests: No results for input(s): TSH, T4TOTAL, FREET4, T3FREE, THYROIDAB in the last 72 hours. Anemia Panel: No results for input(s): VITAMINB12, FOLATE, FERRITIN, TIBC, IRON, RETICCTPCT in the last 72 hours. Urine analysis:    Component Value Date/Time   COLORURINE AMBER (A) 10/28/2021 1808   APPEARANCEUR HAZY (A) 10/28/2021 1808   LABSPEC 1.015 10/28/2021 1808   PHURINE 5.0 10/28/2021 1808   GLUCOSEU NEGATIVE  10/28/2021 1808   HGBUR NEGATIVE 10/28/2021 1808   BILIRUBINUR NEGATIVE 10/28/2021 1808   KETONESUR NEGATIVE 10/28/2021 1808   PROTEINUR NEGATIVE 10/28/2021 1808   NITRITE NEGATIVE 10/28/2021 1808   LEUKOCYTESUR NEGATIVE 10/28/2021 1808    Radiological Exams on Admission: CT HEAD WO CONTRAST (5MM)  Result Date: 10/28/2021 CLINICAL DATA:  Patient states he cannot: Self up since yesterday. EXAM: CT HEAD WITHOUT CONTRAST CT CERVICAL SPINE WITHOUT CONTRAST TECHNIQUE: Multidetector CT imaging of the head and cervical spine was performed following the standard protocol without intravenous contrast. Multiplanar CT image reconstructions of the cervical spine were also generated. COMPARISON:  CT scan of the brain December 19, 2019. FINDINGS: CT HEAD FINDINGS Brain: Chronic white matter changes are stable. No evidence of acute infarction, hemorrhage, hydrocephalus, extra-axial collection or mass lesion/mass effect. Vascular: Calcified atherosclerosis in the intracranial carotids. Skull: Normal. Negative for fracture or focal lesion. Sinuses/Orbits: No acute finding. Other: None. CT CERVICAL SPINE FINDINGS Alignment: Normal. Skull base and vertebrae: No acute fracture. No primary bone lesion or focal pathologic process. Soft tissues and spinal canal: No prevertebral fluid or swelling. No visible canal hematoma. Disc levels:  Mild multilevel degenerative disc disease. Upper chest: Negative. Other: No other abnormalities. IMPRESSION: 1. No acute intracranial abnormalities. 2. No fracture or traumatic malalignment in the cervical spine. Electronically Signed   By: Dorise Bullion III M.D.   On: 10/28/2021 19:49   CT Cervical Spine Wo Contrast  Result Date: 10/28/2021 CLINICAL DATA:  Patient states he cannot: Self up since yesterday. EXAM: CT HEAD WITHOUT CONTRAST CT CERVICAL SPINE WITHOUT CONTRAST TECHNIQUE: Multidetector CT imaging of the head and cervical spine was performed following the standard protocol  without intravenous contrast. Multiplanar CT image reconstructions of the cervical spine were also generated. COMPARISON:  CT scan of the brain December 19, 2019. FINDINGS: CT HEAD FINDINGS Brain: Chronic white matter changes are stable. No evidence of acute infarction, hemorrhage, hydrocephalus, extra-axial collection or mass lesion/mass effect. Vascular: Calcified atherosclerosis in the intracranial carotids. Skull: Normal. Negative for fracture or focal lesion. Sinuses/Orbits: No acute finding. Other: None. CT CERVICAL SPINE FINDINGS Alignment: Normal. Skull base and vertebrae: No acute fracture. No primary bone lesion or focal pathologic process. Soft  tissues and spinal canal: No prevertebral fluid or swelling. No visible canal hematoma. Disc levels:  Mild multilevel degenerative disc disease. Upper chest: Negative. Other: No other abnormalities. IMPRESSION: 1. No acute intracranial abnormalities. 2. No fracture or traumatic malalignment in the cervical spine. Electronically Signed   By: Dorise Bullion III M.D.   On: 10/28/2021 19:49   DG Chest Port 1 View  Result Date: 10/28/2021 CLINICAL DATA:  Sepsis EXAM: PORTABLE CHEST 1 VIEW COMPARISON:  04/25/2016 FINDINGS: The lungs are symmetrically expanded. No focal pulmonary infiltrate identified. There is a nodular opacity within the right suprahilar region which may represent vascular shadow or a focal pulmonary nodule, but is not we well characterized ll on this examination. This does, however, appear more prominent than on prior examination. No pneumothorax or pleural effusion. Cardiac size is within normal limits. No acute bone abnormality. IMPRESSION: No radiographic evidence of acute cardiopulmonary disease. Nodular opacity within the right suprahilar region, indeterminate. Standard two view chest radiograph may be helpful in excluding and artifactual opacity. Electronically Signed   By: Fidela Salisbury M.D.   On: 10/28/2021 19:07       Assessment/Plan  SIRS criteria -Patient presented with tachycardia, tachypnea and lactate of 6.8.  He was initiated on sepsis protocol in the ED and started on broad-spectrum antibiotics with IV vancomycin, cefepime and Flagyl.  However only symptom per son has been altered mental status and labored respiration which could be attributed to his new onset anemia and atrial fibrillation with RVR. -Chest x-ray, UA, flu/COVID PCR test negative.  Has benign abdominal exam. -will obtain procalcitonin to aid in determining whether he has a source of infection  Hypotension Secondary to sepsis, acute anemia and new onset atrial fibrillation -Improving with aggressive fluid management -continue to monitor closely  Acute blood loss anemia -Hemoglobin of 6.4 from 14 a yr ago. FOBT positive but no bright red blood -Transfuse 2 U pRBC and following serial H/H -Transfusion threshold of hemoglobin less than 7 -needs GI consult   New onset Atrial fibrillation with RVR  -Rates remaining around 120.  Suspect this will improve with fluids and blood transfusion. Will hold on beta-blocker until his BP improves. -CHA2DS2-VASc score of 5 but unable to start anticoagulation due to acute anemia  -obtain echo tomorrow  AKI  -pre-renal from decrease intake -Creatinine of 3.54 with a prior normal 1 year ago -Continue aggressive IV fluid management -Avoid nephrotoxic agent, contrast. Need to renally dose medication  Hyperkalemia -due to worsening renal function  -K of 6.4. Has received IV calcium gluconate, insulin, bicarb and Lokelma in the ED -Follow with repeat BMP  Anion gap Metabolic acidosis  -Secondary to acute kidney injury -Chloride of 102, CO2 14, anion gap of 16. Lactate of 6.8.  -Continue to follow with aggressive IV fluid and blood transfusion  Acute metabolic encephalopathy Multifactorial from new anemia, new onset atrial fibrillation, possible uremia from worsening renal  function/metabolic acidosis -tx as above and place on fall precaution   Type 2 diabetes with hyperglycemia - BG initially around 200.  Normalized following aggressive IV fluid -Continue to monitor for now and hold any sliding scale insulin overnight to avoid hypoglycemia  Hx of CVA -unclear why pt has not been on aspirin. Needs to resume once anemia issue resolves   DVT prophylaxis:SCDs Code Status: Full Family Communication: Plan discussed with son at bedside  disposition Plan: Home with at least 2 midnight stays  Consults called:  Admission status: inpatient  Level of care:  Progressive  Status is: Inpatient  Remains inpatient appropriate because: Acute blood loss anemia, new onset atrial fibrillation causing hemodynamic instability requiring close monitoring.         Orene Desanctis DO [facility]   If 7PM-7AM, please contact night-coverage [email or web address]   10/28/2021, 8:54 PM

## 2021-10-28 NOTE — Sepsis Progress Note (Signed)
[facility] following Code Sepsis. 

## 2021-10-28 NOTE — ED Notes (Signed)
Patient transported to CT 

## 2021-10-29 ENCOUNTER — Inpatient Hospital Stay (HOSPITAL_COMMUNITY): Payer: Medicare Other

## 2021-10-29 ENCOUNTER — Inpatient Hospital Stay (HOSPITAL_COMMUNITY): Payer: Medicare Other | Admitting: Anesthesiology

## 2021-10-29 ENCOUNTER — Encounter (HOSPITAL_COMMUNITY): Payer: Self-pay | Admitting: Family Medicine

## 2021-10-29 ENCOUNTER — Encounter (HOSPITAL_COMMUNITY)
Admission: EM | Disposition: A | Discharge status: Home Health | Payer: Self-pay | Source: Home / Self Care | Attending: Student

## 2021-10-29 DIAGNOSIS — D696 Thrombocytopenia, unspecified: Secondary | ICD-10-CM | POA: Diagnosis not present

## 2021-10-29 DIAGNOSIS — G9341 Metabolic encephalopathy: Secondary | ICD-10-CM | POA: Diagnosis not present

## 2021-10-29 DIAGNOSIS — Z20822 Contact with and (suspected) exposure to covid-19: Secondary | ICD-10-CM | POA: Diagnosis not present

## 2021-10-29 DIAGNOSIS — K259 Gastric ulcer, unspecified as acute or chronic, without hemorrhage or perforation: Secondary | ICD-10-CM

## 2021-10-29 DIAGNOSIS — I4891 Unspecified atrial fibrillation: Secondary | ICD-10-CM

## 2021-10-29 DIAGNOSIS — D62 Acute posthemorrhagic anemia: Secondary | ICD-10-CM | POA: Diagnosis not present

## 2021-10-29 DIAGNOSIS — R4182 Altered mental status, unspecified: Secondary | ICD-10-CM | POA: Diagnosis not present

## 2021-10-29 DIAGNOSIS — A419 Sepsis, unspecified organism: Secondary | ICD-10-CM | POA: Diagnosis not present

## 2021-10-29 DIAGNOSIS — J189 Pneumonia, unspecified organism: Secondary | ICD-10-CM | POA: Diagnosis not present

## 2021-10-29 HISTORY — PX: ESOPHAGOGASTRODUODENOSCOPY (EGD) WITH PROPOFOL: SHX5813

## 2021-10-29 HISTORY — PX: BIOPSY: SHX5522

## 2021-10-29 LAB — GLUCOSE, CAPILLARY
Glucose-Capillary: 110 mg/dL — ABNORMAL HIGH (ref 70–99)
Glucose-Capillary: 138 mg/dL — ABNORMAL HIGH (ref 70–99)

## 2021-10-29 LAB — BASIC METABOLIC PANEL
Anion gap: 8 (ref 5–15)
BUN: 51 mg/dL — ABNORMAL HIGH (ref 8–23)
CO2: 22 mmol/L (ref 22–32)
Calcium: 8.6 mg/dL — ABNORMAL LOW (ref 8.9–10.3)
Chloride: 107 mmol/L (ref 98–111)
Creatinine, Ser: 2.05 mg/dL — ABNORMAL HIGH (ref 0.61–1.24)
GFR, Estimated: 32 mL/min — ABNORMAL LOW (ref >60)
Glucose, Bld: 79 mg/dL (ref 70–99)
Potassium: 5.1 mmol/L (ref 3.5–5.1)
Sodium: 137 mmol/L (ref 135–145)

## 2021-10-29 LAB — ECHOCARDIOGRAM COMPLETE
AR max vel: 2.23 cm2
AV Peak grad: 7.6 mmHg
Ao pk vel: 1.38 m/s
Area-P 1/2: 7.66 cm2
Calc EF: 52.1 %
S' Lateral: 3.8 cm
Single Plane A2C EF: 53.3 %
Single Plane A4C EF: 53.9 %

## 2021-10-29 LAB — CBC
HCT: 24.8 % — ABNORMAL LOW (ref 39.0–52.0)
HCT: 25.7 % — ABNORMAL LOW (ref 39.0–52.0)
Hemoglobin: 8.5 g/dL — ABNORMAL LOW (ref 13.0–17.0)
Hemoglobin: 8.6 g/dL — ABNORMAL LOW (ref 13.0–17.0)
MCH: 30 pg (ref 26.0–34.0)
MCH: 29.8 pg (ref 26.0–34.0)
MCHC: 34.3 g/dL (ref 30.0–36.0)
MCHC: 33.5 g/dL (ref 30.0–36.0)
MCV: 87.6 fL (ref 80.0–100.0)
MCV: 88.9 fL (ref 80.0–100.0)
Platelets: 69 10*3/uL — ABNORMAL LOW (ref 150–400)
Platelets: 73 10*3/uL — ABNORMAL LOW (ref 150–400)
RBC: 2.83 MIL/uL — ABNORMAL LOW (ref 4.22–5.81)
RBC: 2.89 MIL/uL — ABNORMAL LOW (ref 4.22–5.81)
RDW: 16.4 % — ABNORMAL HIGH (ref 11.5–15.5)
RDW: 16.9 % — ABNORMAL HIGH (ref 11.5–15.5)
WBC: 6.8 10*3/uL (ref 4.0–10.5)
WBC: 9.1 10*3/uL (ref 4.0–10.5)
nRBC: 0.4 % — ABNORMAL HIGH (ref 0.0–0.2)
nRBC: 0.2 % (ref 0.0–0.2)

## 2021-10-29 LAB — PROCALCITONIN: Procalcitonin: 0.59 ng/mL

## 2021-10-29 LAB — HEMOGLOBIN A1C
Hgb A1c MFr Bld: 7.5 % — ABNORMAL HIGH (ref 4.8–5.6)
Mean Plasma Glucose: 168.55 mg/dL

## 2021-10-29 LAB — MRSA NEXT GEN BY PCR, NASAL: MRSA by PCR Next Gen: NOT DETECTED

## 2021-10-29 LAB — PATHOLOGIST SMEAR REVIEW

## 2021-10-29 LAB — URINE CULTURE: Culture: NO GROWTH

## 2021-10-29 LAB — URIC ACID: Uric Acid, Serum: 5 mg/dL (ref 3.7–8.6)

## 2021-10-29 SURGERY — ESOPHAGOGASTRODUODENOSCOPY (EGD) WITH PROPOFOL
Anesthesia: Monitor Anesthesia Care

## 2021-10-29 MED ORDER — DEXTROSE IN LACTATED RINGERS 5 % IV SOLN
INTRAVENOUS | Status: DC
Start: 2021-10-29 — End: 2021-10-31

## 2021-10-29 MED ORDER — ACETAMINOPHEN 325 MG PO TABS
650.0000 mg | ORAL_TABLET | Freq: Four times a day (QID) | ORAL | Status: DC | PRN
  Administered 2021-10-29 – 2021-11-07 (×9): 650 mg via ORAL
  Filled 2021-10-29 (×9): qty 2

## 2021-10-29 MED ORDER — LACTATED RINGERS IV SOLN
INTRAVENOUS | Status: AC | PRN
  Administered 2021-10-29: 1000 mL via INTRAVENOUS

## 2021-10-29 MED ORDER — ALLOPURINOL 300 MG PO TABS
150.0000 mg | ORAL_TABLET | Freq: Every day | ORAL | Status: DC
  Administered 2021-10-29 – 2021-11-10 (×13): 150 mg via ORAL
  Filled 2021-10-29 (×13): qty 1

## 2021-10-29 MED ORDER — COLCHICINE 0.3 MG HALF TABLET
0.3000 mg | ORAL_TABLET | Freq: Every day | ORAL | Status: DC
  Administered 2021-10-30 – 2021-11-01 (×3): 0.3 mg via ORAL
  Filled 2021-10-29 (×5): qty 1

## 2021-10-29 MED ORDER — ACETAMINOPHEN 325 MG PO TABS: 650.0000 mg | ORAL_TABLET | Freq: Four times a day (QID) | ORAL | Status: DC | PRN

## 2021-10-29 MED ORDER — ACETAMINOPHEN 650 MG RE SUPP: 650.0000 mg | Freq: Four times a day (QID) | RECTAL | Status: DC | PRN

## 2021-10-29 MED ORDER — INSULIN ASPART 100 UNIT/ML IJ SOLN
0.0000 [IU] | Freq: Three times a day (TID) | INTRAMUSCULAR | Status: DC
  Administered 2021-10-30 (×2): 1 [IU] via SUBCUTANEOUS
  Administered 2021-10-30: 2 [IU] via SUBCUTANEOUS
  Administered 2021-10-31 – 2021-11-01 (×2): 1 [IU] via SUBCUTANEOUS

## 2021-10-29 MED ORDER — PHENYLEPHRINE HCL-NACL 20-0.9 MG/250ML-% IV SOLN
INTRAVENOUS | Status: DC | PRN
  Administered 2021-10-29: 40 ug/min via INTRAVENOUS

## 2021-10-29 MED ORDER — PHENYLEPHRINE 40 MCG/ML (10ML) SYRINGE FOR IV PUSH (FOR BLOOD PRESSURE SUPPORT)
PREFILLED_SYRINGE | INTRAVENOUS | Status: DC | PRN
  Administered 2021-10-29 (×2): 120 ug via INTRAVENOUS

## 2021-10-29 MED ORDER — LACTATED RINGERS IV SOLN: INTRAVENOUS | Status: DC

## 2021-10-29 MED ORDER — METOPROLOL TARTRATE 5 MG/5ML IV SOLN: 2.5000 mg | Freq: Four times a day (QID) | INTRAVENOUS | Status: DC | PRN

## 2021-10-29 MED ORDER — PROPOFOL 500 MG/50ML IV EMUL
INTRAVENOUS | Status: DC | PRN
Start: 2021-10-29 — End: 2021-10-29
  Administered 2021-10-29: 50 ug/kg/min via INTRAVENOUS

## 2021-10-29 MED ORDER — PANTOPRAZOLE SODIUM 40 MG IV SOLR
40.0000 mg | Freq: Two times a day (BID) | INTRAVENOUS | Status: DC
  Administered 2021-10-29 – 2021-11-10 (×25): 40 mg via INTRAVENOUS
  Filled 2021-10-29 (×26): qty 40

## 2021-10-29 MED ORDER — SODIUM ZIRCONIUM CYCLOSILICATE 10 G PO PACK
10.0000 g | PACK | Freq: Once | ORAL | Status: DC
  Filled 2021-10-29: qty 1

## 2021-10-29 SURGICAL SUPPLY — 14 items

## 2021-10-29 NOTE — Progress Notes (Signed)
PROGRESS NOTE    Eugene Cooper  ZOX:[id number] DOB: 11/28/1941 DOA: 10/28/2021 PCP: Emelia Loron, NP   Brief Narrative: 80 year old with past medical history significant for CVA, LBBB, diabetes type 2, hyperlipidemia who presents with concern for altered mental status and labile respiration.  Over the last 2 days patient has had decreased appetite, not eating or drinking.  Family noticed increased labored breathing.  Patient complaining of bilateral knee pain and ankle pain.    Evaluation in the ED: Patient was noted to be tachycardic heart rate up to the 120 in A. fib, tachypneic, hemoglobin 6.4, lactate of 6.8 FOBT positive.  K of 6.2 creatinine 3.5 anion gap 16.  Chest x-ray nodular opacity in the right suprahilar region that is indeterminate, CT head: Negative for acute finding   Assessment & Plan:   Principal Problem:   Acute blood loss anemia Active Problems:   Controlled type 2 diabetes mellitus with hyperglycemia, without long-term current use of insulin (HCC)   Hypotension   Atrial fibrillation with RVR (HCC)   Hyperkalemia   Metabolic acidosis, increased anion gap (IAG)   Acute metabolic encephalopathy   AKI (acute kidney injury) ([place])   History of stroke  1-SIRS criteria: Patient presented with tachycardia, tachypnea and lactic acid of 6.8. He received IV fluids. Continue with broad-spectrum antibiotics. Plan to repeat chest x-ray tomorrow. COVID PCR negative. Follow  with urine culture and blood culture.  2-Hypotension: Possible secondary to hypovolemia, anemia, new onset A. fib. Received blood transfusion and IV fluids.  3-Acute blood loss anemia: Patient presented with a hemoglobin of 6.4.  Was fourteen 1 year ago.  FOBT positive. He received 2 unit of packed red blood cell.  Hemoglobin increased to 8. Need to monitor hemoglobin. IV Protonix twice daily GI Consulted. Underwent endoscopy: Showed gastric erosions and gastritis. Per GI patient will benefit  from colonoscopy when he is able to tolerate prep.   4-new onset A. fib: Systolic blood pressure soft. Will order IV as needed metoprolol Cardiology has been consulted. ECHO; Ef 55 %  Holding anticoagulation due to low hemoglobin  5-AKI on CKD stage 3a:  In the setting of hypovolemia hypotension. Received IV fluids. Renal function improving. 7 with a creatinine of 3.5. Prior creatinine 1.4  6-Hyperkalemia: Due to AKI. He received calcium gluconate, insulin bicarb and Lokelma. Will repeat another dose of Lokelma  7-Diabetes type 2 with hyperglycemia: Continue with a sliding scale insulin  History of CVA: Patient has not been on aspirin unclear.  Gout: He complaining of knee and ankle pain.  We will start colchicine low-dose due to renal  Estimated body mass index is 29.81 kg/m as calculated from the following:   Height as of 06/04/21: 5\' 2"  (1.575 m).   Weight as of 06/04/21: 73.9 kg.   DVT prophylaxis: SCD Code Status: Full Code Family Communication: Son who was at bedside.  Disposition Plan:  Status is: Inpatient  Remains inpatient appropriate because: Patient admitted with severe anemia, AKI, hyperkalemia requiring transfusion IV fluids and IV antibiotics        Consultants:  GI Cardiology   Procedures:  Endoscopy   Antimicrobials:    Subjective: Patient is sleepy, wake up to voice.  He complains of pain in his knee and his ankle.  He denies blood in the stool or black stool  Objective: Vitals:   10/29/21 0600 10/29/21 0615 10/29/21 0645 10/29/21 0700  BP: 108/61 119/88 107/79 103/71  Pulse: (!) 127 (!) 118 (!) 120 (!) 120  Resp: 20 (!) 21 13 19   Temp:      TempSrc:      SpO2: 96% 97% 98% 96%    Intake/Output Summary (Last 24 hours) at 10/29/2021 0738 Last data filed at 10/29/2021 0230 Gross per 24 hour  Intake 2413.36 ml  Output --  Net 2413.36 ml   There were no vitals filed for this visit.  Examination:  General exam: Appears calm  and comfortable  Respiratory system: Clear to auscultation. Respiratory effort normal. Cardiovascular system: S1 & S2 heard, IRR. No JVD, murmurs, rubs, gallops or clicks. No pedal edema. Gastrointestinal system: Abdomen is nondistended, soft and nontender. No organomegaly or masses felt. Normal bowel sounds heard. Central nervous system: Alert Extremities: no edema   Data Reviewed: I have personally reviewed following labs and imaging studies  CBC: Recent Labs  Lab 10/28/21 1808 10/29/21 0521  WBC 7.1 6.8  NEUTROABS 3.9  --   HGB 6.4* 8.5*  HCT 19.8* 24.8*  MCV 99.0 87.6  PLT 101* 69*   Basic Metabolic Panel: Recent Labs  Lab 10/28/21 1808 10/28/21 2039 10/29/21 0521  NA 132* 136 137  K 6.2* 5.0 5.1  CL 102 105 107  CO2 14* 21* 22  GLUCOSE 250* 86 79  BUN 65* 58* 51*  CREATININE 3.54* 2.68* 2.05*  CALCIUM 8.6* 8.5* 8.6*   GFR: CrCl cannot be calculated (Unknown ideal weight.). Liver Function Tests: Recent Labs  Lab 10/28/21 1808  AST 26  ALT 23  ALKPHOS 78  BILITOT 1.0  PROT 5.8*  ALBUMIN 2.8*   No results for input(s): LIPASE, AMYLASE in the last 168 hours. No results for input(s): AMMONIA in the last 168 hours. Coagulation Profile: Recent Labs  Lab 10/28/21 1808  INR 1.3*   Cardiac Enzymes: No results for input(s): CKTOTAL, CKMB, CKMBINDEX, TROPONINI in the last 168 hours. BNP (last 3 results) No results for input(s): PROBNP in the last 8760 hours. HbA1C: No results for input(s): HGBA1C in the last 72 hours. CBG: No results for input(s): GLUCAP in the last 168 hours. Lipid Profile: No results for input(s): CHOL, HDL, LDLCALC, TRIG, CHOLHDL, LDLDIRECT in the last 72 hours. Thyroid Function Tests: No results for input(s): TSH, T4TOTAL, FREET4, T3FREE, THYROIDAB in the last 72 hours. Anemia Panel: No results for input(s): VITAMINB12, FOLATE, FERRITIN, TIBC, IRON, RETICCTPCT in the last 72 hours. Sepsis Labs: Recent Labs  Lab 10/28/21 1808  10/28/21 2025 10/28/21 2039 10/29/21 0521  PROCALCITON  --   --  0.43 0.59  LATICACIDVEN 6.8* 4.4*  --   --     Recent Results (from the past 240 hour(s))  Resp Panel by RT-PCR (Flu A&B, Covid) Nasopharyngeal Swab     Status: None   Collection Time: 10/28/21  6:48 PM   Specimen: Nasopharyngeal Swab; Nasopharyngeal(NP) swabs in vial transport medium  Result Value Ref Range Status   SARS Coronavirus 2 by RT PCR NEGATIVE NEGATIVE Final    Comment: (NOTE) SARS-CoV-2 target nucleic acids are NOT DETECTED.  The SARS-CoV-2 RNA is generally detectable in upper respiratory specimens during the acute phase of infection. The lowest concentration of SARS-CoV-2 viral copies this assay can detect is 138 copies/mL. A negative result does not preclude SARS-Cov-2 infection and should not be used as the sole basis for treatment or other patient management decisions. A negative result may occur with  improper specimen collection/handling, submission of specimen other than nasopharyngeal swab, presence of viral mutation(s) within the areas targeted by this assay, and inadequate number  of viral copies(<138 copies/mL). A negative result must be combined with clinical observations, patient history, and epidemiological information. The expected result is Negative.  Fact Sheet for Patients:  EntrepreneurPulse.com.au  Fact Sheet for Healthcare Providers:  IncredibleEmployment.be  This test is no t yet approved or cleared by the [place] FDA and  has been authorized for detection and/or diagnosis of SARS-CoV-2 by FDA under an Emergency Use Authorization (EUA). This EUA will remain  in effect (meaning this test can be used) for the duration of the COVID-19 declaration under Section 564(b)(1) of the Act, 21 U.S.C.section 360bbb-3(b)(1), unless the authorization is terminated  or revoked sooner.       Influenza A by PCR NEGATIVE NEGATIVE Final   Influenza B by  PCR NEGATIVE NEGATIVE Final    Comment: (NOTE) The Xpert Xpress SARS-CoV-2/FLU/RSV plus assay is intended as an aid in the diagnosis of influenza from Nasopharyngeal swab specimens and should not be used as a sole basis for treatment. Nasal washings and aspirates are unacceptable for Xpert Xpress SARS-CoV-2/FLU/RSV testing.  Fact Sheet for Patients: EntrepreneurPulse.com.au  Fact Sheet for Healthcare Providers: IncredibleEmployment.be  This test is not yet approved or cleared by the [place] FDA and has been authorized for detection and/or diagnosis of SARS-CoV-2 by FDA under an Emergency Use Authorization (EUA). This EUA will remain in effect (meaning this test can be used) for the duration of the COVID-19 declaration under Section 564(b)(1) of the Act, 21 U.S.C. section 360bbb-3(b)(1), unless the authorization is terminated or revoked.  Performed at [facility], [place] [address], [place], [place] [address]          Radiology Studies: CT HEAD WO CONTRAST (5MM)  Result Date: 10/28/2021 CLINICAL DATA:  Patient states he cannot: Self up since yesterday. EXAM: CT HEAD WITHOUT CONTRAST CT CERVICAL SPINE WITHOUT CONTRAST TECHNIQUE: Multidetector CT imaging of the head and cervical spine was performed following the standard protocol without intravenous contrast. Multiplanar CT image reconstructions of the cervical spine were also generated. COMPARISON:  CT scan of the brain December 19, 2019. FINDINGS: CT HEAD FINDINGS Brain: Chronic white matter changes are stable. No evidence of acute infarction, hemorrhage, hydrocephalus, extra-axial collection or mass lesion/mass effect. Vascular: Calcified atherosclerosis in the intracranial carotids. Skull: Normal. Negative for fracture or focal lesion. Sinuses/Orbits: No acute finding. Other: None. CT CERVICAL SPINE FINDINGS Alignment: Normal. Skull base and vertebrae: No acute fracture. No primary bone  lesion or focal pathologic process. Soft tissues and spinal canal: No prevertebral fluid or swelling. No visible canal hematoma. Disc levels:  Mild multilevel degenerative disc disease. Upper chest: Negative. Other: No other abnormalities. IMPRESSION: 1. No acute intracranial abnormalities. 2. No fracture or traumatic malalignment in the cervical spine. Electronically Signed   By: Dorise Bullion III M.D.   On: 10/28/2021 19:49   CT Cervical Spine Wo Contrast  Result Date: 10/28/2021 CLINICAL DATA:  Patient states he cannot: Self up since yesterday. EXAM: CT HEAD WITHOUT CONTRAST CT CERVICAL SPINE WITHOUT CONTRAST TECHNIQUE: Multidetector CT imaging of the head and cervical spine was performed following the standard protocol without intravenous contrast. Multiplanar CT image reconstructions of the cervical spine were also generated. COMPARISON:  CT scan of the brain December 19, 2019. FINDINGS: CT HEAD FINDINGS Brain: Chronic white matter changes are stable. No evidence of acute infarction, hemorrhage, hydrocephalus, extra-axial collection or mass lesion/mass effect. Vascular: Calcified atherosclerosis in the intracranial carotids. Skull: Normal. Negative for fracture or focal lesion. Sinuses/Orbits: No acute finding. Other: None.  CT CERVICAL SPINE FINDINGS Alignment: Normal. Skull base and vertebrae: No acute fracture. No primary bone lesion or focal pathologic process. Soft tissues and spinal canal: No prevertebral fluid or swelling. No visible canal hematoma. Disc levels:  Mild multilevel degenerative disc disease. Upper chest: Negative. Other: No other abnormalities. IMPRESSION: 1. No acute intracranial abnormalities. 2. No fracture or traumatic malalignment in the cervical spine. Electronically Signed   By: Dorise Bullion III M.D.   On: 10/28/2021 19:49   DG Chest Port 1 View  Result Date: 10/28/2021 CLINICAL DATA:  Sepsis EXAM: PORTABLE CHEST 1 VIEW COMPARISON:  04/25/2016 FINDINGS: The lungs are  symmetrically expanded. No focal pulmonary infiltrate identified. There is a nodular opacity within the right suprahilar region which may represent vascular shadow or a focal pulmonary nodule, but is not we well characterized ll on this examination. This does, however, appear more prominent than on prior examination. No pneumothorax or pleural effusion. Cardiac size is within normal limits. No acute bone abnormality. IMPRESSION: No radiographic evidence of acute cardiopulmonary disease. Nodular opacity within the right suprahilar region, indeterminate. Standard two view chest radiograph may be helpful in excluding and artifactual opacity. Electronically Signed   By: Fidela Salisbury M.D.   On: 10/28/2021 19:07        Scheduled Meds:  pantoprazole (PROTONIX) IV  40 mg Intravenous QHS   sodium zirconium cyclosilicate  10 g Oral Once   vancomycin variable dose per unstable renal function (pharmacist dosing)   Does not apply See admin instructions   Continuous Infusions:  sodium chloride Stopped (10/28/21 1945)   ceFEPime (MAXIPIME) IV     dextrose 5% lactated ringers       LOS: 1 day    Time spent: 35 minutes.     Elmarie Shiley, MD [facility]   If 7PM-7AM, please contact night-coverage [email or web address]  10/29/2021, 7:38 AM

## 2021-10-29 NOTE — Anesthesia Preprocedure Evaluation (Signed)
Anesthesia Evaluation  Patient identified by MRN, date of birth, ID band Patient awake    Reviewed: Allergy & Precautions, NPO status , Patient's Chart, lab work & pertinent test results  Airway Mallampati: II       Dental   Pulmonary    breath sounds clear to auscultation       Cardiovascular hypertension, + dysrhythmias  Rhythm:Regular Rate:Normal     Neuro/Psych CVA    GI/Hepatic negative GI ROS, Neg liver ROS,   Endo/Other  diabetes  Renal/GU Renal disease     Musculoskeletal   Abdominal   Peds  Hematology   Anesthesia Other Findings   Reproductive/Obstetrics                             Anesthesia Physical Anesthesia Plan  ASA: 3  Anesthesia Plan: MAC   Post-op Pain Management:    Induction:   PONV Risk Score and Plan: 1 and Ondansetron  Airway Management Planned: Simple Face Mask  Additional Equipment:   Intra-op Plan:   Post-operative Plan:   Informed Consent: I have reviewed the patients History and Physical, chart, labs and discussed the procedure including the risks, benefits and alternatives for the proposed anesthesia with the patient or authorized representative who has indicated his/her understanding and acceptance.       Plan Discussed with: CRNA and Anesthesiologist  Anesthesia Plan Comments:         Anesthesia Quick Evaluation

## 2021-10-29 NOTE — Progress Notes (Signed)
   10/29/21 1345  Assess: MEWS Score  Temp (!) 100.5 F (38.1 C)  BP 108/64  Pulse Rate (!) 102  ECG Heart Rate (!) 101  Resp 18  SpO2 94 %  O2 Device Room Air  Assess: MEWS Score  MEWS Temp 1  MEWS Systolic 0  MEWS Pulse 1  MEWS RR 0  MEWS LOC 0  MEWS Score 2  MEWS Score Color Yellow  Assess: if the MEWS score is Yellow or Red  Were vital signs taken at a resting state? Yes  Focused Assessment No change from prior assessment  Early Detection of Sepsis Score *See Row Information* High  MEWS guidelines implemented *See Row Information* No, other (Comment) (patient taken down to have EGD at 1350.)  Treat  MEWS Interventions Other (Comment) (sign placed on door for protocol but patient taken down to EGD at 1350.)  Pain Scale Faces  Faces Pain Scale 0  Escalate  MEWS: Escalate Yellow: discuss with charge nurse/RN and consider discussing with provider and RRT  Notify: Charge Nurse/RN  Name of Charge Nurse/RN Notified [place]  Date Charge Nurse/RN Notified 10/29/21  Time Charge Nurse/RN Notified 1400

## 2021-10-29 NOTE — Anesthesia Postprocedure Evaluation (Signed)
Anesthesia Post Note  [name]  Procedure(s) Performed: ESOPHAGOGASTRODUODENOSCOPY (EGD) WITH PROPOFOL BIOPSY     Patient location during evaluation: PACU Anesthesia Type: MAC Level of consciousness: awake Pain management: pain level controlled Vital Signs Assessment: post-procedure vital signs reviewed and stable Respiratory status: spontaneous breathing Cardiovascular status: stable Postop Assessment: no apparent nausea or vomiting Anesthetic complications: no   No notable events documented.  Last Vitals:  Vitals:   10/29/21 1944 10/29/21 2147  BP: 125/71 105/63  Pulse: (!) 114 (!) 126  Resp: 17 20  Temp: 37.1 C 37.3 C  SpO2: 95% 90%    Last Pain:  Vitals:   10/29/21 2147  TempSrc: Axillary  PainSc:                  Harrold Fitchett

## 2021-10-29 NOTE — Transfer of Care (Signed)
Immediate Anesthesia Transfer of Care Note  [name]  Procedure(s) Performed: ESOPHAGOGASTRODUODENOSCOPY (EGD) WITH PROPOFOL BIOPSY  Patient Location: PACU and Endoscopy Unit  Anesthesia Type:MAC  Level of Consciousness: drowsy and confused  Airway & Oxygen Therapy: Patient Spontanous Breathing and Patient connected to nasal cannula oxygen  Post-op Assessment: Report given to RN and Post -op Vital signs reviewed and stable  Post vital signs: Reviewed and stable  Last Vitals:  Vitals Value Taken Time  BP 96/55 10/29/21 1507  Temp    Pulse 107 10/29/21 1507  Resp 18 10/29/21 1507  SpO2 96 % 10/29/21 1507    Last Pain:  Vitals:   10/29/21 1345  TempSrc: Oral  PainSc:          Complications: No notable events documented.

## 2021-10-29 NOTE — Consult Note (Signed)
[facility] Consult: 8:51 AM 10/29/2021  LOS: 1 day    Referring Provider: Dr Tyrell Antonio Primary Care Physician:  Emelia Loron, NP  [facility]  clinic Primary Gastroenterologist:  unassigned    Reason for Consultation: Anemia.  FOBT +.     HPI: Eugene Cooper is a 80 y.o. male.   DM2.  Not on insulin.  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  Obesity.  Elevated triglycerides and cholesterol.  09/2020 contrasted CT showed hepatic steatosis,  Aortic atherosclerosis. Ostero arthritis of knee, s/p joint injection a month ago.    No previous colonoscopy or EGD.  Family reports patient has become less interactive when normally he is alert and oriented x3.  Decreased p.o. intake, generalized weakness, possible fall yesterday morning.  + knee pain.  Pt normally walks w walker but family put him in wheelchair in last 2 days.  More acute confusion developed yesterday.  Went to [facility] where he was anemic and advised to present to the ER.  No reports of melena, blood per rectum/bloody stools, nausea, vomiting, abdominal pain.   At arrival the patient was in A. fib rate into the 120s.  Tachypneic with good room air sats.  Hb 6.4 >> 2 PRBC >> 8.5, MCV 87.  Platelets 69K. INR 1.3.  ARF with GFR of 17, BUN/creatinine 65/3.5.  Initial sodium 132, now corrected at 137.  K 6.2 >> 4.2.  Calcium low 8.6.  Other than low albumin 2.8, LFTs normal.  Lactic acid 6.8. FOBT +.   Head CT unrevealing. CXR with indeterminate nodule in the right suprahilar region     Past Medical History:  Diagnosis Date   Diabetes ([facility])    Hyperlipemia    Hypertension    Stroke [facility])     History reviewed. No pertinent surgical history.  Prior to Admission medications   Medication Sig Start Date End Date Taking? Authorizing Provider  Accu-Chek Softclix Lancets  lancets USE UPTO FOUR TIMES DAILY AS DIRECTED 01/08/20   [provider]  acetaminophen (TYLENOL) 325 MG tablet Take 1-2 tablets (325-650 mg total) by mouth every 4 (four) hours as needed for mild pain. 01/07/20   Love, Ivan Anchors, PA-C  allopurinol (ZYLOPRIM) 300 MG tablet Take 1 tablet (300 mg total) by mouth daily. 06/04/21   Collier Salina, MD  aspirin 81 MG EC tablet Take 1 tablet (81 mg total) by mouth daily. 03/30/20   Jettie Booze, MD  atorvastatin (LIPITOR) 40 MG tablet Take 1 tablet (40 mg total) by mouth daily at 6 PM. 01/07/20   Love, Ivan Anchors, PA-C  blood glucose meter kit and supplies KIT Dispense based on patient and insurance preference. Use up to four times daily as directed. (FOR ICD-9 250.00, 250.01). 01/07/20   Love, Ivan Anchors, PA-C  gabapentin (NEURONTIN) 100 MG capsule Take 1 capsule (100 mg total) by mouth 3 (three) times daily. 01/07/20   Love, Ivan Anchors, PA-C  glipiZIDE (GLUCOTROL) 5 MG tablet Take 5 mg by mouth every morning. 01/11/20   [provider]  HYDROcodone-acetaminophen (NORCO/VICODIN) 5-325 MG tablet Take  0.5-1 tablets by mouth every 6 (six) hours as needed. 01/11/20   [provider]  lisinopril (ZESTRIL) 5 MG tablet Take 1 tablet (5 mg total) by mouth daily. 03/30/20   Jettie Booze, MD  Menthol-Methyl Salicylate (MUSCLE RUB) 10-15 % CREA Apply 1 application topically 2 (two) times daily as needed for muscle pain. To lower back 01/07/20   Love, Ivan Anchors, PA-C  metFORMIN (GLUCOPHAGE) 1000 MG tablet Take 1,000 mg by mouth 2 (two) times daily. 02/29/20   [provider]  Multiple Vitamin (MULTIVITAMIN WITH MINERALS) TABS tablet Take 1 tablet by mouth daily. 01/07/20   Love, Ivan Anchors, PA-C  senna-docusate (SENOKOT-S) 8.6-50 MG tablet Take 2 tablets by mouth 2 (two) times daily. Purchase over the counter 01/07/20   Love, Ivan Anchors, PA-C  topiramate (TOPAMAX) 25 MG tablet Take 1 tablet (25 mg total) by mouth 2 (two) times daily. 01/20/20    Kirsteins, Luanna Salk, MD  triamterene-hydrochlorothiazide (MAXZIDE-25) 37.5-25 MG tablet Take 1 tablet by mouth daily. 05/18/21   [provider]    Scheduled Meds:  colchicine  0.3 mg Oral Daily   pantoprazole (PROTONIX) IV  40 mg Intravenous Q12H   sodium zirconium cyclosilicate  10 g Oral Once   vancomycin variable dose per unstable renal function (pharmacist dosing)   Does not apply See admin instructions   Infusions:  sodium chloride Stopped (10/28/21 1945)   ceFEPime (MAXIPIME) IV     dextrose 5% lactated ringers 75 mL/hr at 10/29/21 0845   PRN Meds: metoprolol tartrate   Allergies as of 10/28/2021   (No Known Allergies)    Family History  Problem Relation Age of Onset   Stroke Mother    Lung disease Father    Diabetes Sister    Heart disease Sister    Hyperlipidemia Son    Hyperlipidemia Daughter    Hypertension Daughter     Social History   Socioeconomic History   Marital status: Single    Spouse name: Not on file   Number of children: Not on file   Years of education: Not on file   Highest education level: Not on file  Occupational History   Not on file  Tobacco Use   Smoking status: Never   Smokeless tobacco: Never  Vaping Use   Vaping Use: Never used  Substance and Sexual Activity   Alcohol use: Not Currently   Drug use: Never   Sexual activity: Not on file  Other Topics Concern   Not on file  Social History Narrative   Not on file   Social Determinants of Health   Financial Resource Strain: Not on file  Food Insecurity: Not on file  Transportation Needs: Not on file  Physical Activity: Not on file  Stress: Not on file  Social Connections: Not on file  Intimate Partner Violence: Not on file    REVIEW OF SYSTEMS: Constitutional: Less active as per HPI.  Not clear that this is fatigue but sounds like weakness. ENT:  No nose bleeds Pulm: No mention of shortness of breath or cough. CV:  No palpitations, no LE edema.  GU:  No  hematuria, no frequency GI: Per HPI. Heme: No reports of unusual or excessive bleeding or bruising. Transfusions: None per review of record. Neuro:  No headaches, no peripheral tingling or numbness Derm:  No itching, no rash or sores.  Endocrine:  No sweats or chills.  No polyuria or dysuria Immunization: Reviewed. Travel:  None    PHYSICAL  EXAM: Vital signs in last 24 hours: Vitals:   10/29/21 0730 10/29/21 0800  BP: (!) 110/53 117/70  Pulse: (!) 116 (!) 121  Resp: 17 17  Temp:    SpO2: 98% 96%   Wt Readings from Last 3 Encounters:  06/04/21 73.9 kg  03/30/20 72.8 kg  02/01/20 71.7 kg    General: Patient is debilitated, nonverbal at present.  Looks chronically ill and somewhat frail. Head: No facial asymmetry or swelling.  No signs of head trauma. Eyes: Conjunctiva pale.  No scleral icterus. Ears: Hearing appears to be intact, he is able to listen to and follow commands of his son who speaks [place]. Nose: No congestion or discharge Mouth: Dentures in place.  Mucosa is moist, pink, clear. Neck: No JVD, no masses, no thyromegaly. Lungs: Crackles throughout bilaterally.  No labored breathing.  No cough. Heart: Irregularly irregular.  No MRG.  S1, S2 present Abdomen: Soft without tenderness.  No HSM, masses, bruits, hernias..   Rectal: Deferred Musc/Skeltl: No joint redness, swelling or gross deformity. Extremities: Feet are warm.  Good pedal pulses. Neurologic: Nonverbal, currently following commands when requested in Spanish.  No tremor.  Moves all 4 limbs.  Strength not tested. Skin: No telangiectasia, sores or suspicious lesions.  No rash. Nodes: No cervical adenopathy Psych: Calm, cooperative.  Intake/Output from previous day: 11/20 0701 - 11/21 0700 In: 2413.4 [Blood:315; IV Piggyback:2098.4] Out: -  Intake/Output this shift: No intake/output data recorded.  LAB RESULTS: Recent Labs    10/28/21 1808 10/29/21 0521  WBC 7.1 6.8  HGB 6.4* 8.5*  HCT 19.8*  24.8*  PLT 101* 69*   BMET Lab Results  Component Value Date   NA 137 10/29/2021   NA 136 10/28/2021   NA 132 (L) 10/28/2021   K 5.1 10/29/2021   K 5.0 10/28/2021   K 6.2 (H) 10/28/2021   CL 107 10/29/2021   CL 105 10/28/2021   CL 102 10/28/2021   CO2 22 10/29/2021   CO2 21 (L) 10/28/2021   CO2 14 (L) 10/28/2021   GLUCOSE 79 10/29/2021   GLUCOSE 86 10/28/2021   GLUCOSE 250 (H) 10/28/2021   BUN 51 (H) 10/29/2021   BUN 58 (H) 10/28/2021   BUN 65 (H) 10/28/2021   CREATININE 2.05 (H) 10/29/2021   CREATININE 2.68 (H) 10/28/2021   CREATININE 3.54 (H) 10/28/2021   CALCIUM 8.6 (L) 10/29/2021   CALCIUM 8.5 (L) 10/28/2021   CALCIUM 8.6 (L) 10/28/2021   LFT Recent Labs    10/28/21 1808  PROT 5.8*  ALBUMIN 2.8*  AST 26  ALT 23  ALKPHOS 78  BILITOT 1.0   PT/INR Lab Results  Component Value Date   INR 1.3 (H) 10/28/2021   Hepatitis Panel No results for input(s): HEPBSAG, HCVAB, HEPAIGM, HEPBIGM in the last 72 hours. C-Diff No components found for: CDIFF Lipase     Component Value Date/Time   LIPASE 43 04/25/2016 1600    Drugs of Abuse  No results found for: LABOPIA, COCAINSCRNUR, LABBENZ, AMPHETMU, THCU, LABBARB   RADIOLOGY STUDIES: CT HEAD WO CONTRAST (5MM)  Result Date: 10/28/2021 CLINICAL DATA:  Patient states he cannot: Self up since yesterday. EXAM: CT HEAD WITHOUT CONTRAST CT CERVICAL SPINE WITHOUT CONTRAST TECHNIQUE: Multidetector CT imaging of the head and cervical spine was performed following the standard protocol without intravenous contrast. Multiplanar CT image reconstructions of the cervical spine were also generated. COMPARISON:  CT scan of the brain December 19, 2019. FINDINGS: CT HEAD FINDINGS Brain: Chronic white  matter changes are stable. No evidence of acute infarction, hemorrhage, hydrocephalus, extra-axial collection or mass lesion/mass effect. Vascular: Calcified atherosclerosis in the intracranial carotids. Skull: Normal. Negative for fracture  or focal lesion. Sinuses/Orbits: No acute finding. Other: None. CT CERVICAL SPINE FINDINGS Alignment: Normal. Skull base and vertebrae: No acute fracture. No primary bone lesion or focal pathologic process. Soft tissues and spinal canal: No prevertebral fluid or swelling. No visible canal hematoma. Disc levels:  Mild multilevel degenerative disc disease. Upper chest: Negative. Other: No other abnormalities. IMPRESSION: 1. No acute intracranial abnormalities. 2. No fracture or traumatic malalignment in the cervical spine. Electronically Signed   By: Dorise Bullion III M.D.   On: 10/28/2021 19:49   CT Cervical Spine Wo Contrast  Result Date: 10/28/2021 CLINICAL DATA:  Patient states he cannot: Self up since yesterday. EXAM: CT HEAD WITHOUT CONTRAST CT CERVICAL SPINE WITHOUT CONTRAST TECHNIQUE: Multidetector CT imaging of the head and cervical spine was performed following the standard protocol without intravenous contrast. Multiplanar CT image reconstructions of the cervical spine were also generated. COMPARISON:  CT scan of the brain December 19, 2019. FINDINGS: CT HEAD FINDINGS Brain: Chronic white matter changes are stable. No evidence of acute infarction, hemorrhage, hydrocephalus, extra-axial collection or mass lesion/mass effect. Vascular: Calcified atherosclerosis in the intracranial carotids. Skull: Normal. Negative for fracture or focal lesion. Sinuses/Orbits: No acute finding. Other: None. CT CERVICAL SPINE FINDINGS Alignment: Normal. Skull base and vertebrae: No acute fracture. No primary bone lesion or focal pathologic process. Soft tissues and spinal canal: No prevertebral fluid or swelling. No visible canal hematoma. Disc levels:  Mild multilevel degenerative disc disease. Upper chest: Negative. Other: No other abnormalities. IMPRESSION: 1. No acute intracranial abnormalities. 2. No fracture or traumatic malalignment in the cervical spine. Electronically Signed   By: Dorise Bullion III M.D.   On:  10/28/2021 19:49   DG Chest Port 1 View  Result Date: 10/28/2021 CLINICAL DATA:  Sepsis EXAM: PORTABLE CHEST 1 VIEW COMPARISON:  04/25/2016 FINDINGS: The lungs are symmetrically expanded. No focal pulmonary infiltrate identified. There is a nodular opacity within the right suprahilar region which may represent vascular shadow or a focal pulmonary nodule, but is not we well characterized ll on this examination. This does, however, appear more prominent than on prior examination. No pneumothorax or pleural effusion. Cardiac size is within normal limits. No acute bone abnormality. IMPRESSION: No radiographic evidence of acute cardiopulmonary disease. Nodular opacity within the right suprahilar region, indeterminate. Standard two view chest radiograph may be helpful in excluding and artifactual opacity. Electronically Signed   By: Fidela Salisbury M.D.   On: 10/28/2021 19:07      IMPRESSION:     FOBT + anemia.  Hgb 6.4 >> 8.5 after 2 PRBCs    New A fib.       AKI    Hx CVA.   Recent decline of pt engagement w family and confusion.  Head CT unrevealing  Hepatic steatosis on 09/2020 CT scan.  No evidence of cirrhosis.  Platelets are low.  INR slightly elevated.     Mild coagulopathy with INR 1.3.     Thrombocytopenia.    Meeting SIRS criteria.   Hyperkalemia, corrected.    PLAN:        EGD, ?Colonoscopy.  Timing TBD.  Currently NPO.  If no plans for procedure today, allow clear liquids.     Azucena Freed  10/29/2021, 8:51 AM Phone [phone]

## 2021-10-29 NOTE — Consult Note (Addendum)
Cardiology Consultation:   Patient ID: Eugene Cooper MRN: [id number]; DOB: July 01, 1941  Admit date: 10/28/2021 Date of Consult: 10/29/2021  PCP:  Emelia Loron, NP   [facility] Providers Cardiologist:  Larae Grooms, MD        Patient Profile:   Eugene Cooper is a 80 y.o. male with a hx of DM, HTN, HLD, CKD III, LBBB, CVA, who is being seen 10/29/2021 for the evaluation of Atrial fib at the request of Dr Tyrell Antonio.  History of Present Illness:   Eugene Cooper is being admitted today for altered mental status, labored respirations, and sepsis.  He normally is able to walk with a walker, but has been weak in the last couple of days.  No acute findings on chest x-rays and head CT.  He meets SIRS criteria for sepsis with tachycardia, tachypneic and a lactate of 6.8.  He was started on broad-spectrum antibiotics and given IV fluids.  He has new anemia with a hemoglobin initially of 6.4, s/p transfusion and now 8.5.  FOBT was positive.  GI was asked to see.  GI has seen, plan is for EGD and possibly colonoscopy with the timing to be determined.  The patient was felt to be in atrial fibrillation on admission and cardiology was asked to see him.  A video interpreter was used.  Eugene Cooper his is difficult to keep awake.  When aroused, he answer simple questions appropriately.  He gives 1 or 2 word answers, does not speak in complete sentences.  He complains of midepigastric pain.  He denies shortness of breath.  He keeps dozing off between questions.  He rouses to loud verbal and touch.  Limited information was obtainable.  No family is present.  He has felt his heart beat fast.  He has had pain in his mid epigastric region.  He cannot describe the pain or the palpitations further.  He does not feel short of breath.  He can feel his heart beating fast now.  No other information was obtainable.   Past Medical History:  Diagnosis Date   CKD (chronic kidney disease), stage III  (HCC)    Diabetes ([place])    Hyperlipemia    Hypertension    LBBB (left bundle branch block)    Stroke [facility])     History reviewed. No pertinent surgical history.   Home Medications:  Prior to Admission medications   Medication Sig Start Date End Date Taking? Authorizing Provider  Accu-Chek Softclix Lancets lancets USE UPTO FOUR TIMES DAILY AS DIRECTED 01/08/20   [provider]  acetaminophen (TYLENOL) 325 MG tablet Take 1-2 tablets (325-650 mg total) by mouth every 4 (four) hours as needed for mild pain. 01/07/20   Love, Ivan Anchors, PA-C  allopurinol (ZYLOPRIM) 300 MG tablet Take 1 tablet (300 mg total) by mouth daily. 06/04/21   Collier Salina, MD  aspirin 81 MG EC tablet Take 1 tablet (81 mg total) by mouth daily. 03/30/20   Jettie Booze, MD  atorvastatin (LIPITOR) 40 MG tablet Take 1 tablet (40 mg total) by mouth daily at 6 PM. 01/07/20   Love, Ivan Anchors, PA-C  blood glucose meter kit and supplies KIT Dispense based on patient and insurance preference. Use up to four times daily as directed. (FOR ICD-9 250.00, 250.01). 01/07/20   Love, Ivan Anchors, PA-C  gabapentin (NEURONTIN) 100 MG capsule Take 1 capsule (100 mg total) by mouth 3 (three) times daily. 01/07/20   Love, Ivan Anchors, PA-C  glipiZIDE (GLUCOTROL) 5  MG tablet Take 5 mg by mouth every morning. 01/11/20   [provider]  HYDROcodone-acetaminophen (NORCO/VICODIN) 5-325 MG tablet Take 0.5-1 tablets by mouth every 6 (six) hours as needed. 01/11/20   [provider]  lisinopril (ZESTRIL) 5 MG tablet Take 1 tablet (5 mg total) by mouth daily. 03/30/20   Jettie Booze, MD  Menthol-Methyl Salicylate (MUSCLE RUB) 10-15 % CREA Apply 1 application topically 2 (two) times daily as needed for muscle pain. To lower back 01/07/20   Love, Ivan Anchors, PA-C  metFORMIN (GLUCOPHAGE) 1000 MG tablet Take 1,000 mg by mouth 2 (two) times daily. 02/29/20   [provider]  Multiple Vitamin (MULTIVITAMIN WITH MINERALS)  TABS tablet Take 1 tablet by mouth daily. 01/07/20   Love, Ivan Anchors, PA-C  senna-docusate (SENOKOT-S) 8.6-50 MG tablet Take 2 tablets by mouth 2 (two) times daily. Purchase over the counter 01/07/20   Love, Ivan Anchors, PA-C  topiramate (TOPAMAX) 25 MG tablet Take 1 tablet (25 mg total) by mouth 2 (two) times daily. 01/20/20   Kirsteins, Luanna Salk, MD  triamterene-hydrochlorothiazide (MAXZIDE-25) 37.5-25 MG tablet Take 1 tablet by mouth daily. 05/18/21   [provider]    Inpatient Medications: Scheduled Meds:  colchicine  0.3 mg Oral Daily   pantoprazole (PROTONIX) IV  40 mg Intravenous Q12H   sodium zirconium cyclosilicate  10 g Oral Once   vancomycin variable dose per unstable renal function (pharmacist dosing)   Does not apply See admin instructions   Continuous Infusions:  sodium chloride Stopped (10/28/21 1945)   ceFEPime (MAXIPIME) IV     dextrose 5% lactated ringers 75 mL/hr at 10/29/21 0845   PRN Meds: metoprolol tartrate  Allergies:   No Known Allergies  Social History:   Social History   Socioeconomic History   Marital status: Single    Spouse name: Not on file   Number of children: Not on file   Years of education: Not on file   Highest education level: Not on file  Occupational History   Not on file  Tobacco Use   Smoking status: Never   Smokeless tobacco: Never  Vaping Use   Vaping Use: Never used  Substance and Sexual Activity   Alcohol use: Not Currently   Drug use: Never   Sexual activity: Not on file  Other Topics Concern   Not on file  Social History Narrative   Not on file   Social Determinants of Health   Financial Resource Strain: Not on file  Food Insecurity: Not on file  Transportation Needs: Not on file  Physical Activity: Not on file  Stress: Not on file  Social Connections: Not on file  Intimate Partner Violence: Not on file    Family History:   Family History  Problem Relation Age of Onset   Stroke Mother    Lung disease  Father    Diabetes Sister    Heart disease Sister    Hyperlipidemia Son    Hyperlipidemia Daughter    Hypertension Daughter      ROS:  Please see the history of present illness.  All other ROS reviewed and negative.     Physical Exam/Data:   Vitals:   10/29/21 1145 10/29/21 1215 10/29/21 1235 10/29/21 1245  BP: 111/61 106/71 90/66 107/61  Pulse: (!) 113 (!) 111 (!) 110 (!) 108  Resp: 20 16 (!) 21 20  Temp:      TempSrc:      SpO2: 96% 97% 96% 97%  Intake/Output Summary (Last 24 hours) at 10/29/2021 1324 Last data filed at 10/29/2021 1317 Gross per 24 hour  Intake 2413.36 ml  Output 525 ml  Net 1888.36 ml   Last 3 Weights 06/04/2021 03/30/2020 02/01/2020  Weight (lbs) 163 lb 160 lb 6.4 oz 158 lb  Weight (kg) 73.936 kg 72.757 kg 71.668 kg     There is no height or weight on file to calculate BMI.  General:  Well nourished, well developed, in no acute distress, dozes off if not stimulated HEENT: normal Neck: no JVD Vascular: No carotid bruits; Distal pulses 2+ bilaterally Cardiac:  normal S1, S2; RRR; soft murmur  Lungs:  clear to auscultation bilaterally, no wheezing, rhonchi or rales  Abd: soft, tender upper quadrant, no hepatomegaly  Ext: no edema Musculoskeletal:  No deformities, BUE and BLE strength weak but equal Skin: warm and dry  Neuro:  CNs 2-12 intact, no focal abnormalities noted Psych: Sleepy affect   EKG:  The EKG was personally reviewed and demonstrates:  Read as atrial fib, RVR, HR 127, LBBB is old However, I believe it is sinus tach Telemetry:  Telemetry was personally reviewed and demonstrates: Sinus tach  Relevant CV Studies:  ECHO: 10/29/2021  1. Left ventricular ejection fraction, by estimation, is 50 to 55%. Left  ventricular ejection fraction by 2D MOD biplane is 52.1 %. The left  ventricle has low normal function. The left ventricle has no regional wall motion abnormalities. There is moderate left ventricular hypertrophy. Left ventricular  diastolic function could not be evaluated.   2. Right ventricular systolic function is low normal. The right  ventricular size is normal. There is normal pulmonary artery systolic  pressure. The estimated right ventricular systolic pressure is 27.7 mmHg.   3. The mitral valve is grossly normal. No evidence of mitral valve  regurgitation.   4. The aortic valve is tricuspid. Aortic valve regurgitation is not  visualized.   5. The inferior vena cava is normal in size with greater than 50%  respiratory variability, suggesting right atrial pressure of 3 mmHg.   Comparison(s): Changes from prior study are noted. 12/20/2019: LVEF 55-60%.   MYOVIEW: 02/01/2020 Nuclear stress EF: 56%. The left ventricular ejection fraction is normal (55-65%). There was no ST segment deviation noted during stress. This is a low risk study. The study is normal.      Laboratory Data:  High Sensitivity Troponin:  No results for input(s): TROPONINIHS in the last 720 hours.   Chemistry Recent Labs  Lab 10/28/21 1808 10/28/21 2039 10/29/21 0521  NA 132* 136 137  K 6.2* 5.0 5.1  CL 102 105 107  CO2 14* 21* 22  GLUCOSE 250* 86 79  BUN 65* 58* 51*  CREATININE 3.54* 2.68* 2.05*  CALCIUM 8.6* 8.5* 8.6*  GFRNONAA 17* 23* 32*  ANIONGAP 16* 10 8    Recent Labs  Lab 10/28/21 1808  PROT 5.8*  ALBUMIN 2.8*  AST 26  ALT 23  ALKPHOS 78  BILITOT 1.0   Lipids No results for input(s): CHOL, TRIG, HDL, LABVLDL, LDLCALC, CHOLHDL in the last 168 hours.  Hematology Recent Labs  Lab 10/28/21 1808 10/29/21 0521  WBC 7.1 6.8  RBC 2.00* 2.83*  HGB 6.4* 8.5*  HCT 19.8* 24.8*  MCV 99.0 87.6  MCH 32.0 30.0  MCHC 32.3 34.3  RDW 17.2* 16.4*  PLT 101* 69*   Thyroid No results for input(s): TSH, FREET4 in the last 168 hours.  BNPNo results for input(s): BNP, PROBNP in the  last 168 hours.  DDimer No results for input(s): DDIMER in the last 168 hours.   Radiology/Studies:  CT HEAD WO CONTRAST (5MM)  Result  Date: 10/28/2021 CLINICAL DATA:  Patient states he cannot: Self up since yesterday. EXAM: CT HEAD WITHOUT CONTRAST CT CERVICAL SPINE WITHOUT CONTRAST TECHNIQUE: Multidetector CT imaging of the head and cervical spine was performed following the standard protocol without intravenous contrast. Multiplanar CT image reconstructions of the cervical spine were also generated. COMPARISON:  CT scan of the brain December 19, 2019. FINDINGS: CT HEAD FINDINGS Brain: Chronic white matter changes are stable. No evidence of acute infarction, hemorrhage, hydrocephalus, extra-axial collection or mass lesion/mass effect. Vascular: Calcified atherosclerosis in the intracranial carotids. Skull: Normal. Negative for fracture or focal lesion. Sinuses/Orbits: No acute finding. Other: None. CT CERVICAL SPINE FINDINGS Alignment: Normal. Skull base and vertebrae: No acute fracture. No primary bone lesion or focal pathologic process. Soft tissues and spinal canal: No prevertebral fluid or swelling. No visible canal hematoma. Disc levels:  Mild multilevel degenerative disc disease. Upper chest: Negative. Other: No other abnormalities. IMPRESSION: 1. No acute intracranial abnormalities. 2. No fracture or traumatic malalignment in the cervical spine. Electronically Signed   By: Dorise Bullion III M.D.   On: 10/28/2021 19:49   CT Cervical Spine Wo Contrast  Result Date: 10/28/2021 CLINICAL DATA:  Patient states he cannot: Self up since yesterday. EXAM: CT HEAD WITHOUT CONTRAST CT CERVICAL SPINE WITHOUT CONTRAST TECHNIQUE: Multidetector CT imaging of the head and cervical spine was performed following the standard protocol without intravenous contrast. Multiplanar CT image reconstructions of the cervical spine were also generated. COMPARISON:  CT scan of the brain December 19, 2019. FINDINGS: CT HEAD FINDINGS Brain: Chronic white matter changes are stable. No evidence of acute infarction, hemorrhage, hydrocephalus, extra-axial collection or  mass lesion/mass effect. Vascular: Calcified atherosclerosis in the intracranial carotids. Skull: Normal. Negative for fracture or focal lesion. Sinuses/Orbits: No acute finding. Other: None. CT CERVICAL SPINE FINDINGS Alignment: Normal. Skull base and vertebrae: No acute fracture. No primary bone lesion or focal pathologic process. Soft tissues and spinal canal: No prevertebral fluid or swelling. No visible canal hematoma. Disc levels:  Mild multilevel degenerative disc disease. Upper chest: Negative. Other: No other abnormalities. IMPRESSION: 1. No acute intracranial abnormalities. 2. No fracture or traumatic malalignment in the cervical spine. Electronically Signed   By: Dorise Bullion III M.D.   On: 10/28/2021 19:49   DG Chest Port 1 View  Result Date: 10/28/2021 CLINICAL DATA:  Sepsis EXAM: PORTABLE CHEST 1 VIEW COMPARISON:  04/25/2016 FINDINGS: The lungs are symmetrically expanded. No focal pulmonary infiltrate identified. There is a nodular opacity within the right suprahilar region which may represent vascular shadow or a focal pulmonary nodule, but is not we well characterized ll on this examination. This does, however, appear more prominent than on prior examination. No pneumothorax or pleural effusion. Cardiac size is within normal limits. No acute bone abnormality. IMPRESSION: No radiographic evidence of acute cardiopulmonary disease. Nodular opacity within the right suprahilar region, indeterminate. Standard two view chest radiograph may be helpful in excluding and artifactual opacity. Electronically Signed   By: Fidela Salisbury M.D.   On: 10/28/2021 19:07   ECHOCARDIOGRAM COMPLETE  Result Date: 10/29/2021    ECHOCARDIOGRAM REPORT   Patient Name:   DONTRELLE MAZON Date of Exam: 10/29/2021 Medical Rec #:  [id number]          Height:       62.0 in Accession #:  [id number]         Weight:       163.0 lb Date of Birth:  [birth year]          BSA:          1.753 m Patient Age:    20 years            BP:           108/61 mmHg Patient Gender: M                  HR:           117 bpm. Exam Location:  Inpatient Procedure: 2D Echo, Cardiac Doppler and Color Doppler Indications:    Afib  History:        Patient has prior history of Echocardiogram examinations, most                 recent 12/20/2019. Stroke; Risk Factors:Diabetes and                 Hypertension.  Sonographer:    Jyl Heinz Referring Phys: [id number] [place] T TU IMPRESSIONS  1. Left ventricular ejection fraction, by estimation, is 50 to 55%. Left ventricular ejection fraction by 2D MOD biplane is 52.1 %. The left ventricle has low normal function. The left ventricle has no regional wall motion abnormalities. There is moderate left ventricular hypertrophy. Left ventricular diastolic function could not be evaluated.  2. Right ventricular systolic function is low normal. The right ventricular size is normal. There is normal pulmonary artery systolic pressure. The estimated right ventricular systolic pressure is 59.5 mmHg.  3. The mitral valve is grossly normal. No evidence of mitral valve regurgitation.  4. The aortic valve is tricuspid. Aortic valve regurgitation is not visualized.  5. The inferior vena cava is normal in size with greater than 50% respiratory variability, suggesting right atrial pressure of 3 mmHg. Comparison(s): Changes from prior study are noted. 12/20/2019: LVEF 55-60%. FINDINGS  Left Ventricle: Left ventricular ejection fraction, by estimation, is 50 to 55%. Left ventricular ejection fraction by 2D MOD biplane is 52.1 %. The left ventricle has low normal function. The left ventricle has no regional wall motion abnormalities. The left ventricular internal cavity size was normal in size. There is moderate left ventricular hypertrophy. Left ventricular diastolic function could not be evaluated due to atrial fibrillation. Left ventricular diastolic function could not be evaluated. Right Ventricle: The right ventricular size is normal. No  increase in right ventricular wall thickness. Right ventricular systolic function is low normal. There is normal pulmonary artery systolic pressure. The tricuspid regurgitant velocity is 2.66 m/s,  and with an assumed right atrial pressure of 3 mmHg, the estimated right ventricular systolic pressure is 63.8 mmHg. Left Atrium: Left atrial size was normal in size. Right Atrium: Right atrial size was normal in size. Pericardium: There is no evidence of pericardial effusion. Mitral Valve: The mitral valve is grossly normal. No evidence of mitral valve regurgitation. Tricuspid Valve: The tricuspid valve is grossly normal. Tricuspid valve regurgitation is trivial. Aortic Valve: The aortic valve is tricuspid. Aortic valve regurgitation is not visualized. Aortic valve peak gradient measures 7.6 mmHg. Pulmonic Valve: The pulmonic valve was normal in structure. Pulmonic valve regurgitation is not visualized. Aorta: The aortic root and ascending aorta are structurally normal, with no evidence of dilitation. Venous: The inferior vena cava is normal in size with greater than 50% respiratory variability, suggesting right atrial pressure of 3 mmHg. IAS/Shunts: No  atrial level shunt detected by color flow Doppler.  LEFT VENTRICLE PLAX 2D                        Biplane EF (MOD) LVIDd:         4.50 cm         LV Biplane EF:   Left LVIDs:         3.80 cm                          ventricular LV PW:         1.30 cm                          ejection LV IVS:        1.30 cm                          fraction by LVOT diam:     2.00 cm                          2D MOD LV SV:         46                               biplane is LV SV Index:   26                               52.1 %. LVOT Area:     3.14 cm                                Diastology                                LV e' medial:    10.40 cm/s LV Volumes (MOD)               LV E/e' medial:  12.6 LV vol d, MOD    83.8 ml       LV e' lateral:   9.25 cm/s A2C:                            LV E/e' lateral: 14.2 LV vol d, MOD    107.0 ml A4C: LV vol s, MOD    39.1 ml A2C: LV vol s, MOD    49.3 ml A4C: LV SV MOD A2C:   44.7 ml LV SV MOD A4C:   107.0 ml LV SV MOD BP:    50.2 ml RIGHT VENTRICLE             IVC RV Basal diam:  2.40 cm     IVC diam: 1.80 cm RV Mid diam:    1.90 cm RV S prime:     10.00 cm/s TAPSE (M-mode): 1.7 cm LEFT ATRIUM             Index        RIGHT ATRIUM           Index LA diam:        2.90 cm 1.65  cm/m   RA Area:     11.50 cm LA Vol (A2C):   25.8 ml 14.72 ml/m  RA Volume:   20.80 ml  11.87 ml/m LA Vol (A4C):   44.8 ml 25.56 ml/m LA Biplane Vol: 35.6 ml 20.31 ml/m  AORTIC VALVE AV Area (Vmax): 2.23 cm AV Vmax:        138.00 cm/s AV Peak Grad:   7.6 mmHg LVOT Vmax:      97.90 cm/s LVOT Vmean:     77.500 cm/s LVOT VTI:       0.146 m  AORTA Ao Root diam: 3.70 cm Ao Asc diam:  3.40 cm MITRAL VALVE                TRICUSPID VALVE MV Area (PHT): 7.66 cm     TR Peak grad:   28.3 mmHg MV Decel Time: 99 msec      TR Vmax:        266.00 cm/s MV E velocity: 131.00 cm/s MV A velocity: 44.10 cm/s   SHUNTS MV E/A ratio:  2.97         Systemic VTI:  0.15 m                             Systemic Diam: 2.00 cm Lyman Bishop MD Electronically signed by Lyman Bishop MD Signature Date/Time: 10/29/2021/11:08:47 AM    Final      Assessment and Plan:   Possible atrial fibrillation: -MD to review, but I do not believe he is in atrial fibrillation or ever was - His initial ECG was hard to read with baseline wandering and artifact, but it is regular and in some leads P waves are visible. -Telemetry here in the ER is clearly sinus tach  He is acutely ill, with AMS, creatinine 3.54, potassium 6.2, lactic acid initially 6.8, H&H initially 6.4/19.8.  He has been hydrated transfused, the above values are improving.  Management per IM.   Risk Assessment/Risk Scores:      For questions or updates, please contact [place] Please consult [email or web address] for contact info under     Signed, Rosaria Ferries, PA-C  10/29/2021 1:24 PM

## 2021-10-29 NOTE — Progress Notes (Signed)
Patient's sons were out of hospital at time of procedure. Spoke w/ granddaughter Elige Shouse; she has knowledge of patient's medical history and current medications. Discussed hx and consent for procedure.

## 2021-10-29 NOTE — Progress Notes (Signed)
Patient arrived to the unit via stretcher from ED at 1340. Patient alert to voice but lethargic. Skin assessment completed and gown changed, on room air, connected to cardiac monitor. Vital signs and weight obtained, patient taken down to EGD at 1350. Socks and patient personal blanket at bedside.

## 2021-10-29 NOTE — Anesthesia Procedure Notes (Signed)
Procedure Name: MAC Date/Time: 10/29/2021 2:40 PM Performed by: Griffin Dakin, CRNA Pre-anesthesia Checklist: Patient identified, Emergency Drugs available, Suction available, Patient being monitored and Timeout performed Oxygen Delivery Method: Nasal cannula Induction Type: IV induction Placement Confirmation: positive ETCO2 and breath sounds checked- equal and bilateral Dental Injury: Teeth and Oropharynx as per pre-operative assessment

## 2021-10-29 NOTE — ED Notes (Signed)
Report given to Crist Fat, RN of [phone] via Epic message.

## 2021-10-29 NOTE — Op Note (Signed)
[facility] Patient Name: Eugene Cooper Procedure Date : 10/29/2021 MRN: [id number] Attending MD: Thornton Park MD, MD Date of Birth: [birth year] CSN: [id number] Age: 80 Admit Type: Inpatient Procedure:                Upper GI endoscopy Indications:              FOBT+ anemia presenting with symptomatic anemia                            with a hemoglobin of 6.4, no overt GI bleeding Providers:                Thornton Park MD, MD, Doristine Johns, RN, [facility] Technician, Technician Referring MD:              Medicines:                Monitored Anesthesia Care Complications:            No immediate complications. Estimated blood loss:                            Minimal. Estimated Blood Loss:     Estimated blood loss was minimal. Procedure:                Pre-Anesthesia Assessment:                           - Prior to the procedure, a History and Physical                            was performed, and patient medications and                            allergies were reviewed. The patient's tolerance of                            previous anesthesia was also reviewed. The risks                            and benefits of the procedure and the sedation                            options and risks were discussed with the patient.                            All questions were answered, and informed consent                            was obtained. Prior Anticoagulants: The patient has                            taken no previous anticoagulant or antiplatelet  agents. ASA Grade Assessment: III - A patient with                            severe systemic disease. After reviewing the risks                            and benefits, the patient was deemed in                            satisfactory condition to undergo the procedure.                           After obtaining informed consent, the endoscope was                             passed under direct vision. Throughout the                            procedure, the patient's blood pressure, pulse, and                            oxygen saturations were monitored continuously. The                            [id number]-H190 ([id number]) Olympus endoscope was introduced                            through the mouth, and advanced to the third part                            of duodenum. The upper GI endoscopy was                            accomplished without difficulty. The patient                            tolerated the procedure well. Scope In: Scope Out: Findings:      The examined esophagus was normal.      Multiple small erosions with no bleeding and no stigmata of recent       bleeding were found in the gastric body and in the gastric antrum.       Biopsies were taken from the antrum, body, and fundus with a cold       forceps for histology. Estimated blood loss was minimal.      Diffuse mild inflammation characterized by congestion (edema) was found       in the gastric fundus. Biopsies obtained in the random colon biopsy       samples.      The examined duodenum was normal.      The exam was otherwise without abnormality. No blood or evidence for       recent bleeding identified. Impression:               - Normal esophagus.                           -  Erosive gastropathy with no bleeding and no                            stigmata of recent bleeding. Biopsied.                           - Gastritis.                           - Normal examined duodenum.                           - No evidence for active or recent bleeding. Recommendation:           - Return patient to hospital ward for ongoing care.                           - Clear liquid diet. No red liquids, please.                           - Continue present medications including PPI                            therapy.                           - Avoid all NSAIDS.                           - Await  pathology results.                           - Continue serial hgb/hct with transfusion as                            indicated.                           - Consider evaluation for non-GI causes of anemia                            and acute thrombocytopenia.                           - Consider colonoscopy when he is alert enough to                            participate in the bowel prep.                           Results discussed with the patient's son and                            grand-daughter by telephone. All questions answered                            to their satisfaction. Procedure Code(s):        ---  Professional ---                           [phone], Esophagogastroduodenoscopy, flexible,                            transoral; with biopsy, single or multiple Diagnosis Code(s):        --- Professional ---                           K31.89, Other diseases of stomach and duodenum                           K29.70, Gastritis, unspecified, without bleeding CPT copyright 2019 [employer]. All rights reserved. The codes documented in this report are preliminary and upon coder review may  be revised to meet current compliance requirements. Thornton Park MD, MD 10/29/2021 3:12:29 PM This report has been signed electronically. Number of Addenda: 0

## 2021-10-30 ENCOUNTER — Inpatient Hospital Stay (HOSPITAL_COMMUNITY): Payer: Medicare Other

## 2021-10-30 DIAGNOSIS — D62 Acute posthemorrhagic anemia: Secondary | ICD-10-CM | POA: Diagnosis not present

## 2021-10-30 DIAGNOSIS — A419 Sepsis, unspecified organism: Secondary | ICD-10-CM | POA: Diagnosis not present

## 2021-10-30 LAB — BASIC METABOLIC PANEL
Anion gap: 9 (ref 5–15)
BUN: 36 mg/dL — ABNORMAL HIGH (ref 8–23)
CO2: 21 mmol/L — ABNORMAL LOW (ref 22–32)
Calcium: 8.2 mg/dL — ABNORMAL LOW (ref 8.9–10.3)
Chloride: 105 mmol/L (ref 98–111)
Creatinine, Ser: 1.5 mg/dL — ABNORMAL HIGH (ref 0.61–1.24)
GFR, Estimated: 47 mL/min — ABNORMAL LOW (ref >60)
Glucose, Bld: 203 mg/dL — ABNORMAL HIGH (ref 70–99)
Potassium: 4.1 mmol/L (ref 3.5–5.1)
Sodium: 135 mmol/L (ref 135–145)

## 2021-10-30 LAB — CBC
HCT: 21.8 % — ABNORMAL LOW (ref 39.0–52.0)
HCT: 23.3 % — ABNORMAL LOW (ref 39.0–52.0)
Hemoglobin: 7.6 g/dL — ABNORMAL LOW (ref 13.0–17.0)
Hemoglobin: 7.8 g/dL — ABNORMAL LOW (ref 13.0–17.0)
MCH: 30.8 pg (ref 26.0–34.0)
MCH: 30.1 pg (ref 26.0–34.0)
MCHC: 34.9 g/dL (ref 30.0–36.0)
MCHC: 33.5 g/dL (ref 30.0–36.0)
MCV: 88.3 fL (ref 80.0–100.0)
MCV: 90 fL (ref 80.0–100.0)
Platelets: 60 10*3/uL — ABNORMAL LOW (ref 150–400)
Platelets: 53 10*3/uL — ABNORMAL LOW (ref 150–400)
RBC: 2.47 MIL/uL — ABNORMAL LOW (ref 4.22–5.81)
RBC: 2.59 MIL/uL — ABNORMAL LOW (ref 4.22–5.81)
RDW: 16.8 % — ABNORMAL HIGH (ref 11.5–15.5)
RDW: 17.1 % — ABNORMAL HIGH (ref 11.5–15.5)
WBC: 5.7 10*3/uL (ref 4.0–10.5)
WBC: 6.3 10*3/uL (ref 4.0–10.5)
nRBC: 0.4 % — ABNORMAL HIGH (ref 0.0–0.2)
nRBC: 0.3 % — ABNORMAL HIGH (ref 0.0–0.2)

## 2021-10-30 LAB — LACTIC ACID, PLASMA: Lactic Acid, Venous: 1.2 mmol/L (ref 0.5–1.9)

## 2021-10-30 LAB — IRON AND TIBC
Iron: 15 ug/dL — ABNORMAL LOW (ref 45–182)
Saturation Ratios: 7 % — ABNORMAL LOW (ref 17.9–39.5)
TIBC: 209 ug/dL — ABNORMAL LOW (ref 250–450)
UIBC: 194 ug/dL

## 2021-10-30 LAB — FOLATE: Folate: 14 ng/mL (ref >5.9)

## 2021-10-30 LAB — GLUCOSE, CAPILLARY
Glucose-Capillary: 178 mg/dL — ABNORMAL HIGH (ref 70–99)
Glucose-Capillary: 172 mg/dL — ABNORMAL HIGH (ref 70–99)
Glucose-Capillary: 215 mg/dL — ABNORMAL HIGH (ref 70–99)
Glucose-Capillary: 184 mg/dL — ABNORMAL HIGH (ref 70–99)
Glucose-Capillary: 232 mg/dL — ABNORMAL HIGH (ref 70–99)

## 2021-10-30 LAB — VITAMIN B12: Vitamin B-12: 3633 pg/mL — ABNORMAL HIGH (ref 180–914)

## 2021-10-30 LAB — RETICULOCYTES
Immature Retic Fract: 19.8 % — ABNORMAL HIGH (ref 2.3–15.9)
RBC.: 2.45 MIL/uL — ABNORMAL LOW (ref 4.22–5.81)
Retic Count, Absolute: 16.9 10*3/uL — ABNORMAL LOW (ref 19.0–186.0)
Retic Ct Pct: 0.7 % (ref 0.4–3.1)

## 2021-10-30 LAB — PROCALCITONIN: Procalcitonin: 0.7 ng/mL

## 2021-10-30 LAB — FERRITIN: Ferritin: 902 ng/mL — ABNORMAL HIGH (ref 24–336)

## 2021-10-30 LAB — PREPARE RBC (CROSSMATCH)

## 2021-10-30 MED ORDER — SODIUM CHLORIDE 0.9 % IV SOLN
2.0000 g | Freq: Two times a day (BID) | INTRAVENOUS | Status: DC
  Administered 2021-10-30 – 2021-10-31 (×3): 2 g via INTRAVENOUS
  Filled 2021-10-30 (×3): qty 2

## 2021-10-30 MED ORDER — SODIUM CHLORIDE 0.9 % IV BOLUS
500.0000 mL | Freq: Once | INTRAVENOUS | Status: AC | PRN
  Administered 2021-10-30: 500 mL via INTRAVENOUS

## 2021-10-30 MED ORDER — VANCOMYCIN HCL 1000 MG/200ML IV SOLN
1000.0000 mg | INTRAVENOUS | Status: DC
  Administered 2021-10-30: 1000 mg via INTRAVENOUS
  Filled 2021-10-30 (×2): qty 200

## 2021-10-30 MED ORDER — ACETAMINOPHEN 325 MG PO TABS
325.0000 mg | ORAL_TABLET | Freq: Once | ORAL | Status: AC
  Administered 2021-10-30: 325 mg via ORAL
  Filled 2021-10-30: qty 1

## 2021-10-30 MED ORDER — PREDNISONE 20 MG PO TABS
20.0000 mg | ORAL_TABLET | Freq: Once | ORAL | Status: AC
  Administered 2021-10-30: 20 mg via ORAL
  Filled 2021-10-30: qty 1

## 2021-10-30 MED ORDER — SODIUM CHLORIDE 0.9% IV SOLUTION: Freq: Once | INTRAVENOUS | Status: AC

## 2021-10-30 NOTE — Progress Notes (Addendum)
PROGRESS NOTE    Eugene Cooper  YTK:[id number] DOB: May 30, 1941 DOA: 10/28/2021 PCP: Emelia Loron, NP   Brief Narrative: 80 year old with past medical history significant for CVA, LBBB, diabetes type 2, hyperlipidemia who presents with concern for altered mental status and labile respiration.  Over the last 2 days patient has had decreased appetite, not eating or drinking.  Family noticed increased labored breathing.  Patient complaining of bilateral knee pain and ankle pain.    Evaluation in the ED: Patient was noted to be tachycardic heart rate up to the 120 in A. fib, tachypneic, hemoglobin 6.4, lactate of 6.8 FOBT positive.  K of 6.2 creatinine 3.5 anion gap 16.  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  -Sepsis POA>  -Patient presented with tachycardia, tachypnea and lactic acid of 6.8. -He received IV fluids. -Continue with broad-spectrum antibiotics. -Repeated chest x ray: No signs of pulmonary edema or focal pulmonary consolidation.  -COVID PCR negative. -Urine culture: No growth to date. -Blood cultures: No growth  To date. -CT abdomen and pelvis order: No acute abnormality in the abdomen or pelvis.  Partially imaged consolidative opacity left lower lobe may reflect atelectasis versus pneumonia -no significant knee edema, or ankle edema.   Hypotension: Possible secondary to hypovolemia,  anemia,  Received blood transfusion and IV fluids.  Acute blood loss anemia: Iron deficiency anemia Patient presented with a hemoglobin of 6.4.  Was fourteen 1 year ago.  FOBT positive. He received 2 unit of packed red blood cell 11/21.   IV Protonix twice daily GI Consulted. Underwent endoscopy: Showed gastric erosions and gastritis. Per GI patient will benefit from colonoscopy when he is able to tolerate prep.  Hb 7.8-- still tachycardia, low BP overnight. Plan to proceed with another Unit PRBC>  We will need iron supplements when source of infection is controlled.  Thrombocytopenia;  Might be related to infectious process.  Will check Peripheral smear.  No hyperbilirubinemia, unlikely hemolysis.    Tachycardia, sinus.  Evaluated by cardiology: no evidence of A fib.  Systolic blood pressure soft. IV as needed metoprolol Plan to give another blood transfusion.  ECHO; Ef 55 %   Possible nodular density, seeing previous chest x-ray in the medial right upper lung field which is partially obscured by cardiac monitoring with a repeated chest x-ray. -Patient will need short-term CT follow-up   AKI on CKD stage 3a: Prior creatinine 1.4 In the setting of hypovolemia hypotension. Received IV fluids. Renal function improving. Presents  with a creatinine of 3.5. Cr down to 1.5, improving.   Hyperkalemia: Due to AKI. He received calcium gluconate, insulin bicarb and Lokelma. Resolved.   Diabetes type 2 with hyperglycemia: Continue with a sliding scale insulin  Acute metabolic encephalopathy: Patient is more alert today, he is answering few questions.  He was able to recognize his son, did not recognize daughter-in-law.  Per family he has improved but  is not back to his baseline. No nuchal rigidity encephalopathy related to infectious process and fever.  History of CVA: Patient has not been on aspirin unclear.  Gout: He complaining of knee and ankle pain.  Started  colchicine  low-dose due to renal.  Will order one time dose prednisone to see if help with pain.   Estimated body mass index is 25.65 kg/m as calculated from the following:   Height as of this encounter: 5\' 2"  (1.575 m).   Weight as of this encounter: 63.6 kg.   DVT prophylaxis: SCD Code Status: Full Code Family Communication: Son who was at bedside.  Disposition Plan:  Status is: Inpatient  Remains inpatient appropriate because: Patient admitted with severe anemia, AKI, hyperkalemia requiring transfusion IV fluids and IV antibiotics        Consultants:  GI Cardiology   Procedures:  Endoscopy   Antimicrobials:    Subjective: He is more alert today, he was able to recognize his son.  He told me that he has 8 children's and that is accurate.  He told me his wife name.  He did not recognize his daughter-in-law.  He was not able to tell me that he was at the hospital. He reports some abdominal pain.  Objective: Vitals:   10/30/21 0700 10/30/21 0957 10/30/21 1211 10/30/21 1251  BP: 103/64  (!) 115/97   Pulse: 93  100 98  Resp: 18  18   Temp: 98.5 F (36.9 C)  100 F (37.8 C) 99.2 F (37.3 C)  TempSrc: Oral  Oral Axillary  SpO2: 95% 93% 96%   Weight:      Height:        Intake/Output Summary (Last 24 hours) at 10/30/2021 1541 Last data filed at 10/30/2021 1500 Gross per 24 hour  Intake 314.54 ml  Output 825 ml  Net -510.46 ml    Filed Weights   10/29/21 1345  Weight: 63.6 kg    Examination:  General exam: Sleepy, ill-appearing Respiratory system: Clear to auscultation Cardiovascular system: S1, S2 regular RRR, Tachycardic  Gastrointestinal system:BS present, soft, nt  Central nervous system: Alert, follows command Extremities: No edema   Data Reviewed: I have personally reviewed following labs and imaging studies  CBC: Recent Labs  Lab 10/28/21 1808 10/29/21 0521 10/29/21 1559 10/30/21 0117 10/30/21 0743  WBC 7.1 6.8 9.1 5.7 6.3  NEUTROABS 3.9  --    --   --   --   HGB 6.4* 8.5* 8.6* 7.6* 7.8*  HCT 19.8* 24.8* 25.7* 21.8* 23.3*  MCV 99.0 87.6 88.9 88.3 90.0  PLT 101* 69* 73* 60* 53*    Basic Metabolic Panel: Recent Labs  Lab 10/28/21 1808 10/28/21 2039 10/29/21 0521 10/30/21 0117  NA 132* 136 137 135  K 6.2* 5.0 5.1 4.1  CL 102 105 107 105  CO2 14* 21* 22 21*  GLUCOSE 250* 86 79 203*  BUN 65* 58* 51* 36*  CREATININE 3.54* 2.68* 2.05* 1.50*  CALCIUM 8.6* 8.5* 8.6* 8.2*    GFR: Estimated Creatinine Clearance: 30.3 mL/min (A) (by C-G formula based on SCr of 1.5 mg/dL (H)). Liver Function Tests: Recent Labs  Lab 10/28/21 1808  AST 26  ALT 23  ALKPHOS 78  BILITOT 1.0  PROT 5.8*  ALBUMIN 2.8*    No results for input(s): LIPASE, AMYLASE in the last 168 hours. No results for input(s): AMMONIA in the last 168 hours. Coagulation Profile: Recent Labs  Lab 10/28/21 1808  INR 1.3*  Cardiac Enzymes: No results for input(s): CKTOTAL, CKMB, CKMBINDEX, TROPONINI in the last 168 hours. BNP (last 3 results) No results for input(s): PROBNP in the last 8760 hours. HbA1C: Recent Labs    10/29/21 1559  HGBA1C 7.5*   CBG: Recent Labs  Lab 10/29/21 1441 10/29/21 1654 10/30/21 0641 10/30/21 0752 10/30/21 1209  GLUCAP 110* 138* 178* 172* 215*   Lipid Profile: No results for input(s): CHOL, HDL, LDLCALC, TRIG, CHOLHDL, LDLDIRECT in the last 72 hours. Thyroid Function Tests: No results for input(s): TSH, T4TOTAL, FREET4, T3FREE, THYROIDAB in the last 72 hours. Anemia Panel: Recent Labs    10/30/21 0117  VITAMINB12 3,633*  FOLATE 14.0  FERRITIN 902*  TIBC 209*  IRON 15*  RETICCTPCT 0.7   Sepsis Labs: Recent Labs  Lab 10/28/21 1808 10/28/21 2025 10/28/21 2039 10/29/21 0521 10/30/21 0117  PROCALCITON  --   --  0.43 0.59 0.70  LATICACIDVEN 6.8* 4.4*  --   --  1.2     Recent Results (from the past 240 hour(s))  Blood Culture (routine x 2)     Status: None (Preliminary result)   Collection Time:  10/28/21  6:20 PM   Specimen: BLOOD LEFT HAND  Result Value Ref Range Status   Specimen Description BLOOD LEFT HAND  Final   Special Requests   Final    BOTTLES DRAWN AEROBIC ONLY Blood Culture adequate volume   Culture   Final    NO GROWTH 2 DAYS Performed at [facility], [place] [address], [place], [place] [address]    Report Status PENDING  Incomplete  Blood Culture (routine x 2)     Status: None (Preliminary result)   Collection Time: 10/28/21  6:46 PM   Specimen: BLOOD LEFT ARM  Result Value Ref Range Status   Specimen Description BLOOD LEFT ARM  Final   Special Requests   Final    BOTTLES DRAWN AEROBIC AND ANAEROBIC Blood Culture adequate volume   Culture   Final    NO GROWTH 2 DAYS Performed at [facility], [place] [address], [place], [place] [address]    Report Status PENDING  Incomplete  Resp Panel by RT-PCR (Flu A&B, Covid) Nasopharyngeal Swab     Status: None   Collection Time: 10/28/21  6:48 PM   Specimen: Nasopharyngeal Swab; Nasopharyngeal(NP) swabs in vial transport medium  Result Value Ref Range Status   SARS Coronavirus 2 by RT PCR NEGATIVE NEGATIVE Final    Comment: (NOTE) SARS-CoV-2 target nucleic acids are NOT DETECTED.  The SARS-CoV-2 RNA is generally detectable in upper respiratory specimens during the acute phase of infection. The lowest concentration of SARS-CoV-2 viral copies this assay can detect is 138 copies/mL. A negative result does not preclude SARS-Cov-2 infection and should not be used as the sole basis for treatment or other patient management decisions. A negative result may occur with  improper specimen collection/handling, submission of specimen other than nasopharyngeal swab, presence of viral mutation(s) within the areas targeted by this assay, and inadequate number of viral copies(<138 copies/mL). A negative result must be combined with clinical observations, patient history, and epidemiological information. The expected result  is Negative.  Fact Sheet for Patients:  EntrepreneurPulse.com.au  Fact Sheet for Healthcare Providers:  IncredibleEmployment.be  This test is no t yet approved or cleared by the [place] FDA and  has been authorized for detection and/or diagnosis of SARS-CoV-2 by FDA under an Emergency Use Authorization (EUA). This EUA will remain  in effect (  meaning this test can be used) for the duration of the COVID-19 declaration under Section 564(b)(1) of the Act, 21 U.S.C.section 360bbb-3(b)(1), unless the authorization is terminated  or revoked sooner.       Influenza A by PCR NEGATIVE NEGATIVE Final   Influenza B by PCR NEGATIVE NEGATIVE Final    Comment: (NOTE) The Xpert Xpress SARS-CoV-2/FLU/RSV plus assay is intended as an aid in the diagnosis of influenza from Nasopharyngeal swab specimens and should not be used as a sole basis for treatment. Nasal washings and aspirates are unacceptable for Xpert Xpress SARS-CoV-2/FLU/RSV testing.  Fact Sheet for Patients: EntrepreneurPulse.com.au  Fact Sheet for Healthcare Providers: IncredibleEmployment.be  This test is not yet approved or cleared by the [place] FDA and has been authorized for detection and/or diagnosis of SARS-CoV-2 by FDA under an Emergency Use Authorization (EUA). This EUA will remain in effect (meaning this test can be used) for the duration of the COVID-19 declaration under Section 564(b)(1) of the Act, 21 U.S.C. section 360bbb-3(b)(1), unless the authorization is terminated or revoked.  Performed at [facility], [place] [address], [place], [place] [address]   Urine Culture     Status: None   Collection Time: 10/28/21  8:51 PM   Specimen: In/Out Cath Urine  Result Value Ref Range Status   Specimen Description IN/OUT CATH URINE  Final   Special Requests NONE  Final   Culture   Final    NO GROWTH Performed at [place], [place] [address], [place], [place] [address]    Report Status 10/29/2021 FINAL  Final  MRSA Next Gen by PCR, Nasal     Status: None   Collection Time: 10/29/21  8:51 AM   Specimen: Nasal Mucosa; Nasal Swab  Result Value Ref Range Status   MRSA by PCR Next Gen NOT DETECTED NOT DETECTED Final    Comment: (NOTE) The GeneXpert MRSA Assay (FDA approved for NASAL specimens only), is one component of a comprehensive MRSA colonization surveillance program. It is not intended to diagnose MRSA infection nor to guide or monitor treatment for MRSA infections. Test performance is not FDA approved in patients less than 55 years old. Performed at [facility], [place] [address] [place],  [address]           Radiology Studies: CT ABDOMEN PELVIS WO CONTRAST  Result Date: 10/30/2021 CLINICAL DATA:  Fever of unknown origin EXAM: CT ABDOMEN AND PELVIS WITHOUT CONTRAST TECHNIQUE: Multidetector CT imaging of the abdomen and pelvis was performed following the standard protocol without IV contrast. COMPARISON:  January 2021 FINDINGS: Lower chest: Partially imaged left lower lobe consolidative opacity. Additional bibasilar atelectasis. Hepatobiliary: No focal liver abnormality is seen. No gallstones, gallbladder wall thickening, or biliary dilatation. Pancreas: Unremarkable. Spleen: Unremarkable. Adrenals/Urinary Tract: Adrenals, kidneys, and bladder are unremarkable. Stomach/Bowel: Stomach is within normal limits. Bowel is normal in caliber. Vascular/Lymphatic: Mild aortic atherosclerosis. No enlarged lymph nodes. Reproductive: Unremarkable. Other: No free fluid.  Abdominal wall is unremarkable. Musculoskeletal: Lumbar spine degenerative changes. No acute osseous abnormality. IMPRESSION: Partially imaged consolidative opacity left lower lobe may reflect atelectasis or pneumonia. No acute abnormality in the abdomen or pelvis. Electronically Signed   By: Macy Mis M.D.   On: 10/30/2021 14:06   DG  Chest 2 View  Result Date: 10/30/2021 CLINICAL DATA:  Fever EXAM: CHEST - 2 VIEW COMPARISON:  10/28/2021 FINDINGS: Transverse diameter of heart is increased. There are no signs of pulmonary edema or focal pulmonary  consolidation. In the immediate previous study there was a possible nodular density described in the medial right upper lung fields. In the current study, there is a cardiac monitoring lead superimposed over the region of possible nodule seen in the previous examination limiting evaluation for a nodule. There is no pleural effusion or pneumothorax. IMPRESSION: Cardiomegaly. There are no signs of pulmonary edema or focal pulmonary consolidation. In the previous study there was a possible nodular density in the medial right upper lung fields which is partly obscured by cardiac monitoring lead limiting comparison. Short-term follow-up chest radiographs and CT if warranted should be considered. Electronically Signed   By: Elmer Picker M.D.   On: 10/30/2021 08:38   CT HEAD WO CONTRAST (5MM)  Result Date: 10/28/2021 CLINICAL DATA:  Patient states he cannot: Self up since yesterday. EXAM: CT HEAD WITHOUT CONTRAST CT CERVICAL SPINE WITHOUT CONTRAST TECHNIQUE: Multidetector CT imaging of the head and cervical spine was performed following the standard protocol without intravenous contrast. Multiplanar CT image reconstructions of the cervical spine were also generated. COMPARISON:  CT scan of the brain December 19, 2019. FINDINGS: CT HEAD FINDINGS Brain: Chronic white matter changes are stable. No evidence of acute infarction, hemorrhage, hydrocephalus, extra-axial collection or mass lesion/mass effect. Vascular: Calcified atherosclerosis in the intracranial carotids. Skull: Normal. Negative for fracture or focal lesion. Sinuses/Orbits: No acute finding. Other: None. CT CERVICAL SPINE FINDINGS Alignment: Normal. Skull base and vertebrae: No acute fracture. No primary bone lesion or focal pathologic  process. Soft tissues and spinal canal: No prevertebral fluid or swelling. No visible canal hematoma. Disc levels:  Mild multilevel degenerative disc disease. Upper chest: Negative. Other: No other abnormalities. IMPRESSION: 1. No acute intracranial abnormalities. 2. No fracture or traumatic malalignment in the cervical spine. Electronically Signed   By: Dorise Bullion III M.D.   On: 10/28/2021 19:49   CT Cervical Spine Wo Contrast  Result Date: 10/28/2021 CLINICAL DATA:  Patient states he cannot: Self up since yesterday. EXAM: CT HEAD WITHOUT CONTRAST CT CERVICAL SPINE WITHOUT CONTRAST TECHNIQUE: Multidetector CT imaging of the head and cervical spine was performed following the standard protocol without intravenous contrast. Multiplanar CT image reconstructions of the cervical spine were also generated. COMPARISON:  CT scan of the brain December 19, 2019. FINDINGS: CT HEAD FINDINGS Brain: Chronic white matter changes are stable. No evidence of acute infarction, hemorrhage, hydrocephalus, extra-axial collection or mass lesion/mass effect. Vascular: Calcified atherosclerosis in the intracranial carotids. Skull: Normal. Negative for fracture or focal lesion. Sinuses/Orbits: No acute finding. Other: None. CT CERVICAL SPINE FINDINGS Alignment: Normal. Skull base and vertebrae: No acute fracture. No primary bone lesion or focal pathologic process. Soft tissues and spinal canal: No prevertebral fluid or swelling. No visible canal hematoma. Disc levels:  Mild multilevel degenerative disc disease. Upper chest: Negative. Other: No other abnormalities. IMPRESSION: 1. No acute intracranial abnormalities. 2. No fracture or traumatic malalignment in the cervical spine. Electronically Signed   By: Dorise Bullion III M.D.   On: 10/28/2021 19:49   DG Chest Port 1 View  Result Date: 10/28/2021 CLINICAL DATA:  Sepsis EXAM: PORTABLE CHEST 1 VIEW COMPARISON:  04/25/2016 FINDINGS: The lungs are symmetrically expanded. No  focal pulmonary infiltrate identified. There is a nodular opacity within the right suprahilar region which may represent vascular shadow or a focal pulmonary nodule, but is not we well characterized ll on this examination. This does, however, appear more prominent than on prior examination. No pneumothorax or pleural effusion. Cardiac size is within  normal limits. No acute bone abnormality. IMPRESSION: No radiographic evidence of acute cardiopulmonary disease. Nodular opacity within the right suprahilar region, indeterminate. Standard two view chest radiograph may be helpful in excluding and artifactual opacity. Electronically Signed   By: Fidela Salisbury M.D.   On: 10/28/2021 19:07   ECHOCARDIOGRAM COMPLETE  Result Date: 10/29/2021    ECHOCARDIOGRAM REPORT   Patient Name:   PEACE JOST Date of Exam: 10/29/2021 Medical Rec #:  [id number]          Height:       62.0 in Accession #:    [id number]         Weight:       163.0 lb Date of Birth:  [birth year]          BSA:          1.753 m Patient Age:    73 years           BP:           108/61 mmHg Patient Gender: M                  HR:           117 bpm. Exam Location:  Inpatient Procedure: 2D Echo, Cardiac Doppler and Color Doppler Indications:    Afib  History:        Patient has prior history of Echocardiogram examinations, most                 recent 12/20/2019. Stroke; Risk Factors:Diabetes and                 Hypertension.  Sonographer:    Jyl Heinz Referring Phys: [id number] [place] T TU IMPRESSIONS  1. Left ventricular ejection fraction, by estimation, is 50 to 55%. Left ventricular ejection fraction by 2D MOD biplane is 52.1 %. The left ventricle has low normal function. The left ventricle has no regional wall motion abnormalities. There is moderate left ventricular hypertrophy. Left ventricular diastolic function could not be evaluated.  2. Right ventricular systolic function is low normal. The right ventricular size is normal. There is normal pulmonary  artery systolic pressure. The estimated right ventricular systolic pressure is 16.9 mmHg.  3. The mitral valve is grossly normal. No evidence of mitral valve regurgitation.  4. The aortic valve is tricuspid. Aortic valve regurgitation is not visualized.  5. The inferior vena cava is normal in size with greater than 50% respiratory variability, suggesting right atrial pressure of 3 mmHg. Comparison(s): Changes from prior study are noted. 12/20/2019: LVEF 55-60%. FINDINGS  Left Ventricle: Left ventricular ejection fraction, by estimation, is 50 to 55%. Left ventricular ejection fraction by 2D MOD biplane is 52.1 %. The left ventricle has low normal function. The left ventricle has no regional wall motion abnormalities. The left ventricular internal cavity size was normal in size. There is moderate left ventricular hypertrophy. Left ventricular diastolic function could not be evaluated due to atrial fibrillation. Left ventricular diastolic function could not be evaluated. Right Ventricle: The right ventricular size is normal. No increase in right ventricular wall thickness. Right ventricular systolic function is low normal. There is normal pulmonary artery systolic pressure. The tricuspid regurgitant velocity is 2.66 m/s,  and with an assumed right atrial pressure of 3 mmHg, the estimated right ventricular systolic pressure is 67.8 mmHg. Left Atrium: Left atrial size was normal in size. Right Atrium: Right atrial size was normal in size. Pericardium: There is no evidence of pericardial effusion.  Mitral Valve: The mitral valve is grossly normal. No evidence of mitral valve regurgitation. Tricuspid Valve: The tricuspid valve is grossly normal. Tricuspid valve regurgitation is trivial. Aortic Valve: The aortic valve is tricuspid. Aortic valve regurgitation is not visualized. Aortic valve peak gradient measures 7.6 mmHg. Pulmonic Valve: The pulmonic valve was normal in structure. Pulmonic valve regurgitation is not  visualized. Aorta: The aortic root and ascending aorta are structurally normal, with no evidence of dilitation. Venous: The inferior vena cava is normal in size with greater than 50% respiratory variability, suggesting right atrial pressure of 3 mmHg. IAS/Shunts: No atrial level shunt detected by color flow Doppler.  LEFT VENTRICLE PLAX 2D                        Biplane EF (MOD) LVIDd:         4.50 cm         LV Biplane EF:   Left LVIDs:         3.80 cm                          ventricular LV PW:         1.30 cm                          ejection LV IVS:        1.30 cm                          fraction by LVOT diam:     2.00 cm                          2D MOD LV SV:         46                               biplane is LV SV Index:   26                               52.1 %. LVOT Area:     3.14 cm                                Diastology                                LV e' medial:    10.40 cm/s LV Volumes (MOD)               LV E/e' medial:  12.6 LV vol d, MOD    83.8 ml       LV e' lateral:   9.25 cm/s A2C:                           LV E/e' lateral: 14.2 LV vol d, MOD    107.0 ml A4C: LV vol s, MOD    39.1 ml A2C: LV vol s, MOD    49.3 ml A4C: LV SV MOD A2C:   44.7 ml LV SV MOD A4C:   107.0 ml LV SV MOD BP:  50.2 ml RIGHT VENTRICLE             IVC RV Basal diam:  2.40 cm     IVC diam: 1.80 cm RV Mid diam:    1.90 cm RV S prime:     10.00 cm/s TAPSE (M-mode): 1.7 cm LEFT ATRIUM             Index        RIGHT ATRIUM           Index LA diam:        2.90 cm 1.65 cm/m   RA Area:     11.50 cm LA Vol (A2C):   25.8 ml 14.72 ml/m  RA Volume:   20.80 ml  11.87 ml/m LA Vol (A4C):   44.8 ml 25.56 ml/m LA Biplane Vol: 35.6 ml 20.31 ml/m  AORTIC VALVE AV Area (Vmax): 2.23 cm AV Vmax:        138.00 cm/s AV Peak Grad:   7.6 mmHg LVOT Vmax:      97.90 cm/s LVOT Vmean:     77.500 cm/s LVOT VTI:       0.146 m  AORTA Ao Root diam: 3.70 cm Ao Asc diam:  3.40 cm MITRAL VALVE                TRICUSPID VALVE MV Area (PHT): 7.66 cm      TR Peak grad:   28.3 mmHg MV Decel Time: 99 msec      TR Vmax:        266.00 cm/s MV E velocity: 131.00 cm/s MV A velocity: 44.10 cm/s   SHUNTS MV E/A ratio:  2.97         Systemic VTI:  0.15 m                             Systemic Diam: 2.00 cm Lyman Bishop MD Electronically signed by Lyman Bishop MD Signature Date/Time: 10/29/2021/11:08:47 AM    Final         Scheduled Meds:  sodium chloride   Intravenous Once   allopurinol  150 mg Oral Daily   colchicine  0.3 mg Oral Daily   insulin aspart  0-6 Units Subcutaneous TID WC   pantoprazole (PROTONIX) IV  40 mg Intravenous Q12H   sodium zirconium cyclosilicate  10 g Oral Once   vancomycin variable dose per unstable renal function (pharmacist dosing)   Does not apply See admin instructions   Continuous Infusions:  sodium chloride Stopped (10/28/21 1945)   ceFEPime (MAXIPIME) IV 2 g (10/30/21 1243)   dextrose 5% lactated ringers 100 mL/hr at 10/30/21 0931   vancomycin 1,000 mg (10/30/21 1248)     LOS: 2 days    Time spent: 35 minutes.     Elmarie Shiley, MD [facility]   If 7PM-7AM, please contact night-coverage [email or web address]  10/30/2021, 3:41 PM

## 2021-10-30 NOTE — Evaluation (Signed)
Physical Therapy Evaluation Patient Details Name: Eugene Cooper MRN: [id number] DOB: 03-30-1941 Today's Date: 10/30/2021  History of Present Illness  80 yo admitted 11/20 with AMS, weakness, tachycardia, hyperkalemia and symptomatic anemia. EGD 11/21. PMhx: DM, CVA, HTN, HLD, LBBB, gout  Clinical Impression  Pt with flat affect, limited interaction with son present throughout session and use of interpreter. Pt oriented to self only with tremor reporting cold with heat turned up and blankets provided. Pt with history of gout and bil knee pain which son reports he did have an injection grossly a month ago. Pt normally lives with family and is able to walk with limited assist for ADLs. Pt currently is max assist for mobility, unable to stand and with decreased cognition. Pt will benefit from acute therapy to maximize mobility, safety and function to decrease burden of care.   SpO2 85-93% on RA with cues for breathing technique without consistent pleth HR 99-114       Recommendations for follow up therapy are one component of a multi-disciplinary discharge planning process, led by the attending physician.  Recommendations may be updated based on patient status, additional functional criteria and insurance authorization.  Follow Up Recommendations Skilled nursing-short term rehab (<3 hours/day)    Assistance Recommended at Discharge Frequent or constant Supervision/Assistance  Functional Status Assessment Patient has had a recent decline in their functional status and demonstrates the ability to make significant improvements in function in a reasonable and predictable amount of time.  Equipment Recommendations  None recommended by PT    Recommendations for Other Services OT consult     Precautions / Restrictions Precautions Precautions: Fall Precaution Comments: bil knee pain, gout      Mobility  Bed Mobility Overal bed mobility: Needs Assistance Bed Mobility: Supine to Sit;Sit to  Supine     Supine to sit: Mod assist Sit to supine: Max assist   General bed mobility comments: mod assist with multimodal cues, increased time and effort to achieve sitting. Min assist for sitting balance. max assist to return to supine to lift legs to surface. Total +2 to slide toward [facility]    Transfers Overall transfer level: Needs assistance   Transfers: Sit to/from Stand Sit to Stand: Total assist           General transfer comment: pt maintaining bil knee flexion x 3 attempts to stand with knees blocked use of belt and pad. Pt not providing bil LE physical assist to rise. Attempt to perform seated scoot EOB with total assist with pad    Ambulation/Gait               General Gait Details: not yet able  Stairs            Wheelchair Mobility    Modified Rankin (Stroke Patients Only)       Balance Overall balance assessment: Needs assistance;History of Falls Sitting-balance support: Feet supported;Bilateral upper extremity supported Sitting balance-Leahy Scale: Poor Sitting balance - Comments: min assist for sitting balance with right lean Postural control: Right lateral lean                                   Pertinent Vitals/Pain Pain Assessment: Faces Pain Score: 4  Pain Location: bil knees with attempts to stand Pain Descriptors / Indicators: Aching;Guarding;Grimacing Pain Intervention(s): Limited activity within patient's tolerance;Monitored during session;RN gave pain meds during session;Repositioned    Home Living Family/patient expects to  be discharged to:: Private residence Living Arrangements: Children;Other relatives Available Help at Discharge: Family;Available 24 hours/day Type of Home: House Home Access: Stairs to enter   [employer] of Steps: 1   Home Layout: One level Home Equipment: Conservation officer, nature (2 wheels);Cane - single point;Shower seat;Wheelchair - manual;Hand held shower head      Prior Function  Prior Level of Function : Needs assist       Physical Assist : Mobility (physical);ADLs (physical) Mobility (physical): Stairs   Mobility Comments: pt would grasp door frame and have assist up step into home. Normally can walk with RW or cane but decline over last week leading to admission unable to stand ADLs Comments: family would assist with setup for shower and supervise to enter shower then pt bathed on his own. family did all the homemaking     Hand Dominance        Extremity/Trunk Assessment   Upper Extremity Assessment Upper Extremity Assessment: Generalized weakness    Lower Extremity Assessment Lower Extremity Assessment: Generalized weakness    Cervical / Trunk Assessment Cervical / Trunk Assessment: Other exceptions Cervical / Trunk Exceptions: rounded shoulders with right lean in sitting  Communication   Communication: Prefers language other than [place];Interpreter utilized ([employer] ipad interpreter throughout [place])  Cognition Arousal/Alertness: Awake/alert Behavior During Therapy: Flat affect Overall Cognitive Status: Impaired/Different from baseline Area of Impairment: Orientation;Attention;Memory;Following commands;Safety/judgement;Problem solving                 Orientation Level: Disoriented to;Time;Situation;Place Current Attention Level: Sustained Memory: Decreased short-term memory Following Commands: Follows one step commands inconsistently;Follows one step commands with increased time Safety/Judgement: Decreased awareness of safety;Decreased awareness of deficits   Problem Solving: Slow processing;Decreased initiation;Requires verbal cues;Requires tactile cues General Comments: pt not oriented to son's name in room, date or situation. Pt with difficulty maintaining attention and lack of awareness of current deficits and need for assist.        General Comments      Exercises     Assessment/Plan    PT Assessment Patient  needs continued PT services  PT Problem List Decreased strength;Decreased mobility;Decreased safety awareness;Decreased coordination;Decreased activity tolerance;Decreased cognition;Decreased balance;Decreased knowledge of use of DME;Cardiopulmonary status limiting activity;Pain       PT Treatment Interventions DME instruction;Therapeutic exercise;Functional mobility training;Therapeutic activities;Patient/family education;Stair training;Cognitive remediation;Balance training;Gait training    PT Goals (Current goals can be found in the Care Plan section)  Acute Rehab PT Goals Patient Stated Goal: be able to walk and return home PT Goal Formulation: With patient/family Time For Goal Achievement: 11/13/21 Potential to Achieve Goals: Fair    Frequency Min 3X/week   Barriers to discharge Decreased caregiver support      Co-evaluation               AM-PAC PT "6 Clicks" Mobility  Outcome Measure Help needed turning from your back to your side while in a flat bed without using bedrails?: A Lot Help needed moving from lying on your back to sitting on the side of a flat bed without using bedrails?: Total Help needed moving to and from a bed to a chair (including a wheelchair)?: Total Help needed standing up from a chair using your arms (e.g., wheelchair or bedside chair)?: Total Help needed to walk in hospital room?: Total Help needed climbing 3-5 steps with a railing? : Total 6 Click Score: 7    End of Session Equipment Utilized During Treatment: Gait belt Activity Tolerance: Patient limited by fatigue  Patient left: in bed;with call bell/phone within reach;with family/visitor present;with bed alarm set Nurse Communication: Mobility status PT Visit Diagnosis: Other abnormalities of gait and mobility (R26.89);Difficulty in walking, not elsewhere classified (R26.2);Muscle weakness (generalized) (M62.81);History of falling (Z91.81)    Time: 8115-7262 PT Time Calculation (min)  (ACUTE ONLY): 33 min   Charges:   PT Evaluation $PT Eval Moderate Complexity: 1 Mod PT Treatments $Therapeutic Activity: 8-22 mins        Joli Koob P, PT Acute Rehabilitation Services Pager: [phone] Office: [place] Tameya Kuznia 10/30/2021, 10:04 AM

## 2021-10-30 NOTE — Care Plan (Signed)
Incontinent urine and confused, pt had great difficulty following instruction to take two tylenol pills and needed repeated guidance but swallow was good. Family member pt son at bedside

## 2021-10-30 NOTE — Progress Notes (Signed)
Daily Rounding Note  10/30/2021, 11:44 AM  LOS: 2 days   SUBJECTIVE:   Chief complaint:   Anemia, FOBT +, no overt bleeding.  gastritis  No BM's per RNs.  Tolerating clears but not very hungry.  No nausea, no pain.  Family says he is more alert but still confused  OBJECTIVE:         Vital signs in last 24 hours:    Temp:  [98 F (36.7 C)-101.4 F (38.6 C)] 98.5 F (36.9 C) (11/22 0700) Pulse Rate:  [83-130] 93 (11/22 0700) Resp:  [14-27] 18 (11/22 0700) BP: (73-127)/(46-86) 103/64 (11/22 0700) SpO2:  [90 %-100 %] 93 % (11/22 0957) Weight:  [63.6 kg] 63.6 kg (11/21 1345) Last BM Date: 10/28/21 Filed Weights   10/29/21 1345  Weight: 63.6 kg   General: looks unwell, comfortable.  Lethargic but arouseable   Heart: RRR Chest: scant rales in L base.  No dyspnea or cough Abdomen: soft, ND.  Active BS.  Mild LUQ tenderness  Extremities: no CCE Neuro/Psych:  answers questions appropriately, no tremors.  Moves all  4 limbs.  Unable to say the year or place.    Intake/Output from previous day: 11/21 0701 - 11/22 0700 In: -  Out: 1100 [Urine:1100]  Intake/Output this shift: Total I/O In: -  Out: 250 [Urine:250]  Lab Results: Recent Labs    10/29/21 1559 10/30/21 0117 10/30/21 0743  WBC 9.1 5.7 6.3  HGB 8.6* 7.6* 7.8*  HCT 25.7* 21.8* 23.3*  PLT 73* 60* 53*   BMET Recent Labs    10/28/21 2039 10/29/21 0521 10/30/21 0117  NA 136 137 135  K 5.0 5.1 4.1  CL 105 107 105  CO2 21* 22 21*  GLUCOSE 86 79 203*  BUN 58* 51* 36*  CREATININE 2.68* 2.05* 1.50*  CALCIUM 8.5* 8.6* 8.2*   LFT Recent Labs    10/28/21 1808  PROT 5.8*  ALBUMIN 2.8*  AST 26  ALT 23  ALKPHOS 78  BILITOT 1.0   PT/INR Recent Labs    10/28/21 1808  LABPROT 15.7*  INR 1.3*   Hepatitis Panel No results for input(s): HEPBSAG, HCVAB, HEPAIGM, HEPBIGM in the last 72 hours.  Studies/Results: DG Chest 2 View  Result Date:  10/30/2021 CLINICAL DATA:  Fever EXAM: CHEST - 2 VIEW COMPARISON:  10/28/2021 FINDINGS: Transverse diameter of heart is increased. There are no signs of pulmonary edema or focal pulmonary consolidation. In the immediate previous study there was a possible nodular density described in the medial right upper lung fields. In the current study, there is a cardiac monitoring lead superimposed over the region of possible nodule seen in the previous examination limiting evaluation for a nodule. There is no pleural effusion or pneumothorax. IMPRESSION: Cardiomegaly. There are no signs of pulmonary edema or focal pulmonary consolidation. In the previous study there was a possible nodular density in the medial right upper lung fields which is partly obscured by cardiac monitoring lead limiting comparison. Short-term follow-up chest radiographs and CT if warranted should be considered. Electronically Signed   By: Elmer Picker M.D.   On: 10/30/2021 08:38   CT HEAD WO CONTRAST (5MM)  Result Date: 10/28/2021 CLINICAL DATA:  Patient states he cannot: Self up since yesterday. EXAM: CT HEAD WITHOUT CONTRAST CT CERVICAL SPINE WITHOUT CONTRAST TECHNIQUE: Multidetector CT imaging of the head and cervical spine was performed following the standard protocol without intravenous contrast. Multiplanar CT image reconstructions of  the cervical spine were also generated. COMPARISON:  CT scan of the brain December 19, 2019. FINDINGS: CT HEAD FINDINGS Brain: Chronic white matter changes are stable. No evidence of acute infarction, hemorrhage, hydrocephalus, extra-axial collection or mass lesion/mass effect. Vascular: Calcified atherosclerosis in the intracranial carotids. Skull: Normal. Negative for fracture or focal lesion. Sinuses/Orbits: No acute finding. Other: None. CT CERVICAL SPINE FINDINGS Alignment: Normal. Skull base and vertebrae: No acute fracture. No primary bone lesion or focal pathologic process. Soft tissues and  spinal canal: No prevertebral fluid or swelling. No visible canal hematoma. Disc levels:  Mild multilevel degenerative disc disease. Upper chest: Negative. Other: No other abnormalities. IMPRESSION: 1. No acute intracranial abnormalities. 2. No fracture or traumatic malalignment in the cervical spine. Electronically Signed   By: Dorise Bullion III M.D.   On: 10/28/2021 19:49   CT Cervical Spine Wo Contrast  Result Date: 10/28/2021 CLINICAL DATA:  Patient states he cannot: Self up since yesterday. EXAM: CT HEAD WITHOUT CONTRAST CT CERVICAL SPINE WITHOUT CONTRAST TECHNIQUE: Multidetector CT imaging of the head and cervical spine was performed following the standard protocol without intravenous contrast. Multiplanar CT image reconstructions of the cervical spine were also generated. COMPARISON:  CT scan of the brain December 19, 2019. FINDINGS: CT HEAD FINDINGS Brain: Chronic white matter changes are stable. No evidence of acute infarction, hemorrhage, hydrocephalus, extra-axial collection or mass lesion/mass effect. Vascular: Calcified atherosclerosis in the intracranial carotids. Skull: Normal. Negative for fracture or focal lesion. Sinuses/Orbits: No acute finding. Other: None. CT CERVICAL SPINE FINDINGS Alignment: Normal. Skull base and vertebrae: No acute fracture. No primary bone lesion or focal pathologic process. Soft tissues and spinal canal: No prevertebral fluid or swelling. No visible canal hematoma. Disc levels:  Mild multilevel degenerative disc disease. Upper chest: Negative. Other: No other abnormalities. IMPRESSION: 1. No acute intracranial abnormalities. 2. No fracture or traumatic malalignment in the cervical spine. Electronically Signed   By: Dorise Bullion III M.D.   On: 10/28/2021 19:49   DG Chest Port 1 View  Result Date: 10/28/2021 CLINICAL DATA:  Sepsis EXAM: PORTABLE CHEST 1 VIEW COMPARISON:  04/25/2016 FINDINGS: The lungs are symmetrically expanded. No focal pulmonary infiltrate  identified. There is a nodular opacity within the right suprahilar region which may represent vascular shadow or a focal pulmonary nodule, but is not we well characterized ll on this examination. This does, however, appear more prominent than on prior examination. No pneumothorax or pleural effusion. Cardiac size is within normal limits. No acute bone abnormality. IMPRESSION: No radiographic evidence of acute cardiopulmonary disease. Nodular opacity within the right suprahilar region, indeterminate. Standard two view chest radiograph may be helpful in excluding and artifactual opacity. Electronically Signed   By: Fidela Salisbury M.D.   On: 10/28/2021 19:07   ECHOCARDIOGRAM COMPLETE  Result Date: 10/29/2021    ECHOCARDIOGRAM REPORT   Patient Name:   Eugene Cooper Date of Exam: 10/29/2021 Medical Rec #:  [id number]          Height:       62.0 in Accession #:    [id number]         Weight:       163.0 lb Date of Birth:  03/17/1941          BSA:          1.753 m Patient Age:    80 years           BP:           108/61  mmHg Patient Gender: M                  HR:           117 bpm. Exam Location:  Inpatient Procedure: 2D Echo, Cardiac Doppler and Color Doppler Indications:    Afib  History:        Patient has prior history of Echocardiogram examinations, most                 recent 12/20/2019. Stroke; Risk Factors:Diabetes and                 Hypertension.  Sonographer:    Jyl Heinz Referring Phys: [id number] [place] T TU IMPRESSIONS  1. Left ventricular ejection fraction, by estimation, is 50 to 55%. Left ventricular ejection fraction by 2D MOD biplane is 52.1 %. The left ventricle has low normal function. The left ventricle has no regional wall motion abnormalities. There is moderate left ventricular hypertrophy. Left ventricular diastolic function could not be evaluated.  2. Right ventricular systolic function is low normal. The right ventricular size is normal. There is normal pulmonary artery systolic pressure.  The estimated right ventricular systolic pressure is 33.8 mmHg.  3. The mitral valve is grossly normal. No evidence of mitral valve regurgitation.  4. The aortic valve is tricuspid. Aortic valve regurgitation is not visualized.  5. The inferior vena cava is normal in size with greater than 50% respiratory variability, suggesting right atrial pressure of 3 mmHg. Comparison(s): Changes from prior study are noted. 12/20/2019: LVEF 55-60%. FINDINGS  Left Ventricle: Left ventricular ejection fraction, by estimation, is 50 to 55%. Left ventricular ejection fraction by 2D MOD biplane is 52.1 %. The left ventricle has low normal function. The left ventricle has no regional wall motion abnormalities. The left ventricular internal cavity size was normal in size. There is moderate left ventricular hypertrophy. Left ventricular diastolic function could not be evaluated due to atrial fibrillation. Left ventricular diastolic function could not be evaluated. Right Ventricle: The right ventricular size is normal. No increase in right ventricular wall thickness. Right ventricular systolic function is low normal. There is normal pulmonary artery systolic pressure. The tricuspid regurgitant velocity is 2.66 m/s,  and with an assumed right atrial pressure of 3 mmHg, the estimated right ventricular systolic pressure is 25.0 mmHg. Left Atrium: Left atrial size was normal in size. Right Atrium: Right atrial size was normal in size. Pericardium: There is no evidence of pericardial effusion. Mitral Valve: The mitral valve is grossly normal. No evidence of mitral valve regurgitation. Tricuspid Valve: The tricuspid valve is grossly normal. Tricuspid valve regurgitation is trivial. Aortic Valve: The aortic valve is tricuspid. Aortic valve regurgitation is not visualized. Aortic valve peak gradient measures 7.6 mmHg. Pulmonic Valve: The pulmonic valve was normal in structure. Pulmonic valve regurgitation is not visualized. Aorta: The aortic root  and ascending aorta are structurally normal, with no evidence of dilitation. Venous: The inferior vena cava is normal in size with greater than 50% respiratory variability, suggesting right atrial pressure of 3 mmHg. IAS/Shunts: No atrial level shunt detected by color flow Doppler.  LEFT VENTRICLE PLAX 2D                        Biplane EF (MOD) LVIDd:         4.50 cm         LV Biplane EF:   Left LVIDs:  3.80 cm                          ventricular LV PW:         1.30 cm                          ejection LV IVS:        1.30 cm                          fraction by LVOT diam:     2.00 cm                          2D MOD LV SV:         46                               biplane is LV SV Index:   26                               52.1 %. LVOT Area:     3.14 cm                                Diastology                                LV e' medial:    10.40 cm/s LV Volumes (MOD)               LV E/e' medial:  12.6 LV vol d, MOD    83.8 ml       LV e' lateral:   9.25 cm/s A2C:                           LV E/e' lateral: 14.2 LV vol d, MOD    107.0 ml A4C: LV vol s, MOD    39.1 ml A2C: LV vol s, MOD    49.3 ml A4C: LV SV MOD A2C:   44.7 ml LV SV MOD A4C:   107.0 ml LV SV MOD BP:    50.2 ml RIGHT VENTRICLE             IVC RV Basal diam:  2.40 cm     IVC diam: 1.80 cm RV Mid diam:    1.90 cm RV S prime:     10.00 cm/s TAPSE (M-mode): 1.7 cm LEFT ATRIUM             Index        RIGHT ATRIUM           Index LA diam:        2.90 cm 1.65 cm/m   RA Area:     11.50 cm LA Vol (A2C):   25.8 ml 14.72 ml/m  RA Volume:   20.80 ml  11.87 ml/m LA Vol (A4C):   44.8 ml 25.56 ml/m LA Biplane Vol: 35.6 ml 20.31 ml/m  AORTIC VALVE AV Area (Vmax): 2.23 cm AV Vmax:        138.00 cm/s AV Peak Grad:   7.6 mmHg LVOT Vmax:  97.90 cm/s LVOT Vmean:     77.500 cm/s LVOT VTI:       0.146 m  AORTA Ao Root diam: 3.70 cm Ao Asc diam:  3.40 cm MITRAL VALVE                TRICUSPID VALVE MV Area (PHT): 7.66 cm     TR Peak grad:   28.3 mmHg MV  Decel Time: 99 msec      TR Vmax:        266.00 cm/s MV E velocity: 131.00 cm/s MV A velocity: 44.10 cm/s   SHUNTS MV E/A ratio:  2.97         Systemic VTI:  0.15 m                             Systemic Diam: 2.00 cm Lyman Bishop MD Electronically signed by Lyman Bishop MD Signature Date/Time: 10/29/2021/11:08:47 AM    Final     Scheduled Meds:  allopurinol  150 mg Oral Daily   colchicine  0.3 mg Oral Daily   insulin aspart  0-6 Units Subcutaneous TID WC   pantoprazole (PROTONIX) IV  40 mg Intravenous Q12H   sodium zirconium cyclosilicate  10 g Oral Once   vancomycin variable dose per unstable renal function (pharmacist dosing)   Does not apply See admin instructions   Continuous Infusions:  sodium chloride Stopped (10/28/21 1945)   ceFEPime (MAXIPIME) IV     dextrose 5% lactated ringers 100 mL/hr at 10/30/21 0931   vancomycin     PRN Meds:.acetaminophen **OR** acetaminophen, metoprolol tartrate   ASSESMENT:     Anemia, FOBT +.  10/29/2021 EGD with erosive gastritis, no active bleeding or stigmata of recent bleeding.  Esophagus and examined duodenum normal.  Gastric biopsies pending.  No stigmata of cirrhosis/liver disease.   Hgb 6.4 >> 8.6 >> 7.8.  Day 2 Protonix 40 IV bid.       Thrombocytopenia.  Platelets 101 ... 53  FTT, mental status decline in recent days.  Head CT non acute, stable white matter disease.      Fatty liver per CT 09/2020.      AKI.   Improving.  GFR 17 ... 47.      Resolved hyponatremia.      LVEF 50 to 55% by echo.      Sinus tachycardia.      Fever, SIRS criteria.  Blood clx negative todate.  U/A negative.  No infection on CXR.  Empiric Maxipime, vanco in place.     PLAN     Check ammonia level.  ? Ultrasound abdomen to re-assess liver? (Last done 09/2020).      Azucena Freed  10/30/2021, 11:44 AM Phone [phone]

## 2021-10-30 NOTE — Progress Notes (Deleted)
Rapid called due to low bp 73/46 , text also to hospital MD no reply . Iv ns bolus 500cc ordered

## 2021-10-30 NOTE — Progress Notes (Signed)
Pharmacy Antibiotic Note  Eugene Cooper is a 80 y.o. male admitted on 10/28/2021 with sepsis.  Pharmacy has been consulted for cefepime and vancomycin dosing.  Patient with a history of DM 2, HTN, CVA 2021, HLD. Patient presenting with AMS.  Patient's serum creatinine is down to 1.5 (from 3.54 on admit), baseline looks to be closer to 1. WBC wnl; LA 6.8>>1.2; Tmax 101.4. Rapid response called overnight due to tachycaria, hypotension, and increased lethargy. Patient demonstrated some hemodynamic improvement s/p IVF bolus.  Plan: Increase Cefepime 2g IV to q12h Vancomycin 1000 mg IV q36h Trend WBC, fever, renal function, and clinical course Follow-up cultures and de-escalate antibiotics as appropriate.   Height: 5\' 2"  (157.5 cm) Weight: 63.6 kg (140 lb 3.4 oz) IBW/kg (Calculated) : 54.6  Temp (24hrs), Avg:99.6 F (37.6 C), Min:98 F (36.7 C), Max:101.4 F (38.6 C)  Recent Labs  Lab 10/28/21 1808 10/28/21 2025 10/28/21 2039 10/29/21 0521 10/29/21 1559 10/30/21 0117 10/30/21 0743  WBC 7.1  --   --  6.8 9.1 5.7 6.3  CREATININE 3.54*  --  2.68* 2.05*  --  1.50*  --   LATICACIDVEN 6.8* 4.4*  --   --   --  1.2  --      Estimated Creatinine Clearance: 30.3 mL/min (A) (by C-G formula based on SCr of 1.5 mg/dL (H)).    No Known Allergies  Antimicrobials this admission: cefepime 11/20 >>  vancomycin 11/20 >>   Microbiology results: 11/20 BCx: ng x 2d 11/20 UCx: ngf 11/21 MRSA PCR - negative 11/20 COV/flu negative    Thank you for allowing [place] to participate in this patients care. Jens Som, PharmD 10/30/2021 10:21 AM  **Pharmacist phone directory can be found on [email or web address] listed under [place]**

## 2021-10-30 NOTE — Evaluation (Signed)
Occupational Therapy Evaluation Patient Details Name: Eugene Cooper MRN: [id number] DOB: 07/13/1941 Today's Date: 10/30/2021   History of Present Illness 80 yo admitted 11/20 with AMS, weakness, tachycardia, hyperkalemia and symptomatic anemia. EGD 11/21. PMhx: DM, CVA, HTN, HLD, LBBB, gout   Clinical Impression   PTA patient was living with his son (alternates living with his son and daughter every few months). Per son present at bedside patient was requiring some assist for with ADLs for safety. Son also reports use of RW for household mobility. Patient currently functioning below baseline demonstrating observed ADLs with Max A to Total A. Patient also requiring Min to Max A for bed mobility and +2 assist for sit to stand transfers. Patient also limited by deficits listed below including pain, generalized weakness/debility, decreased balance and decreased cognition and would benefit from continued acute OT services in prep for safe d/c to next level of care. Son in agreement with recommendation for SNF rehab prior to return home.      Recommendations for follow up therapy are one component of a multi-disciplinary discharge planning process, led by the attending physician.  Recommendations may be updated based on patient status, additional functional criteria and insurance authorization.   Follow Up Recommendations  Skilled nursing-short term rehab (<3 hours/day)    Assistance Recommended at Discharge Frequent or constant Supervision/Assistance  Functional Status Assessment  Patient has had a recent decline in their functional status and demonstrates the ability to make significant improvements in function in a reasonable and predictable amount of time.  Equipment Recommendations  Other (comment) (Defer to next level of care.)    Recommendations for Other Services       Precautions / Restrictions Precautions Precautions: Fall Precaution Comments: bil knee pain,  gout Restrictions Weight Bearing Restrictions: No      Mobility Bed Mobility Overal bed mobility: Needs Assistance Bed Mobility: Supine to Sit;Sit to Supine;Rolling Rolling: Min assist   Supine to sit: Mod assist Sit to supine: Max assist   General bed mobility comments: Min A for rolling in supine. Mod A at BLE and trunk with HOB elevated to come to sitting. Max A at BLE and trunk (for controlled descent) during return to supine.    Transfers Overall transfer level: Needs assistance Equipment used: 2 person hand held assist Transfers: Sit to/from Stand Sit to Stand: Max assist;+2 physical assistance;+2 safety/equipment           General transfer comment: Max A +2 for sit to stand from EOB. Cues for upright posture. Patient c/o pain "all over" with quick return to sitting at EOB. Unable to tolerate standing for more than 10 seconds.      Balance Overall balance assessment: Needs assistance;History of Falls Sitting-balance support: Feet supported;Bilateral upper extremity supported Sitting balance-Leahy Scale: Poor Sitting balance - Comments: min assist for sitting balance with right lean Postural control: Right lateral lean                                 ADL either performed or assessed with clinical judgement   ADL Overall ADL's : Needs assistance/impaired Eating/Feeding: Maximal assistance   Grooming: Maximal assistance   Upper Body Bathing: Maximal assistance   Lower Body Bathing: Total assistance;Bed level   Upper Body Dressing : Maximal assistance   Lower Body Dressing: Total assistance;Bed level                 General ADL Comments:  Patient greatly limited by pain "all over" with movement.     Vision Baseline Vision/History: 0 No visual deficits Ability to See in Adequate Light: 0 Adequate Patient Visual Report: No change from baseline (per son present at bedside) Vision Assessment?: No apparent visual deficits     Perception      Praxis      Pertinent Vitals/Pain Pain Assessment: Faces Pain Score: 4  Faces Pain Scale: Hurts even more Pain Location: Bilateral knees with flexion and "all over" with attempts to stand. Pain Descriptors / Indicators: Aching;Guarding;Grimacing Pain Intervention(s): Limited activity within patient's tolerance;Monitored during session;Repositioned     Hand Dominance Right   Extremity/Trunk Assessment Upper Extremity Assessment Upper Extremity Assessment: Generalized weakness (MMT grossly 3+/5. BUE tremors noted. Son present at bedside report tremors are not baseline.)   Lower Extremity Assessment Lower Extremity Assessment: Generalized weakness   Cervical / Trunk Assessment Cervical / Trunk Assessment: Other exceptions Cervical / Trunk Exceptions: rounded shoulders with right lean in sitting   Communication Communication Communication: Prefers language other than [place];Interpreter utilized ([employer] ipad interpreter throughout [place])   Cognition Arousal/Alertness: Awake/alert Behavior During Therapy: Flat affect Overall Cognitive Status: Impaired/Different from baseline Area of Impairment: Orientation;Attention;Memory;Following commands;Safety/judgement;Problem solving                 Orientation Level: Disoriented to;Time;Situation;Place Current Attention Level: Sustained Memory: Decreased short-term memory Following Commands: Follows one step commands inconsistently;Follows one step commands with increased time Safety/Judgement: Decreased awareness of safety;Decreased awareness of deficits   Problem Solving: Slow processing;Decreased initiation;Requires verbal cues;Requires tactile cues General Comments: A&O to person and sitution only. Disoriented to place and time. Follows 1-step verbal commands with 70% accuracy. Requires repeat cues.     General Comments  Upon entry HR 100bpm, SpO2 93% on RA, RR 19 and BP 104/70. With mild activity HR increased  to 120s. Poor pleth with desat to 84% on RA. Quickly returned >90%.    Exercises     Shoulder Instructions      Home Living Family/patient expects to be discharged to:: Private residence Living Arrangements: Children;Other relatives Available Help at Discharge: Family;Available 24 hours/day Type of Home: House Home Access: Stairs to enter [employer] of Steps: 1   Home Layout: One level     Bathroom Shower/Tub: Occupational psychologist: Handicapped height     Home Equipment: Conservation officer, nature (2 wheels);Cane - single point;Shower seat;Wheelchair - manual;Hand held shower head   Additional Comments: Alternates living with son and daughter for the past 10 years. [place]3x [place]. Home set-up above is for sons residence.      Prior Functioning/Environment Prior Level of Function : Needs assist       Physical Assist : Mobility (physical);ADLs (physical) Mobility (physical): Stairs   Mobility Comments: pt would grasp door frame and have assist up step into home. Normally can walk with RW or cane but decline over last week leading to admission unable to stand ADLs Comments: family would assist with setup for shower and supervise to enter shower then pt bathed on his own. family did all the homemaking        OT Problem List: Decreased strength;Decreased activity tolerance;Impaired balance (sitting and/or standing);Decreased cognition;Decreased safety awareness;Decreased knowledge of use of DME or AE;Impaired UE functional use;Pain      OT Treatment/Interventions: Self-care/ADL training;Therapeutic exercise;Energy conservation;DME and/or AE instruction;Therapeutic activities;Cognitive remediation/compensation;Patient/family education;Balance training    OT Goals(Current goals can be found in the  care plan section) Acute Rehab OT Goals Patient Stated Goal: Family in agreement with recommendation for SNF rehab. OT Goal  Formulation: With family Time For Goal Achievement: 11/13/21 Potential to Achieve Goals: Fair ADL Goals Pt Will Perform Eating: with set-up;sitting Pt Will Perform Grooming: with set-up;sitting Pt Will Perform Upper Body Bathing: with supervision;sitting Pt Will Perform Upper Body Dressing: with set-up;sitting Pt Will Transfer to Toilet: with min assist;stand pivot transfer;bedside commode Pt Will Perform Toileting - Clothing Manipulation and hygiene: with min assist;sit to/from stand Pt/caregiver will Perform Home Exercise Program: Increased strength;Both right and left upper extremity;With Supervision Additional ADL Goal #1: Patient will score <4/28 on SBT indicating improved cognition in prep for ADLs.  OT Frequency: Min 2X/week   Barriers to D/C:            Co-evaluation              AM-PAC OT "6 Clicks" Daily Activity     Outcome Measure Help from another person eating meals?: A Lot Help from another person taking care of personal grooming?: A Lot Help from another person toileting, which includes using toliet, bedpan, or urinal?: Total Help from another person bathing (including washing, rinsing, drying)?: Total Help from another person to put on and taking off regular upper body clothing?: A Lot Help from another person to put on and taking off regular lower body clothing?: Total 6 Click Score: 9   End of Session Equipment Utilized During Treatment: Gait belt Nurse Communication: Mobility status  Activity Tolerance: Patient limited by lethargy;Patient limited by pain Patient left: in bed;with call bell/phone within reach;with bed alarm set;with family/visitor present  OT Visit Diagnosis: Unsteadiness on feet (R26.81);Other abnormalities of gait and mobility (R26.89);Muscle weakness (generalized) (M62.81);Pain Pain - part of body:  ("all over")                Time: 1002-1033 OT Time Calculation (min): 31 min Charges:  OT General Charges $OT Visit: 1 Visit OT  Evaluation $OT Eval Moderate Complexity: 1 Mod OT Treatments $Therapeutic Activity: 8-22 mins  Saunders [place] H. OTR/L Supplemental OT, Department of rehab services [phone]  Johsua Shevlin R H. 10/30/2021, 11:44 AM

## 2021-10-30 NOTE — Progress Notes (Signed)
Rapid called due to low bp 73/46 . Iv ns bolus 500cc ordered

## 2021-10-30 NOTE — Progress Notes (Signed)
HOSPITAL MEDICINE OVERNIGHT EVENT NOTE    Notified by nursing that patient has been exhibiting frequent bouts of fever throughout the evening with persisting tachycardia, heart rates in excess of 120 bpm.  Telemetry revealing sinus tachycardia.  At approximately 12:45 AM blood pressure began to decrease to as low as 73/46.  Patient was additionally becoming increasingly lethargic.  Response was called and patient received a 500 cc bolus of normal saline. This resulted and a dramatic improvement in blood pressure to 112/63 after the bolus was complete.  Chart reviewed, patient has been exhibiting multiple SIRS criteria since hospitalization along with concurrent lactic acidosis with an unclear infectious source.  Patient has received broad-spectrum intravenous antibiotics with cefepime and vancomycin which will be continued at this time.  Work-up thus far has been negative with negative blood cultures and urine cultures.  Patient continues to be afebrile however despite receiving 650 mg of Tylenol at 1153PM.  We will administer an additional 325 mg of Tylenol now.  Continue current regimen of antibiotics.  Continue lactated Ringer's infusion.  Continue close clinical monitoring.  Vernelle Emerald  MD [facility]

## 2021-10-30 NOTE — Significant Event (Addendum)
Rapid Response Event Note   Reason for Call : Hypotension in setting of probable sepsis/fever Initial Focused Assessment:  Nursing staff notified me of pt with recent fever (treated with tylenol) and now hypotension 73/46 (54). I instructed to start 500cc NS bolus over the phone while I came to the bedside. Pt is arousable. Skin is hot, dry and pink. Palpable pulses x4. No obvious signs of blood loss. NIBP being checked in lower right arm due to bilateral PIV in upper arms.   0045- 100.61F, HR 114, 73/46, RR 20 with sats 94% on RA. Tylenol given at 2353 for 101.4 F.   0126-BP up to 106/59 (73). Will reassess following completion of bolus.   Interventions:  - NS 500cc IV bolus now   Plan of Care:  -Notify primary MD for further orders -Recommend follow up [place]  MD Notified: Dr. Cyd Silence per primary RN Call Time: 0108 Arrival Time: 0114 End Time: 6270  Madelynn Done, RN

## 2021-10-31 ENCOUNTER — Inpatient Hospital Stay (HOSPITAL_COMMUNITY): Payer: Medicare Other

## 2021-10-31 DIAGNOSIS — G9341 Metabolic encephalopathy: Secondary | ICD-10-CM | POA: Diagnosis not present

## 2021-10-31 DIAGNOSIS — R5381 Other malaise: Secondary | ICD-10-CM

## 2021-10-31 DIAGNOSIS — A419 Sepsis, unspecified organism: Secondary | ICD-10-CM | POA: Diagnosis not present

## 2021-10-31 DIAGNOSIS — J181 Lobar pneumonia, unspecified organism: Secondary | ICD-10-CM

## 2021-10-31 DIAGNOSIS — D62 Acute posthemorrhagic anemia: Secondary | ICD-10-CM | POA: Diagnosis not present

## 2021-10-31 DIAGNOSIS — Z789 Other specified health status: Secondary | ICD-10-CM

## 2021-10-31 DIAGNOSIS — R6520 Severe sepsis without septic shock: Secondary | ICD-10-CM

## 2021-10-31 DIAGNOSIS — K3189 Other diseases of stomach and duodenum: Secondary | ICD-10-CM

## 2021-10-31 DIAGNOSIS — D696 Thrombocytopenia, unspecified: Secondary | ICD-10-CM

## 2021-10-31 DIAGNOSIS — R911 Solitary pulmonary nodule: Secondary | ICD-10-CM

## 2021-10-31 DIAGNOSIS — N179 Acute kidney failure, unspecified: Secondary | ICD-10-CM | POA: Diagnosis not present

## 2021-10-31 LAB — CBC
HCT: 23.7 % — ABNORMAL LOW (ref 39.0–52.0)
Hemoglobin: 8 g/dL — ABNORMAL LOW (ref 13.0–17.0)
MCH: 30.2 pg (ref 26.0–34.0)
MCHC: 33.8 g/dL (ref 30.0–36.0)
MCV: 89.4 fL (ref 80.0–100.0)
Platelets: 44 10*3/uL — ABNORMAL LOW (ref 150–400)
RBC: 2.65 MIL/uL — ABNORMAL LOW (ref 4.22–5.81)
RDW: 16 % — ABNORMAL HIGH (ref 11.5–15.5)
WBC: 4.4 10*3/uL (ref 4.0–10.5)
nRBC: 0 % (ref 0.0–0.2)

## 2021-10-31 LAB — COMPREHENSIVE METABOLIC PANEL
ALT: 18 U/L (ref 0–44)
AST: 16 U/L (ref 15–41)
Albumin: 1.8 g/dL — ABNORMAL LOW (ref 3.5–5.0)
Alkaline Phosphatase: 55 U/L (ref 38–126)
Anion gap: 6 (ref 5–15)
BUN: 23 mg/dL (ref 8–23)
CO2: 21 mmol/L — ABNORMAL LOW (ref 22–32)
Calcium: 8.1 mg/dL — ABNORMAL LOW (ref 8.9–10.3)
Chloride: 108 mmol/L (ref 98–111)
Creatinine, Ser: 1.12 mg/dL (ref 0.61–1.24)
GFR, Estimated: >60 mL/min (ref >60)
Glucose, Bld: 250 mg/dL — ABNORMAL HIGH (ref 70–99)
Potassium: 4 mmol/L (ref 3.5–5.1)
Sodium: 135 mmol/L (ref 135–145)
Total Bilirubin: 1.1 mg/dL (ref 0.3–1.2)
Total Protein: 4.5 g/dL — ABNORMAL LOW (ref 6.5–8.1)

## 2021-10-31 LAB — TYPE AND SCREEN
ABO/RH(D): O POS
Antibody Screen: NEGATIVE
Unit division: 0
Unit division: 0
Unit division: 0

## 2021-10-31 LAB — BPAM RBC
Blood Product Expiration Date: 202211242359
Blood Product Expiration Date: 202212132359
Blood Product Expiration Date: 202212202359
ISSUE DATE / TIME: 202211202048
ISSUE DATE / TIME: 202211210029
ISSUE DATE / TIME: 202211221630
Unit Type and Rh: 5100
Unit Type and Rh: 5100
Unit Type and Rh: 5100

## 2021-10-31 LAB — SURGICAL PATHOLOGY

## 2021-10-31 LAB — PATHOLOGIST SMEAR REVIEW

## 2021-10-31 LAB — GLUCOSE, CAPILLARY
Glucose-Capillary: 176 mg/dL — ABNORMAL HIGH (ref 70–99)
Glucose-Capillary: 134 mg/dL — ABNORMAL HIGH (ref 70–99)
Glucose-Capillary: 110 mg/dL — ABNORMAL HIGH (ref 70–99)
Glucose-Capillary: 160 mg/dL — ABNORMAL HIGH (ref 70–99)

## 2021-10-31 MED ORDER — DOXYCYCLINE HYCLATE 100 MG PO TABS
100.0000 mg | ORAL_TABLET | Freq: Two times a day (BID) | ORAL | Status: DC
  Administered 2021-10-31 – 2021-11-03 (×7): 100 mg via ORAL
  Filled 2021-10-31 (×8): qty 1

## 2021-10-31 MED ORDER — LACTATED RINGERS IV SOLN: INTRAVENOUS | Status: DC

## 2021-10-31 MED ORDER — IOHEXOL 300 MG/ML  SOLN
100.0000 mL | Freq: Once | INTRAMUSCULAR | Status: AC | PRN
  Administered 2021-10-31: 80 mL via INTRAVENOUS

## 2021-10-31 MED ORDER — SODIUM CHLORIDE 0.9 % IV SOLN
3.0000 g | Freq: Four times a day (QID) | INTRAVENOUS | Status: DC
  Administered 2021-10-31 – 2021-11-03 (×13): 3 g via INTRAVENOUS
  Filled 2021-10-31 (×13): qty 8

## 2021-10-31 NOTE — Progress Notes (Signed)
CSW attempted to reach patient's son and granddaughter with no success. Will continue to follow up.  Gilmore Laroche, MSW, [facility]

## 2021-10-31 NOTE — Progress Notes (Signed)
Daily Rounding Note  10/31/2021, 9:54 AM  LOS: 3 days   SUBJECTIVE:   Chief complaint: Anemia.  FOBT positive.  No overt bleeding.  Gastritis.  C/O pain in legs from gout.  No abd pain.  No BM's, no N/V.  Tolerating [place] diet  OBJECTIVE:         Vital signs in last 24 hours:    Temp:  [98.1 F (36.7 C)-101.1 F (38.4 C)] 98.5 F (36.9 C) (11/23 0751) Pulse Rate:  [68-105] 68 (11/23 0751) Resp:  [12-20] 16 (11/23 0751) BP: (100-116)/(61-97) 112/82 (11/23 0751) SpO2:  [91 %-99 %] 96 % (11/23 0751) Last BM Date: 10/28/21 Filed Weights   10/29/21 1345  Weight: 63.6 kg   2 granddaughters were in the room and translated for the patient. General: Looks better but still chronically ill.   Heart: RRR. Chest: Diminished breath sounds throughout.  No labored breathing or cough Abdomen: Soft, nontender, nondistended Extremities: No CCE. Neuro/Psych: Alert.  Appropriate.  Oriented x3.  Intake/Output from previous day: 11/22 0701 - 11/23 0700 In: 321.1 [I.V.:5.8; IV Piggyback:315.2] Out: 1350 [Urine:1350]  Intake/Output this shift: No intake/output data recorded.  Lab Results: Recent Labs    10/30/21 0117 10/30/21 0743 10/31/21 0116  WBC 5.7 6.3 4.4  HGB 7.6* 7.8* 8.0*  HCT 21.8* 23.3* 23.7*  PLT 60* 53* 44*   BMET Recent Labs    10/29/21 0521 10/30/21 0117 10/31/21 0116  NA 137 135 135  K 5.1 4.1 4.0  CL 107 105 108  CO2 22 21* 21*  GLUCOSE 79 203* 250*  BUN 51* 36* 23  CREATININE 2.05* 1.50* 1.12  CALCIUM 8.6* 8.2* 8.1*   LFT Recent Labs    10/28/21 1808 10/31/21 0116  PROT 5.8* 4.5*  ALBUMIN 2.8* 1.8*  AST 26 16  ALT 23 18  ALKPHOS 78 55  BILITOT 1.0 1.1   PT/INR Recent Labs    10/28/21 1808  LABPROT 15.7*  INR 1.3*   Hepatitis Panel No results for input(s): HEPBSAG, HCVAB, HEPAIGM, HEPBIGM in the last 72 hours.  Studies/Results: CT ABDOMEN PELVIS WO CONTRAST  Result Date:  10/30/2021 CLINICAL DATA:  Fever of unknown origin EXAM: CT ABDOMEN AND PELVIS WITHOUT CONTRAST TECHNIQUE: Multidetector CT imaging of the abdomen and pelvis was performed following the standard protocol without IV contrast. COMPARISON:  January 2021 FINDINGS: Lower chest: Partially imaged left lower lobe consolidative opacity. Additional bibasilar atelectasis. Hepatobiliary: No focal liver abnormality is seen. No gallstones, gallbladder wall thickening, or biliary dilatation. Pancreas: Unremarkable. Spleen: Unremarkable. Adrenals/Urinary Tract: Adrenals, kidneys, and bladder are unremarkable. Stomach/Bowel: Stomach is within normal limits. Bowel is normal in caliber. Vascular/Lymphatic: Mild aortic atherosclerosis. No enlarged lymph nodes. Reproductive: Unremarkable. Other: No free fluid.  Abdominal wall is unremarkable. Musculoskeletal: Lumbar spine degenerative changes. No acute osseous abnormality. IMPRESSION: Partially imaged consolidative opacity left lower lobe may reflect atelectasis or pneumonia. No acute abnormality in the abdomen or pelvis. Electronically Signed   By: Macy Mis M.D.   On: 10/30/2021 14:06   DG Chest 2 View  Result Date: 10/30/2021 CLINICAL DATA:  Fever EXAM: CHEST - 2 VIEW COMPARISON:  10/28/2021 FINDINGS: Transverse diameter of heart is increased. There are no signs of pulmonary edema or focal pulmonary consolidation. In the immediate previous study there was a possible nodular density described in the medial right upper lung fields. In the current study, there is a cardiac monitoring lead superimposed over the region of possible  nodule seen in the previous examination limiting evaluation for a nodule. There is no pleural effusion or pneumothorax. IMPRESSION: Cardiomegaly. There are no signs of pulmonary edema or focal pulmonary consolidation. In the previous study there was a possible nodular density in the medial right upper lung fields which is partly obscured by cardiac  monitoring lead limiting comparison. Short-term follow-up chest radiographs and CT if warranted should be considered. Electronically Signed   By: Elmer Picker M.D.   On: 10/30/2021 08:38    Scheduled Meds:  allopurinol  150 mg Oral Daily   colchicine  0.3 mg Oral Daily   insulin aspart  0-6 Units Subcutaneous TID WC   pantoprazole (PROTONIX) IV  40 mg Intravenous Q12H   sodium zirconium cyclosilicate  10 g Oral Once   vancomycin variable dose per unstable renal function (pharmacist dosing)   Does not apply See admin instructions   Continuous Infusions:  sodium chloride Stopped (10/28/21 1945)   ceFEPime (MAXIPIME) IV 2 g (10/30/21 2214)   dextrose 5% lactated ringers 100 mL/hr at 10/30/21 2212   vancomycin 1,000 mg (10/30/21 1248)   PRN Meds:.acetaminophen **OR** acetaminophen, metoprolol tartrate   ASSESMENT:     Anemia, FOBT +.  10/29/2021 EGD with erosive gastritis, no active bleeding or stigmata of recent bleeding.  Esophagus, examined duodenum normal.  Gastric biopsies pending.  No stigmata of cirrhosis/liver disease.   Day 2 Protonix 40 IV bid.        Thrombocytopenia.  Platelets 101 ... 61.  Hospitalist feels this may be reactive in setting of acute sepsis/infection.     [place] anemia.   Hgb 6.4 >> 8.6 >> 8.    FTT, mental status decline in recent days.  Head CT non acute, stable white matter disease.       Fatty liver per CT 09/2020.       AKI.   Improving.  GFR 17 ... to > 60.       LVEF 50 to 55% by echo.       Sinus tachycardia.       Fever, SIRS criteria.  Blood clx negative todate.  U/A negative. L ATX versus pneumonia on CT Abd/pelvis.  Empiric Maxipime, vanco in place.   Indeterminant pulmonary nodule right suprahilar region per CT 11/20.  Follow-up two-view CXR with nodule obscured by cardiac monitor lead.  DM2.   PLAN   Await pending gastric biopsies.  If thrombocytopenia worsens/persists, consider hematology evaluation.  When clinically ready, can  pursue colonoscopy.  Probably this will be in a few days.    Switch to po Protonix 40 mg bid.  Advance to carbohydrate modified diet    Eugene Cooper  10/31/2021, 9:54 AM Phone [phone]

## 2021-10-31 NOTE — Progress Notes (Signed)
PROGRESS NOTE  Eugene Cooper DXI:[id number] DOB: June 08, 1941   PCP: Emelia Loron, NP  Patient is from: Home.  Lives with family.  Uses rolling walker at baseline.  DOA: 10/28/2021 LOS: 3  Chief complaints:  Chief Complaint  Patient presents with   Altered Mental Status     Brief Narrative / Interim history: 80 year old Spanish-speaking male with PMH of CVA, LBBB, DM-2, CKD-3A, HLD and ambulatory dysfunction presenting with altered mental status, labored breathing, poor p.o. intake and bilateral lower extremity pain, and admitted for acute blood loss anemia, sinus tachycardia, AKI, hyperkalemia and severe sepsis due to LLL pneumonia.  Initially concern about A. fib which was ruled out by cardiology.  Hgb 6.4.  He Hemoccult was positive.  EGD revealed gastritis. Cr 3.5.  K6.2.  CXR with right suprahilar nodular opacity.  CTh negative.  CT abdomen and pelvis raises concern for LLL consolidation.  He was transfused 1 unit with appropriate response.  Started on IV vancomycin and cefepime.    Therapy recommends SNF. Subjective: Seen and examined earlier this morning with the help of patient's granddaughters for interpretation.  Patient complains pain in both legs and arms.  He also reports shortness of breath and dry cough.  However, patient is not a great historian.  He is only oriented to self.  Not even able to tell me his granddaughter's name.  Objective: Vitals:   10/31/21 0100 10/31/21 0200 10/31/21 0400 10/31/21 0751  BP: 116/61 100/69 109/62 112/82  Pulse: 83 79 77 68  Resp: 16 16 20 16   Temp:   98.1 F (36.7 C) 98.5 F (36.9 C)  TempSrc:  Oral Oral Oral  SpO2: 93% 95% 96% 96%  Weight:      Height:        Intake/Output Summary (Last 24 hours) at 10/31/2021 1149 Last data filed at 10/31/2021 0900 Gross per 24 hour  Intake 441.07 ml  Output 1100 ml  Net -658.93 ml   Filed Weights   10/29/21 1345  Weight: 63.6 kg    Examination:  GENERAL: No apparent distress.   Nontoxic. HEENT: MMM.  Vision and hearing grossly intact.  NECK: Supple.  No apparent JVD.  RESP: 96% on RA.  No IWOB.  Fair aeration bilaterally. CVS:  RRR. Heart sounds normal.  ABD/GI/GU: BS+. Abd soft, NTND.  MSK/EXT:  Moves extremities. No apparent deformity. No edema.  SKIN: no apparent skin lesion or wound NEURO: Awake.  Oriented only to self.  Follows commands.  No focal neurodeficit other than some BLE weakness, 3+/5 with flexion at hip.  Patellar reflex symmetric.  No facial asymmetry.  Speech is clear. PSYCH: Calm. Normal affect.   Procedures:  11/31-EGD with erosive gastropathy and gastritis but no signs of bleeding.  Microbiology summarized: 11/20-COVID-19 and influenza PCR nonreactive. 11/21-MRSA PCR nonreactive. 11/20-blood culture and urine culture NGTD.  Assessment & Plan: Severe sepsis due to pneumonia: POA.  Heart tachycardia, tachypnea, AKI, AMS and lactic acidosis to 6.8.  Culture data as above.  CT A/P concerning for LLL consolidation.  CXR with right suprahilar nodular opacity.  Pro-Cal elevated. -Discontinue vancomycin and cefepime -Start IV Unasyn and p.o. doxycycline -IS/OOB/PT/OT  ABLA/IDA/GI bleed: Hgb 14.6 in 12/2019.  Hemoccult positive.  EGD with erosive gastropathy and gastritis.  H&H stable after 1 unit. Recent Labs    10/28/21 1808 10/29/21 0521 10/29/21 1559 10/30/21 0117 10/30/21 0743 10/31/21 0116  HGB 6.4* 8.5* 8.6* 7.6* 7.8* 8.0*  -Monitor H&H -GI following.    Hypotension: Likely due  to severe sepsis.  Resolved after IV fluid and IV antibiotics.  TTE with LVEF of 50 to 55% and RVSP of 31 mmHg but no other significant finding. -Continue IV fluid   AKI/azotemia on CKD-3A: Could be prerenal and ATN from diuretics, NSAID and sepsis.  Resolved Recent Labs    10/28/21 1808 10/28/21 2039 10/29/21 0521 10/30/21 0117 10/31/21 0116  BUN 65* 58* 51* 36* 23  CREATININE 3.54* 2.68* 2.05* 1.50* 1.12  -Continue IV fluid -Check  CK -Monitor  Acute metabolic encephalopathy: Suspect this to be due to severe sepsis.  Could have some element of delirium from hospitalization as well.  Family denies history of dementia.  No focal neurodeficit to suggest acute CVA.  CT head without acute finding.  B12 elevated.  No nuchal rigidity to suggest meningitis.  He is awake but only oriented to self. -Treat treatable causes as above -Basic encephalopathy labs -Reorientation and delirium precautions  Uncontrolled DM-2 with hyperglycemia: A1c 7.5%. Recent Labs  Lab 10/30/21 0752 10/30/21 1209 10/30/21 1603 10/30/21 2232 10/31/21 0755  GLUCAP 172* 215* 184* 232* 176*  -Change D5/LR to plain LR. -Continue current insulin regimen   Thrombocytopenia: Likely from sepsis and cephalosporin. Recent Labs  Lab 10/28/21 1808 10/29/21 0521 10/29/21 1559 10/30/21 0117 10/30/21 0743 10/31/21 0116  PLT 101* 69* 73* 60* 53* 44*  -Continue monitoring -Management as above  Possible right suprahilar nodular opacity  -Check CT chest     Hyperkalemia: Likely due to AKI.  Resolved.   History of CVA: Stable. -Continue home aspirin and started   Gout: Uric acid 5.0.  Started on colchicine and prednisone.  Language barrier affecting communication -Patient's granddaughter help with interpretation.  Body mass index is 25.65 kg/m. Consult dietitian       DVT prophylaxis:  SCDs Start: 10/28/21 2054 due to thrombocytopenia  Code Status: Full code Family Communication: Updated patient's granddaughter at bedside. Level of care: Progressive Status is: Inpatient  Remains inpatient appropriate because: Need for IV antibiotics for severe sepsis due to pneumonia, encephalopathy, ABLA, thrombocytopenia and safe disposition       Consultants:  Gastroenterology Cardiology-signed off   Sch Meds:  Scheduled Meds:  allopurinol  150 mg Oral Daily   colchicine  0.3 mg Oral Daily   insulin aspart  0-6 Units Subcutaneous TID WC    pantoprazole (PROTONIX) IV  40 mg Intravenous Q12H   sodium zirconium cyclosilicate  10 g Oral Once   vancomycin variable dose per unstable renal function (pharmacist dosing)   Does not apply See admin instructions   Continuous Infusions:  sodium chloride Stopped (10/28/21 1945)   ceFEPime (MAXIPIME) IV 2 g (10/31/21 1118)   dextrose 5% lactated ringers 100 mL/hr at 10/30/21 2212   vancomycin 1,000 mg (10/30/21 1248)   PRN Meds:.acetaminophen **OR** acetaminophen, metoprolol tartrate  Antimicrobials: Anti-infectives (From admission, onward)    Start     Dose/Rate Route Frequency Ordered Stop   10/30/21 1045  vancomycin (VANCOREADY) IVPB 1000 mg/200 mL        1,000 mg 200 mL/hr over 60 Minutes Intravenous Every 36 hours 10/30/21 0957 11/05/21 1044   10/30/21 1015  ceFEPIme (MAXIPIME) 2 g in sodium chloride 0.9 % 100 mL IVPB        2 g 200 mL/hr over 30 Minutes Intravenous Every 12 hours 10/30/21 1010     10/29/21 2000  ceFEPIme (MAXIPIME) 2 g in sodium chloride 0.9 % 100 mL IVPB  Status:  Discontinued  2 g 200 mL/hr over 30 Minutes Intravenous Every 24 hours 10/28/21 2011 10/30/21 1010   10/28/21 2004  vancomycin variable dose per unstable renal function (pharmacist dosing)         Does not apply See admin instructions 10/28/21 2004     10/28/21 1945  ceFEPIme (MAXIPIME) 2 g in sodium chloride 0.9 % 100 mL IVPB        2 g 200 mL/hr over 30 Minutes Intravenous  Once 10/28/21 1940 10/28/21 2028   10/28/21 1945  metroNIDAZOLE (FLAGYL) IVPB 500 mg        500 mg 100 mL/hr over 60 Minutes Intravenous  Once 10/28/21 1940 10/28/21 2051   10/28/21 1945  vancomycin (VANCOREADY) IVPB 1500 mg/300 mL        1,500 mg 150 mL/hr over 120 Minutes Intravenous  Once 10/28/21 1940 10/28/21 2213        I have personally reviewed the following labs and images: CBC: Recent Labs  Lab 10/28/21 1808 10/29/21 0521 10/29/21 1559 10/30/21 0117 10/30/21 0743 10/31/21 0116  WBC 7.1 6.8  9.1 5.7 6.3 4.4  NEUTROABS 3.9  --   --   --   --   --   HGB 6.4* 8.5* 8.6* 7.6* 7.8* 8.0*  HCT 19.8* 24.8* 25.7* 21.8* 23.3* 23.7*  MCV 99.0 87.6 88.9 88.3 90.0 89.4  PLT 101* 69* 73* 60* 53* 44*   BMP &GFR Recent Labs  Lab 10/28/21 1808 10/28/21 2039 10/29/21 0521 10/30/21 0117 10/31/21 0116  NA 132* 136 137 135 135  K 6.2* 5.0 5.1 4.1 4.0  CL 102 105 107 105 108  CO2 14* 21* 22 21* 21*  GLUCOSE 250* 86 79 203* 250*  BUN 65* 58* 51* 36* 23  CREATININE 3.54* 2.68* 2.05* 1.50* 1.12  CALCIUM 8.6* 8.5* 8.6* 8.2* 8.1*   Estimated Creatinine Clearance: 40.6 mL/min (by C-G formula based on SCr of 1.12 mg/dL). Liver & Pancreas: Recent Labs  Lab 10/28/21 1808 10/31/21 0116  AST 26 16  ALT 23 18  ALKPHOS 78 55  BILITOT 1.0 1.1  PROT 5.8* 4.5*  ALBUMIN 2.8* 1.8*   No results for input(s): LIPASE, AMYLASE in the last 168 hours. No results for input(s): AMMONIA in the last 168 hours. Diabetic: Recent Labs    10/29/21 1559  HGBA1C 7.5*   Recent Labs  Lab 10/30/21 0752 10/30/21 1209 10/30/21 1603 10/30/21 2232 10/31/21 0755  GLUCAP 172* 215* 184* 232* 176*   Cardiac Enzymes: No results for input(s): CKTOTAL, CKMB, CKMBINDEX, TROPONINI in the last 168 hours. No results for input(s): PROBNP in the last 8760 hours. Coagulation Profile: Recent Labs  Lab 10/28/21 1808  INR 1.3*   Thyroid Function Tests: No results for input(s): TSH, T4TOTAL, FREET4, T3FREE, THYROIDAB in the last 72 hours. Lipid Profile: No results for input(s): CHOL, HDL, LDLCALC, TRIG, CHOLHDL, LDLDIRECT in the last 72 hours. Anemia Panel: Recent Labs    10/30/21 0117  VITAMINB12 3,633*  FOLATE 14.0  FERRITIN 902*  TIBC 209*  IRON 15*  RETICCTPCT 0.7   Urine analysis:    Component Value Date/Time   COLORURINE AMBER (A) 10/28/2021 1808   APPEARANCEUR HAZY (A) 10/28/2021 1808   LABSPEC 1.015 10/28/2021 1808   PHURINE 5.0 10/28/2021 1808   GLUCOSEU NEGATIVE 10/28/2021 1808   HGBUR  NEGATIVE 10/28/2021 1808   BILIRUBINUR NEGATIVE 10/28/2021 1808   KETONESUR NEGATIVE 10/28/2021 1808   PROTEINUR NEGATIVE 10/28/2021 1808   NITRITE NEGATIVE 10/28/2021 1808   LEUKOCYTESUR NEGATIVE  10/28/2021 1808   Sepsis Labs: Invalid input(s): PROCALCITONIN, [place]  Microbiology: Recent Results (from the past 240 hour(s))  Blood Culture (routine x 2)     Status: None (Preliminary result)   Collection Time: 10/28/21  6:20 PM   Specimen: BLOOD LEFT HAND  Result Value Ref Range Status   Specimen Description BLOOD LEFT HAND  Final   Special Requests   Final    BOTTLES DRAWN AEROBIC ONLY Blood Culture adequate volume   Culture   Final    NO GROWTH 3 DAYS Performed at [facility], [address] [place], [place] [address]    Report Status PENDING  Incomplete  Blood Culture (routine x 2)     Status: None (Preliminary result)   Collection Time: 10/28/21  6:46 PM   Specimen: BLOOD LEFT ARM  Result Value Ref Range Status   Specimen Description BLOOD LEFT ARM  Final   Special Requests   Final    BOTTLES DRAWN AEROBIC AND ANAEROBIC Blood Culture adequate volume   Culture   Final    NO GROWTH 3 DAYS Performed at [facility], [place] [address] [place], [place] [address]    Report Status PENDING  Incomplete  Resp Panel by RT-PCR (Flu A&B, Covid) Nasopharyngeal Swab     Status: None   Collection Time: 10/28/21  6:48 PM   Specimen: Nasopharyngeal Swab; Nasopharyngeal(NP) swabs in vial transport medium  Result Value Ref Range Status   SARS Coronavirus 2 by RT PCR NEGATIVE NEGATIVE Final    Comment: (NOTE) SARS-CoV-2 target nucleic acids are NOT DETECTED.  The SARS-CoV-2 RNA is generally detectable in upper respiratory specimens during the acute phase of infection. The lowest concentration of SARS-CoV-2 viral copies this assay can detect is 138 copies/mL. A negative result does not preclude SARS-Cov-2 infection and should not be used as the sole basis for treatment  or other patient management decisions. A negative result may occur with  improper specimen collection/handling, submission of specimen other than nasopharyngeal swab, presence of viral mutation(s) within the areas targeted by this assay, and inadequate number of viral copies(<138 copies/mL). A negative result must be combined with clinical observations, patient history, and epidemiological information. The expected result is Negative.  Fact Sheet for Patients:  EntrepreneurPulse.com.au  Fact Sheet for Healthcare Providers:  IncredibleEmployment.be  This test is no t yet approved or cleared by the [place] FDA and  has been authorized for detection and/or diagnosis of SARS-CoV-2 by FDA under an Emergency Use Authorization (EUA). This EUA will remain  in effect (meaning this test can be used) for the duration of the COVID-19 declaration under Section 564(b)(1) of the Act, 21 U.S.C.section 360bbb-3(b)(1), unless the authorization is terminated  or revoked sooner.       Influenza A by PCR NEGATIVE NEGATIVE Final   Influenza B by PCR NEGATIVE NEGATIVE Final    Comment: (NOTE) The Xpert Xpress SARS-CoV-2/FLU/RSV plus assay is intended as an aid in the diagnosis of influenza from Nasopharyngeal swab specimens and should not be used as a sole basis for treatment. Nasal washings and aspirates are unacceptable for Xpert Xpress SARS-CoV-2/FLU/RSV testing.  Fact Sheet for Patients: EntrepreneurPulse.com.au  Fact Sheet for Healthcare Providers: IncredibleEmployment.be  This test is not yet approved or cleared by the [place] FDA and has been authorized for detection and/or diagnosis of SARS-CoV-2 by FDA under an Emergency Use Authorization (EUA). This EUA will remain in effect (meaning this test can be used) for the duration  of the COVID-19 declaration under Section 564(b)(1) of the Act, 21 U.S.C. section  360bbb-3(b)(1), unless the authorization is terminated or revoked.  Performed at [facility], [place] [address] [place], [place] [address]   Urine Culture     Status: None   Collection Time: 10/28/21  8:51 PM   Specimen: In/Out Cath Urine  Result Value Ref Range Status   Specimen Description IN/OUT CATH URINE  Final   Special Requests NONE  Final   Culture   Final    NO GROWTH Performed at [facility], [place] [address] [place], [place] [address]    Report Status 10/29/2021 FINAL  Final  MRSA Next Gen by PCR, Nasal     Status: None   Collection Time: 10/29/21  8:51 AM   Specimen: Nasal Mucosa; Nasal Swab  Result Value Ref Range Status   MRSA by PCR Next Gen NOT DETECTED NOT DETECTED Final    Comment: (NOTE) The GeneXpert MRSA Assay (FDA approved for NASAL specimens only), is one component of a comprehensive MRSA colonization surveillance program. It is not intended to diagnose MRSA infection nor to guide or monitor treatment for MRSA infections. Test performance is not FDA approved in patients less than 80 years old. Performed at [facility], [place] [address] [place], [place] [address]     Radiology Studies: CT ABDOMEN PELVIS WO CONTRAST  Result Date: 10/30/2021 CLINICAL DATA:  Fever of unknown origin EXAM: CT ABDOMEN AND PELVIS WITHOUT CONTRAST TECHNIQUE: Multidetector CT imaging of the abdomen and pelvis was performed following the standard protocol without IV contrast. COMPARISON:  January 2021 FINDINGS: Lower chest: Partially imaged left lower lobe consolidative opacity. Additional bibasilar atelectasis. Hepatobiliary: No focal liver abnormality is seen. No gallstones, gallbladder wall thickening, or biliary dilatation. Pancreas: Unremarkable. Spleen: Unremarkable. Adrenals/Urinary Tract: Adrenals, kidneys, and bladder are unremarkable. Stomach/Bowel: Stomach is within normal limits. Bowel is normal in caliber. Vascular/Lymphatic: Mild aortic atherosclerosis.  No enlarged lymph nodes. Reproductive: Unremarkable. Other: No free fluid.  Abdominal wall is unremarkable. Musculoskeletal: Lumbar spine degenerative changes. No acute osseous abnormality. IMPRESSION: Partially imaged consolidative opacity left lower lobe may reflect atelectasis or pneumonia. No acute abnormality in the abdomen or pelvis. Electronically Signed   By: Macy Mis M.D.   On: 10/30/2021 14:06     60 minutes with more than 50% spent in reviewing records, counseling patient/family and coordinating care.  Shatasha Lambing T[address] [place]  If 7PM-7AM, please contact night-coverage [email or web address] 10/31/2021, 11:49 AM

## 2021-11-01 ENCOUNTER — Inpatient Hospital Stay (HOSPITAL_COMMUNITY): Payer: Medicare Other

## 2021-11-01 DIAGNOSIS — N179 Acute kidney failure, unspecified: Secondary | ICD-10-CM | POA: Diagnosis not present

## 2021-11-01 DIAGNOSIS — D62 Acute posthemorrhagic anemia: Secondary | ICD-10-CM | POA: Diagnosis not present

## 2021-11-01 DIAGNOSIS — G9341 Metabolic encephalopathy: Secondary | ICD-10-CM | POA: Diagnosis not present

## 2021-11-01 DIAGNOSIS — A419 Sepsis, unspecified organism: Secondary | ICD-10-CM | POA: Diagnosis not present

## 2021-11-01 LAB — CBC WITH DIFFERENTIAL/PLATELET
Abs Immature Granulocytes: 0.93 10*3/uL — ABNORMAL HIGH (ref 0.00–0.07)
Basophils Absolute: 0 10*3/uL (ref 0.0–0.1)
Basophils Relative: 1 %
Eosinophils Absolute: 0 10*3/uL (ref 0.0–0.5)
Eosinophils Relative: 0 %
HCT: 24.3 % — ABNORMAL LOW (ref 39.0–52.0)
Hemoglobin: 8.4 g/dL — ABNORMAL LOW (ref 13.0–17.0)
Immature Granulocytes: 22 %
Lymphocytes Relative: 23 %
Lymphs Abs: 1 10*3/uL (ref 0.7–4.0)
MCH: 31 pg (ref 26.0–34.0)
MCHC: 34.6 g/dL (ref 30.0–36.0)
MCV: 89.7 fL (ref 80.0–100.0)
Monocytes Absolute: 0.6 10*3/uL (ref 0.1–1.0)
Monocytes Relative: 13 %
Neutro Abs: 1.7 10*3/uL (ref 1.7–7.7)
Neutrophils Relative %: 41 %
Platelets: 47 10*3/uL — ABNORMAL LOW (ref 150–400)
RBC: 2.71 MIL/uL — ABNORMAL LOW (ref 4.22–5.81)
RDW: 16.2 % — ABNORMAL HIGH (ref 11.5–15.5)
WBC: 4.2 10*3/uL (ref 4.0–10.5)
nRBC: 0 % (ref 0.0–0.2)

## 2021-11-01 LAB — URIC ACID: Uric Acid, Serum: 3.3 mg/dL — ABNORMAL LOW (ref 3.7–8.6)

## 2021-11-01 LAB — COMPREHENSIVE METABOLIC PANEL
ALT: 35 U/L (ref 0–44)
AST: 31 U/L (ref 15–41)
Albumin: 1.8 g/dL — ABNORMAL LOW (ref 3.5–5.0)
Alkaline Phosphatase: 60 U/L (ref 38–126)
Anion gap: 7 (ref 5–15)
BUN: 20 mg/dL (ref 8–23)
CO2: 22 mmol/L (ref 22–32)
Calcium: 8.1 mg/dL — ABNORMAL LOW (ref 8.9–10.3)
Chloride: 106 mmol/L (ref 98–111)
Creatinine, Ser: 1.02 mg/dL (ref 0.61–1.24)
GFR, Estimated: >60 mL/min (ref >60)
Glucose, Bld: 126 mg/dL — ABNORMAL HIGH (ref 70–99)
Potassium: 3.9 mmol/L (ref 3.5–5.1)
Sodium: 135 mmol/L (ref 135–145)
Total Bilirubin: 0.9 mg/dL (ref 0.3–1.2)
Total Protein: 4.4 g/dL — ABNORMAL LOW (ref 6.5–8.1)

## 2021-11-01 LAB — AMMONIA: Ammonia: 17 umol/L (ref 9–35)

## 2021-11-01 LAB — BRAIN NATRIURETIC PEPTIDE: B Natriuretic Peptide: 187.8 pg/mL — ABNORMAL HIGH (ref 0.0–100.0)

## 2021-11-01 LAB — PHOSPHORUS: Phosphorus: 2.7 mg/dL (ref 2.5–4.6)

## 2021-11-01 LAB — GLUCOSE, CAPILLARY
Glucose-Capillary: 138 mg/dL — ABNORMAL HIGH (ref 70–99)
Glucose-Capillary: 180 mg/dL — ABNORMAL HIGH (ref 70–99)
Glucose-Capillary: 127 mg/dL — ABNORMAL HIGH (ref 70–99)
Glucose-Capillary: 122 mg/dL — ABNORMAL HIGH (ref 70–99)

## 2021-11-01 LAB — MAGNESIUM: Magnesium: 1.4 mg/dL — ABNORMAL LOW (ref 1.7–2.4)

## 2021-11-01 LAB — CK: Total CK: 27 U/L — ABNORMAL LOW (ref 49–397)

## 2021-11-01 LAB — RPR: RPR Ser Ql: NONREACTIVE

## 2021-11-01 LAB — C-REACTIVE PROTEIN: CRP: 6.4 mg/dL — ABNORMAL HIGH (ref <1.0)

## 2021-11-01 MED ORDER — DICLOFENAC SODIUM 1 % EX GEL
2.0000 g | Freq: Four times a day (QID) | CUTANEOUS | Status: DC
  Administered 2021-11-01 – 2021-11-10 (×35): 2 g via TOPICAL
  Filled 2021-11-01: qty 100

## 2021-11-01 MED ORDER — INSULIN GLARGINE-YFGN 100 UNIT/ML ~~LOC~~ SOLN
5.0000 [IU] | Freq: Every day | SUBCUTANEOUS | Status: DC
  Administered 2021-11-01 – 2021-11-10 (×10): 5 [IU] via SUBCUTANEOUS
  Filled 2021-11-01 (×10): qty 0.05

## 2021-11-01 MED ORDER — INSULIN ASPART 100 UNIT/ML IJ SOLN: 0.0000 [IU] | Freq: Every day | INTRAMUSCULAR | Status: DC

## 2021-11-01 MED ORDER — HALOPERIDOL LACTATE 5 MG/ML IJ SOLN
2.0000 mg | Freq: Once | INTRAMUSCULAR | Status: AC
  Administered 2021-11-02: 2 mg via INTRAMUSCULAR
  Filled 2021-11-01: qty 1

## 2021-11-01 MED ORDER — INSULIN ASPART 100 UNIT/ML IJ SOLN
0.0000 [IU] | Freq: Three times a day (TID) | INTRAMUSCULAR | Status: DC
  Administered 2021-11-01 – 2021-11-02 (×2): 1 [IU] via SUBCUTANEOUS
  Administered 2021-11-02: 2 [IU] via SUBCUTANEOUS
  Administered 2021-11-03: 1 [IU] via SUBCUTANEOUS
  Administered 2021-11-03: 2 [IU] via SUBCUTANEOUS
  Administered 2021-11-05: 17:00:00 1 [IU] via SUBCUTANEOUS
  Administered 2021-11-07: 2 [IU] via SUBCUTANEOUS
  Administered 2021-11-08: 1 [IU] via SUBCUTANEOUS

## 2021-11-01 MED ORDER — LORAZEPAM 2 MG/ML IJ SOLN
1.0000 mg | Freq: Once | INTRAMUSCULAR | Status: AC
  Administered 2021-11-01: 1 mg via INTRAVENOUS
  Filled 2021-11-01: qty 1

## 2021-11-01 MED ORDER — MAGNESIUM SULFATE 4 GM/100ML IV SOLN
4.0000 g | Freq: Once | INTRAVENOUS | Status: AC
  Administered 2021-11-01: 4 g via INTRAVENOUS
  Filled 2021-11-01: qty 100

## 2021-11-01 MED ORDER — TRAZODONE HCL 50 MG PO TABS
50.0000 mg | ORAL_TABLET | Freq: Every evening | ORAL | Status: DC | PRN
  Administered 2021-11-01 – 2021-11-08 (×7): 50 mg via ORAL
  Filled 2021-11-01 (×8): qty 1

## 2021-11-01 NOTE — Progress Notes (Signed)
PROGRESS NOTE  Eugene Cooper CBS:[id number] DOB: Dec 28, 1940   PCP: Emelia Loron, NP  Patient is from: Home.  Lives with family.  Uses rolling walker at baseline.  DOA: 10/28/2021 LOS: 4  Chief complaints:  Chief Complaint  Patient presents with   Altered Mental Status     Brief Narrative / Interim history: 80 year old Spanish-speaking male with PMH of CVA, LBBB, DM-2, CKD-3A, HLD and ambulatory dysfunction presenting with altered mental status, labored breathing, poor p.o. intake and bilateral lower extremity pain, and admitted for acute blood loss anemia, sinus tachycardia, AKI, hyperkalemia and severe sepsis due to LLL pneumonia.  Initially concern about A. fib which was ruled out by cardiology.  Hgb 6.4.  He Hemoccult was positive.  EGD revealed gastritis. Cr 3.5.  K6.2.  CXR with right suprahilar nodular opacity.  CTH negative.  CT abdomen and pelvis raises concern for LLL consolidation.  CT chest with patchy consolidation with air and LLL and LUL, 7 mm LUL nodule.    Patient was transfused 1 unit with appropriate response.  H&H stable.  He was started on IV vancomycin and cefepime but then de-escalated to IV Unasyn and doxycycline.  Main issue is his metabolic encephalopathy/confusion with intermittent agitation.   Subjective: Seen and examined earlier this morning with the help of video interpreter with ID number [id number].  Patient was confused and agitated last night.  He pulled off his IV lines.  He has mittens on this morning.  Patient's son at bedside.  He complains pain all over although he does not appear to be in pain or distress.  He is oriented to self and his son but not place or situation.  He is on room air.  Objective: Vitals:   11/01/21 0300 11/01/21 0400 11/01/21 0757 11/01/21 1153  BP: 104/63 122/72 106/66 108/66  Pulse: 88 86 85 87  Resp: 20 19 18 20   Temp: 98.8 F (37.1 C)  98.2 F (36.8 C) 97.7 F (36.5 C)  TempSrc: Oral  Oral Axillary  SpO2: 96% 92%  97% 98%  Weight:      Height:        Intake/Output Summary (Last 24 hours) at 11/01/2021 1329 Last data filed at 11/01/2021 0400 Gross per 24 hour  Intake 1377.75 ml  Output 20 ml  Net 1357.75 ml   Filed Weights   10/29/21 1345  Weight: 63.6 kg    Examination:  GENERAL: No apparent distress.  Nontoxic. HEENT: MMM.  Vision and hearing grossly intact.  NECK: Supple.  No apparent JVD.  RESP: 98% on RA.  No IWOB.  Fair aeration bilaterally. CVS:  RRR. Heart sounds normal.  ABD/GI/GU: BS+. Abd soft, NTND.  MSK/EXT:  Moves extremities.  Significant muscle mass and subcu fat loss.  Some spasticity in RLE.  Decreased ROM in right knee.  Some focal tenderness but no swelling or erythema. SKIN: no apparent skin lesion or wound NEURO: Awake and alert.  Oriented to self and his son.  Follows some commands.  No apparent focal neuro deficit. PSYCH: Calm. Normal affect.   Procedures:  11/31-EGD with erosive gastropathy and gastritis but no signs of bleeding.  Microbiology summarized: 11/20-COVID-19 and influenza PCR nonreactive. 11/21-MRSA PCR nonreactive. 11/20-blood culture and urine culture NGTD.  Assessment & Plan: Severe sepsis due to pneumonia: POA.  Heart tachycardia, tachypnea, AKI, AMS and lactic acidosis to 6.8.  Culture data as above.  CT C/A/P concerning for LUL and LLL consolidation.  Pro-Cal elevated. -11/20-Vanco and cefepime>> 11/23 Unasyn and  doxycycline>> -IS/OOB/PT/OT  ABLA/IDA/GI bleed/erosive gastropathy/gastritis: Hgb 14.6 in 12/2019.  Hemoccult positive.  EGD with erosive gastropathy and gastritis.  H&H stable after 1 unit. Recent Labs    10/28/21 1808 10/29/21 0521 10/29/21 1559 10/30/21 0117 10/30/21 0743 10/31/21 0116 11/01/21 0213  HGB 6.4* 8.5* 8.6* 7.6* 7.8* 8.0* 8.4*  -Monitor H&H and platelets. -P.o. Protonix 40 mg twice daily for 8 weeks -GI following-colonoscopy when able to drink prep.    Hypotension: Likely due to severe sepsis. TTE with  LVEF of 50 to 55% and RVSP of 31 mmHg but no other significant finding.  Resolved after IV fluid and IV antibiotics.   -Discontinue IV fluid   AKI/azotemia on CKD-3A: Could be prerenal and ATN from diuretics, NSAID and sepsis.  CK20 7.  Resolved Recent Labs    10/28/21 1808 10/28/21 2039 10/29/21 0521 10/30/21 0117 10/31/21 0116 11/01/21 0213  BUN 65* 58* 51* 36* 23 20  CREATININE 3.54* 2.68* 2.05* 1.50* 1.12 1.02  -Discontinue IV fluid  Acute metabolic encephalopathy: Suspect this to be due to severe sepsis.  Could have some element of delirium from hospitalization as well.  Family denies history of dementia.  Has a history of CVA.  No focal neurodeficit except for chronic RLE weakness.  CTH, B12, ammonia and RPR unrevealing.  No nuchal rigidity to suggest meningitis.  He is oriented to self and his family.  Agitated at times. -Treat treatable causes as above -Reorientation and delirium precautions  Uncontrolled DM-2 with hyperglycemia: A1c 7.5%. Recent Labs  Lab 10/31/21 1222 10/31/21 1649 10/31/21 2020 11/01/21 0755 11/01/21 1151  GLUCAP 134* 110* 160* 138* 180*  -Increase SSI to sensitive -Add basal insulin at 5 units daily   Thrombocytopenia: Likely from sepsis and cephalosporin. Recent Labs  Lab 10/28/21 1808 10/29/21 0521 10/29/21 1559 10/30/21 0117 10/30/21 0743 10/31/21 0116 11/01/21 0213  PLT 101* 69* 73* 60* 53* 44* 47*  -Continue monitoring -Management as above  7 mm LUL nodule.  Noted on CT chest. -Repeat CT chest in 6 to 12 months   Hyperkalemia: Likely due to AKI.  Resolved.   History of CVA: Stable. -Continue home aspirin and statin.   Gout: Uric acid 5.0>> 3.3. -On colchicine and allopurinol.  Left knee pain/tenderness/stiffness-likely arthritis. -Check x-ray -Voltaren gel  Language barrier affecting communication -Used video interpreter with ID number [id number].  Body mass index is 25.65 kg/m. Consult dietitian       DVT  prophylaxis:  SCDs Start: 10/28/21 2054 due to thrombocytopenia  Code Status: Full code Family Communication: Updated patient's son at bedside. Level of care: Progressive Status is: Inpatient  Remains inpatient appropriate because: Need for IV antibiotics for severe sepsis due to pneumonia, encephalopathy, ABLA, thrombocytopenia and safe disposition       Consultants:  Gastroenterology-following Cardiology-signed off   Sch Meds:  Scheduled Meds:  allopurinol  150 mg Oral Daily   colchicine  0.3 mg Oral Daily   diclofenac Sodium  2 g Topical QID   doxycycline  100 mg Oral Q12H   insulin aspart  0-5 Units Subcutaneous QHS   insulin aspart  0-9 Units Subcutaneous TID WC   insulin glargine-yfgn  5 Units Subcutaneous Daily   pantoprazole (PROTONIX) IV  40 mg Intravenous Q12H   sodium zirconium cyclosilicate  10 g Oral Once   Continuous Infusions:  sodium chloride Stopped (10/28/21 1945)   ampicillin-sulbactam (UNASYN) IV 3 g (11/01/21 1242)   PRN Meds:.acetaminophen **OR** acetaminophen, metoprolol tartrate  Antimicrobials: Anti-infectives (From admission, onward)  Start     Dose/Rate Route Frequency Ordered Stop   10/31/21 1300  doxycycline (VIBRA-TABS) tablet 100 mg        100 mg Oral Every 12 hours 10/31/21 1200 11/05/21 0959   10/31/21 1245  Ampicillin-Sulbactam (UNASYN) 3 g in sodium chloride 0.9 % 100 mL IVPB        3 g 200 mL/hr over 30 Minutes Intravenous Every 6 hours 10/31/21 1200 11/05/21 1259   10/30/21 1045  vancomycin (VANCOREADY) IVPB 1000 mg/200 mL  Status:  Discontinued        1,000 mg 200 mL/hr over 60 Minutes Intravenous Every 36 hours 10/30/21 0957 10/31/21 1200   10/30/21 1015  ceFEPIme (MAXIPIME) 2 g in sodium chloride 0.9 % 100 mL IVPB  Status:  Discontinued        2 g 200 mL/hr over 30 Minutes Intravenous Every 12 hours 10/30/21 1010 10/31/21 1200   10/29/21 2000  ceFEPIme (MAXIPIME) 2 g in sodium chloride 0.9 % 100 mL IVPB  Status:   Discontinued        2 g 200 mL/hr over 30 Minutes Intravenous Every 24 hours 10/28/21 2011 10/30/21 1010   10/28/21 2004  vancomycin variable dose per unstable renal function (pharmacist dosing)  Status:  Discontinued         Does not apply See admin instructions 10/28/21 2004 10/31/21 1200   10/28/21 1945  ceFEPIme (MAXIPIME) 2 g in sodium chloride 0.9 % 100 mL IVPB        2 g 200 mL/hr over 30 Minutes Intravenous  Once 10/28/21 1940 10/28/21 2028   10/28/21 1945  metroNIDAZOLE (FLAGYL) IVPB 500 mg        500 mg 100 mL/hr over 60 Minutes Intravenous  Once 10/28/21 1940 10/28/21 2051   10/28/21 1945  vancomycin (VANCOREADY) IVPB 1500 mg/300 mL        1,500 mg 150 mL/hr over 120 Minutes Intravenous  Once 10/28/21 1940 10/28/21 2213        I have personally reviewed the following labs and images: CBC: Recent Labs  Lab 10/28/21 1808 10/29/21 0521 10/29/21 1559 10/30/21 0117 10/30/21 0743 10/31/21 0116 11/01/21 0213  WBC 7.1   < > 9.1 5.7 6.3 4.4 4.2  NEUTROABS 3.9  --   --   --   --   --  1.7  HGB 6.4*   < > 8.6* 7.6* 7.8* 8.0* 8.4*  HCT 19.8*   < > 25.7* 21.8* 23.3* 23.7* 24.3*  MCV 99.0   < > 88.9 88.3 90.0 89.4 89.7  PLT 101*   < > 73* 60* 53* 44* 47*   < > = values in this interval not displayed.   BMP &GFR Recent Labs  Lab 10/28/21 2039 10/29/21 0521 10/30/21 0117 10/31/21 0116 11/01/21 0213  NA 136 137 135 135 135  K 5.0 5.1 4.1 4.0 3.9  CL 105 107 105 108 106  CO2 21* 22 21* 21* 22  GLUCOSE 86 79 203* 250* 126*  BUN 58* 51* 36* 23 20  CREATININE 2.68* 2.05* 1.50* 1.12 1.02  CALCIUM 8.5* 8.6* 8.2* 8.1* 8.1*  MG  --   --   --   --  1.4*  PHOS  --   --   --   --  2.7   Estimated Creatinine Clearance: 44.6 mL/min (by C-G formula based on SCr of 1.02 mg/dL). Liver & Pancreas: Recent Labs  Lab 10/28/21 1808 10/31/21 0116 11/01/21 0213  AST 26 16 31  ALT 23 18 35  ALKPHOS 78 55 60  BILITOT 1.0 1.1 0.9  PROT 5.8* 4.5* 4.4*  ALBUMIN 2.8* 1.8* 1.8*    No results for input(s): LIPASE, AMYLASE in the last 168 hours. Recent Labs  Lab 11/01/21 0213  AMMONIA 17   Diabetic: Recent Labs    10/29/21 1559  HGBA1C 7.5*   Recent Labs  Lab 10/31/21 1222 10/31/21 1649 10/31/21 2020 11/01/21 0755 11/01/21 1151  GLUCAP 134* 110* 160* 138* 180*   Cardiac Enzymes: Recent Labs  Lab 11/01/21 0213  CKTOTAL 27*   No results for input(s): PROBNP in the last 8760 hours. Coagulation Profile: Recent Labs  Lab 10/28/21 1808  INR 1.3*   Thyroid Function Tests: No results for input(s): TSH, T4TOTAL, FREET4, T3FREE, THYROIDAB in the last 72 hours. Lipid Profile: No results for input(s): CHOL, HDL, LDLCALC, TRIG, CHOLHDL, LDLDIRECT in the last 72 hours. Anemia Panel: Recent Labs    10/30/21 0117  VITAMINB12 3,633*  FOLATE 14.0  FERRITIN 902*  TIBC 209*  IRON 15*  RETICCTPCT 0.7   Urine analysis:    Component Value Date/Time   COLORURINE AMBER (A) 10/28/2021 1808   APPEARANCEUR HAZY (A) 10/28/2021 1808   LABSPEC 1.015 10/28/2021 1808   PHURINE 5.0 10/28/2021 1808   GLUCOSEU NEGATIVE 10/28/2021 1808   HGBUR NEGATIVE 10/28/2021 1808   BILIRUBINUR NEGATIVE 10/28/2021 1808   KETONESUR NEGATIVE 10/28/2021 1808   PROTEINUR NEGATIVE 10/28/2021 1808   NITRITE NEGATIVE 10/28/2021 1808   LEUKOCYTESUR NEGATIVE 10/28/2021 1808   Sepsis Labs: Invalid input(s): PROCALCITONIN, [place]  Microbiology: Recent Results (from the past 240 hour(s))  Blood Culture (routine x 2)     Status: None (Preliminary result)   Collection Time: 10/28/21  6:20 PM   Specimen: BLOOD LEFT HAND  Result Value Ref Range Status   Specimen Description BLOOD LEFT HAND  Final   Special Requests   Final    BOTTLES DRAWN AEROBIC ONLY Blood Culture adequate volume   Culture   Final    NO GROWTH 3 DAYS Performed at [facility], [address], [place], [place] [address]    Report Status PENDING  Incomplete  Blood Culture (routine x 2)     Status:  None (Preliminary result)   Collection Time: 10/28/21  6:46 PM   Specimen: BLOOD LEFT ARM  Result Value Ref Range Status   Specimen Description BLOOD LEFT ARM  Final   Special Requests   Final    BOTTLES DRAWN AEROBIC AND ANAEROBIC Blood Culture adequate volume   Culture   Final    NO GROWTH 3 DAYS Performed at [facility], [place] [address], [place], [place] [address]    Report Status PENDING  Incomplete  Resp Panel by RT-PCR (Flu A&B, Covid) Nasopharyngeal Swab     Status: None   Collection Time: 10/28/21  6:48 PM   Specimen: Nasopharyngeal Swab; Nasopharyngeal(NP) swabs in vial transport medium  Result Value Ref Range Status   SARS Coronavirus 2 by RT PCR NEGATIVE NEGATIVE Final    Comment: (NOTE) SARS-CoV-2 target nucleic acids are NOT DETECTED.  The SARS-CoV-2 RNA is generally detectable in upper respiratory specimens during the acute phase of infection. The lowest concentration of SARS-CoV-2 viral copies this assay can detect is 138 copies/mL. A negative result does not preclude SARS-Cov-2 infection and should not be used as the sole basis for treatment or other patient management decisions. A negative result may occur with  improper specimen collection/handling, submission of  specimen other than nasopharyngeal swab, presence of viral mutation(s) within the areas targeted by this assay, and inadequate number of viral copies(<138 copies/mL). A negative result must be combined with clinical observations, patient history, and epidemiological information. The expected result is Negative.  Fact Sheet for Patients:  EntrepreneurPulse.com.au  Fact Sheet for Healthcare Providers:  IncredibleEmployment.be  This test is no t yet approved or cleared by the [place] FDA and  has been authorized for detection and/or diagnosis of SARS-CoV-2 by FDA under an Emergency Use Authorization (EUA). This EUA will remain  in effect (meaning this  test can be used) for the duration of the COVID-19 declaration under Section 564(b)(1) of the Act, 21 U.S.C.section 360bbb-3(b)(1), unless the authorization is terminated  or revoked sooner.       Influenza A by PCR NEGATIVE NEGATIVE Final   Influenza B by PCR NEGATIVE NEGATIVE Final    Comment: (NOTE) The Xpert Xpress SARS-CoV-2/FLU/RSV plus assay is intended as an aid in the diagnosis of influenza from Nasopharyngeal swab specimens and should not be used as a sole basis for treatment. Nasal washings and aspirates are unacceptable for Xpert Xpress SARS-CoV-2/FLU/RSV testing.  Fact Sheet for Patients: EntrepreneurPulse.com.au  Fact Sheet for Healthcare Providers: IncredibleEmployment.be  This test is not yet approved or cleared by the [place] FDA and has been authorized for detection and/or diagnosis of SARS-CoV-2 by FDA under an Emergency Use Authorization (EUA). This EUA will remain in effect (meaning this test can be used) for the duration of the COVID-19 declaration under Section 564(b)(1) of the Act, 21 U.S.C. section 360bbb-3(b)(1), unless the authorization is terminated or revoked.  Performed at [facility], [place] [address], [place], [place] [address]   Urine Culture     Status: None   Collection Time: 10/28/21  8:51 PM   Specimen: In/Out Cath Urine  Result Value Ref Range Status   Specimen Description IN/OUT CATH URINE  Final   Special Requests NONE  Final   Culture   Final    NO GROWTH Performed at [facility], [place] [address], [place], [place] [address]    Report Status 10/29/2021 FINAL  Final  MRSA Next Gen by PCR, Nasal     Status: None   Collection Time: 10/29/21  8:51 AM   Specimen: Nasal Mucosa; Nasal Swab  Result Value Ref Range Status   MRSA by PCR Next Gen NOT DETECTED NOT DETECTED Final    Comment: (NOTE) The GeneXpert MRSA Assay (FDA approved for NASAL specimens only), is one component of a  comprehensive MRSA colonization surveillance program. It is not intended to diagnose MRSA infection nor to guide or monitor treatment for MRSA infections. Test performance is not FDA approved in patients less than 62 years old. Performed at [facility], [place] [address], [place], [place] [address]     Radiology Studies: CT CHEST W CONTRAST  Result Date: 10/31/2021 CLINICAL DATA:  Fever of unknown origin, consolidation left lung base, pulmonary nodule EXAM: CT CHEST WITH CONTRAST TECHNIQUE: Multidetector CT imaging of the chest was performed during intravenous contrast administration. CONTRAST:  66mL OMNIPAQUE IOHEXOL 300 MG/ML  SOLN COMPARISON:  10/30/2021, 10/28/2021 FINDINGS: Cardiovascular: The heart is unremarkable without pericardial effusion. No evidence of thoracic aortic aneurysm or dissection. Moderate atherosclerosis of the aortic arch. Mediastinum/Nodes: Borderline enlarged 10 mm short axis lymph nodes are seen within the as ago esophageal recess. No pathologic mediastinal, hilar, or axillary adenopathy. Thyroid gland, trachea, and esophagus demonstrate no  significant findings. Lungs/Pleura: Dependent hypoventilatory changes are seen at the lung bases. There is patchy consolidation within the superior segment left lower lobe and lingula, which may be hypoventilatory or infectious. Trace bilateral pleural effusions are noted. There is no pneumothorax. A single solid 7 mm nodule is seen within the lingular segment left upper lobe, reference image 100/4. No other pulmonary nodules or masses. Specifically, the right upper lobe density seen on recent chest x-ray likely corresponds to overlapping shadows or vascular shadow. Upper Abdomen: No acute abnormality. Musculoskeletal: No acute or destructive bony lesions. Reconstructed images demonstrate no additional findings. IMPRESSION: 1. Patchy consolidation within the superior segment left lower lobe and left upper lobe, which may reflect  bronchopneumonia in a patient with a history of fever. Other areas of dependent consolidations within the lower lobes along the posterior costophrenic angles are likely hypoventilatory. 2. Trace bilateral pleural effusions. 3. 7 mm left solid pulmonary nodule within the upper lobe. Recommend a non-contrast Chest CT at 6-12 months. If patient is high risk for malignancy, recommend an additional non-contrast Chest CT at 18-24 months; if patient is low risk for malignancy a non-contrast Chest CT at 18-24 months is optional. These guidelines do not apply to immunocompromised patients and patients with cancer. Follow up in patients with significant comorbidities as clinically warranted. For lung cancer screening, adhere to Lung-RADS guidelines. Reference: Radiology. 2017; 284(1):228-43. 4.  Aortic Atherosclerosis (ICD10-I70.0). Electronically Signed   By: Randa Ngo M.D.   On: 10/31/2021 15:53     [facility] [place]  If 7PM-7AM, please contact night-coverage [email or web address] 11/01/2021, 1:29 PM

## 2021-11-02 ENCOUNTER — Encounter (HOSPITAL_COMMUNITY): Payer: Self-pay | Admitting: Gastroenterology

## 2021-11-02 DIAGNOSIS — G9341 Metabolic encephalopathy: Secondary | ICD-10-CM | POA: Diagnosis not present

## 2021-11-02 DIAGNOSIS — D62 Acute posthemorrhagic anemia: Secondary | ICD-10-CM | POA: Diagnosis not present

## 2021-11-02 DIAGNOSIS — A419 Sepsis, unspecified organism: Secondary | ICD-10-CM | POA: Diagnosis not present

## 2021-11-02 DIAGNOSIS — N179 Acute kidney failure, unspecified: Secondary | ICD-10-CM | POA: Diagnosis not present

## 2021-11-02 LAB — RENAL FUNCTION PANEL
Albumin: 1.9 g/dL — ABNORMAL LOW (ref 3.5–5.0)
Anion gap: 8 (ref 5–15)
BUN: 15 mg/dL (ref 8–23)
CO2: 25 mmol/L (ref 22–32)
Calcium: 7.9 mg/dL — ABNORMAL LOW (ref 8.9–10.3)
Chloride: 102 mmol/L (ref 98–111)
Creatinine, Ser: 1.02 mg/dL (ref 0.61–1.24)
GFR, Estimated: >60 mL/min (ref >60)
Glucose, Bld: 120 mg/dL — ABNORMAL HIGH (ref 70–99)
Phosphorus: 3.3 mg/dL (ref 2.5–4.6)
Potassium: 3.7 mmol/L (ref 3.5–5.1)
Sodium: 135 mmol/L (ref 135–145)

## 2021-11-02 LAB — CULTURE, BLOOD (ROUTINE X 2)
Culture: NO GROWTH
Culture: NO GROWTH
Special Requests: ADEQUATE
Special Requests: ADEQUATE

## 2021-11-02 LAB — CBC
HCT: 25.2 % — ABNORMAL LOW (ref 39.0–52.0)
Hemoglobin: 8.3 g/dL — ABNORMAL LOW (ref 13.0–17.0)
MCH: 29.9 pg (ref 26.0–34.0)
MCHC: 32.9 g/dL (ref 30.0–36.0)
MCV: 90.6 fL (ref 80.0–100.0)
Platelets: 52 10*3/uL — ABNORMAL LOW (ref 150–400)
RBC: 2.78 MIL/uL — ABNORMAL LOW (ref 4.22–5.81)
RDW: 15.5 % (ref 11.5–15.5)
WBC: 3.8 10*3/uL — ABNORMAL LOW (ref 4.0–10.5)
nRBC: 0 % (ref 0.0–0.2)

## 2021-11-02 LAB — MAGNESIUM: Magnesium: 1.8 mg/dL (ref 1.7–2.4)

## 2021-11-02 LAB — GLUCOSE, CAPILLARY
Glucose-Capillary: 125 mg/dL — ABNORMAL HIGH (ref 70–99)
Glucose-Capillary: 115 mg/dL — ABNORMAL HIGH (ref 70–99)
Glucose-Capillary: 168 mg/dL — ABNORMAL HIGH (ref 70–99)
Glucose-Capillary: 141 mg/dL — ABNORMAL HIGH (ref 70–99)

## 2021-11-02 LAB — PROCALCITONIN: Procalcitonin: 0.26 ng/mL

## 2021-11-02 LAB — SEDIMENTATION RATE: Sed Rate: 75 mm/hr — ABNORMAL HIGH (ref 0–16)

## 2021-11-02 MED ORDER — QUETIAPINE FUMARATE 25 MG PO TABS
25.0000 mg | ORAL_TABLET | Freq: Every day | ORAL | Status: DC
  Administered 2021-11-02 – 2021-11-09 (×8): 25 mg via ORAL
  Filled 2021-11-02 (×8): qty 1

## 2021-11-02 MED ORDER — IBUPROFEN 400 MG PO TABS
400.0000 mg | ORAL_TABLET | ORAL | Status: DC | PRN
  Administered 2021-11-02: 400 mg via ORAL
  Filled 2021-11-02: qty 1

## 2021-11-02 MED ORDER — COLCHICINE 0.6 MG PO TABS
0.6000 mg | ORAL_TABLET | Freq: Every day | ORAL | Status: DC
  Administered 2021-11-02 – 2021-11-03 (×2): 0.6 mg via ORAL
  Filled 2021-11-02 (×2): qty 1

## 2021-11-02 NOTE — Progress Notes (Signed)
Patient alert and oriented to self. He is calm and drowsy, but easily arousable. Patient given tylenol (see MAR).   11/02/21 1944  Assess: MEWS Score  Temp (!) 102.3 F (39.1 C)  BP (!) 108/59  Pulse Rate (!) 104  ECG Heart Rate (!) 103  Resp 18  Level of Consciousness Alert  SpO2 94 %  O2 Device Room Air  Assess: MEWS Score  MEWS Temp 2  MEWS Systolic 0  MEWS Pulse 1  MEWS RR 0  MEWS LOC 0  MEWS Score 3  MEWS Score Color Yellow  Assess: if the MEWS score is Yellow or Red  Were vital signs taken at a resting state? Yes  Focused Assessment No change from prior assessment  Early Detection of Sepsis Score *See Row Information* High  MEWS guidelines implemented *See Row Information* Yes  Treat  MEWS Interventions Administered prn meds/treatments  Pain Scale 0-10  Pain Score Asleep  Take Vital Signs  Increase Vital Sign Frequency  Yellow: Q 2hr X 2 then Q 4hr X 2, if remains yellow, continue Q 4hrs  Escalate  MEWS: Escalate Yellow: discuss with charge nurse/RN and consider discussing with provider and RRT  Notify: Charge Nurse/RN  Name of Charge Nurse/RN Notified Gerald Stabs RN  Date Charge Nurse/RN Notified 11/02/21  Time Charge Nurse/RN Notified 1955  Notify: Provider  Provider Name/Title Wanda Plump MD  Date Provider Notified 11/02/21  Time Provider Notified 2017  Notification Type Page  Notification Reason Other (Comment) (elevated temperature- monitor for sepsis)  Provider response No new orders  Document  Patient Outcome Other (Comment) (Interventions in progress)  Progress note created (see row info) Yes

## 2021-11-02 NOTE — Progress Notes (Signed)
Patient appears to be experiencing hospital acquired delirium. He is alert and oriented to self. Patient has pulled out multiple IVs during his stay as well as inhibiting patient care by swinging arms at staff and spitting out medications.   He has still not been able to get any sleep and continues to be restless despite being given trazodone, ativan, and haldol (see MAR). Will continue to try to reorient patient. Safety mittens in place and family providing supervision at bedside.

## 2021-11-02 NOTE — Progress Notes (Signed)
Progress Note Hospital Day: 6  Chief Complaint:  anemia       ASSESSMENT AND PLAN   Brief history : 80 yo non-English speaking male with DM2, hx of CVA, obesity. Admitted with Afib, FOBT + anemia ( hgb 6 range), sepsis PNA, AKI and acute metabolic encephalopathy.    # Anemia, FOBT+. Erosive gastropathy on EGD not felt to explain anemia. No evidence for overt GI bleeding.  --Hgb stable ( 8.3) post transfusion. I believe he has received 3 units of PRBCs thus far. Received unit of blood on 11/20 and 11/21. Presumably got a 3rd unit on 11/22 for a hgb of 7.6. Transfusion status still showing as "transfusing" but RN found post transfusion vitals so presumably transfusion was completed  --Patient had restless night. Got Trazadone, Haldol and a 1 mg of IV Ativan and now is sleeping. Not able to discuss colonoscopy with him right now. I did talk briefly with granddaughters in the room and told them we were thinking about colonoscopy when patient is medically stable.    # Severe sepsis due to PNA.   # Thrombocytopenia, started this admission.  corresponding with anemia. Platelets 101 >>73>> 50's. Due to sepsis?    SUBJECTIVE   Sleepy. Granddaughter says patient has been coughing this am.        OBJECTIVE      Scheduled inpatient medications:   allopurinol  150 mg Oral Daily   colchicine  0.6 mg Oral Daily   diclofenac Sodium  2 g Topical QID   doxycycline  100 mg Oral Q12H   insulin aspart  0-5 Units Subcutaneous QHS   insulin aspart  0-9 Units Subcutaneous TID WC   insulin glargine-yfgn  5 Units Subcutaneous Daily   pantoprazole (PROTONIX) IV  40 mg Intravenous Q12H   QUEtiapine  25 mg Oral QHS   sodium zirconium cyclosilicate  10 g Oral Once   Continuous inpatient infusions:   sodium chloride Stopped (10/28/21 1945)   ampicillin-sulbactam (UNASYN) IV 3 g (11/02/21 0704)   PRN inpatient medications: acetaminophen **OR** acetaminophen, metoprolol tartrate,  traZODone  Vital signs in last 24 hours: Temp:  [97.7 F (36.5 C)-99.7 F (37.6 C)] 98.6 F (37 C) (11/25 0721) Pulse Rate:  [83-101] 92 (11/25 0721) Resp:  [18-20] 20 (11/25 0721) BP: (108-116)/(56-73) 111/63 (11/25 0721) SpO2:  [90 %-98 %] 90 % (11/25 0721) Last BM Date: 10/31/21  Intake/Output Summary (Last 24 hours) at 11/02/2021 1124 Last data filed at 11/02/2021 0400 Gross per 24 hour  Intake 446.18 ml  Output 1500 ml  Net -1053.82 ml     Physical Exam:  General: sleepy, NAD Heart:  Regular rate and rhythm. No lower extremity edema Pulmonary: Normal respiratory effort Abdomen: Soft, nondistended, nontender. Normal bowel sounds.    Filed Weights   10/29/21 1345  Weight: 63.6 kg    Intake/Output from previous day: 11/24 0701 - 11/25 0700 In: 446.2 [IV Piggyback:446.2] Out: 1500 [Urine:1500] Intake/Output this shift: No intake/output data recorded.    Lab Results: Recent Labs    10/31/21 0116 11/01/21 0213 11/02/21 0131  WBC 4.4 4.2 3.8*  HGB 8.0* 8.4* 8.3*  HCT 23.7* 24.3* 25.2*  PLT 44* 47* 52*   BMET Recent Labs    10/31/21 0116 11/01/21 0213 11/02/21 0131  NA 135 135 135  K 4.0 3.9 3.7  CL 108 106 102  CO2 21* 22 25  GLUCOSE 250* 126* 120*  BUN 23 20 15   CREATININE 1.12 1.02 1.02  CALCIUM 8.1* 8.1* 7.9*   LFT Recent Labs    11/01/21 0213 11/02/21 0131  PROT 4.4*  --   ALBUMIN 1.8* 1.9*  AST 31  --   ALT 35  --   ALKPHOS 60  --   BILITOT 0.9  --    PT/INR No results for input(s): LABPROT, INR in the last 72 hours. Hepatitis Panel No results for input(s): HEPBSAG, HCVAB, HEPAIGM, HEPBIGM in the last 72 hours.  DG Knee 1-2 Views Right  Result Date: 11/01/2021 CLINICAL DATA:  Pain EXAM: RIGHT KNEE - 1-2 VIEW COMPARISON:  None. FINDINGS: Enthesopathic changes off the superior patella. No joint effusion. No fracture. No other acute abnormalities. IMPRESSION: Enthesopathic changes off the superior patella. No other  abnormalities. Electronically Signed   By: Dorise Bullion III M.D.   On: 11/01/2021 16:26   CT CHEST W CONTRAST  Result Date: 10/31/2021 CLINICAL DATA:  Fever of unknown origin, consolidation left lung base, pulmonary nodule EXAM: CT CHEST WITH CONTRAST TECHNIQUE: Multidetector CT imaging of the chest was performed during intravenous contrast administration. CONTRAST:  18mL OMNIPAQUE IOHEXOL 300 MG/ML  SOLN COMPARISON:  10/30/2021, 10/28/2021 FINDINGS: Cardiovascular: The heart is unremarkable without pericardial effusion. No evidence of thoracic aortic aneurysm or dissection. Moderate atherosclerosis of the aortic arch. Mediastinum/Nodes: Borderline enlarged 10 mm short axis lymph nodes are seen within the as ago esophageal recess. No pathologic mediastinal, hilar, or axillary adenopathy. Thyroid gland, trachea, and esophagus demonstrate no significant findings. Lungs/Pleura: Dependent hypoventilatory changes are seen at the lung bases. There is patchy consolidation within the superior segment left lower lobe and lingula, which may be hypoventilatory or infectious. Trace bilateral pleural effusions are noted. There is no pneumothorax. A single solid 7 mm nodule is seen within the lingular segment left upper lobe, reference image 100/4. No other pulmonary nodules or masses. Specifically, the right upper lobe density seen on recent chest x-ray likely corresponds to overlapping shadows or vascular shadow. Upper Abdomen: No acute abnormality. Musculoskeletal: No acute or destructive bony lesions. Reconstructed images demonstrate no additional findings. IMPRESSION: 1. Patchy consolidation within the superior segment left lower lobe and left upper lobe, which may reflect bronchopneumonia in a patient with a history of fever. Other areas of dependent consolidations within the lower lobes along the posterior costophrenic angles are likely hypoventilatory. 2. Trace bilateral pleural effusions. 3. 7 mm left solid  pulmonary nodule within the upper lobe. Recommend a non-contrast Chest CT at 6-12 months. If patient is high risk for malignancy, recommend an additional non-contrast Chest CT at 18-24 months; if patient is low risk for malignancy a non-contrast Chest CT at 18-24 months is optional. These guidelines do not apply to immunocompromised patients and patients with cancer. Follow up in patients with significant comorbidities as clinically warranted. For lung cancer screening, adhere to Lung-RADS guidelines. Reference: Radiology. 2017; 284(1):228-43. 4.  Aortic Atherosclerosis (ICD10-I70.0). Electronically Signed   By: Randa Ngo M.D.   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

## 2021-11-02 NOTE — Progress Notes (Signed)
PROGRESS NOTE  Eugene Cooper PIR:[id number] DOB: 15-Aug-1941   PCP: Emelia Loron, NP  Patient is from: Home.  Lives with family.  Uses rolling walker at baseline.  DOA: 10/28/2021 LOS: 5  Chief complaints:  Chief Complaint  Patient presents with   Altered Mental Status     Brief Narrative / Interim history: 80 year old Spanish-speaking male with PMH of CVA, LBBB, DM-2, CKD-3A, HLD and ambulatory dysfunction presenting with altered mental status, labored breathing, poor p.o. intake and bilateral lower extremity pain, and admitted for acute blood loss anemia, sinus tachycardia, AKI, hyperkalemia and severe sepsis due to LLL pneumonia.  Initially concern about A. fib which was ruled out by cardiology.  Hgb 6.4.  He Hemoccult was positive.  EGD revealed gastritis. Cr 3.5.  K6.2.  CXR with right suprahilar nodular opacity.  CTH negative.  CT abdomen and pelvis raises concern for LLL consolidation.  CT chest with patchy consolidation with air and LLL and LUL, 7 mm LUL nodule.    Patient was transfused 1 unit with appropriate response.  H&H stable.  He was started on IV vancomycin and cefepime but then de-escalated to IV Unasyn and doxycycline.  Main issue is his metabolic encephalopathy/confusion with intermittent agitation.   Subjective: Seen and examined earlier this morning.  Patient has an episode of delirium and confusion requiring as needed medications and mittens.  He is somewhat sleepy but wakes to voice this morning.  He is oriented to self only.  Responds no to pain  Objective: Vitals:   11/02/21 0000 11/02/21 0425 11/02/21 0721 11/02/21 1138  BP: (!) 113/56 116/65 111/63 131/69  Pulse: (!) 101 89 92 82  Resp: 18 20 20 20   Temp: 98.9 F (37.2 C) 99.7 F (37.6 C) 98.6 F (37 C) 98.6 F (37 C)  TempSrc: Axillary Axillary Axillary Axillary  SpO2: 96% 97% 90% 92%  Weight:      Height:        Intake/Output Summary (Last 24 hours) at 11/02/2021 1345 Last data filed at  11/02/2021 0400 Gross per 24 hour  Intake 446.18 ml  Output 1500 ml  Net -1053.82 ml   Filed Weights   10/29/21 1345  Weight: 63.6 kg    Examination:  GENERAL: No apparent distress.  Nontoxic. HEENT: MMM.  Vision and hearing grossly intact.  NECK: Supple.  No apparent JVD.  RESP: 92% on RA.  No IWOB.  Fair aeration bilaterally. CVS:  RRR. Heart sounds normal.  ABD/GI/GU: BS+. Abd soft, NTND.  MSK/EXT:  Moves extremities.  Significant muscle mass and subcu fat loss.  Decreased ROM in right knee. SKIN: no apparent skin lesion or wound NEURO: Sleepy but wakes to voice.  Oriented to self.  Follows some commands.  No apparent focal neuro deficit. PSYCH: Sleepy and calm.  Procedures:  11/31-EGD with erosive gastropathy and gastritis but no signs of bleeding.  Microbiology summarized: 11/20-COVID-19 and influenza PCR nonreactive. 11/21-MRSA PCR nonreactive. 11/20-blood culture and urine culture NGTD.  Assessment & Plan: Severe sepsis due to pneumonia: POA.  Heart tachycardia, tachypnea, AKI, AMS and lactic acidosis to 6.8.  Culture data as above.  CT C/A/P concerning for LUL and LLL consolidation.  Pro-Cal elevated but improving. -11/20-Vanco and cefepime>> 11/23 Unasyn and doxycycline>> -IS/OOB/PT/OT  ABLA/IDA/GI bleed/erosive gastropathy/gastritis: Hgb 14.6 in 12/2019.  Hemoccult positive.  EGD with erosive gastropathy and gastritis.  H&H stable after 1 unit. Recent Labs    10/28/21 1808 10/29/21 6301 10/29/21 1559 10/30/21 0117 10/30/21 6010 10/31/21 0116 11/01/21 9323  11/02/21 0131  HGB 6.4* 8.5* 8.6* 7.6* 7.8* 8.0* 8.4* 8.3*  -Monitor H&H and platelets. -P.o. Protonix 40 mg twice daily for 8 weeks -GI following-colonoscopy when able to drink prep.    Hypotension: Likely due to severe sepsis. TTE with LVEF of 50 to 55% and RVSP of 31 mmHg but no other significant finding.  Resolved after IV fluid and IV antibiotics.   -Discontinue IV fluid   AKI/azotemia on  CKD-3A: Could be prerenal and ATN from diuretics, NSAID and sepsis.  Resolved Recent Labs    10/28/21 1808 10/28/21 2039 10/29/21 0521 10/30/21 0117 10/31/21 0116 11/01/21 0213 11/02/21 0131  BUN 65* 58* 51* 36* 23 20 15   CREATININE 3.54* 2.68* 2.05* 1.50* 1.12 1.02 1.02  -Discontinue IV fluid  Acute metabolic encephalopathy/delirium: Family denies history of dementia.  Seems he is having hospital delirium after severe sepsis.  Has a history of CVA.  No focal neurodeficit except for chronic RLE weakness.  CTH, B12, ammonia and RPR unrevealing.  No nuchal rigidity to suggest meningitis.  He is oriented to self and his family.  He is agitated at night requiring as needed meds and mittens. -Treat treatable causes as above -Reorientation and delirium precautions -Start low-dose Seroquel at night  Uncontrolled DM-2 with hyperglycemia: A1c 7.5%. Recent Labs  Lab 11/01/21 1151 11/01/21 1555 11/01/21 2019 11/02/21 0727 11/02/21 1136  GLUCAP 180* 127* 122* 125* 115*  -Increase SSI to sensitive -Add basal insulin at 5 units daily   Thrombocytopenia: Likely from sepsis and cephalosporin.  Seems to be improving. Recent Labs  Lab 10/28/21 1808 10/29/21 0521 10/29/21 1559 10/30/21 0117 10/30/21 0743 10/31/21 0116 11/01/21 0213 11/02/21 0131  PLT 101* 69* 73* 60* 53* 44* 47* 52*  -Continue monitoring -Management as above  7 mm LUL nodule.  Noted on CT chest. -Repeat CT chest in 6 to 12 months   Hyperkalemia: Likely due to AKI.  Resolved.   History of CVA: Stable. -Continue home aspirin and statin.   Gout: Uric acid 5.0>> 3.3. -On colchicine and allopurinol.  Left knee pain/tenderness/stiffness-likely arthritis.  X-ray without acute finding. -Voltaren gel  Language barrier affecting communication -Used video interpreter   Body mass index is 25.65 kg/m. Consult dietitian       DVT prophylaxis:  SCDs Start: 10/28/21 2054 due to thrombocytopenia  Code Status: Full  code Family Communication: Updated patient's son at bedside. Level of care: Progressive Status is: Inpatient  Remains inpatient appropriate because: Need for IV antibiotics for severe sepsis due to pneumonia, encephalopathy, ABLA, thrombocytopenia and safe disposition       Consultants:  Gastroenterology-following Cardiology-signed off   Sch Meds:  Scheduled Meds:  allopurinol  150 mg Oral Daily   colchicine  0.6 mg Oral Daily   diclofenac Sodium  2 g Topical QID   doxycycline  100 mg Oral Q12H   insulin aspart  0-5 Units Subcutaneous QHS   insulin aspart  0-9 Units Subcutaneous TID WC   insulin glargine-yfgn  5 Units Subcutaneous Daily   pantoprazole (PROTONIX) IV  40 mg Intravenous Q12H   QUEtiapine  25 mg Oral QHS   sodium zirconium cyclosilicate  10 g Oral Once   Continuous Infusions:  sodium chloride Stopped (10/28/21 1945)   ampicillin-sulbactam (UNASYN) IV 3 g (11/02/21 1225)   PRN Meds:.acetaminophen **OR** acetaminophen, metoprolol tartrate, traZODone  Antimicrobials: Anti-infectives (From admission, onward)    Start     Dose/Rate Route Frequency Ordered Stop   10/31/21 1300  doxycycline (VIBRA-TABS) tablet  100 mg        100 mg Oral Every 12 hours 10/31/21 1200 11/05/21 0959   10/31/21 1245  Ampicillin-Sulbactam (UNASYN) 3 g in sodium chloride 0.9 % 100 mL IVPB        3 g 200 mL/hr over 30 Minutes Intravenous Every 6 hours 10/31/21 1200 11/05/21 1259   10/30/21 1045  vancomycin (VANCOREADY) IVPB 1000 mg/200 mL  Status:  Discontinued        1,000 mg 200 mL/hr over 60 Minutes Intravenous Every 36 hours 10/30/21 0957 10/31/21 1200   10/30/21 1015  ceFEPIme (MAXIPIME) 2 g in sodium chloride 0.9 % 100 mL IVPB  Status:  Discontinued        2 g 200 mL/hr over 30 Minutes Intravenous Every 12 hours 10/30/21 1010 10/31/21 1200   10/29/21 2000  ceFEPIme (MAXIPIME) 2 g in sodium chloride 0.9 % 100 mL IVPB  Status:  Discontinued        2 g 200 mL/hr over 30 Minutes  Intravenous Every 24 hours 10/28/21 2011 10/30/21 1010   10/28/21 2004  vancomycin variable dose per unstable renal function (pharmacist dosing)  Status:  Discontinued         Does not apply See admin instructions 10/28/21 2004 10/31/21 1200   10/28/21 1945  ceFEPIme (MAXIPIME) 2 g in sodium chloride 0.9 % 100 mL IVPB        2 g 200 mL/hr over 30 Minutes Intravenous  Once 10/28/21 1940 10/28/21 2028   10/28/21 1945  metroNIDAZOLE (FLAGYL) IVPB 500 mg        500 mg 100 mL/hr over 60 Minutes Intravenous  Once 10/28/21 1940 10/28/21 2051   10/28/21 1945  vancomycin (VANCOREADY) IVPB 1500 mg/300 mL        1,500 mg 150 mL/hr over 120 Minutes Intravenous  Once 10/28/21 1940 10/28/21 2213        I have personally reviewed the following labs and images: CBC: Recent Labs  Lab 10/28/21 1808 10/29/21 0521 10/30/21 0117 10/30/21 0743 10/31/21 0116 11/01/21 0213 11/02/21 0131  WBC 7.1   < > 5.7 6.3 4.4 4.2 3.8*  NEUTROABS 3.9  --   --   --   --  1.7  --   HGB 6.4*   < > 7.6* 7.8* 8.0* 8.4* 8.3*  HCT 19.8*   < > 21.8* 23.3* 23.7* 24.3* 25.2*  MCV 99.0   < > 88.3 90.0 89.4 89.7 90.6  PLT 101*   < > 60* 53* 44* 47* 52*   < > = values in this interval not displayed.   BMP &GFR Recent Labs  Lab 10/29/21 0521 10/30/21 0117 10/31/21 0116 11/01/21 0213 11/02/21 0131  NA 137 135 135 135 135  K 5.1 4.1 4.0 3.9 3.7  CL 107 105 108 106 102  CO2 22 21* 21* 22 25  GLUCOSE 79 203* 250* 126* 120*  BUN 51* 36* 23 20 15   CREATININE 2.05* 1.50* 1.12 1.02 1.02  CALCIUM 8.6* 8.2* 8.1* 8.1* 7.9*  MG  --   --   --  1.4* 1.8  PHOS  --   --   --  2.7 3.3   Estimated Creatinine Clearance: 44.6 mL/min (by C-G formula based on SCr of 1.02 mg/dL). Liver & Pancreas: Recent Labs  Lab 10/28/21 1808 10/31/21 0116 11/01/21 0213 11/02/21 0131  AST 26 16 31   --   ALT 23 18 35  --   ALKPHOS 78 55 60  --  BILITOT 1.0 1.1 0.9  --   PROT 5.8* 4.5* 4.4*  --   ALBUMIN 2.8* 1.8* 1.8* 1.9*   No results  for input(s): LIPASE, AMYLASE in the last 168 hours. Recent Labs  Lab 11/01/21 0213  AMMONIA 17   Diabetic: No results for input(s): HGBA1C in the last 72 hours.  Recent Labs  Lab 11/01/21 1151 11/01/21 1555 11/01/21 2019 11/02/21 0727 11/02/21 1136  GLUCAP 180* 127* 122* 125* 115*   Cardiac Enzymes: Recent Labs  Lab 11/01/21 0213  CKTOTAL 27*   No results for input(s): PROBNP in the last 8760 hours. Coagulation Profile: Recent Labs  Lab 10/28/21 1808  INR 1.3*   Thyroid Function Tests: No results for input(s): TSH, T4TOTAL, FREET4, T3FREE, THYROIDAB in the last 72 hours. Lipid Profile: No results for input(s): CHOL, HDL, LDLCALC, TRIG, CHOLHDL, LDLDIRECT in the last 72 hours. Anemia Panel: No results for input(s): VITAMINB12, FOLATE, FERRITIN, TIBC, IRON, RETICCTPCT in the last 72 hours.  Urine analysis:    Component Value Date/Time   COLORURINE AMBER (A) 10/28/2021 1808   APPEARANCEUR HAZY (A) 10/28/2021 1808   LABSPEC 1.015 10/28/2021 1808   PHURINE 5.0 10/28/2021 1808   GLUCOSEU NEGATIVE 10/28/2021 1808   HGBUR NEGATIVE 10/28/2021 1808   BILIRUBINUR NEGATIVE 10/28/2021 1808   KETONESUR NEGATIVE 10/28/2021 1808   PROTEINUR NEGATIVE 10/28/2021 1808   NITRITE NEGATIVE 10/28/2021 1808   LEUKOCYTESUR NEGATIVE 10/28/2021 1808   Sepsis Labs: Invalid input(s): PROCALCITONIN, [place]  Microbiology: Recent Results (from the past 240 hour(s))  Blood Culture (routine x 2)     Status: None (Preliminary result)   Collection Time: 10/28/21  6:20 PM   Specimen: BLOOD LEFT HAND  Result Value Ref Range Status   Specimen Description BLOOD LEFT HAND  Final   Special Requests   Final    BOTTLES DRAWN AEROBIC ONLY Blood Culture adequate volume   Culture   Final    NO GROWTH 4 DAYS Performed at [facility], [address], [place], [place] [address]    Report Status PENDING  Incomplete  Blood Culture (routine x 2)     Status: None (Preliminary result)    Collection Time: 10/28/21  6:46 PM   Specimen: BLOOD LEFT ARM  Result Value Ref Range Status   Specimen Description BLOOD LEFT ARM  Final   Special Requests   Final    BOTTLES DRAWN AEROBIC AND ANAEROBIC Blood Culture adequate volume   Culture   Final    NO GROWTH 4 DAYS Performed at [facility], [place] [address] [place], [place] [address]    Report Status PENDING  Incomplete  Resp Panel by RT-PCR (Flu A&B, Covid) Nasopharyngeal Swab     Status: None   Collection Time: 10/28/21  6:48 PM   Specimen: Nasopharyngeal Swab; Nasopharyngeal(NP) swabs in vial transport medium  Result Value Ref Range Status   SARS Coronavirus 2 by RT PCR NEGATIVE NEGATIVE Final    Comment: (NOTE) SARS-CoV-2 target nucleic acids are NOT DETECTED.  The SARS-CoV-2 RNA is generally detectable in upper respiratory specimens during the acute phase of infection. The lowest concentration of SARS-CoV-2 viral copies this assay can detect is 138 copies/mL. A negative result does not preclude SARS-Cov-2 infection and should not be used as the sole basis for treatment or other patient management decisions. A negative result may occur with  improper specimen collection/handling, submission of specimen other than nasopharyngeal swab, presence of viral mutation(s) within the areas targeted by this  assay, and inadequate number of viral copies(<138 copies/mL). A negative result must be combined with clinical observations, patient history, and epidemiological information. The expected result is Negative.  Fact Sheet for Patients:  EntrepreneurPulse.com.au  Fact Sheet for Healthcare Providers:  IncredibleEmployment.be  This test is no t yet approved or cleared by the [place] FDA and  has been authorized for detection and/or diagnosis of SARS-CoV-2 by FDA under an Emergency Use Authorization (EUA). This EUA will remain  in effect (meaning this test can be used) for the  duration of the COVID-19 declaration under Section 564(b)(1) of the Act, 21 U.S.C.section 360bbb-3(b)(1), unless the authorization is terminated  or revoked sooner.       Influenza A by PCR NEGATIVE NEGATIVE Final   Influenza B by PCR NEGATIVE NEGATIVE Final    Comment: (NOTE) The Xpert Xpress SARS-CoV-2/FLU/RSV plus assay is intended as an aid in the diagnosis of influenza from Nasopharyngeal swab specimens and should not be used as a sole basis for treatment. Nasal washings and aspirates are unacceptable for Xpert Xpress SARS-CoV-2/FLU/RSV testing.  Fact Sheet for Patients: EntrepreneurPulse.com.au  Fact Sheet for Healthcare Providers: IncredibleEmployment.be  This test is not yet approved or cleared by the [place] FDA and has been authorized for detection and/or diagnosis of SARS-CoV-2 by FDA under an Emergency Use Authorization (EUA). This EUA will remain in effect (meaning this test can be used) for the duration of the COVID-19 declaration under Section 564(b)(1) of the Act, 21 U.S.C. section 360bbb-3(b)(1), unless the authorization is terminated or revoked.  Performed at [facility], [place] [address], [place], [place] [address]   Urine Culture     Status: None   Collection Time: 10/28/21  8:51 PM   Specimen: In/Out Cath Urine  Result Value Ref Range Status   Specimen Description IN/OUT CATH URINE  Final   Special Requests NONE  Final   Culture   Final    NO GROWTH Performed at [facility], [place] [address], [place], [place] [address]    Report Status 10/29/2021 FINAL  Final  MRSA Next Gen by PCR, Nasal     Status: None   Collection Time: 10/29/21  8:51 AM   Specimen: Nasal Mucosa; Nasal Swab  Result Value Ref Range Status   MRSA by PCR Next Gen NOT DETECTED NOT DETECTED Final    Comment: (NOTE) The GeneXpert MRSA Assay (FDA approved for NASAL specimens only), is one component of a comprehensive MRSA  colonization surveillance program. It is not intended to diagnose MRSA infection nor to guide or monitor treatment for MRSA infections. Test performance is not FDA approved in patients less than 19 years old. Performed at [facility], [place] [address], [place], [place] [address]     Radiology Studies: DG Knee 1-2 Views Right  Result Date: 11/01/2021 CLINICAL DATA:  Pain EXAM: RIGHT KNEE - 1-2 VIEW COMPARISON:  None. FINDINGS: Enthesopathic changes off the superior patella. No joint effusion. No fracture. No other acute abnormalities. IMPRESSION: Enthesopathic changes off the superior patella. No other abnormalities. Electronically Signed   By: Dorise Bullion III M.D.   On: 11/01/2021 16:26     Antavius Sperbeck [address] [place]  If 7PM-7AM, please contact night-coverage [email or web address] 11/02/2021, 1:45 PM

## 2021-11-02 NOTE — Progress Notes (Signed)
Initial Nutrition Assessment  DOCUMENTATION CODES:   Not applicable  INTERVENTION:  - Ensure Enlive po BID, each supplement provides 350 kcal and 20 grams of protein  NUTRITION DIAGNOSIS:   Increased nutrient needs related to acute illness as evidenced by estimated needs.  GOAL:   Patient will meet greater than or equal to 90% of their needs  MONITOR:   PO intake, Supplement acceptance, Labs, Weight trends  REASON FOR ASSESSMENT:   Consult Assessment of nutrition requirement/status  ASSESSMENT:   Pt admitted from home with AMS and labored respiration secondary to sepsis, new onset afib, and anemia. PMH includes CVA, LBBB, T2DM, hyperlipidemia.  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  Pt resting, in hand mittens and fidgety. Spoke with pt's granddaughter at bedside who provided history on pt. She states that he was previously at the hospital in [place] where he was on a clear liquid diet and has not had much PO intake within the past week. However she states that his appetite is getting better and is hopeful he will start to eat more now that he is on a regular diet. She reports that he went on a trip and when he came back 1 month ago he had a gout/arthritis flare and he stopped taking his medication and this is when his poor PO intake began. Upon restarting his medication, his PO intake was improving in addition to having added 1 Boost into his daily routine but unfortunately this is when he was admitted to the hospital.   She is unsure of his usual weight but has noticed significant weight loss. Per review of chart, pt has had a 14% weight loss since June 2022. Suspect pt has underlying malnutrition, however unable to obtain enough information  or physical exam at this time to diagnose.  Medications: doxycycline, SSI, semglee, protonix, IV abx  Labs:  HgbA1c 7.5%, CBG's: 122-180 x24 hours UOP: 1594mL x24 hours I/O's: +883mL since admission  NUTRITION - FOCUSED PHYSICAL EXAM: Deferred to follow-up d/t provider present and pt resting  Diet Order:   Diet Order             Diet regular Room service appropriate? No; Fluid consistency: Thin  Diet effective now                   EDUCATION NEEDS:   Not appropriate for education at this time  Skin:  Skin Assessment: Reviewed RN Assessment  Last BM:  11/02/21; type 1  Height:   Ht Readings from Last 1 Encounters:  10/29/21 5\' 2"  (1.575 m)    Weight:   Wt Readings from Last 1 Encounters:  10/29/21 63.6 kg   BMI:  Body mass index is 25.65 kg/m.  Estimated Nutritional Needs:   Kcal:  1700-1900  Protein:  85-95g  Fluid:  >1.7L  Clayborne Dana, RDN, LDN Clinical Nutrition

## 2021-11-02 NOTE — Progress Notes (Signed)
Physical Therapy Treatment Patient Details Name: Eugene Cooper MRN: [id number] DOB: 1941/04/12 Today's Date: 11/02/2021   History of Present Illness 80 yo admitted 11/20 with AMS, weakness, tachycardia, hyperkalemia and symptomatic anemia. EGD 11/21. PMhx: DM, CVA, HTN, HLD, LBBB, gout    PT Comments    Pt restless and holding head upon arrival to room, states his body and legs hurt. Pt agreeable to sitting EOB, when PT began assisting pt to EOB pt resistant at times but sat EOB unsupported x5 minutes. Pt requesting return to supine given severe LE pain, would not tolerate LE ROM or exercises given pain. Pt also saturated in urine, PT assisted RN staff with clean up and linen change. PT strongly recommending ST-SNF, spoke with pt's granddaughter and daughter at bedside who express interest in this but evidently one family member refused this. CSM messaged.     Recommendations for follow up therapy are one component of a multi-disciplinary discharge planning process, led by the attending physician.  Recommendations may be updated based on patient status, additional functional criteria and insurance authorization.  Follow Up Recommendations  Skilled nursing-short term rehab (<3 hours/day)     Assistance Recommended at Discharge Frequent or constant Supervision/Assistance  Equipment Recommendations  None recommended by PT    Recommendations for Other Services       Precautions / Restrictions Precautions Precautions: Fall Precaution Comments: bil knee pain, gout Restrictions Weight Bearing Restrictions: No     Mobility  Bed Mobility Overal bed mobility: Needs Assistance Bed Mobility: Supine to Sit;Sit to Supine;Rolling Rolling: Max assist;+2 for physical assistance   Supine to sit: Total assist Sit to supine: Total assist   General bed mobility comments: total assist for moving to/from EOB for all aspects, pt not assisting and at times resisting due to bilat LE pain. max  +2 assist for rolling bilat for pericare, pt soiled in urine.    Transfers                   General transfer comment: Pt declines stand attempt, requests return to supine.    Ambulation/Gait                   Stairs             Wheelchair Mobility    Modified Rankin (Stroke Patients Only)       Balance Overall balance assessment: Needs assistance;History of Falls Sitting-balance support: Feet supported;Bilateral upper extremity supported Sitting balance-Leahy Scale: Poor                                      Cognition Arousal/Alertness: Awake/alert Behavior During Therapy: Flat affect Overall Cognitive Status: Impaired/Different from baseline Area of Impairment: Memory;Following commands;Safety/judgement;Attention                 Orientation Level: Disoriented to;Time;Situation;Place Current Attention Level: Focused   Following Commands: Follows one step commands inconsistently;Follows one step commands with increased time Safety/Judgement: Decreased awareness of safety;Decreased awareness of deficits   Problem Solving: Slow processing;Decreased initiation;Requires verbal cues;Requires tactile cues;Difficulty sequencing General Comments: Pt with very little verbalization during session, only stating he is hurting in his legs. Requires max cuing and physical assist for all mobility, resistant to rolling for pericare        Exercises      General Comments General comments (skin integrity, edema, etc.): Pt's granddaughter declines interpreter, wishes to assist  pt during session      Pertinent Vitals/Pain Pain Assessment: Faces Faces Pain Scale: Hurts whole lot Pain Location: bilat knees Pain Descriptors / Indicators: Sore;Discomfort;Grimacing;Guarding Pain Intervention(s): Limited activity within patient's tolerance;Monitored during session;Repositioned;Relaxation    Home Living                           Prior Function            PT Goals (current goals can now be found in the care plan section) Acute Rehab PT Goals Patient Stated Goal: be able to walk and return home PT Goal Formulation: With patient/family Time For Goal Achievement: 11/13/21 Potential to Achieve Goals: Fair Progress towards PT goals: Progressing toward goals    Frequency    Min 3X/week (pt's son is refusing SNF, other family members interested in SNF)      PT Plan Current plan remains appropriate    Co-evaluation              AM-PAC PT "6 Clicks" Mobility   Outcome Measure  Help needed turning from your back to your side while in a flat bed without using bedrails?: A Lot Help needed moving from lying on your back to sitting on the side of a flat bed without using bedrails?: Total Help needed moving to and from a bed to a chair (including a wheelchair)?: Total Help needed standing up from a chair using your arms (e.g., wheelchair or bedside chair)?: Total Help needed to walk in hospital room?: Total Help needed climbing 3-5 steps with a railing? : Total 6 Click Score: 7    End of Session Equipment Utilized During Treatment: Gait belt Activity Tolerance: Patient limited by fatigue Patient left: in bed;with call bell/phone within reach;with family/visitor present;with bed alarm set Nurse Communication: Mobility status PT Visit Diagnosis: Other abnormalities of gait and mobility (R26.89);Difficulty in walking, not elsewhere classified (R26.2);Muscle weakness (generalized) (M62.81);History of falling (Z91.81)     Time: 7939-0300 PT Time Calculation (min) (ACUTE ONLY): 18 min  Charges:  $Therapeutic Activity: 8-22 mins                    Stacie Glaze, PT DPT Acute Rehabilitation Services Pager [phone]  Office [phone]    Roxine Caddy E Ruffin Pyo 11/02/2021, 3:15 PM

## 2021-11-02 NOTE — TOC Initial Note (Addendum)
Transition of Care [facility]) - Initial/Assessment Note    Patient Details  Name: Eugene Cooper MRN: [id number] Date of Birth: 16-Jul-1941  Transition of Care [facility] CM/SW Contact:    Cyndi Bender, RN Phone Number: 11/02/2021, 1:12 PM  Clinical Narrative:                 Orders for Home Health-PT/OT/RN. Spoke to son regarding transition needs.Choice offered with list provided per CMS guidelines from medicare.gov website with star ratings  Son defers to [facility] to find [facility] agency. Spoke to Violet with [facility] and referral accepted for HH-RN/PT/OT.  [place]ana Hoyos to [place], Conservator, museum/gallery, and she is going to speak to patient's siblings and to have them decide if SNF or HH is best for the patient. She states the patient doesn't have 24 hour care at home.   Expected Discharge Plan: [place] Barriers to Discharge: Continued Medical Work up   Patient Goals and CMS Choice Patient states their goals for this hospitalization and ongoing recovery are:: return home CMS Medicare.gov Compare Post Acute Care list provided to:: Patient Represenative (must comment) Choice offered to / list presented to : Adult Children  Expected Discharge Plan and Services Expected Discharge Plan: [place]   Discharge Planning Services: CM Consult Post Acute Care Choice: [place] arrangements for the past 2 months: [place] Arranged: RN, PT, OT [place] Agency: [place] Date [facility] Agency Contacted: 11/02/21 Time HH Agency Contacted: 70 Representative spoke with at [place]: [place] Arrangements/Services Living arrangements for the past 2 months: [place] with:: Adult Children Patient language and need for interpreter reviewed:: Yes Do you feel safe going back to the place where you live?: Yes      Need for Family Participation in Patient Care: Yes (Comment) Care giver support system  in place?: Yes (comment) Current home services: DME (has walker and cane) Criminal Activity/Legal Involvement Pertinent to Current Situation/Hospitalization: No - Comment as needed  Activities of Daily Living Home Assistive Devices/Equipment: Dentures (specify type), Walker (specify type), Wheelchair (upper dentures) ADL Screening (condition at time of admission) Patient's cognitive ability adequate to safely complete daily activities?: Yes Is the patient deaf or have difficulty hearing?: No Does the patient have difficulty seeing, even when wearing glasses/contacts?: No Does the patient have difficulty concentrating, remembering, or making decisions?: No Patient able to express need for assistance with ADLs?: Yes Does the patient have difficulty dressing or bathing?: Yes Independently performs ADLs?: No Communication: Independent Dressing (OT): Needs assistance Grooming: Needs assistance Feeding: Independent Bathing: Needs assistance Toileting: Needs assistance In/Out Bed: Needs assistance Walks in Home: Needs assistance Does the patient have difficulty walking or climbing stairs?: Yes Weakness of Legs: Both Weakness of Arms/Hands: Both  Permission Sought/Granted Permission sought to share information with : Chartered certified accountant granted to share information with : Yes, Verbal Permission Granted     Permission granted to share info w AGENCY: home health        Emotional Assessment         Alcohol / Substance Use: Not Applicable Psych Involvement: No (comment)  Admission diagnosis:  Acute blood loss anemia [D62] AKI (acute kidney injury) ([place]) [N17.9] Symptomatic anemia [D64.9] Altered mental status, unspecified altered mental status type [R41.82] Sepsis with  acute renal failure and septic shock, due to unspecified organism, unspecified acute renal failure type ([place]) [A41.9, R65.21, N17.9] Patient Active Problem List   Diagnosis Date Noted    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    Altered mental status    Acute blood loss anemia 10/28/2021   Hypotension 10/28/2021   Atrial fibrillation with RVR ([place]) 10/28/2021   Hyperkalemia 88/41/6606   Metabolic acidosis, increased anion gap (IAG) 30/16/0109   Acute metabolic encephalopathy 32/35/5732   AKI (acute kidney injury) ([place]) 10/28/2021   History of stroke 10/28/2021   Pain in left shoulder 06/04/2021   Gout 06/04/2021   Elevated BUN    Benign essential HTN    Controlled type 2 diabetes mellitus with hyperglycemia, without long-term current use of insulin (HCC)    Labile blood glucose    Neuropathic pain    Acute ischemic stroke ([place]) 12/22/2019   Uncontrolled type 2 diabetes mellitus with hyperglycemia, without long-term current use of insulin ([place]) 12/22/2019   Mixed hyperlipidemia 12/22/2019   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

## 2021-11-03 DIAGNOSIS — M25562 Pain in left knee: Secondary | ICD-10-CM

## 2021-11-03 DIAGNOSIS — R4182 Altered mental status, unspecified: Secondary | ICD-10-CM

## 2021-11-03 DIAGNOSIS — G9341 Metabolic encephalopathy: Secondary | ICD-10-CM | POA: Diagnosis not present

## 2021-11-03 DIAGNOSIS — D649 Anemia, unspecified: Secondary | ICD-10-CM

## 2021-11-03 DIAGNOSIS — N179 Acute kidney failure, unspecified: Secondary | ICD-10-CM

## 2021-11-03 DIAGNOSIS — R6520 Severe sepsis without septic shock: Secondary | ICD-10-CM

## 2021-11-03 DIAGNOSIS — D62 Acute posthemorrhagic anemia: Secondary | ICD-10-CM | POA: Diagnosis not present

## 2021-11-03 DIAGNOSIS — E875 Hyperkalemia: Secondary | ICD-10-CM

## 2021-11-03 DIAGNOSIS — Z8673 Personal history of transient ischemic attack (TIA), and cerebral infarction without residual deficits: Secondary | ICD-10-CM

## 2021-11-03 DIAGNOSIS — M109 Gout, unspecified: Secondary | ICD-10-CM

## 2021-11-03 DIAGNOSIS — I4891 Unspecified atrial fibrillation: Secondary | ICD-10-CM

## 2021-11-03 DIAGNOSIS — R911 Solitary pulmonary nodule: Secondary | ICD-10-CM

## 2021-11-03 DIAGNOSIS — K259 Gastric ulcer, unspecified as acute or chronic, without hemorrhage or perforation: Secondary | ICD-10-CM

## 2021-11-03 DIAGNOSIS — D61818 Other pancytopenia: Secondary | ICD-10-CM

## 2021-11-03 DIAGNOSIS — K3189 Other diseases of stomach and duodenum: Secondary | ICD-10-CM

## 2021-11-03 DIAGNOSIS — M25569 Pain in unspecified knee: Secondary | ICD-10-CM

## 2021-11-03 DIAGNOSIS — R6521 Severe sepsis with septic shock: Secondary | ICD-10-CM

## 2021-11-03 DIAGNOSIS — Z789 Other specified health status: Secondary | ICD-10-CM

## 2021-11-03 DIAGNOSIS — R5381 Other malaise: Secondary | ICD-10-CM

## 2021-11-03 DIAGNOSIS — A419 Sepsis, unspecified organism: Principal | ICD-10-CM

## 2021-11-03 DIAGNOSIS — E861 Hypovolemia: Secondary | ICD-10-CM

## 2021-11-03 DIAGNOSIS — J189 Pneumonia, unspecified organism: Secondary | ICD-10-CM

## 2021-11-03 DIAGNOSIS — J181 Lobar pneumonia, unspecified organism: Secondary | ICD-10-CM

## 2021-11-03 DIAGNOSIS — I959 Hypotension, unspecified: Secondary | ICD-10-CM

## 2021-11-03 DIAGNOSIS — I9589 Other hypotension: Secondary | ICD-10-CM

## 2021-11-03 DIAGNOSIS — M25561 Pain in right knee: Secondary | ICD-10-CM

## 2021-11-03 DIAGNOSIS — R509 Fever, unspecified: Secondary | ICD-10-CM

## 2021-11-03 DIAGNOSIS — D696 Thrombocytopenia, unspecified: Secondary | ICD-10-CM

## 2021-11-03 LAB — GLUCOSE, CAPILLARY
Glucose-Capillary: 108 mg/dL — ABNORMAL HIGH (ref 70–99)
Glucose-Capillary: 180 mg/dL — ABNORMAL HIGH (ref 70–99)
Glucose-Capillary: 144 mg/dL — ABNORMAL HIGH (ref 70–99)
Glucose-Capillary: 186 mg/dL — ABNORMAL HIGH (ref 70–99)

## 2021-11-03 LAB — URINALYSIS, ROUTINE W REFLEX MICROSCOPIC
Bacteria, UA: NONE SEEN
Bilirubin Urine: NEGATIVE
Glucose, UA: NEGATIVE mg/dL
Ketones, ur: NEGATIVE mg/dL
Leukocytes,Ua: NEGATIVE
Nitrite: NEGATIVE
Protein, ur: NEGATIVE mg/dL
Specific Gravity, Urine: 1.016 (ref 1.005–1.030)
pH: 7 (ref 5.0–8.0)

## 2021-11-03 LAB — PROCALCITONIN: Procalcitonin: 0.21 ng/mL

## 2021-11-03 LAB — RENAL FUNCTION PANEL
Albumin: 1.8 g/dL — ABNORMAL LOW (ref 3.5–5.0)
Anion gap: 6 (ref 5–15)
BUN: 17 mg/dL (ref 8–23)
CO2: 24 mmol/L (ref 22–32)
Calcium: 7.8 mg/dL — ABNORMAL LOW (ref 8.9–10.3)
Chloride: 105 mmol/L (ref 98–111)
Creatinine, Ser: 1.04 mg/dL (ref 0.61–1.24)
GFR, Estimated: >60 mL/min (ref >60)
Glucose, Bld: 142 mg/dL — ABNORMAL HIGH (ref 70–99)
Phosphorus: 3.8 mg/dL (ref 2.5–4.6)
Potassium: 3.6 mmol/L (ref 3.5–5.1)
Sodium: 135 mmol/L (ref 135–145)

## 2021-11-03 LAB — CBC
HCT: 22.7 % — ABNORMAL LOW (ref 39.0–52.0)
Hemoglobin: 7.8 g/dL — ABNORMAL LOW (ref 13.0–17.0)
MCH: 31.1 pg (ref 26.0–34.0)
MCHC: 34.4 g/dL (ref 30.0–36.0)
MCV: 90.4 fL (ref 80.0–100.0)
Platelets: 46 10*3/uL — ABNORMAL LOW (ref 150–400)
RBC: 2.51 MIL/uL — ABNORMAL LOW (ref 4.22–5.81)
RDW: 15.5 % (ref 11.5–15.5)
WBC: 3 10*3/uL — ABNORMAL LOW (ref 4.0–10.5)
nRBC: 0 % (ref 0.0–0.2)

## 2021-11-03 LAB — MAGNESIUM: Magnesium: 1.6 mg/dL — ABNORMAL LOW (ref 1.7–2.4)

## 2021-11-03 MED ORDER — MAGNESIUM SULFATE 2 GM/50ML IV SOLN
2.0000 g | Freq: Once | INTRAVENOUS | Status: AC
  Administered 2021-11-03: 2 g via INTRAVENOUS
  Filled 2021-11-03: qty 50

## 2021-11-03 MED ORDER — COLCHICINE 0.6 MG PO TABS
0.6000 mg | ORAL_TABLET | Freq: Two times a day (BID) | ORAL | Status: DC
  Administered 2021-11-04 – 2021-11-08 (×9): 0.6 mg via ORAL
  Filled 2021-11-03 (×9): qty 1

## 2021-11-03 MED ORDER — COLCHICINE 0.6 MG PO TABS
1.2000 mg | ORAL_TABLET | Freq: Once | ORAL | Status: AC
  Administered 2021-11-03: 1.2 mg via ORAL
  Filled 2021-11-03: qty 2

## 2021-11-03 NOTE — Consult Note (Signed)
Date of Admission:  10/28/2021          Reason for Consult: FUO    Referring Provider: Wendee Beavers, MD   Assessment:  Fever of unknown origin Possible upper lobe and left lower lobe pneumonia Delirium superimposed potentially on undiagnosed dementia Atrial fibrillation with RVR History of a stroke History of gout Pain in multiple joints including knees calves ankles concerning for flare of #6   Plan:  I am increasing the dose of colchicine to treat for possible gout  Will check sed rate CRP uric acid Will stop his antibacterial antibotics  I will follow-up with the patient tomorrow to check on how he is doing  Principal Problem:   Acute blood loss anemia Active Problems:   Controlled type 2 diabetes mellitus with hyperglycemia, without long-term current use of insulin (HCC)   Hypotension   Atrial fibrillation with RVR (HCC)   Hyperkalemia   Metabolic acidosis, increased anion gap (IAG)   Acute metabolic encephalopathy   AKI (acute kidney injury) ([place])   History of stroke   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   Altered mental status   Scheduled Meds:  allopurinol  150 mg Oral Daily   [START ON 11/04/2021] colchicine  0.6 mg Oral BID   diclofenac Sodium  2 g Topical QID   doxycycline  100 mg Oral Q12H   insulin aspart  0-5 Units Subcutaneous QHS   insulin aspart  0-9 Units Subcutaneous TID WC   insulin glargine-yfgn  5 Units Subcutaneous Daily   pantoprazole (PROTONIX) IV  40 mg Intravenous Q12H   QUEtiapine  25 mg Oral QHS   Continuous Infusions:  sodium chloride Stopped (10/28/21 1945)   ampicillin-sulbactam (UNASYN) IV 3 g (11/03/21 1255)   PRN Meds:.acetaminophen **OR** acetaminophen, ibuprofen, metoprolol tartrate, traZODone  HPI: Eugene Cooper is a 80 y.o. male Spanish-speaking man with history of stroke left bundle branch block diabetes mellitus chronic kidney disease hyperlipidemia who presented with confusion and labored breathing  poor oral intake.  He was found to be profoundly anemic and in need of a blood transfusion by his primary care physician patient also had AKI and hyperkalemia with low blood pressures.  His hemoglobin the ER was as low as 6.4.  Blood cultures were taken imaging was done including a CT of the abdomen pelvis and CT chest.  Only significant finding on imaging was question of left upper lobe and left lower lobe pneumonia.  Patient was on very broad-spectrum antibiotics in the form of vancomycin and metronidazole and cefepime then vancomycin and cefepime through the 23rd at which point in time he was changed over to Unasyn with doxycycline.  He underwent EGD which showed evidence of gastritis.  He has continued to be confused and occasionally agitated.  Yesterday developed a fever up to 102.3 degrees.  In talking with him today at the bedside his grandson (I believe) and heard almost all the other questions along with other family members.  They tell me that the reason he came to the hospital was for the blood transfusion.  In terms of focal complaints the patient is complaining and has been complaining apparently of pain in his ankles calves and knees.  He has no evidence of an effusion or erythema in any of the sites I do not palpate any cords either.  He does have a history of gout and certainly I wonder if gout might be potentially playing a role in his fevers and his current musculoskeletal  pain.  He has received adequate antibiotics for pneumonia and his antibacterial antibiotic should be able to be stopped.  I spent 110 minutes with the patient including than 50% of the time in face to face counseling of the patients surrogates with family members at the bedside regarding nature of FUO nature of pneumonia the he has been treated for his GI bleed along with personally reviewing CT chest abdomen pelvis chest x-ray blood cultures urine culture, CBC CMP, along  with review of medical records in  preparation for the visit and during the visit and in coordination of his care.    Review of Systems: Review of Systems  Unable to perform ROS: Dementia   Past Medical History:  Diagnosis Date   CKD (chronic kidney disease), stage III (HCC)    Diabetes ([place])    Hyperlipemia    Hypertension    LBBB (left bundle branch block)    Stroke ([place])     Social History   Tobacco Use   Smoking status: Never   Smokeless tobacco: Never  Vaping Use   Vaping Use: Never used  Substance Use Topics   Alcohol use: Not Currently   Drug use: Never    Family History  Problem Relation Age of Onset   Stroke Mother    Lung disease Father    Diabetes Sister    Heart disease Sister    Hyperlipidemia Son    Hyperlipidemia Daughter    Hypertension Daughter    No Known Allergies  OBJECTIVE: Blood pressure 99/61, pulse 92, temperature 98.7 F (37.1 C), temperature source Oral, resp. rate 18, height 5\' 2"  (1.575 m), weight 63.6 kg, SpO2 94 %.  Physical Exam Constitutional:      Appearance: He is well-developed.  HENT:     Head: Normocephalic and atraumatic.  Eyes:     Conjunctiva/sclera: Conjunctivae normal.  Cardiovascular:     Rate and Rhythm: Normal rate and regular rhythm.     Heart sounds: No murmur heard.   No friction rub. No gallop.  Pulmonary:     Effort: Pulmonary effort is normal. No respiratory distress.     Breath sounds: Normal breath sounds. No stridor. No wheezing or rhonchi.  Abdominal:     General: There is no distension.     Palpations: Abdomen is soft. There is no mass.  Musculoskeletal:        General: No swelling. Normal range of motion.     Cervical back: Normal range of motion and neck supple.     Right knee: No swelling, deformity, erythema, ecchymosis or lacerations. Normal range of motion. Tenderness present.     Left knee: No swelling, deformity, effusion, erythema or ecchymosis. Normal range of motion. Tenderness present.     Right lower leg: Tenderness  present. No deformity or lacerations.     Left lower leg: Tenderness present. No deformity or lacerations.     Right ankle: No swelling, deformity, ecchymosis or lacerations. Tenderness present. Normal range of motion.     Left ankle: No swelling, ecchymosis or lacerations. Tenderness present. Normal range of motion.  Skin:    General: Skin is warm and dry.     Coloration: Skin is not pale.     Findings: No erythema or rash.  Neurological:     General: No focal deficit present.     Mental Status: He is alert.  Psychiatric:        Attention and Perception: He is inattentive.  Mood and Affect: Mood normal.        Speech: Speech is delayed.        Behavior: Behavior normal.        Thought Content: Thought content normal.        Cognition and Memory: Cognition is impaired. Memory is impaired. He exhibits impaired recent memory and impaired remote memory.        Judgment: Judgment normal.    Lab Results Lab Results  Component Value Date   WBC 3.0 (L) 11/03/2021   HGB 7.8 (L) 11/03/2021   HCT 22.7 (L) 11/03/2021   MCV 90.4 11/03/2021   PLT 46 (L) 11/03/2021    Lab Results  Component Value Date   CREATININE 1.04 11/03/2021   BUN 17 11/03/2021   NA 135 11/03/2021   K 3.6 11/03/2021   CL 105 11/03/2021   CO2 24 11/03/2021    Lab Results  Component Value Date   ALT 35 11/01/2021   AST 31 11/01/2021   ALKPHOS 60 11/01/2021   BILITOT 0.9 11/01/2021     Microbiology: Recent Results (from the past 240 hour(s))  Blood Culture (routine x 2)     Status: None   Collection Time: 10/28/21  6:20 PM   Specimen: BLOOD LEFT HAND  Result Value Ref Range Status   Specimen Description BLOOD LEFT HAND  Final   Special Requests   Final    BOTTLES DRAWN AEROBIC ONLY Blood Culture adequate volume   Culture   Final    NO GROWTH 5 DAYS Performed at [facility], [place] [address], [place], [place] [address]    Report Status 11/02/2021 FINAL  Final  Blood Culture (routine x 2)      Status: None   Collection Time: 10/28/21  6:46 PM   Specimen: BLOOD LEFT ARM  Result Value Ref Range Status   Specimen Description BLOOD LEFT ARM  Final   Special Requests   Final    BOTTLES DRAWN AEROBIC AND ANAEROBIC Blood Culture adequate volume   Culture   Final    NO GROWTH 5 DAYS Performed at [facility], [place] [address], [place], [place] [address]    Report Status 11/02/2021 FINAL  Final  Resp Panel by RT-PCR (Flu A&B, Covid) Nasopharyngeal Swab     Status: None   Collection Time: 10/28/21  6:48 PM   Specimen: Nasopharyngeal Swab; Nasopharyngeal(NP) swabs in vial transport medium  Result Value Ref Range Status   SARS Coronavirus 2 by RT PCR NEGATIVE NEGATIVE Final    Comment: (NOTE) SARS-CoV-2 target nucleic acids are NOT DETECTED.  The SARS-CoV-2 RNA is generally detectable in upper respiratory specimens during the acute phase of infection. The lowest concentration of SARS-CoV-2 viral copies this assay can detect is 138 copies/mL. A negative result does not preclude SARS-Cov-2 infection and should not be used as the sole basis for treatment or other patient management decisions. A negative result may occur with  improper specimen collection/handling, submission of specimen other than nasopharyngeal swab, presence of viral mutation(s) within the areas targeted by this assay, and inadequate number of viral copies(<138 copies/mL). A negative result must be combined with clinical observations, patient history, and epidemiological information. The expected result is Negative.  Fact Sheet for Patients:  EntrepreneurPulse.com.au  Fact Sheet for Healthcare Providers:  IncredibleEmployment.be  This test is no t yet approved or cleared by the [place] FDA and  has been authorized for detection and/or diagnosis of SARS-CoV-2 by FDA under  an Emergency Use Authorization (EUA). This EUA will remain  in effect (meaning this test can be  used) for the duration of the COVID-19 declaration under Section 564(b)(1) of the Act, 21 U.S.C.section 360bbb-3(b)(1), unless the authorization is terminated  or revoked sooner.       Influenza A by PCR NEGATIVE NEGATIVE Final   Influenza B by PCR NEGATIVE NEGATIVE Final    Comment: (NOTE) The Xpert Xpress SARS-CoV-2/FLU/RSV plus assay is intended as an aid in the diagnosis of influenza from Nasopharyngeal swab specimens and should not be used as a sole basis for treatment. Nasal washings and aspirates are unacceptable for Xpert Xpress SARS-CoV-2/FLU/RSV testing.  Fact Sheet for Patients: EntrepreneurPulse.com.au  Fact Sheet for Healthcare Providers: IncredibleEmployment.be  This test is not yet approved or cleared by the [place] FDA and has been authorized for detection and/or diagnosis of SARS-CoV-2 by FDA under an Emergency Use Authorization (EUA). This EUA will remain in effect (meaning this test can be used) for the duration of the COVID-19 declaration under Section 564(b)(1) of the Act, 21 U.S.C. section 360bbb-3(b)(1), unless the authorization is terminated or revoked.  Performed at [facility], [place] [address], [place], [place] [address]   Urine Culture     Status: None   Collection Time: 10/28/21  8:51 PM   Specimen: In/Out Cath Urine  Result Value Ref Range Status   Specimen Description IN/OUT CATH URINE  Final   Special Requests NONE  Final   Culture   Final    NO GROWTH Performed at [facility], [place] [address], [place], [place] [address]    Report Status 10/29/2021 FINAL  Final  MRSA Next Gen by PCR, Nasal     Status: None   Collection Time: 10/29/21  8:51 AM   Specimen: Nasal Mucosa; Nasal Swab  Result Value Ref Range Status   MRSA by PCR Next Gen NOT DETECTED NOT DETECTED Final    Comment: (NOTE) The GeneXpert MRSA Assay (FDA approved for NASAL specimens only), is one component of a comprehensive  MRSA colonization surveillance program. It is not intended to diagnose MRSA infection nor to guide or monitor treatment for MRSA infections. Test performance is not FDA approved in patients less than 82 years old. Performed at [facility], [place] [address] [place], [place] [address]     Alcide Evener, [place] [facility] [place] [facility] [phone] pager  11/03/2021, 2:37 PM

## 2021-11-03 NOTE — Progress Notes (Signed)
PROGRESS NOTE  Eugene Cooper HQI:[id number] DOB: 03/16/1941   PCP: Emelia Loron, NP  Patient is from: Home.  Lives with family.  Uses rolling walker at baseline.  DOA: 10/28/2021 LOS: 6  Chief complaints:  Chief Complaint  Patient presents with   Altered Mental Status     Brief Narrative / Interim history: 80 year old Spanish-speaking male with PMH of CVA, LBBB, DM-2, CKD-3A, HLD and ambulatory dysfunction presenting with altered mental status, labored breathing, poor p.o. intake and bilateral lower extremity pain, and admitted for acute blood loss anemia, sinus tachycardia, AKI, hyperkalemia and severe sepsis due to LLL pneumonia.  Initially concern about A. fib which was ruled out by cardiology.  Hgb 6.4.  He Hemoccult was positive.  EGD revealed gastritis. Cr 3.5.  K6.2.  CXR with right suprahilar nodular opacity.  CTH negative.  CT abdomen and pelvis raises concern for LLL consolidation.  CT chest with patchy consolidation with air and LLL and LUL, 7 mm LUL nodule.    Patient was transfused 1 unit with appropriate response.  H&H stable.  He was started on IV vancomycin and cefepime but then de-escalated to IV Unasyn and doxycycline.  Main issue is his metabolic encephalopathy/confusion with intermittent agitation. Spiked fever to 102.3 with tachycardia, hypotension and leukopenia the evening of 11/25.  ID consulted   Subjective: Seen and examined earlier this morning with the help of video interpreter.  Patient was sleeping this morning.  Patient's son at bedside.  He wakes to voice easily.  Reports feeling "much better".  However, is not able to elaborate what symptoms are better.  He is oriented to self, his son and "hospital".  Follows commands.  Responds no to pain  Objective: Vitals:   11/03/21 0000 11/03/21 0200 11/03/21 0400 11/03/21 0743  BP: (!) 84/57 (!) 88/59 (!) 82/56 120/79  Pulse: 73 69 67 66  Resp: 17 16 14 13   Temp:   97.7 F (36.5 C) 97.7 F (36.5 C)   TempSrc:   Axillary Oral  SpO2: 95% 94% 94% 97%  Weight:      Height:        Intake/Output Summary (Last 24 hours) at 11/03/2021 1129 Last data filed at 11/03/2021 0425 Gross per 24 hour  Intake 309.67 ml  Output 1050 ml  Net -740.33 ml   Filed Weights   10/29/21 1345  Weight: 63.6 kg    Examination:  GENERAL: No apparent distress.  Nontoxic. HEENT: MMM.  Vision and hearing grossly intact.  NECK: Supple.  No apparent JVD.  RESP: 97% on RA.  No IWOB.  Fair aeration bilaterally. CVS:  RRR. Heart sounds normal.  ABD/GI/GU: BS+. Abd soft, NTND.  MSK/EXT:  Moves extremities.  Significant muscle mass and subcu fat loss. SKIN: no apparent skin lesion or wound NEURO: Sleepy but wakes to voice.  Oriented to self, his son and "hospital".  No apparent focal neuro deficit other than BLE weakness more pronounced in RLE PSYCH: Calm. Normal affect.   Procedures:  11/31-EGD with erosive gastropathy and gastritis but no signs of bleeding.  Microbiology summarized: 11/20-COVID-19 and influenza PCR nonreactive. 11/21-MRSA PCR nonreactive. 11/20-blood culture and urine culture NGTD.  Assessment & Plan: Severe sepsis due to pneumonia: POA.  Heart tachycardia, tachypnea, AKI, AMS and lactic acidosis to 6.8.  Culture data as above.  CT C/A/P concerning for LUL and LLL consolidation.  Pro-Cal elevated but improving.  Spiked fever with tachycardia, hypotension and leukopenia on 11/25. -11/20-Vanco and cefepime>> 11/23 Unasyn and doxycycline>> -Repeat  blood culture, urinalysis and urine culture -Infectious disease consulted. -IS/OOB/PT/OT  ABLA/IDA/GI bleed/erosive gastropathy/gastritis: Hgb 14.6 in 12/2019.  Hemoccult positive.  EGD with erosive gastropathy and gastritis.  H&H stable after 1 unit. Recent Labs    10/28/21 1808 10/29/21 0521 10/29/21 1559 10/30/21 0117 10/30/21 0743 10/31/21 0116 11/01/21 0213 11/02/21 0131 11/03/21 0130  HGB 6.4* 8.5* 8.6* 7.6* 7.8* 8.0* 8.4* 8.3*  7.8*  -Monitor H&H and platelets. -P.o. Protonix 40 mg twice daily for 8 weeks -GI following-colonoscopy when able to drink prep.   Hypotension/sinus tachycardia: Likely due to severe sepsis. TTE with LVEF of 50 to 55% and RVSP of 31 mmHg but no other significant finding.  Resolved this morning. -Discontinue IV fluid   AKI/azotemia on CKD-3A: Could be prerenal and ATN from diuretics, NSAID and sepsis.  Resolved Recent Labs    10/28/21 1808 10/28/21 2039 10/29/21 0521 10/30/21 0117 10/31/21 0116 11/01/21 0213 11/02/21 0131 11/03/21 0130  BUN 65* 58* 51* 36* 23 20 15 17   CREATININE 3.54* 2.68* 2.05* 1.50* 1.12 1.02 1.02 1.04  -Discontinue IV fluid  Acute metabolic encephalopathy/delirium: Family denies history of dementia.  Seems he is having hospital delirium after severe sepsis.  Has a history of CVA.  No focal neurodeficit except for chronic RLE weakness.  CTH, B12, ammonia and RPR unrevealing.  No nuchal rigidity to suggest meningitis.  He is oriented to self and his family.  He is agitated at night requiring as needed meds and mittens. -Treat treatable causes as above -Reorientation and delirium precautions -Start low-dose Seroquel at night  Uncontrolled DM-2 with hyperglycemia: A1c 7.5%. Recent Labs  Lab 11/02/21 0727 11/02/21 1136 11/02/21 1555 11/02/21 2129 11/03/21 0746  GLUCAP 125* 115* 168* 141* 108*  -Increase SSI to sensitive -Add basal insulin at 5 units daily   Thrombocytopenia: Likely from sepsis and cephalosporin.  Low but relatively stable. Recent Labs  Lab 10/28/21 1808 10/29/21 0521 10/29/21 1559 10/30/21 0117 10/30/21 0743 10/31/21 0116 11/01/21 0213 11/02/21 0131 11/03/21 0130  PLT 101* 69* 73* 60* 53* 44* 47* 52* 46*  -Continue monitoring -Management as above  7 mm LUL nodule.  Noted on CT chest. -Repeat CT chest in 6 to 12 months   Hyperkalemia: Likely due to AKI.  Resolved.  Hypomagnesemia: Mg 1.6. -IV magnesium sulfate 2 g x 1.    History of CVA: Stable. -Continue home aspirin and statin.   Gout: Uric acid 5.0>> 3.3. -On colchicine and allopurinol.  Left knee pain/tenderness/stiffness-likely arthritis.  X-ray without acute finding. -Voltaren gel  Language barrier affecting communication -Used video interpreter   Pancytopenia: Due to acute illness? -Continue monitoring -Hematology consult if worse  Body mass index is 25.65 kg/m. Consult dietitian Signs/Symptoms: estimated needs Interventions: Ensure Enlive (each supplement provides 350kcal and 20 grams of protein)   DVT prophylaxis:  SCDs Start: 10/28/21 2054 due to thrombocytopenia  Code Status: Full code Family Communication: Updated patient's son at bedside. Level of care: Progressive Status is: Inpatient  Remains inpatient appropriate because: Need for IV antibiotics for severe sepsis due to pneumonia, encephalopathy, ABLA, thrombocytopenia and safe disposition       Consultants:  Gastroenterology-following Cardiology-signed off Infectious disease   Sch Meds:  Scheduled Meds:  allopurinol  150 mg Oral Daily   colchicine  0.6 mg Oral Daily   diclofenac Sodium  2 g Topical QID   doxycycline  100 mg Oral Q12H   insulin aspart  0-5 Units Subcutaneous QHS   insulin aspart  0-9 Units Subcutaneous TID  WC   insulin glargine-yfgn  5 Units Subcutaneous Daily   pantoprazole (PROTONIX) IV  40 mg Intravenous Q12H   QUEtiapine  25 mg Oral QHS   Continuous Infusions:  sodium chloride Stopped (10/28/21 1945)   ampicillin-sulbactam (UNASYN) IV 3 g (11/03/21 0616)   PRN Meds:.acetaminophen **OR** acetaminophen, ibuprofen, metoprolol tartrate, traZODone  Antimicrobials: Anti-infectives (From admission, onward)    Start     Dose/Rate Route Frequency Ordered Stop   10/31/21 1300  doxycycline (VIBRA-TABS) tablet 100 mg        100 mg Oral Every 12 hours 10/31/21 1200 11/05/21 0959   10/31/21 1245  Ampicillin-Sulbactam (UNASYN) 3 g in sodium  chloride 0.9 % 100 mL IVPB        3 g 200 mL/hr over 30 Minutes Intravenous Every 6 hours 10/31/21 1200 11/05/21 1259   10/30/21 1045  vancomycin (VANCOREADY) IVPB 1000 mg/200 mL  Status:  Discontinued        1,000 mg 200 mL/hr over 60 Minutes Intravenous Every 36 hours 10/30/21 0957 10/31/21 1200   10/30/21 1015  ceFEPIme (MAXIPIME) 2 g in sodium chloride 0.9 % 100 mL IVPB  Status:  Discontinued        2 g 200 mL/hr over 30 Minutes Intravenous Every 12 hours 10/30/21 1010 10/31/21 1200   10/29/21 2000  ceFEPIme (MAXIPIME) 2 g in sodium chloride 0.9 % 100 mL IVPB  Status:  Discontinued        2 g 200 mL/hr over 30 Minutes Intravenous Every 24 hours 10/28/21 2011 10/30/21 1010   10/28/21 2004  vancomycin variable dose per unstable renal function (pharmacist dosing)  Status:  Discontinued         Does not apply See admin instructions 10/28/21 2004 10/31/21 1200   10/28/21 1945  ceFEPIme (MAXIPIME) 2 g in sodium chloride 0.9 % 100 mL IVPB        2 g 200 mL/hr over 30 Minutes Intravenous  Once 10/28/21 1940 10/28/21 2028   10/28/21 1945  metroNIDAZOLE (FLAGYL) IVPB 500 mg        500 mg 100 mL/hr over 60 Minutes Intravenous  Once 10/28/21 1940 10/28/21 2051   10/28/21 1945  vancomycin (VANCOREADY) IVPB 1500 mg/300 mL        1,500 mg 150 mL/hr over 120 Minutes Intravenous  Once 10/28/21 1940 10/28/21 2213        I have personally reviewed the following labs and images: CBC: Recent Labs  Lab 10/28/21 1808 10/29/21 0521 10/30/21 0743 10/31/21 0116 11/01/21 0213 11/02/21 0131 11/03/21 0130  WBC 7.1   < > 6.3 4.4 4.2 3.8* 3.0*  NEUTROABS 3.9  --   --   --  1.7  --   --   HGB 6.4*   < > 7.8* 8.0* 8.4* 8.3* 7.8*  HCT 19.8*   < > 23.3* 23.7* 24.3* 25.2* 22.7*  MCV 99.0   < > 90.0 89.4 89.7 90.6 90.4  PLT 101*   < > 53* 44* 47* 52* 46*   < > = values in this interval not displayed.   BMP &GFR Recent Labs  Lab 10/30/21 0117 10/31/21 0116 11/01/21 0213 11/02/21 0131  11/03/21 0130  NA 135 135 135 135 135  K 4.1 4.0 3.9 3.7 3.6  CL 105 108 106 102 105  CO2 21* 21* 22 25 24   GLUCOSE 203* 250* 126* 120* 142*  BUN 36* 23 20 15 17   CREATININE 1.50* 1.12 1.02 1.02 1.04  CALCIUM 8.2* 8.1*  8.1* 7.9* 7.8*  MG  --   --  1.4* 1.8 1.6*  PHOS  --   --  2.7 3.3 3.8   Estimated Creatinine Clearance: 43.8 mL/min (by C-G formula based on SCr of 1.04 mg/dL). Liver & Pancreas: Recent Labs  Lab 10/28/21 1808 10/31/21 0116 11/01/21 0213 11/02/21 0131 11/03/21 0130  AST 26 16 31   --   --   ALT 23 18 35  --   --   ALKPHOS 78 55 60  --   --   BILITOT 1.0 1.1 0.9  --   --   PROT 5.8* 4.5* 4.4*  --   --   ALBUMIN 2.8* 1.8* 1.8* 1.9* 1.8*   No results for input(s): LIPASE, AMYLASE in the last 168 hours. Recent Labs  Lab 11/01/21 0213  AMMONIA 17   Diabetic: No results for input(s): HGBA1C in the last 72 hours.  Recent Labs  Lab 11/02/21 0727 11/02/21 1136 11/02/21 1555 11/02/21 2129 11/03/21 0746  GLUCAP 125* 115* 168* 141* 108*   Cardiac Enzymes: Recent Labs  Lab 11/01/21 0213  CKTOTAL 27*   No results for input(s): PROBNP in the last 8760 hours. Coagulation Profile: Recent Labs  Lab 10/28/21 1808  INR 1.3*   Thyroid Function Tests: No results for input(s): TSH, T4TOTAL, FREET4, T3FREE, THYROIDAB in the last 72 hours. Lipid Profile: No results for input(s): CHOL, HDL, LDLCALC, TRIG, CHOLHDL, LDLDIRECT in the last 72 hours. Anemia Panel: No results for input(s): VITAMINB12, FOLATE, FERRITIN, TIBC, IRON, RETICCTPCT in the last 72 hours.  Urine analysis:    Component Value Date/Time   COLORURINE AMBER (A) 10/28/2021 1808   APPEARANCEUR HAZY (A) 10/28/2021 1808   LABSPEC 1.015 10/28/2021 1808   PHURINE 5.0 10/28/2021 1808   GLUCOSEU NEGATIVE 10/28/2021 1808   HGBUR NEGATIVE 10/28/2021 1808   BILIRUBINUR NEGATIVE 10/28/2021 1808   KETONESUR NEGATIVE 10/28/2021 1808   PROTEINUR NEGATIVE 10/28/2021 1808   NITRITE NEGATIVE 10/28/2021  1808   LEUKOCYTESUR NEGATIVE 10/28/2021 1808   Sepsis Labs: Invalid input(s): PROCALCITONIN, [place]  Microbiology: Recent Results (from the past 240 hour(s))  Blood Culture (routine x 2)     Status: None   Collection Time: 10/28/21  6:20 PM   Specimen: BLOOD LEFT HAND  Result Value Ref Range Status   Specimen Description BLOOD LEFT HAND  Final   Special Requests   Final    BOTTLES DRAWN AEROBIC ONLY Blood Culture adequate volume   Culture   Final    NO GROWTH 5 DAYS Performed at [facility], [address], [place], [place] [address]    Report Status 11/02/2021 FINAL  Final  Blood Culture (routine x 2)     Status: None   Collection Time: 10/28/21  6:46 PM   Specimen: BLOOD LEFT ARM  Result Value Ref Range Status   Specimen Description BLOOD LEFT ARM  Final   Special Requests   Final    BOTTLES DRAWN AEROBIC AND ANAEROBIC Blood Culture adequate volume   Culture   Final    NO GROWTH 5 DAYS Performed at [facility], [place] [address] [place], [place] [address]    Report Status 11/02/2021 FINAL  Final  Resp Panel by RT-PCR (Flu A&B, Covid) Nasopharyngeal Swab     Status: None   Collection Time: 10/28/21  6:48 PM   Specimen: Nasopharyngeal Swab; Nasopharyngeal(NP) swabs in vial transport medium  Result Value Ref Range Status   SARS Coronavirus 2 by RT PCR NEGATIVE  NEGATIVE Final    Comment: (NOTE) SARS-CoV-2 target nucleic acids are NOT DETECTED.  The SARS-CoV-2 RNA is generally detectable in upper respiratory specimens during the acute phase of infection. The lowest concentration of SARS-CoV-2 viral copies this assay can detect is 138 copies/mL. A negative result does not preclude SARS-Cov-2 infection and should not be used as the sole basis for treatment or other patient management decisions. A negative result may occur with  improper specimen collection/handling, submission of specimen other than nasopharyngeal swab, presence of viral mutation(s) within  the areas targeted by this assay, and inadequate number of viral copies(<138 copies/mL). A negative result must be combined with clinical observations, patient history, and epidemiological information. The expected result is Negative.  Fact Sheet for Patients:  EntrepreneurPulse.com.au  Fact Sheet for Healthcare Providers:  IncredibleEmployment.be  This test is no t yet approved or cleared by the [place] FDA and  has been authorized for detection and/or diagnosis of SARS-CoV-2 by FDA under an Emergency Use Authorization (EUA). This EUA will remain  in effect (meaning this test can be used) for the duration of the COVID-19 declaration under Section 564(b)(1) of the Act, 21 U.S.C.section 360bbb-3(b)(1), unless the authorization is terminated  or revoked sooner.       Influenza A by PCR NEGATIVE NEGATIVE Final   Influenza B by PCR NEGATIVE NEGATIVE Final    Comment: (NOTE) The Xpert Xpress SARS-CoV-2/FLU/RSV plus assay is intended as an aid in the diagnosis of influenza from Nasopharyngeal swab specimens and should not be used as a sole basis for treatment. Nasal washings and aspirates are unacceptable for Xpert Xpress SARS-CoV-2/FLU/RSV testing.  Fact Sheet for Patients: EntrepreneurPulse.com.au  Fact Sheet for Healthcare Providers: IncredibleEmployment.be  This test is not yet approved or cleared by the [place] FDA and has been authorized for detection and/or diagnosis of SARS-CoV-2 by FDA under an Emergency Use Authorization (EUA). This EUA will remain in effect (meaning this test can be used) for the duration of the COVID-19 declaration under Section 564(b)(1) of the Act, 21 U.S.C. section 360bbb-3(b)(1), unless the authorization is terminated or revoked.  Performed at [facility], [place] [address], [place], [place] [address]   Urine Culture     Status: None   Collection Time:  10/28/21  8:51 PM   Specimen: In/Out Cath Urine  Result Value Ref Range Status   Specimen Description IN/OUT CATH URINE  Final   Special Requests NONE  Final   Culture   Final    NO GROWTH Performed at [facility], [place] [address] [place], [place] [address]    Report Status 10/29/2021 FINAL  Final  MRSA Next Gen by PCR, Nasal     Status: None   Collection Time: 10/29/21  8:51 AM   Specimen: Nasal Mucosa; Nasal Swab  Result Value Ref Range Status   MRSA by PCR Next Gen NOT DETECTED NOT DETECTED Final    Comment: (NOTE) The GeneXpert MRSA Assay (FDA approved for NASAL specimens only), is one component of a comprehensive MRSA colonization surveillance program. It is not intended to diagnose MRSA infection nor to guide or monitor treatment for MRSA infections. Test performance is not FDA approved in patients less than 65 years old. Performed at [facility], [place] [address] [place], [place] [address]     Radiology Studies: No results found.   Darrelle Barrell T. Severance  If 7PM-7AM, please contact night-coverage [email or web address] 11/03/2021, 11:29 AM

## 2021-11-04 ENCOUNTER — Inpatient Hospital Stay (HOSPITAL_COMMUNITY): Payer: Medicare Other

## 2021-11-04 DIAGNOSIS — R509 Fever, unspecified: Secondary | ICD-10-CM | POA: Diagnosis not present

## 2021-11-04 DIAGNOSIS — D62 Acute posthemorrhagic anemia: Secondary | ICD-10-CM | POA: Diagnosis not present

## 2021-11-04 DIAGNOSIS — N179 Acute kidney failure, unspecified: Secondary | ICD-10-CM | POA: Diagnosis not present

## 2021-11-04 DIAGNOSIS — R4182 Altered mental status, unspecified: Secondary | ICD-10-CM | POA: Diagnosis not present

## 2021-11-04 DIAGNOSIS — A419 Sepsis, unspecified organism: Secondary | ICD-10-CM | POA: Diagnosis not present

## 2021-11-04 DIAGNOSIS — D61818 Other pancytopenia: Secondary | ICD-10-CM

## 2021-11-04 DIAGNOSIS — G9341 Metabolic encephalopathy: Secondary | ICD-10-CM | POA: Diagnosis not present

## 2021-11-04 LAB — RENAL FUNCTION PANEL
Albumin: 1.7 g/dL — ABNORMAL LOW (ref 3.5–5.0)
Anion gap: 7 (ref 5–15)
BUN: 15 mg/dL (ref 8–23)
CO2: 22 mmol/L (ref 22–32)
Calcium: 7.7 mg/dL — ABNORMAL LOW (ref 8.9–10.3)
Chloride: 108 mmol/L (ref 98–111)
Creatinine, Ser: 0.89 mg/dL (ref 0.61–1.24)
GFR, Estimated: >60 mL/min (ref >60)
Glucose, Bld: 112 mg/dL — ABNORMAL HIGH (ref 70–99)
Phosphorus: 3.1 mg/dL (ref 2.5–4.6)
Potassium: 3.8 mmol/L (ref 3.5–5.1)
Sodium: 137 mmol/L (ref 135–145)

## 2021-11-04 LAB — HIV ANTIBODY (ROUTINE TESTING W REFLEX): HIV Screen 4th Generation wRfx: NONREACTIVE

## 2021-11-04 LAB — GLUCOSE, CAPILLARY
Glucose-Capillary: 115 mg/dL — ABNORMAL HIGH (ref 70–99)
Glucose-Capillary: 169 mg/dL — ABNORMAL HIGH (ref 70–99)
Glucose-Capillary: 114 mg/dL — ABNORMAL HIGH (ref 70–99)
Glucose-Capillary: 111 mg/dL — ABNORMAL HIGH (ref 70–99)

## 2021-11-04 LAB — CBC
HCT: 23 % — ABNORMAL LOW (ref 39.0–52.0)
Hemoglobin: 7.6 g/dL — ABNORMAL LOW (ref 13.0–17.0)
MCH: 30.3 pg (ref 26.0–34.0)
MCHC: 33 g/dL (ref 30.0–36.0)
MCV: 91.6 fL (ref 80.0–100.0)
Platelets: 53 10*3/uL — ABNORMAL LOW (ref 150–400)
RBC: 2.51 MIL/uL — ABNORMAL LOW (ref 4.22–5.81)
RDW: 15.3 % (ref 11.5–15.5)
WBC: 2.7 10*3/uL — ABNORMAL LOW (ref 4.0–10.5)
nRBC: 0 % (ref 0.0–0.2)

## 2021-11-04 LAB — PROCALCITONIN: Procalcitonin: 0.16 ng/mL

## 2021-11-04 LAB — MAGNESIUM: Magnesium: 1.6 mg/dL — ABNORMAL LOW (ref 1.7–2.4)

## 2021-11-04 LAB — URIC ACID: Uric Acid, Serum: 3.2 mg/dL — ABNORMAL LOW (ref 3.7–8.6)

## 2021-11-04 LAB — SEDIMENTATION RATE: Sed Rate: 85 mm/hr — ABNORMAL HIGH (ref 0–16)

## 2021-11-04 LAB — C-REACTIVE PROTEIN: CRP: 5.8 mg/dL — ABNORMAL HIGH (ref <1.0)

## 2021-11-04 MED ORDER — MAGNESIUM SULFATE 2 GM/50ML IV SOLN
2.0000 g | Freq: Once | INTRAVENOUS | Status: AC
  Administered 2021-11-04: 14:00:00 2 g via INTRAVENOUS
  Filled 2021-11-04: qty 50

## 2021-11-04 MED ORDER — GADOBUTROL 1 MMOL/ML IV SOLN
6.3000 mL | Freq: Once | INTRAVENOUS | Status: AC | PRN
  Administered 2021-11-04: 18:00:00 6.3 mL via INTRAVENOUS

## 2021-11-04 MED ORDER — SODIUM CHLORIDE 0.9 % IV SOLN: INTRAVENOUS | Status: DC

## 2021-11-04 MED ORDER — IOHEXOL 300 MG/ML  SOLN
80.0000 mL | Freq: Once | INTRAMUSCULAR | Status: AC | PRN
  Administered 2021-11-04: 18:00:00 80 mL via INTRAVENOUS

## 2021-11-04 MED ORDER — SODIUM CHLORIDE 0.9 % IV BOLUS
500.0000 mL | Freq: Once | INTRAVENOUS | Status: AC
  Administered 2021-11-04: 07:00:00 500 mL via INTRAVENOUS

## 2021-11-04 NOTE — Progress Notes (Signed)
Bp low consistently but adequate Map. Text to MD regarding trending low bp

## 2021-11-04 NOTE — Progress Notes (Signed)
Subjective: Still complaining of bilateral knee and ankle and calf pain   Antibiotics:  Anti-infectives (From admission, onward)    Start     Dose/Rate Route Frequency Ordered Stop   10/31/21 1300  doxycycline (VIBRA-TABS) tablet 100 mg  Status:  Discontinued        100 mg Oral Every 12 hours 10/31/21 1200 11/03/21 1458   10/31/21 1245  Ampicillin-Sulbactam (UNASYN) 3 g in sodium chloride 0.9 % 100 mL IVPB  Status:  Discontinued        3 g 200 mL/hr over 30 Minutes Intravenous Every 6 hours 10/31/21 1200 11/03/21 1458   10/30/21 1045  vancomycin (VANCOREADY) IVPB 1000 mg/200 mL  Status:  Discontinued        1,000 mg 200 mL/hr over 60 Minutes Intravenous Every 36 hours 10/30/21 0957 10/31/21 1200   10/30/21 1015  ceFEPIme (MAXIPIME) 2 g in sodium chloride 0.9 % 100 mL IVPB  Status:  Discontinued        2 g 200 mL/hr over 30 Minutes Intravenous Every 12 hours 10/30/21 1010 10/31/21 1200   10/29/21 2000  ceFEPIme (MAXIPIME) 2 g in sodium chloride 0.9 % 100 mL IVPB  Status:  Discontinued        2 g 200 mL/hr over 30 Minutes Intravenous Every 24 hours 10/28/21 2011 10/30/21 1010   10/28/21 2004  vancomycin variable dose per unstable renal function (pharmacist dosing)  Status:  Discontinued         Does not apply See admin instructions 10/28/21 2004 10/31/21 1200   10/28/21 1945  ceFEPIme (MAXIPIME) 2 g in sodium chloride 0.9 % 100 mL IVPB        2 g 200 mL/hr over 30 Minutes Intravenous  Once 10/28/21 1940 10/28/21 2028   10/28/21 1945  metroNIDAZOLE (FLAGYL) IVPB 500 mg        500 mg 100 mL/hr over 60 Minutes Intravenous  Once 10/28/21 1940 10/28/21 2051   10/28/21 1945  vancomycin (VANCOREADY) IVPB 1500 mg/300 mL        1,500 mg 150 mL/hr over 120 Minutes Intravenous  Once 10/28/21 1940 10/28/21 2213       Medications: Scheduled Meds:  allopurinol  150 mg Oral Daily   colchicine  0.6 mg Oral BID   diclofenac Sodium  2 g Topical QID   insulin aspart  0-5 Units  Subcutaneous QHS   insulin aspart  0-9 Units Subcutaneous TID WC   insulin glargine-yfgn  5 Units Subcutaneous Daily   pantoprazole (PROTONIX) IV  40 mg Intravenous Q12H   QUEtiapine  25 mg Oral QHS   Continuous Infusions:  sodium chloride 100 mL/hr at 11/04/21 0722   PRN Meds:.acetaminophen **OR** acetaminophen, ibuprofen, metoprolol tartrate, traZODone    Objective: Weight change:   Intake/Output Summary (Last 24 hours) at 11/04/2021 1524 Last data filed at 11/04/2021 1413 Gross per 24 hour  Intake 200 ml  Output 1300 ml  Net -1100 ml   Blood pressure 111/73, pulse 74, temperature 98.8 F (37.1 C), temperature source Oral, resp. rate 20, height 5' 2"  (1.575 m), weight 63.6 kg, SpO2 95 %. Temp:  [98.3 F (36.8 C)-100.2 F (37.9 C)] 98.8 F (37.1 C) (11/27 1212) Pulse Rate:  [67-93] 74 (11/27 1400) Resp:  [15-22] 20 (11/27 1400) BP: (81-123)/(44-73) 111/73 (11/27 1400) SpO2:  [92 %-98 %] 95 % (11/27 1400)  Physical Exam: Physical Exam Constitutional:      Appearance: He is well-developed.  HENT:  Head: Normocephalic and atraumatic.  Eyes:     Conjunctiva/sclera: Conjunctivae normal.  Cardiovascular:     Rate and Rhythm: Normal rate and regular rhythm.     Heart sounds: No murmur heard.   Friction rub present. No gallop.  Pulmonary:     Effort: Pulmonary effort is normal. No respiratory distress.     Breath sounds: Normal breath sounds. No stridor. No wheezing or rhonchi.  Abdominal:     General: There is no distension.     Palpations: Abdomen is soft. There is no mass.     Tenderness: There is no abdominal tenderness.     Hernia: No hernia is present.  Musculoskeletal:        General: Normal range of motion.     Cervical back: Normal range of motion and neck supple.  Skin:    General: Skin is warm and dry.     Findings: No erythema or rash.  Neurological:     General: No focal deficit present.     Mental Status: He is alert and oriented to person,  place, and time.  Psychiatric:        Speech: Speech normal.        Behavior: Behavior normal.        Thought Content: Thought content normal.        Cognition and Memory: Cognition is impaired. Memory is impaired. He exhibits impaired recent memory and impaired remote memory.        Judgment: Judgment normal.    He continues to have tenderness in both knees and apparent pain with range of motion there is range of motion seems fairly good and he does not have to my exam an obvious effusion.    CBC:    BMET Recent Labs    11/03/21 0130 11/04/21 0748  NA 135 137  K 3.6 3.8  CL 105 108  CO2 24 22  GLUCOSE 142* 112*  BUN 17 15  CREATININE 1.04 0.89  CALCIUM 7.8* 7.7*     Liver Panel  Recent Labs    11/03/21 0130 11/04/21 0748  ALBUMIN 1.8* 1.7*       Sedimentation Rate Recent Labs    11/04/21 0050  ESRSEDRATE 85*   C-Reactive Protein Recent Labs    11/04/21 0050  CRP 5.8*    Micro Results: Recent Results (from the past 720 hour(s))  Blood Culture (routine x 2)     Status: None   Collection Time: 10/28/21  6:20 PM   Specimen: BLOOD LEFT HAND  Result Value Ref Range Status   Specimen Description BLOOD LEFT HAND  Final   Special Requests   Final    BOTTLES DRAWN AEROBIC ONLY Blood Culture adequate volume   Culture   Final    NO GROWTH 5 DAYS Performed at [facility], [address], [place], [place] [address]    Report Status 11/02/2021 FINAL  Final  Blood Culture (routine x 2)     Status: None   Collection Time: 10/28/21  6:46 PM   Specimen: BLOOD LEFT ARM  Result Value Ref Range Status   Specimen Description BLOOD LEFT ARM  Final   Special Requests   Final    BOTTLES DRAWN AEROBIC AND ANAEROBIC Blood Culture adequate volume   Culture   Final    NO GROWTH 5 DAYS Performed at [facility], [place] [address], [place], [place] [address]    Report Status 11/02/2021 FINAL  Final  Resp Panel  by RT-PCR (Flu A&B, Covid) Nasopharyngeal  Swab     Status: None   Collection Time: 10/28/21  6:48 PM   Specimen: Nasopharyngeal Swab; Nasopharyngeal(NP) swabs in vial transport medium  Result Value Ref Range Status   SARS Coronavirus 2 by RT PCR NEGATIVE NEGATIVE Final    Comment: (NOTE) SARS-CoV-2 target nucleic acids are NOT DETECTED.  The SARS-CoV-2 RNA is generally detectable in upper respiratory specimens during the acute phase of infection. The lowest concentration of SARS-CoV-2 viral copies this assay can detect is 138 copies/mL. A negative result does not preclude SARS-Cov-2 infection and should not be used as the sole basis for treatment or other patient management decisions. A negative result may occur with  improper specimen collection/handling, submission of specimen other than nasopharyngeal swab, presence of viral mutation(s) within the areas targeted by this assay, and inadequate number of viral copies(<138 copies/mL). A negative result must be combined with clinical observations, patient history, and epidemiological information. The expected result is Negative.  Fact Sheet for Patients:  EntrepreneurPulse.com.au  Fact Sheet for Healthcare Providers:  IncredibleEmployment.be  This test is no t yet approved or cleared by the [place] FDA and  has been authorized for detection and/or diagnosis of SARS-CoV-2 by FDA under an Emergency Use Authorization (EUA). This EUA will remain  in effect (meaning this test can be used) for the duration of the COVID-19 declaration under Section 564(b)(1) of the Act, 21 U.S.C.section 360bbb-3(b)(1), unless the authorization is terminated  or revoked sooner.       Influenza A by PCR NEGATIVE NEGATIVE Final   Influenza B by PCR NEGATIVE NEGATIVE Final    Comment: (NOTE) The Xpert Xpress SARS-CoV-2/FLU/RSV plus assay is intended as an aid in the diagnosis of influenza from Nasopharyngeal swab specimens and should not be used as a sole  basis for treatment. Nasal washings and aspirates are unacceptable for Xpert Xpress SARS-CoV-2/FLU/RSV testing.  Fact Sheet for Patients: EntrepreneurPulse.com.au  Fact Sheet for Healthcare Providers: IncredibleEmployment.be  This test is not yet approved or cleared by the [place] FDA and has been authorized for detection and/or diagnosis of SARS-CoV-2 by FDA under an Emergency Use Authorization (EUA). This EUA will remain in effect (meaning this test can be used) for the duration of the COVID-19 declaration under Section 564(b)(1) of the Act, 21 U.S.C. section 360bbb-3(b)(1), unless the authorization is terminated or revoked.  Performed at [facility], [place] [address], [place], [place] [address]   Urine Culture     Status: None   Collection Time: 10/28/21  8:51 PM   Specimen: In/Out Cath Urine  Result Value Ref Range Status   Specimen Description IN/OUT CATH URINE  Final   Special Requests NONE  Final   Culture   Final    NO GROWTH Performed at [facility], [place] [address], [place], [place] [address]    Report Status 10/29/2021 FINAL  Final  MRSA Next Gen by PCR, Nasal     Status: None   Collection Time: 10/29/21  8:51 AM   Specimen: Nasal Mucosa; Nasal Swab  Result Value Ref Range Status   MRSA by PCR Next Gen NOT DETECTED NOT DETECTED Final    Comment: (NOTE) The GeneXpert MRSA Assay (FDA approved for NASAL specimens only), is one component of a comprehensive MRSA colonization surveillance program. It is not intended to diagnose MRSA infection nor to guide or monitor treatment for MRSA infections. Test performance is not FDA approved in patients less than  39 years old. Performed at [facility], [place] [address], [place], [place] [address]   Culture, blood (routine x 2)     Status: None (Preliminary result)   Collection Time: 11/03/21  7:58 AM   Specimen: BLOOD  Result Value Ref Range Status   Specimen  Description BLOOD LEFT ANTECUBITAL  Final   Special Requests   Final    BOTTLES DRAWN AEROBIC ONLY Blood Culture results may not be optimal due to an inadequate volume of blood received in culture bottles   Culture   Final    NO GROWTH < 24 HOURS Performed at [facility], [place] [address], [place], [place] [address]    Report Status PENDING  Incomplete  Culture, blood (routine x 2)     Status: None (Preliminary result)   Collection Time: 11/03/21  7:58 AM   Specimen: BLOOD LEFT HAND  Result Value Ref Range Status   Specimen Description BLOOD LEFT HAND  Final   Special Requests   Final    BOTTLES DRAWN AEROBIC AND ANAEROBIC Blood Culture results may not be optimal due to an inadequate volume of blood received in culture bottles   Culture   Final    NO GROWTH < 24 HOURS Performed at [facility], [place] [address] [place], [place] [address]    Report Status PENDING  Incomplete    Studies/Results: No results found.    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 male Spanish-speaking man with history of stroke and left bundle branch block diabetes mellitus chronic kidney disease who had presented with confusion and labored breathing.   He had been found to be profoundly anemic and the need of blood transfusion.  He also had acute kidney injury.  He also had apparent acute onset thrombocytopenia with platelets being 101 on November 20 versus 404 in January 2021 which of the last lab we have in our system.  His initial CBC had shown also 600 immature granulocytes with peripheral smear showing marked left shift with metal sites myelocytes and occasional blasts seen  Patient had imaging in the ER and blood cultures were taken CT of the abdomen pelvis and CT chest were performed with CT chest and CT abdomen pelvis is suggested infiltrate in the left upper lobe and left lower lobe.  Patient had been on very broad-spectrum antibiotics in the form of vancomycin metronidazole and cefepime followed by vancomycin and cefepime then Unasyn and doxycycline.  I believe now the doxycycline is likely added to cover potential [facility] spotted fever, her lochia.  Despite being on antibiotics he then became febrile to 102.3 degrees on November 02, 2021.  When I saw him yesterday still was fairly  confused and there is a question of whether he might have some underlying cognitive deficit.  The main complaints that he voiced to me and that the family also mentioned were his bilateral knee calf and ankle pain.  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  However gout would certainly not explain his worsening thrombocytopenia and now leukopenia over the last several days.  I am concerned that we are missing something as far as the cause for all of his constellation of symptoms.  A tickborne infection with Ehrlichia or [place] spotted fever would make some sense to me but his failure to show much improvement with doxycycline would argue against this.  Despite the fact that I am not overwhelmed by his exam as far as his left and right knees I am going to get an MRI of the left knee with  contrast to look for deep infection in this site.  I am also going to repeat a CT of the chest.  If the imaging is unrevealing I am happy to reinitiate doxycycline for possible tickborne infection but I think if we come up with underwhelming imaging that he will need consultation from hematology oncology and a bone marrow biopsy.  In talking the patient and family there is no history of excessive alcohol consumption on the part of the patient no history of cirrhosis and a CT of the abdomen pelvis is does not show any abnormalities in the liver or spleen.    I have sent HIV serology which was never checked on him and which is pending.   I spent 40  minutes with the patient is an iPad Spanish translation including face to face counseling of the patient and his grandson regarding work-up for his fevers and musculoskeletal complaints his initial presentation personally reviewing CT chest abdomen pelvis is serial CBCs BMP along with  review of medical records before and during the visit and in coordination of his care with Dr. Doristine Bosworth.  LOS: 7 days   Alcide Evener 11/04/2021, 3:24 PM

## 2021-11-04 NOTE — Progress Notes (Signed)
PROGRESS NOTE  Eugene Cooper HQR:[id number] DOB: August 09, 1941   PCP: Emelia Loron, NP  Patient is from: Home.  Lives with family.  Uses rolling walker at baseline.  DOA: 10/28/2021 LOS: 7  Chief complaints:  Chief Complaint  Patient presents with   Altered Mental Status     Brief Narrative / Interim history: 80 year old Spanish-speaking male with PMH of CVA, LBBB, DM-2, CKD-3A, HLD and ambulatory dysfunction presenting with altered mental status, labored breathing, poor p.o. intake and bilateral lower extremity pain, and admitted for acute blood loss anemia, sinus tachycardia, AKI, hyperkalemia and severe sepsis due to LLL pneumonia.  Initially concern about A. fib which was ruled out by cardiology.  Hgb 6.4.  He Hemoccult was positive.  EGD revealed gastritis. Cr 3.5.  K6.2.  CXR with right suprahilar nodular opacity.  CTH negative.  CT abdomen and pelvis raises concern for LLL consolidation.  CT chest with patchy consolidation with air and LLL and LUL, 7 mm LUL nodule.    Patient was transfused 1 unit with appropriate response.  H&H stable.  He was started on IV vancomycin and cefepime but then de-escalated to IV Unasyn and doxycycline.  Main issue is his metabolic encephalopathy/confusion with intermittent agitation. Spiked fever to 102.3 with tachycardia, hypotension and leukopenia the evening of 11/25.  ID consulted   Subjective: Seen and examined earlier this morning using video interpreter with ID number [id number].  Patient's son at bedside.  He is a little hypotensive but not tachycardic.  No major events overnight of this morning.  He is awake.  Reports that his knee and ankle pain are better.  He denies chest pain, shortness of breath, nausea, vomiting or abdominal pain.  However, patient is not a great historian.  He is only oriented to self, his son and "hospital".  Objective: Vitals:   11/04/21 0758 11/04/21 0900 11/04/21 0918 11/04/21 0955  BP: 117/66 (!) 89/50 (!) 98/58    Pulse: 74 67  72  Resp: 15 16  18   Temp: 99.1 F (37.3 C)     TempSrc: Oral     SpO2: 92% 93%  95%  Weight:      Height:        Intake/Output Summary (Last 24 hours) at 11/04/2021 1124 Last data filed at 11/04/2021 0646 Gross per 24 hour  Intake 240 ml  Output 900 ml  Net -660 ml   Filed Weights   10/29/21 1345  Weight: 63.6 kg    Examination:  GENERAL: No apparent distress.  Nontoxic. HEENT: MMM.  Vision and hearing grossly intact.  NECK: Supple.  No apparent JVD.  RESP: On RA.  No IWOB.  Fair aeration bilaterally. CVS:  RRR. Heart sounds normal.  ABD/GI/GU: BS+. Abd soft, NTND.  MSK/EXT: Some stiffness and significant muscle mass and subcu fat loss in BLE. SKIN: no apparent skin lesion or wound NEURO: Awake.  Oriented to self, his son and "hospital".  No apparent focal neuro deficit but weakness and stiffness in his legs. PSYCH: Calm. Normal affect.   Procedures:  11/31-EGD with erosive gastropathy and gastritis but no signs of bleeding.  Microbiology summarized: 11/20-COVID-19 and influenza PCR nonreactive. 11/21-MRSA PCR nonreactive. 11/20-blood culture and urine culture NGTD.  Assessment & Plan: Severe sepsis due to pneumonia: POA.  Heart tachycardia, tachypnea, AKI, AMS and lactic acidosis to 6.8.  Culture data as above.  CT C/A/P concerning for LUL and LLL consolidation.  Pro-Cal elevated but improving.  Spiked fever with tachycardia, hypotension and leukopenia on  11/25. -11/20-Vanco and cefepime>> 11/23 Unasyn and doxycycline>> 11/26 -Repeat blood culture, urinalysis and urine culture -Infectious disease consulted-plan for repeat CT chest -IS/OOB/PT/OT  ABLA/IDA/GI bleed/erosive gastropathy/gastritis: Hgb 14.6 in 12/2019.  Hemoccult positive.  EGD with erosive gastropathy and gastritis.  H&H stable after 1 unit. Recent Labs    10/28/21 1808 10/29/21 0521 10/29/21 1559 10/30/21 0117 10/30/21 0743 10/31/21 0116 11/01/21 0213 11/02/21 0131  11/03/21 0130 11/04/21 0748  HGB 6.4* 8.5* 8.6* 7.6* 7.8* 8.0* 8.4* 8.3* 7.8* 7.6*  -Monitor H&H and platelets. -P.o. Protonix 40 mg twice daily for 8 weeks -GI following-colonoscopy when able to drink prep.   Hypotension/sinus tachycardia: Felt to be due to sepsis. TTE with LVEF of 50 to 55% and RVSP of 31 mmHg but no other significant finding.  Tachycardia resolved but intermittently hypotensive. -Continue IV fluid -Infectious work-up per ID   AKI/azotemia on CKD-3A: Could be prerenal and ATN from diuretics, NSAID and sepsis.  Resolved Recent Labs    10/28/21 1808 10/28/21 2039 10/29/21 0521 10/30/21 0117 10/31/21 0116 11/01/21 0213 11/02/21 0131 11/03/21 0130 11/04/21 0748  BUN 65* 58* 51* 36* 23 20 15 17 15   CREATININE 3.54* 2.68* 2.05* 1.50* 1.12 1.02 1.02 1.04 0.89  -Continue IV fluid given poor p.o. intake  Acute metabolic encephalopathy/delirium: I believe he has some degree of cognitive impairment although family denied this.  He has history of CVA.  He is also having hospital delirium.  CTH, B12, ammonia and RPR unrevealing.  No nuchal rigidity to suggest meningitis.  He had 6 days of antibiotic for pneumonia.  No respiratory issues.  He has lower extremity pain and stiffness, mainly in his left knee felt to be due to gout.  He is oriented to self, place and "hospital. -Treat treatable causes as above -Reorientation and delirium precautions -Continue low-dose Seroquel at night  Uncontrolled DM-2 with hyperglycemia: A1c 7.5%. Recent Labs  Lab 11/03/21 0746 11/03/21 1152 11/03/21 1612 11/03/21 2103 11/04/21 0800  GLUCAP 108* 180* 144* 186* 115*  -Continue SSI-sensitive -Continue basal insulin 5 units daily   Pancytopenia-H&H is stable after transfusion.  Platelets slightly improved.  Still leukopenic at 2.7 -MRI of left knee and repeat CT chest today per ID -If infectious work-up negative, will consult hematology/oncology.  7 mm LUL nodule.  Noted on CT  chest. -Repeat CT chest in 6 to 12 months   Hyperkalemia: Likely due to AKI.  Resolved.  Hypomagnesemia: Mg 1.6. -IV magnesium sulfate 2 g x 1.   History of CVA: Stable. -Continue home aspirin and statin.  Left knee pain/tenderness/stiffness/possible gout-likely arthritis.  X-ray without acute finding.  He has elevated inflammatory markers.  He is leukopenic.  Uric acid remains low. -Voltaren gel, colchicine and allopurinol -Agree with MRI  Language barrier affecting communication -Used video interpreter   Body mass index is 25.65 kg/m. Signs/Symptoms: estimated needs Interventions: Ensure Enlive (each supplement provides 350kcal and 20 grams of protein)   DVT prophylaxis:  SCDs Start: 10/28/21 2054 due to thrombocytopenia  Code Status: Full code Family Communication: Updated patient's son at bedside. Level of care: Progressive Status is: Inpatient  Remains inpatient appropriate because: Encephalopathy, hypotension and further evaluation for pancytopenia/infectious process       Consultants:  Gastroenterology-following Cardiology-signed off Infectious disease   Sch Meds:  Scheduled Meds:  allopurinol  150 mg Oral Daily   colchicine  0.6 mg Oral BID   diclofenac Sodium  2 g Topical QID   insulin aspart  0-5 Units Subcutaneous QHS  insulin aspart  0-9 Units Subcutaneous TID WC   insulin glargine-yfgn  5 Units Subcutaneous Daily   pantoprazole (PROTONIX) IV  40 mg Intravenous Q12H   QUEtiapine  25 mg Oral QHS   Continuous Infusions:  sodium chloride 100 mL/hr at 11/04/21 2202   magnesium sulfate bolus IVPB     PRN Meds:.acetaminophen **OR** acetaminophen, ibuprofen, metoprolol tartrate, traZODone  Antimicrobials: Anti-infectives (From admission, onward)    Start     Dose/Rate Route Frequency Ordered Stop   10/31/21 1300  doxycycline (VIBRA-TABS) tablet 100 mg  Status:  Discontinued        100 mg Oral Every 12 hours 10/31/21 1200 11/03/21 1458    10/31/21 1245  Ampicillin-Sulbactam (UNASYN) 3 g in sodium chloride 0.9 % 100 mL IVPB  Status:  Discontinued        3 g 200 mL/hr over 30 Minutes Intravenous Every 6 hours 10/31/21 1200 11/03/21 1458   10/30/21 1045  vancomycin (VANCOREADY) IVPB 1000 mg/200 mL  Status:  Discontinued        1,000 mg 200 mL/hr over 60 Minutes Intravenous Every 36 hours 10/30/21 0957 10/31/21 1200   10/30/21 1015  ceFEPIme (MAXIPIME) 2 g in sodium chloride 0.9 % 100 mL IVPB  Status:  Discontinued        2 g 200 mL/hr over 30 Minutes Intravenous Every 12 hours 10/30/21 1010 10/31/21 1200   10/29/21 2000  ceFEPIme (MAXIPIME) 2 g in sodium chloride 0.9 % 100 mL IVPB  Status:  Discontinued        2 g 200 mL/hr over 30 Minutes Intravenous Every 24 hours 10/28/21 2011 10/30/21 1010   10/28/21 2004  vancomycin variable dose per unstable renal function (pharmacist dosing)  Status:  Discontinued         Does not apply See admin instructions 10/28/21 2004 10/31/21 1200   10/28/21 1945  ceFEPIme (MAXIPIME) 2 g in sodium chloride 0.9 % 100 mL IVPB        2 g 200 mL/hr over 30 Minutes Intravenous  Once 10/28/21 1940 10/28/21 2028   10/28/21 1945  metroNIDAZOLE (FLAGYL) IVPB 500 mg        500 mg 100 mL/hr over 60 Minutes Intravenous  Once 10/28/21 1940 10/28/21 2051   10/28/21 1945  vancomycin (VANCOREADY) IVPB 1500 mg/300 mL        1,500 mg 150 mL/hr over 120 Minutes Intravenous  Once 10/28/21 1940 10/28/21 2213        I have personally reviewed the following labs and images: CBC: Recent Labs  Lab 10/28/21 1808 10/29/21 0521 10/31/21 0116 11/01/21 0213 11/02/21 0131 11/03/21 0130 11/04/21 0748  WBC 7.1   < > 4.4 4.2 3.8* 3.0* 2.7*  NEUTROABS 3.9  --   --  1.7  --   --   --   HGB 6.4*   < > 8.0* 8.4* 8.3* 7.8* 7.6*  HCT 19.8*   < > 23.7* 24.3* 25.2* 22.7* 23.0*  MCV 99.0   < > 89.4 89.7 90.6 90.4 91.6  PLT 101*   < > 44* 47* 52* 46* 53*   < > = values in this interval not displayed.   BMP &GFR Recent  Labs  Lab 10/31/21 0116 11/01/21 0213 11/02/21 0131 11/03/21 0130 11/04/21 0748  NA 135 135 135 135 137  K 4.0 3.9 3.7 3.6 3.8  CL 108 106 102 105 108  CO2 21* 22 25 24 22   GLUCOSE 250* 126* 120* 142* 112*  BUN  23 20 15 17 15   CREATININE 1.12 1.02 1.02 1.04 0.89  CALCIUM 8.1* 8.1* 7.9* 7.8* 7.7*  MG  --  1.4* 1.8 1.6* 1.6*  PHOS  --  2.7 3.3 3.8 3.1   Estimated Creatinine Clearance: 51.1 mL/min (by C-G formula based on SCr of 0.89 mg/dL). Liver & Pancreas: Recent Labs  Lab 10/28/21 1808 10/31/21 0116 11/01/21 0213 11/02/21 0131 11/03/21 0130 11/04/21 0748  AST 26 16 31   --   --   --   ALT 23 18 35  --   --   --   ALKPHOS 78 55 60  --   --   --   BILITOT 1.0 1.1 0.9  --   --   --   PROT 5.8* 4.5* 4.4*  --   --   --   ALBUMIN 2.8* 1.8* 1.8* 1.9* 1.8* 1.7*   No results for input(s): LIPASE, AMYLASE in the last 168 hours. Recent Labs  Lab 11/01/21 0213  AMMONIA 17   Diabetic: No results for input(s): HGBA1C in the last 72 hours.  Recent Labs  Lab 11/03/21 0746 11/03/21 1152 11/03/21 1612 11/03/21 2103 11/04/21 0800  GLUCAP 108* 180* 144* 186* 115*   Cardiac Enzymes: Recent Labs  Lab 11/01/21 0213  CKTOTAL 27*   No results for input(s): PROBNP in the last 8760 hours. Coagulation Profile: Recent Labs  Lab 10/28/21 1808  INR 1.3*   Thyroid Function Tests: No results for input(s): TSH, T4TOTAL, FREET4, T3FREE, THYROIDAB in the last 72 hours. Lipid Profile: No results for input(s): CHOL, HDL, LDLCALC, TRIG, CHOLHDL, LDLDIRECT in the last 72 hours. Anemia Panel: No results for input(s): VITAMINB12, FOLATE, FERRITIN, TIBC, IRON, RETICCTPCT in the last 72 hours.  Urine analysis:    Component Value Date/Time   COLORURINE YELLOW 11/03/2021 [place] 11/03/2021 0734   LABSPEC 1.016 11/03/2021 0734   PHURINE 7.0 11/03/2021 0734   GLUCOSEU NEGATIVE 11/03/2021 0734   HGBUR SMALL (A) 11/03/2021 [place] 11/03/2021 [place] 11/03/2021 0734   PROTEINUR NEGATIVE 11/03/2021 0734   NITRITE NEGATIVE 11/03/2021 0734   LEUKOCYTESUR NEGATIVE 11/03/2021 0734   Sepsis Labs: Invalid input(s): PROCALCITONIN, [place]  Microbiology: Recent Results (from the past 240 hour(s))  Blood Culture (routine x 2)     Status: None   Collection Time: 10/28/21  6:20 PM   Specimen: BLOOD LEFT HAND  Result Value Ref Range Status   Specimen Description BLOOD LEFT HAND  Final   Special Requests   Final    BOTTLES DRAWN AEROBIC ONLY Blood Culture adequate volume   Culture   Final    NO GROWTH 5 DAYS Performed at [facility], [address] [place], [place] [address]    Report Status 11/02/2021 FINAL  Final  Blood Culture (routine x 2)     Status: None   Collection Time: 10/28/21  6:46 PM   Specimen: BLOOD LEFT ARM  Result Value Ref Range Status   Specimen Description BLOOD LEFT ARM  Final   Special Requests   Final    BOTTLES DRAWN AEROBIC AND ANAEROBIC Blood Culture adequate volume   Culture   Final    NO GROWTH 5 DAYS Performed at [facility], [place] [address], [place], [place] [address]    Report Status 11/02/2021 FINAL  Final  Resp Panel by RT-PCR (Flu A&B, Covid) Nasopharyngeal Swab     Status: None   Collection Time: 10/28/21  6:48 PM   Specimen: Nasopharyngeal Swab; Nasopharyngeal(NP) swabs in vial transport medium  Result Value Ref Range Status   SARS Coronavirus 2 by RT PCR NEGATIVE NEGATIVE Final    Comment: (NOTE) SARS-CoV-2 target nucleic acids are NOT DETECTED.  The SARS-CoV-2 RNA is generally detectable in upper respiratory specimens during the acute phase of infection. The lowest concentration of SARS-CoV-2 viral copies this assay can detect is 138 copies/mL. A negative result does not preclude SARS-Cov-2 infection and should not be used as the sole basis for treatment or other patient management decisions. A negative result may occur with  improper specimen  collection/handling, submission of specimen other than nasopharyngeal swab, presence of viral mutation(s) within the areas targeted by this assay, and inadequate number of viral copies(<138 copies/mL). A negative result must be combined with clinical observations, patient history, and epidemiological information. The expected result is Negative.  Fact Sheet for Patients:  EntrepreneurPulse.com.au  Fact Sheet for Healthcare Providers:  IncredibleEmployment.be  This test is no t yet approved or cleared by the [place] FDA and  has been authorized for detection and/or diagnosis of SARS-CoV-2 by FDA under an Emergency Use Authorization (EUA). This EUA will remain  in effect (meaning this test can be used) for the duration of the COVID-19 declaration under Section 564(b)(1) of the Act, 21 U.S.C.section 360bbb-3(b)(1), unless the authorization is terminated  or revoked sooner.       Influenza A by PCR NEGATIVE NEGATIVE Final   Influenza B by PCR NEGATIVE NEGATIVE Final    Comment: (NOTE) The Xpert Xpress SARS-CoV-2/FLU/RSV plus assay is intended as an aid in the diagnosis of influenza from Nasopharyngeal swab specimens and should not be used as a sole basis for treatment. Nasal washings and aspirates are unacceptable for Xpert Xpress SARS-CoV-2/FLU/RSV testing.  Fact Sheet for Patients: EntrepreneurPulse.com.au  Fact Sheet for Healthcare Providers: IncredibleEmployment.be  This test is not yet approved or cleared by the [place] FDA and has been authorized for detection and/or diagnosis of SARS-CoV-2 by FDA under an Emergency Use Authorization (EUA). This EUA will remain in effect (meaning this test can be used) for the duration of the COVID-19 declaration under Section 564(b)(1) of the Act, 21 U.S.C. section 360bbb-3(b)(1), unless the authorization is terminated or revoked.  Performed at [facility], [place] [address], [place], [place] [address]   Urine Culture     Status: None   Collection Time: 10/28/21  8:51 PM   Specimen: In/Out Cath Urine  Result Value Ref Range Status   Specimen Description IN/OUT CATH URINE  Final   Special Requests NONE  Final   Culture   Final    NO GROWTH Performed at [facility], [place] [address], [place], [place] [address]    Report Status 10/29/2021 FINAL  Final  MRSA Next Gen by PCR, Nasal     Status: None   Collection Time: 10/29/21  8:51 AM   Specimen: Nasal Mucosa; Nasal Swab  Result Value Ref Range Status   MRSA by PCR Next Gen NOT DETECTED NOT DETECTED Final    Comment: (NOTE) The GeneXpert MRSA Assay (FDA approved for NASAL specimens only), is one component of a comprehensive MRSA colonization surveillance program. It is not intended to diagnose MRSA infection nor to guide or monitor treatment for MRSA infections. Test performance is not FDA approved in patients less than 62 years old. Performed at [facility], [place] [address], [place], [place] [address]   Culture,  blood (routine x 2)     Status: None (Preliminary result)   Collection Time: 11/03/21  7:58 AM   Specimen: BLOOD  Result Value Ref Range Status   Specimen Description BLOOD LEFT ANTECUBITAL  Final   Special Requests   Final    BOTTLES DRAWN AEROBIC ONLY Blood Culture results may not be optimal due to an inadequate volume of blood received in culture bottles   Culture   Final    NO GROWTH < 24 HOURS Performed at [facility], [place] [address], [place], [place] [address]    Report Status PENDING  Incomplete  Culture, blood (routine x 2)     Status: None (Preliminary result)   Collection Time: 11/03/21  7:58 AM   Specimen: BLOOD LEFT HAND  Result Value Ref Range Status   Specimen Description BLOOD LEFT HAND  Final   Special Requests   Final    BOTTLES DRAWN AEROBIC AND ANAEROBIC Blood Culture results may not be optimal due to an inadequate volume  of blood received in culture bottles   Culture   Final    NO GROWTH < 24 HOURS Performed at [facility], [place] [address] [place], [place] [address]    Report Status PENDING  Incomplete    Radiology Studies: No results found.   Dashanique Brownstein T. [place]  If 7PM-7AM, please contact night-coverage [email or web address] 11/04/2021, 11:24 AM

## 2021-11-04 NOTE — Progress Notes (Signed)
MD and RN in room to discuss plan of care with patient and family. MD aware of low blood pressures and low urine output.   Family involved in care and are hopeful to get him back home. Delirium prevention addressed. Lights on, windows open, patient sat up in bed to eat breakfast.

## 2021-11-05 ENCOUNTER — Encounter (HOSPITAL_COMMUNITY): Payer: Self-pay | Admitting: Family Medicine

## 2021-11-05 DIAGNOSIS — G9341 Metabolic encephalopathy: Secondary | ICD-10-CM | POA: Diagnosis not present

## 2021-11-05 DIAGNOSIS — R509 Fever, unspecified: Secondary | ICD-10-CM | POA: Diagnosis not present

## 2021-11-05 DIAGNOSIS — D62 Acute posthemorrhagic anemia: Secondary | ICD-10-CM | POA: Diagnosis not present

## 2021-11-05 DIAGNOSIS — A419 Sepsis, unspecified organism: Secondary | ICD-10-CM | POA: Diagnosis not present

## 2021-11-05 DIAGNOSIS — N179 Acute kidney failure, unspecified: Secondary | ICD-10-CM | POA: Diagnosis not present

## 2021-11-05 LAB — CBC WITH DIFFERENTIAL/PLATELET
Abs Immature Granulocytes: 0.04 10*3/uL (ref 0.00–0.07)
Basophils Absolute: 0 10*3/uL (ref 0.0–0.1)
Basophils Relative: 1 %
Eosinophils Absolute: 0 10*3/uL (ref 0.0–0.5)
Eosinophils Relative: 0 %
HCT: 23 % — ABNORMAL LOW (ref 39.0–52.0)
Hemoglobin: 7.8 g/dL — ABNORMAL LOW (ref 13.0–17.0)
Immature Granulocytes: 2 %
Lymphocytes Relative: 35 %
Lymphs Abs: 0.9 10*3/uL (ref 0.7–4.0)
MCH: 31 pg (ref 26.0–34.0)
MCHC: 33.9 g/dL (ref 30.0–36.0)
MCV: 91.3 fL (ref 80.0–100.0)
Monocytes Absolute: 0.4 10*3/uL (ref 0.1–1.0)
Monocytes Relative: 13 %
Neutro Abs: 1.3 10*3/uL — ABNORMAL LOW (ref 1.7–7.7)
Neutrophils Relative %: 49 %
Platelets: 57 10*3/uL — ABNORMAL LOW (ref 150–400)
RBC: 2.52 MIL/uL — ABNORMAL LOW (ref 4.22–5.81)
RDW: 15.2 % (ref 11.5–15.5)
Smear Review: DECREASED
WBC: 2.7 10*3/uL — ABNORMAL LOW (ref 4.0–10.5)
nRBC: 0 % (ref 0.0–0.2)

## 2021-11-05 LAB — GLUCOSE, CAPILLARY
Glucose-Capillary: 79 mg/dL (ref 70–99)
Glucose-Capillary: 116 mg/dL — ABNORMAL HIGH (ref 70–99)
Glucose-Capillary: 141 mg/dL — ABNORMAL HIGH (ref 70–99)
Glucose-Capillary: 136 mg/dL — ABNORMAL HIGH (ref 70–99)

## 2021-11-05 LAB — COMPREHENSIVE METABOLIC PANEL
ALT: 36 U/L (ref 0–44)
AST: 20 U/L (ref 15–41)
Albumin: 1.8 g/dL — ABNORMAL LOW (ref 3.5–5.0)
Alkaline Phosphatase: 90 U/L (ref 38–126)
Anion gap: 5 (ref 5–15)
BUN: 13 mg/dL (ref 8–23)
CO2: 24 mmol/L (ref 22–32)
Calcium: 7.8 mg/dL — ABNORMAL LOW (ref 8.9–10.3)
Chloride: 109 mmol/L (ref 98–111)
Creatinine, Ser: 0.96 mg/dL (ref 0.61–1.24)
GFR, Estimated: >60 mL/min (ref >60)
Glucose, Bld: 94 mg/dL (ref 70–99)
Potassium: 3.7 mmol/L (ref 3.5–5.1)
Sodium: 138 mmol/L (ref 135–145)
Total Bilirubin: 0.9 mg/dL (ref 0.3–1.2)
Total Protein: 4.5 g/dL — ABNORMAL LOW (ref 6.5–8.1)

## 2021-11-05 LAB — MAGNESIUM: Magnesium: 1.8 mg/dL (ref 1.7–2.4)

## 2021-11-05 LAB — PHOSPHORUS: Phosphorus: 3.2 mg/dL (ref 2.5–4.6)

## 2021-11-05 LAB — PLATELET COUNT: Platelets: 59 10*3/uL — ABNORMAL LOW (ref 150–400)

## 2021-11-05 NOTE — Progress Notes (Signed)
Physical Therapy Treatment Patient Details Name: Eugene Cooper MRN: [id number] DOB: 01/10/1941 Today's Date: 11/05/2021   History of Present Illness 80 yo admitted 11/20 with AMS, weakness, tachycardia, hyperkalemia and symptomatic anemia. EGD 11/21. PMhx: DM, CVA, HTN, HLD, LBBB, gout    PT Comments    Patient with apparently less pain in his legs today and was able to stand at EOB x 3 reps for up to 2 minutes with +2 mod assist. No family present during session to discuss discharge planning (apparently they do not want pt to go to SNF)    Recommendations for follow up therapy are one component of a multi-disciplinary discharge planning process, led by the attending physician.  Recommendations may be updated based on patient status, additional functional criteria and insurance authorization.  Follow Up Recommendations  Skilled nursing-short term rehab (<3 hours/day) If family refuses, will need HHPT     Assistance Recommended at Discharge Frequent or constant Supervision/Assistance  Equipment Recommendations  None recommended by PT    Recommendations for Other Services OT consult     Precautions / Restrictions Precautions Precautions: Fall Precaution Comments: bil knee pain, gout Restrictions Weight Bearing Restrictions: No     Mobility  Bed Mobility Overal bed mobility: Needs Assistance Bed Mobility: Supine to Sit;Sit to Supine;Rolling Rolling: Modified independent (Device/Increase time) (with rail)   Supine to sit: Min assist Sit to supine: Min assist   General bed mobility comments: assist for raising torso and then raising legs onto bed    Transfers Overall transfer level: Needs assistance Equipment used: Rolling walker (2 wheels) Transfers: Sit to/from Stand Sit to Stand: Mod assist;+2 physical assistance           General transfer comment: stood x 3 reps for up to 2 minutes    Ambulation/Gait Ambulation/Gait assistance: Mod assist;+2 physical  assistance Gait Distance (Feet): 2 Feet Assistive device: Rolling walker (2 wheels) Gait Pattern/deviations: Step-to pattern       General Gait Details: side-steps to left toward [place]             Wheelchair Mobility    Modified Rankin (Stroke Patients Only)       Balance Overall balance assessment: Needs assistance;History of Falls Sitting-balance support: Feet supported Sitting balance-Leahy Scale: Fair     Standing balance support: Bilateral upper extremity supported;Reliant on assistive device for balance Standing balance-Leahy Scale: Poor                              Cognition Arousal/Alertness: Awake/alert Behavior During Therapy: Flat affect Overall Cognitive Status: No family/caregiver present to determine baseline cognitive functioning                                 General Comments: Followed commands (via interpreter)        Exercises      General Comments General comments (skin integrity, edema, etc.): Increased time due to use of virtual interpreter, Eugene Cooper      Pertinent Vitals/Pain Pain Assessment: Faces Faces Pain Scale: Hurts little more Pain Location: bilat knees Pain Descriptors / Indicators: Sore;Discomfort Pain Intervention(s): Limited activity within patient's tolerance;Monitored during session    Home Living                          Prior Function  PT Goals (current goals can now be found in the care plan section) Acute Rehab PT Goals Patient Stated Goal: be able to walk and return home Time For Goal Achievement: 11/13/21 Potential to Achieve Goals: Fair Progress towards PT goals: Progressing toward goals    Frequency    Min 3X/week (pt's son is refusing SNF, other family members interested in SNF)      PT Plan Current plan remains appropriate    Co-evaluation              AM-PAC PT "6 Clicks" Mobility   Outcome Measure  Help needed turning from your  back to your side while in a flat bed without using bedrails?: A Little Help needed moving from lying on your back to sitting on the side of a flat bed without using bedrails?: A Little Help needed moving to and from a bed to a chair (including a wheelchair)?: Total Help needed standing up from a chair using your arms (e.g., wheelchair or bedside chair)?: Total Help needed to walk in hospital room?: Total Help needed climbing 3-5 steps with a railing? : Total 6 Click Score: 10    End of Session   Activity Tolerance: Patient limited by fatigue Patient left: in bed;with call bell/phone within reach;with bed alarm set   PT Visit Diagnosis: Other abnormalities of gait and mobility (R26.89);Difficulty in walking, not elsewhere classified (R26.2);Muscle weakness (generalized) (M62.81);History of falling (Z91.81)     Time: 8270-7867 PT Time Calculation (min) (ACUTE ONLY): 23 min  Charges:  $Gait Training: 8-22 mins $Therapeutic Activity: 8-22 mins                      Arby Barrette, PT Acute Rehabilitation Services  Pager [phone] Office [phone]    Eugene Cooper 11/05/2021, 3:28 PM

## 2021-11-05 NOTE — Progress Notes (Signed)
PROGRESS NOTE  Eugene Cooper VPX:[id number] DOB: [birth year]   PCP: Emelia Loron, NP  Patient is from: Home.  Lives with family.  Uses rolling walker at baseline.  DOA: 10/28/2021 LOS: 8  Chief complaints:  Chief Complaint  Patient presents with   Altered Mental Status     Brief Narrative / Interim history: 80 year old Spanish-speaking male with PMH of CVA, LBBB, DM-2, CKD-3A, HLD and ambulatory dysfunction presenting with altered mental status, labored breathing, poor p.o. intake and bilateral lower extremity pain, and admitted for acute blood loss anemia, sinus tachycardia, AKI, hyperkalemia and severe sepsis due to LLL pneumonia.  Initially concern about A. fib which was ruled out by cardiology.  Hgb 6.4.  He Hemoccult was positive.  EGD revealed gastritis. Cr 3.5.  K6.2.  CXR with right suprahilar nodular opacity.  CTH negative.  CT abdomen and pelvis raises concern for LLL consolidation.  CT chest with patchy consolidation with air and LLL and LUL, 7 mm LUL nodule.    Patient was transfused 1 unit with appropriate response.  H&H stable.  He was started on IV vancomycin and cefepime but then de-escalated to IV Unasyn and doxycycline.  Main issue is his metabolic encephalopathy/confusion with intermittent agitation. Spiked fever to 102.3 with tachycardia, hypotension and leukopenia the evening of 11/25.  ID consulted and recommended MRI right knee, repeat CT chest, hematology consult and bone marrow biopsy.   Subjective: Seen and examined earlier this morning with the help of video interpreter with ID number [id number].  No major events overnight of this morning.  Patient's son spent the night here.  He says he slept well, and ate most of his breakfast this morning.  Patient has no complaints.  He denies pain.   Objective: Vitals:   11/05/21 0731 11/05/21 1129 11/05/21 1200 11/05/21 1548  BP: 123/66 124/72 118/66 113/64  Pulse: 67 68 74 76  Resp: 17 18 17 20   Temp: 98 F (36.7 C)  98.4 F (36.9 C)  98.4 F (36.9 C)  TempSrc: Oral Oral  Oral  SpO2: 97% 93% 95% 97%  Weight:      Height:        Intake/Output Summary (Last 24 hours) at 11/05/2021 1616 Last data filed at 11/05/2021 1500 Gross per 24 hour  Intake 840.07 ml  Output 1200 ml  Net -359.93 ml   Filed Weights   10/29/21 1345  Weight: 63.6 kg    Examination:  GENERAL: No apparent distress.  Nontoxic. HEENT: MMM.  Vision and hearing grossly intact.  NECK: Supple.  No apparent JVD.  RESP: 97% on RA.  No IWOB.  Fair aeration bilaterally. CVS:  RRR. Heart sounds normal.  ABD/GI/GU: BS+. Abd soft, NTND.  MSK/EXT: Some stiffness and significant muscle mass and subcu fat loss in BLE. SKIN: no apparent skin lesion or wound NEURO: Awake.  Oriented to self, his son and hospital.  No apparent focal neuro deficit but limited exam due to weakness. PSYCH: Calm. Normal affect.   Procedures:  11/31-EGD with erosive gastropathy and gastritis but no signs of bleeding.  Microbiology summarized: 11/20-COVID-19 and influenza PCR nonreactive. 11/21-MRSA PCR nonreactive. 11/20-blood culture and urine culture NGTD.  Assessment & Plan: Severe sepsis due to pneumonia: POA.  Heart tachycardia, tachypnea, AKI, AMS and lactic acidosis to 6.8.  Culture data as above.  CT C/A/P concerning for LUL and LLL consolidation.  Repeat CT chest with improvement.  Pro-Cal elevated but improving.  Spiked fever with tachycardia, hypotension and leukopenia on 11/25.  Afebrile since then but leukopenic.  Repeat blood culture NGTD. -Infectious disease following. -11/20-Vanco and cefepime>> 11/23 Unasyn>> 11/26 -11/23 doxycycline>> for possible tickborne disease -IS/OOB/PT/OT  ABLA/IDA/GI bleed/erosive gastropathy/gastritis: Hgb 14.6 in 12/2019.  Hemoccult positive.  EGD with erosive gastropathy and gastritis.  H&H stable after 1 unit. Recent Labs    10/29/21 0521 10/29/21 1559 10/30/21 0117 10/30/21 0743 10/31/21 0116  11/01/21 0213 11/02/21 0131 11/03/21 0130 11/04/21 0748 11/05/21 0227  HGB 8.5* 8.6* 7.6* 7.8* 8.0* 8.4* 8.3* 7.8* 7.6* 7.8*  -Monitor H&H and platelets. -P.o. Protonix 40 mg twice daily for 8 weeks -GI following-colonoscopy when able to drink prep.   Hypotension/sinus tachycardia: Felt to be due to sepsis. TTE with LVEF of 50 to 55% and RVSP of 31 mmHg but no other significant finding.  Resolved. -Continue IV fluid   AKI/azotemia on CKD-3A: Could be prerenal and ATN from diuretics, NSAID and sepsis.  Resolved Recent Labs    10/28/21 1808 10/28/21 2039 10/29/21 0521 10/30/21 0117 10/31/21 0116 11/01/21 0213 11/02/21 0131 11/03/21 0130 11/04/21 0748 11/05/21 0227  BUN 65* 58* 51* 36* 23 20 15 17 15 13   CREATININE 3.54* 2.68* 2.05* 1.50* 1.12 1.02 1.02 1.04 0.89 0.96  -Continue IV fluid given poor p.o. intake  Acute metabolic encephalopathy/delirium: I believe he has some degree of cognitive impairment although family denied this.  He has history of CVA.  He is also having hospital delirium.  CTH, B12, ammonia and RPR unrevealing.  No nuchal rigidity to suggest meningitis.  He had 6 days of antibiotic for pneumonia.  No respiratory issues.  He has lower extremity pain and stiffness, mainly in his left knee felt to be due to gout.  He is oriented to self, place and "hospital" -Treat treatable causes as above -Reorientation and delirium precautions -Continue low-dose Seroquel at night  Uncontrolled DM-2 with hyperglycemia: A1c 7.5%. Recent Labs  Lab 11/04/21 1844 11/04/21 2004 11/05/21 0732 11/05/21 1127 11/05/21 1547  GLUCAP 114* 111* 79 116* 141*  -Continue SSI-sensitive -Continue basal insulin 5 units daily   Pancytopenia-H&H is stable after transfusion.  Platelets improving.  Still leukopenic at 2.7 -Appreciate help by hematology and ID. -Seems like ID has ordered bone marrow biopsy but will not be done until Thursday.  7 mm LUL nodule.  Noted on CT chest. -Repeat  CT chest in 6 to 12 months   Hyperkalemia: Likely due to AKI.  Resolved.  Hypomagnesemia: Resolved.   History of CVA: Stable. -Continue home aspirin and statin.  Left knee pain/tenderness/stiffness: MRI with severe arthritic changes and intermediate grade, partial width tearing of the central and lateral fibers of the distal quadriceps tendon at its insertion on the patella.  intermediate grade, partial width tearing of the central and lateral fibers of the distal quadriceps tendon at its insertion on the patella.  Uric acid low.  Pain improved. -Continue supportive care  Language barrier affecting communication -Used video interpreter   Body mass index is 25.65 kg/m. Signs/Symptoms: estimated needs Interventions: Ensure Enlive (each supplement provides 350kcal and 20 grams of protein)   DVT prophylaxis:  SCDs Start: 10/28/21 2054 due to thrombocytopenia  Code Status: Full code Family Communication: Updated patient's son at bedside. Level of care: Progressive Status is: Inpatient  Remains inpatient appropriate because: Encephalopathy and further evaluation for pancytopenia/infectious process       Consultants:  Gastroenterology-following Cardiology-signed off Infectious disease   Sch Meds:  Scheduled Meds:  allopurinol  150 mg Oral Daily   colchicine  0.6  mg Oral BID   diclofenac Sodium  2 g Topical QID   insulin aspart  0-5 Units Subcutaneous QHS   insulin aspart  0-9 Units Subcutaneous TID WC   insulin glargine-yfgn  5 Units Subcutaneous Daily   pantoprazole (PROTONIX) IV  40 mg Intravenous Q12H   QUEtiapine  25 mg Oral QHS   Continuous Infusions:  sodium chloride 100 mL/hr at 11/05/21 0558   PRN Meds:.acetaminophen **OR** acetaminophen, ibuprofen, metoprolol tartrate, traZODone  Antimicrobials: Anti-infectives (From admission, onward)    Start     Dose/Rate Route Frequency Ordered Stop   10/31/21 1300  doxycycline (VIBRA-TABS) tablet 100 mg  Status:   Discontinued        100 mg Oral Every 12 hours 10/31/21 1200 11/03/21 1458   10/31/21 1245  Ampicillin-Sulbactam (UNASYN) 3 g in sodium chloride 0.9 % 100 mL IVPB  Status:  Discontinued        3 g 200 mL/hr over 30 Minutes Intravenous Every 6 hours 10/31/21 1200 11/03/21 1458   10/30/21 1045  vancomycin (VANCOREADY) IVPB 1000 mg/200 mL  Status:  Discontinued        1,000 mg 200 mL/hr over 60 Minutes Intravenous Every 36 hours 10/30/21 0957 10/31/21 1200   10/30/21 1015  ceFEPIme (MAXIPIME) 2 g in sodium chloride 0.9 % 100 mL IVPB  Status:  Discontinued        2 g 200 mL/hr over 30 Minutes Intravenous Every 12 hours 10/30/21 1010 10/31/21 1200   10/29/21 2000  ceFEPIme (MAXIPIME) 2 g in sodium chloride 0.9 % 100 mL IVPB  Status:  Discontinued        2 g 200 mL/hr over 30 Minutes Intravenous Every 24 hours 10/28/21 2011 10/30/21 1010   10/28/21 2004  vancomycin variable dose per unstable renal function (pharmacist dosing)  Status:  Discontinued         Does not apply See admin instructions 10/28/21 2004 10/31/21 1200   10/28/21 1945  ceFEPIme (MAXIPIME) 2 g in sodium chloride 0.9 % 100 mL IVPB        2 g 200 mL/hr over 30 Minutes Intravenous  Once 10/28/21 1940 10/28/21 2028   10/28/21 1945  metroNIDAZOLE (FLAGYL) IVPB 500 mg        500 mg 100 mL/hr over 60 Minutes Intravenous  Once 10/28/21 1940 10/28/21 2051   10/28/21 1945  vancomycin (VANCOREADY) IVPB 1500 mg/300 mL        1,500 mg 150 mL/hr over 120 Minutes Intravenous  Once 10/28/21 1940 10/28/21 2213        I have personally reviewed the following labs and images: CBC: Recent Labs  Lab 11/01/21 0213 11/02/21 0131 11/03/21 0130 11/04/21 0748 11/05/21 0227 11/05/21 1124  WBC 4.2 3.8* 3.0* 2.7* 2.7*  --   NEUTROABS 1.7  --   --   --  1.3*  --   HGB 8.4* 8.3* 7.8* 7.6* 7.8*  --   HCT 24.3* 25.2* 22.7* 23.0* 23.0*  --   MCV 89.7 90.6 90.4 91.6 91.3  --   PLT 47* 52* 46* 53* 57* 59*   BMP &GFR Recent Labs  Lab  11/01/21 0213 11/02/21 0131 11/03/21 0130 11/04/21 0748 11/05/21 0227  NA 135 135 135 137 138  K 3.9 3.7 3.6 3.8 3.7  CL 106 102 105 108 109  CO2 22 25 24 22 24   GLUCOSE 126* 120* 142* 112* 94  BUN 20 15 17 15 13   CREATININE 1.02 1.02 1.04 0.89 0.96  CALCIUM 8.1* 7.9* 7.8* 7.7* 7.8*  MG 1.4* 1.8 1.6* 1.6* 1.8  PHOS 2.7 3.3 3.8 3.1 3.2   Estimated Creatinine Clearance: 47.4 mL/min (by C-G formula based on SCr of 0.96 mg/dL). Liver & Pancreas: Recent Labs  Lab 10/31/21 0116 11/01/21 0213 11/02/21 0131 11/03/21 0130 11/04/21 0748 11/05/21 0227  AST 16 31  --   --   --  20  ALT 18 35  --   --   --  36  ALKPHOS 55 60  --   --   --  90  BILITOT 1.1 0.9  --   --   --  0.9  PROT 4.5* 4.4*  --   --   --  4.5*  ALBUMIN 1.8* 1.8* 1.9* 1.8* 1.7* 1.8*   No results for input(s): LIPASE, AMYLASE in the last 168 hours. Recent Labs  Lab 11/01/21 0213  AMMONIA 17   Diabetic: No results for input(s): HGBA1C in the last 72 hours.  Recent Labs  Lab 11/04/21 1844 11/04/21 2004 11/05/21 0732 11/05/21 1127 11/05/21 1547  GLUCAP 114* 111* 79 116* 141*   Cardiac Enzymes: Recent Labs  Lab 11/01/21 0213  CKTOTAL 27*   No results for input(s): PROBNP in the last 8760 hours. Coagulation Profile: No results for input(s): INR, PROTIME in the last 168 hours.  Thyroid Function Tests: No results for input(s): TSH, T4TOTAL, FREET4, T3FREE, THYROIDAB in the last 72 hours. Lipid Profile: No results for input(s): CHOL, HDL, LDLCALC, TRIG, CHOLHDL, LDLDIRECT in the last 72 hours. Anemia Panel: No results for input(s): VITAMINB12, FOLATE, FERRITIN, TIBC, IRON, RETICCTPCT in the last 72 hours.  Urine analysis:    Component Value Date/Time   COLORURINE YELLOW 11/03/2021 [place] 11/03/2021 0734   LABSPEC 1.016 11/03/2021 0734   PHURINE 7.0 11/03/2021 0734   GLUCOSEU NEGATIVE 11/03/2021 0734   HGBUR SMALL (A) 11/03/2021 [place] 11/03/2021 [place] 11/03/2021 0734   PROTEINUR NEGATIVE 11/03/2021 0734   NITRITE NEGATIVE 11/03/2021 0734   LEUKOCYTESUR NEGATIVE 11/03/2021 0734   Sepsis Labs: Invalid input(s): PROCALCITONIN, [place]  Microbiology: Recent Results (from the past 240 hour(s))  Blood Culture (routine x 2)     Status: None   Collection Time: 10/28/21  6:20 PM   Specimen: BLOOD LEFT HAND  Result Value Ref Range Status   Specimen Description BLOOD LEFT HAND  Final   Special Requests   Final    BOTTLES DRAWN AEROBIC ONLY Blood Culture adequate volume   Culture   Final    NO GROWTH 5 DAYS Performed at [facility], [address] [place], [place] [address]    Report Status 11/02/2021 FINAL  Final  Blood Culture (routine x 2)     Status: None   Collection Time: 10/28/21  6:46 PM   Specimen: BLOOD LEFT ARM  Result Value Ref Range Status   Specimen Description BLOOD LEFT ARM  Final   Special Requests   Final    BOTTLES DRAWN AEROBIC AND ANAEROBIC Blood Culture adequate volume   Culture   Final    NO GROWTH 5 DAYS Performed at [facility], [place] [address] [place], [place] [address]    Report Status 11/02/2021 FINAL  Final  Resp Panel by RT-PCR (Flu A&B, Covid) Nasopharyngeal Swab     Status: None   Collection Time: 10/28/21  6:48 PM   Specimen: Nasopharyngeal Swab; Nasopharyngeal(NP) swabs in vial transport medium  Result  Value Ref Range Status   SARS Coronavirus 2 by RT PCR NEGATIVE NEGATIVE Final    Comment: (NOTE) SARS-CoV-2 target nucleic acids are NOT DETECTED.  The SARS-CoV-2 RNA is generally detectable in upper respiratory specimens during the acute phase of infection. The lowest concentration of SARS-CoV-2 viral copies this assay can detect is 138 copies/mL. A negative result does not preclude SARS-Cov-2 infection and should not be used as the sole basis for treatment or other patient management decisions. A negative result may occur with  improper specimen  collection/handling, submission of specimen other than nasopharyngeal swab, presence of viral mutation(s) within the areas targeted by this assay, and inadequate number of viral copies(<138 copies/mL). A negative result must be combined with clinical observations, patient history, and epidemiological information. The expected result is Negative.  Fact Sheet for Patients:  EntrepreneurPulse.com.au  Fact Sheet for Healthcare Providers:  IncredibleEmployment.be  This test is no t yet approved or cleared by the [place] FDA and  has been authorized for detection and/or diagnosis of SARS-CoV-2 by FDA under an Emergency Use Authorization (EUA). This EUA will remain  in effect (meaning this test can be used) for the duration of the COVID-19 declaration under Section 564(b)(1) of the Act, 21 U.S.C.section 360bbb-3(b)(1), unless the authorization is terminated  or revoked sooner.       Influenza A by PCR NEGATIVE NEGATIVE Final   Influenza B by PCR NEGATIVE NEGATIVE Final    Comment: (NOTE) The Xpert Xpress SARS-CoV-2/FLU/RSV plus assay is intended as an aid in the diagnosis of influenza from Nasopharyngeal swab specimens and should not be used as a sole basis for treatment. Nasal washings and aspirates are unacceptable for Xpert Xpress SARS-CoV-2/FLU/RSV testing.  Fact Sheet for Patients: EntrepreneurPulse.com.au  Fact Sheet for Healthcare Providers: IncredibleEmployment.be  This test is not yet approved or cleared by the [place] FDA and has been authorized for detection and/or diagnosis of SARS-CoV-2 by FDA under an Emergency Use Authorization (EUA). This EUA will remain in effect (meaning this test can be used) for the duration of the COVID-19 declaration under Section 564(b)(1) of the Act, 21 U.S.C. section 360bbb-3(b)(1), unless the authorization is terminated or revoked.  Performed at [facility], [place] [address], [place], [place] [address]   Urine Culture     Status: None   Collection Time: 10/28/21  8:51 PM   Specimen: In/Out Cath Urine  Result Value Ref Range Status   Specimen Description IN/OUT CATH URINE  Final   Special Requests NONE  Final   Culture   Final    NO GROWTH Performed at [facility], [place] [address], [place], [place] [address]    Report Status 10/29/2021 FINAL  Final  MRSA Next Gen by PCR, Nasal     Status: None   Collection Time: 10/29/21  8:51 AM   Specimen: Nasal Mucosa; Nasal Swab  Result Value Ref Range Status   MRSA by PCR Next Gen NOT DETECTED NOT DETECTED Final    Comment: (NOTE) The GeneXpert MRSA Assay (FDA approved for NASAL specimens only), is one component of a comprehensive MRSA colonization surveillance program. It is not intended to diagnose MRSA infection nor to guide or monitor treatment for MRSA infections. Test performance is not FDA approved in patients less than 58 years old. Performed at [facility], [place] [address], [place], [place] [address]   Culture, blood (routine x 2)     Status: None (Preliminary result)   Collection  Time: 11/03/21  7:58 AM   Specimen: BLOOD  Result Value Ref Range Status   Specimen Description BLOOD LEFT ANTECUBITAL  Final   Special Requests   Final    BOTTLES DRAWN AEROBIC ONLY Blood Culture results may not be optimal due to an inadequate volume of blood received in culture bottles   Culture   Final    NO GROWTH 2 DAYS Performed at [facility], [place] [address], [place], [place] [address]    Report Status PENDING  Incomplete  Culture, blood (routine x 2)     Status: None (Preliminary result)   Collection Time: 11/03/21  7:58 AM   Specimen: BLOOD LEFT HAND  Result Value Ref Range Status   Specimen Description BLOOD LEFT HAND  Final   Special Requests   Final    BOTTLES DRAWN AEROBIC AND ANAEROBIC Blood Culture results may not be optimal due to an inadequate volume of  blood received in culture bottles   Culture   Final    NO GROWTH 2 DAYS Performed at [facility], [place] [address], [place],  [address]    Report Status PENDING  Incomplete    Radiology Studies: CT CHEST W CONTRAST  Result Date: 11/04/2021 CLINICAL DATA:  Fever. EXAM: CT CHEST WITH CONTRAST TECHNIQUE: Multidetector CT imaging of the chest was performed during intravenous contrast administration. CONTRAST:  22m OMNIPAQUE IOHEXOL 300 MG/ML  SOLN COMPARISON:  October 31, 2021 FINDINGS: Cardiovascular: There is mild calcification of the aortic arch, without evidence of aortic aneurysm or dissection. Normal heart size with mild coronary artery calcification. No pericardial effusion. Mediastinum/Nodes: There is mild AP window, right paratracheal and right hilar lymphadenopathy. There is stable marked severity narrowing of the right mainstem bronchus (axial CT image 51, CT series 4). The thyroid gland and esophagus demonstrate no significant findings. Lungs/Pleura: Mild areas of atelectasis are seen within the inferior aspect of the left upper lobe and posterior aspects of the bilateral lung bases. This is mildly decreased in severity within the superior segment of the left upper lobe when compared to the prior study and stable in severity throughout the remaining portions of the bilateral lung bases and left upper lobe. A stable 7 mm left upper lobe noncalcified lung nodule is seen (axial CT image 91, CT series 4). Small, stable bilateral pleural effusions are noted. No pneumothorax is identified. Upper Abdomen: No acute abnormality. Musculoskeletal: No chest wall abnormality. No acute or significant osseous findings. IMPRESSION: 1. Mild areas of left upper lobe and bilateral lower lobe atelectasis and/or infiltrate, mildly decreased in severity within the left lower lobe since the prior study. 2. Small, stable bilateral pleural effusions. 3. Stable 7 mm left upper lobe noncalcified lung nodule.  Non-contrast chest CT at 6-12 months is recommended. If the nodule is stable at time of repeat CT, then future CT at 18-24 months (from today's scan) is considered optional for low-risk patients, but is recommended for high-risk patients. This recommendation follows the consensus statement: Guidelines for Management of Incidental Pulmonary Nodules Detected on CT Images: From the Fleischner Society 2017; Radiology 2017; 284:228-243. 4. Stable marked severity narrowing of the right mainstem bronchus. 5. Mild coronary artery disease. 6. Aortic atherosclerosis. Aortic Atherosclerosis (ICD10-I70.0). Electronically Signed   By: TVirgina NorfolkM.D.   On: 11/04/2021 20:23   MR KNEE LEFT W WO CONTRAST  Result Date: 11/05/2021 CLINICAL DATA:  Septic arthritis suspected, knee, xray done EXAM: MRI OF THE LEFT KNEE WITHOUT AND WITH CONTRAST  TECHNIQUE: Multiplanar, multisequence MR imaging of the left knee was performed both before and after administration of intravenous contrast. CONTRAST:  6.28m GADAVIST GADOBUTROL 1 MMOL/ML IV SOLN COMPARISON:  None. FINDINGS: MENISCI Medial: Nondisplaced degenerative tearing of the posterior horn and body of the medial meniscus. Lateral: Intrasubstance degenerative signal without definitive tear. LIGAMENTS Cruciates: ACL and PCL are intact. Collaterals: Medial collateral ligament is intact. Lateral collateral ligament complex is intact. CARTILAGE Patellofemoral: Mild chondrosis. There is a 2 mm intermediate grade focal defect along the medial trochlea (series 3, image 14). Medial: Overall moderate chondrosis with intermediate grade chondral fissuring along the weight-bearing medial femoral condyle and partial-thickness cartilage loss along the tibial plateau. Lateral:  Mild chondrosis.  No focal defect. JOINT: Small joint effusion with appropriately thin synovial enhancement. POPLITEAL FOSSA: No significant Baker cyst. EXTENSOR MECHANISM: There is intermediate grade, partial width  tearing of the central and lateral fibers of the distal quadriceps tendon at its insertion on the patella, and with reactive adjacent edema/cystic change in the patella. The patella tendon appears mildly lax with insertional patellar tendinosis at the patella. BONES: There is no acute fracture or dislocation. There is mild bony edema signal within the fibular head. The cortex is intact. There is no aggressive osseous lesion. Other: There is intramuscular edema signal within the distal vastus medialis and lateralis muscles which is likely reactive in association with the quadriceps tendon tearing. There is also intramuscular edema signal within the proximal aspect of the anterior compartment muscles in the lower leg, which is likely reactive as well. No focal fluid collection. No abnormal enhancement. IMPRESSION: Tricompartment osteoarthritis, worst in the medial compartment with nondisplaced degenerative tearing of the posterior horn and body of the medial meniscus. Moderate medial compartment chondrosis with intermediate grade chondral fissuring and partial-thickness cartilage loss along the weight-bearing surfaces. Mild patellofemoral chondrosis with 2 mm focal chondral defect along the medial trochlea. Intermediate grade, partial width tearing of the central and lateral fibers of the distal quadriceps tendon at its insertion on the patella. Reactive intramuscular edema within the distal vastus medialis and lateralis muscles as well as the proximal anterior compartment muscles in the lower leg. Moderate proximal insertional patellar tendinosis. Small joint effusion, which is likely reactive to the above findings. No convincing findings to suggest septic arthritis or osteomyelitis. Electronically Signed   By: JMaurine SimmeringM.D.   On: 11/05/2021 08:24     [place]. [place] 7PM-7AM, please contact night-coverage [email or web address] 11/05/2021, 4:16 PM

## 2021-11-05 NOTE — Consult Note (Signed)
Chief Complaint: Patient was seen in consultation today for  Chief Complaint  Patient presents with   Altered Mental Status   at the request of Dr. Tommy Medal  Supervising Physician: Michaelle Birks  Patient Status: [name] - In-pt  History of Present Illness: Eugene Cooper is a 80 y.o. male, [place] speaking who was admitted with AMS.  He has a PMH of CVA, LBBB, DM2, CKD, HLD, and ambulatory dysfunction.  On admission he was found to have labored breathing, poor PO intake and bilateral LE pain.  He was found to have acute blood loss anemia, sinus tachycardia, AKI, hyperkalemia, and sepsis due to LLL.  IR consulted in reference to his worsening thrombocytopenia and persistent leukopenia despite transfusion.  H&H is stable, PLT mildly improved, Leukocytes 2.7.  A bone marrow biopsy has been requested.  Past Medical History:  Diagnosis Date   CKD (chronic kidney disease), stage III ([place])    Diabetes ([place])    Hyperlipemia    Hypertension    LBBB (left bundle branch block)    Stroke [facility])     Past Surgical History:  Procedure Laterality Date   BIOPSY  10/29/2021   Procedure: BIOPSY;  Surgeon: Thornton Park, MD;  Location: [facility];  Service: Gastroenterology;;   ESOPHAGOGASTRODUODENOSCOPY (EGD) WITH PROPOFOL N/A 10/29/2021   Procedure: ESOPHAGOGASTRODUODENOSCOPY (EGD) WITH PROPOFOL;  Surgeon: Thornton Park, MD;  Location: [place];  Service: Gastroenterology;  Laterality: N/A;    Allergies: Patient has no known allergies.  Medications: Prior to Admission medications   Medication Sig Start Date End Date Taking? Authorizing Provider  allopurinol (ZYLOPRIM) 300 MG tablet Take 1 tablet (300 mg total) by mouth daily. 06/04/21  Yes Rice, Resa Miner, MD  aspirin 325 MG EC tablet Take 325 mg by mouth at bedtime.   Yes [provider]  atorvastatin (LIPITOR) 40 MG tablet Take 1 tablet (40 mg total) by mouth daily at 6 PM. 01/07/20  Yes Love, Ivan Anchors, PA-C   glipiZIDE (GLUCOTROL XL) 10 MG 24 hr tablet Take 10 mg by mouth in the morning. 10/02/21  Yes [provider]  ibuprofen (ADVIL) 200 MG tablet Take 200-400 mg by mouth every 6 (six) hours as needed for mild pain or headache.   Yes [provider]  lisinopril (ZESTRIL) 5 MG tablet Take 1 tablet (5 mg total) by mouth daily. 03/30/20  Yes Jettie Booze, MD  metFORMIN (GLUCOPHAGE) 1000 MG tablet Take 1,000 mg by mouth 2 (two) times daily. 02/29/20  Yes [provider]  pioglitazone (ACTOS) 15 MG tablet Take 15 mg by mouth in the morning.   Yes [provider]  triamterene-hydrochlorothiazide (MAXZIDE-25) 37.5-25 MG tablet Take 1 tablet by mouth daily. 05/18/21  Yes [provider]  Accu-Chek Softclix Lancets lancets USE UPTO FOUR TIMES DAILY AS DIRECTED 01/08/20   [provider]  acetaminophen (TYLENOL) 325 MG tablet Take 1-2 tablets (325-650 mg total) by mouth every 4 (four) hours as needed for mild pain. Patient not taking: Reported on 10/29/2021 01/07/20   Love, Ivan Anchors, PA-C  aspirin 81 MG EC tablet Take 1 tablet (81 mg total) by mouth daily. Patient not taking: Reported on 10/29/2021 03/30/20   Jettie Booze, MD  blood glucose meter kit and supplies KIT Dispense based on patient and insurance preference. Use up to four times daily as directed. (FOR ICD-9 250.00, 250.01). 01/07/20   Love, Ivan Anchors, PA-C  gabapentin (NEURONTIN) 100 MG capsule Take 1 capsule (100 mg total) by  mouth 3 (three) times daily. Patient not taking: Reported on 10/29/2021 01/07/20   Love, Ivan Anchors, PA-C  HYDROcodone-acetaminophen (NORCO/VICODIN) 5-325 MG tablet Take 0.5-1 tablets by mouth every 6 (six) hours as needed. Patient not taking: Reported on 10/29/2021 01/11/20   [provider]  Menthol-Methyl Salicylate (MUSCLE RUB) 10-15 % CREA Apply 1 application topically 2 (two) times daily as needed for muscle pain. To lower back 01/07/20   Love, Ivan Anchors,  PA-C  Multiple Vitamin (MULTIVITAMIN WITH MINERALS) TABS tablet Take 1 tablet by mouth daily. 01/07/20   Love, Ivan Anchors, PA-C  senna-docusate (SENOKOT-S) 8.6-50 MG tablet Take 2 tablets by mouth 2 (two) times daily. Purchase over the counter Patient not taking: Reported on 10/29/2021 01/07/20   Love, Ivan Anchors, PA-C  topiramate (TOPAMAX) 25 MG tablet Take 1 tablet (25 mg total) by mouth 2 (two) times daily. Patient not taking: Reported on 10/29/2021 01/20/20   Charlett Blake, MD     Family History  Problem Relation Age of Onset   Stroke Mother    Lung disease Father    Diabetes Sister    Heart disease Sister    Hyperlipidemia Son    Hyperlipidemia Daughter    Hypertension Daughter     Social History   Socioeconomic History   Marital status: Single    Spouse name: Not on file   Number of children: Not on file   Years of education: Not on file   Highest education level: Not on file  Occupational History   Not on file  Tobacco Use   Smoking status: Never   Smokeless tobacco: Never  Vaping Use   Vaping Use: Never used  Substance and Sexual Activity   Alcohol use: Not Currently   Drug use: Never   Sexual activity: Not on file  Other Topics Concern   Not on file  Social History Narrative   Not on file   Social Determinants of Health   Financial Resource Strain: Not on file  Food Insecurity: Not on file  Transportation Needs: Not on file  Physical Activity: Not on file  Stress: Not on file  Social Connections: Not on file    Review of Systems --unable to obtain due to AMS   Vital Signs: BP 118/66   Pulse 74   Temp 98.4 F (36.9 C) (Oral)   Resp 17   Ht 5' 2"  (1.575 m)   Wt 140 lb 3.4 oz (63.6 kg)   SpO2 95%   BMI 25.65 kg/m   Physical Exam Constitutional:      Appearance: He is ill-appearing.  HENT:     Mouth/Throat:     Mouth: Mucous membranes are moist.     Pharynx: Oropharynx is clear.  Eyes:     Extraocular Movements: Extraocular movements  intact.  Cardiovascular:     Rate and Rhythm: Normal rate and regular rhythm.  Pulmonary:     Effort: Pulmonary effort is normal.  Abdominal:     General: Abdomen is flat.  Musculoskeletal:        General: Tenderness present.     Cervical back: Neck supple.     Comments: Bilateral knees  Skin:    General: Skin is dry.  Neurological:     Mental Status: He is alert. He is confused.  Psychiatric:        Attention and Perception: He is inattentive.        Cognition and Memory: Cognition is impaired. Memory is impaired.  Imaging: CT ABDOMEN PELVIS WO CONTRAST  Result Date: 10/30/2021 CLINICAL DATA:  Fever of unknown origin EXAM: CT ABDOMEN AND PELVIS WITHOUT CONTRAST TECHNIQUE: Multidetector CT imaging of the abdomen and pelvis was performed following the standard protocol without IV contrast. COMPARISON:  January 2021 FINDINGS: Lower chest: Partially imaged left lower lobe consolidative opacity. Additional bibasilar atelectasis. Hepatobiliary: No focal liver abnormality is seen. No gallstones, gallbladder wall thickening, or biliary dilatation. Pancreas: Unremarkable. Spleen: Unremarkable. Adrenals/Urinary Tract: Adrenals, kidneys, and bladder are unremarkable. Stomach/Bowel: Stomach is within normal limits. Bowel is normal in caliber. Vascular/Lymphatic: Mild aortic atherosclerosis. No enlarged lymph nodes. Reproductive: Unremarkable. Other: No free fluid.  Abdominal wall is unremarkable. Musculoskeletal: Lumbar spine degenerative changes. No acute osseous abnormality. IMPRESSION: Partially imaged consolidative opacity left lower lobe may reflect atelectasis or pneumonia. No acute abnormality in the abdomen or pelvis. Electronically Signed   By: Macy Mis M.D.   On: 10/30/2021 14:06   DG Chest 2 View  Result Date: 10/30/2021 CLINICAL DATA:  Fever EXAM: CHEST - 2 VIEW COMPARISON:  10/28/2021 FINDINGS: Transverse diameter of heart is increased. There are no signs of pulmonary edema or  focal pulmonary consolidation. In the immediate previous study there was a possible nodular density described in the medial right upper lung fields. In the current study, there is a cardiac monitoring lead superimposed over the region of possible nodule seen in the previous examination limiting evaluation for a nodule. There is no pleural effusion or pneumothorax. IMPRESSION: Cardiomegaly. There are no signs of pulmonary edema or focal pulmonary consolidation. In the previous study there was a possible nodular density in the medial right upper lung fields which is partly obscured by cardiac monitoring lead limiting comparison. Short-term follow-up chest radiographs and CT if warranted should be considered. Electronically Signed   By: Elmer Picker M.D.   On: 10/30/2021 08:38   DG Knee 1-2 Views Right  Result Date: 11/01/2021 CLINICAL DATA:  Pain EXAM: RIGHT KNEE - 1-2 VIEW COMPARISON:  None. FINDINGS: Enthesopathic changes off the superior patella. No joint effusion. No fracture. No other acute abnormalities. IMPRESSION: Enthesopathic changes off the superior patella. No other abnormalities. Electronically Signed   By: Dorise Bullion III M.D.   On: 11/01/2021 16:26   CT HEAD WO CONTRAST (5MM)  Result Date: 10/28/2021 CLINICAL DATA:  Patient states he cannot: Self up since yesterday. EXAM: CT HEAD WITHOUT CONTRAST CT CERVICAL SPINE WITHOUT CONTRAST TECHNIQUE: Multidetector CT imaging of the head and cervical spine was performed following the standard protocol without intravenous contrast. Multiplanar CT image reconstructions of the cervical spine were also generated. COMPARISON:  CT scan of the brain December 19, 2019. FINDINGS: CT HEAD FINDINGS Brain: Chronic white matter changes are stable. No evidence of acute infarction, hemorrhage, hydrocephalus, extra-axial collection or mass lesion/mass effect. Vascular: Calcified atherosclerosis in the intracranial carotids. Skull: Normal. Negative for  fracture or focal lesion. Sinuses/Orbits: No acute finding. Other: None. CT CERVICAL SPINE FINDINGS Alignment: Normal. Skull base and vertebrae: No acute fracture. No primary bone lesion or focal pathologic process. Soft tissues and spinal canal: No prevertebral fluid or swelling. No visible canal hematoma. Disc levels:  Mild multilevel degenerative disc disease. Upper chest: Negative. Other: No other abnormalities. IMPRESSION: 1. No acute intracranial abnormalities. 2. No fracture or traumatic malalignment in the cervical spine. Electronically Signed   By: Dorise Bullion III M.D.   On: 10/28/2021 19:49   CT CHEST W CONTRAST  Result Date: 11/04/2021 CLINICAL DATA:  Fever. EXAM: CT CHEST  WITH CONTRAST TECHNIQUE: Multidetector CT imaging of the chest was performed during intravenous contrast administration. CONTRAST:  80m OMNIPAQUE IOHEXOL 300 MG/ML  SOLN COMPARISON:  October 31, 2021 FINDINGS: Cardiovascular: There is mild calcification of the aortic arch, without evidence of aortic aneurysm or dissection. Normal heart size with mild coronary artery calcification. No pericardial effusion. Mediastinum/Nodes: There is mild AP window, right paratracheal and right hilar lymphadenopathy. There is stable marked severity narrowing of the right mainstem bronchus (axial CT image 51, CT series 4). The thyroid gland and esophagus demonstrate no significant findings. Lungs/Pleura: Mild areas of atelectasis are seen within the inferior aspect of the left upper lobe and posterior aspects of the bilateral lung bases. This is mildly decreased in severity within the superior segment of the left upper lobe when compared to the prior study and stable in severity throughout the remaining portions of the bilateral lung bases and left upper lobe. A stable 7 mm left upper lobe noncalcified lung nodule is seen (axial CT image 91, CT series 4). Small, stable bilateral pleural effusions are noted. No pneumothorax is identified. Upper  Abdomen: No acute abnormality. Musculoskeletal: No chest wall abnormality. No acute or significant osseous findings. IMPRESSION: 1. Mild areas of left upper lobe and bilateral lower lobe atelectasis and/or infiltrate, mildly decreased in severity within the left lower lobe since the prior study. 2. Small, stable bilateral pleural effusions. 3. Stable 7 mm left upper lobe noncalcified lung nodule. Non-contrast chest CT at 6-12 months is recommended. If the nodule is stable at time of repeat CT, then future CT at 18-24 months (from today's scan) is considered optional for low-risk patients, but is recommended for high-risk patients. This recommendation follows the consensus statement: Guidelines for Management of Incidental Pulmonary Nodules Detected on CT Images: From the Fleischner Society 2017; Radiology 2017; 284:228-243. 4. Stable marked severity narrowing of the right mainstem bronchus. 5. Mild coronary artery disease. 6. Aortic atherosclerosis. Aortic Atherosclerosis (ICD10-I70.0). Electronically Signed   By: TVirgina NorfolkM.D.   On: 11/04/2021 20:23   CT CHEST W CONTRAST  Result Date: 10/31/2021 CLINICAL DATA:  Fever of unknown origin, consolidation left lung base, pulmonary nodule EXAM: CT CHEST WITH CONTRAST TECHNIQUE: Multidetector CT imaging of the chest was performed during intravenous contrast administration. CONTRAST:  854mOMNIPAQUE IOHEXOL 300 MG/ML  SOLN COMPARISON:  10/30/2021, 10/28/2021 FINDINGS: Cardiovascular: The heart is unremarkable without pericardial effusion. No evidence of thoracic aortic aneurysm or dissection. Moderate atherosclerosis of the aortic arch. Mediastinum/Nodes: Borderline enlarged 10 mm short axis lymph nodes are seen within the as ago esophageal recess. No pathologic mediastinal, hilar, or axillary adenopathy. Thyroid gland, trachea, and esophagus demonstrate no significant findings. Lungs/Pleura: Dependent hypoventilatory changes are seen at the lung bases. There  is patchy consolidation within the superior segment left lower lobe and lingula, which may be hypoventilatory or infectious. Trace bilateral pleural effusions are noted. There is no pneumothorax. A single solid 7 mm nodule is seen within the lingular segment left upper lobe, reference image 100/4. No other pulmonary nodules or masses. Specifically, the right upper lobe density seen on recent chest x-ray likely corresponds to overlapping shadows or vascular shadow. Upper Abdomen: No acute abnormality. Musculoskeletal: No acute or destructive bony lesions. Reconstructed images demonstrate no additional findings. IMPRESSION: 1. Patchy consolidation within the superior segment left lower lobe and left upper lobe, which may reflect bronchopneumonia in a patient with a history of fever. Other areas of dependent consolidations within the lower lobes along the posterior costophrenic angles  are likely hypoventilatory. 2. Trace bilateral pleural effusions. 3. 7 mm left solid pulmonary nodule within the upper lobe. Recommend a non-contrast Chest CT at 6-12 months. If patient is high risk for malignancy, recommend an additional non-contrast Chest CT at 18-24 months; if patient is low risk for malignancy a non-contrast Chest CT at 18-24 months is optional. These guidelines do not apply to immunocompromised patients and patients with cancer. Follow up in patients with significant comorbidities as clinically warranted. For lung cancer screening, adhere to Lung-RADS guidelines. Reference: Radiology. 2017; 284(1):228-43. 4.  Aortic Atherosclerosis (ICD10-I70.0). Electronically Signed   By: Randa Ngo M.D.   On: 10/31/2021 15:53   CT Cervical Spine Wo Contrast  Result Date: 10/28/2021 CLINICAL DATA:  Patient states he cannot: Self up since yesterday. EXAM: CT HEAD WITHOUT CONTRAST CT CERVICAL SPINE WITHOUT CONTRAST TECHNIQUE: Multidetector CT imaging of the head and cervical spine was performed following the standard  protocol without intravenous contrast. Multiplanar CT image reconstructions of the cervical spine were also generated. COMPARISON:  CT scan of the brain December 19, 2019. FINDINGS: CT HEAD FINDINGS Brain: Chronic white matter changes are stable. No evidence of acute infarction, hemorrhage, hydrocephalus, extra-axial collection or mass lesion/mass effect. Vascular: Calcified atherosclerosis in the intracranial carotids. Skull: Normal. Negative for fracture or focal lesion. Sinuses/Orbits: No acute finding. Other: None. CT CERVICAL SPINE FINDINGS Alignment: Normal. Skull base and vertebrae: No acute fracture. No primary bone lesion or focal pathologic process. Soft tissues and spinal canal: No prevertebral fluid or swelling. No visible canal hematoma. Disc levels:  Mild multilevel degenerative disc disease. Upper chest: Negative. Other: No other abnormalities. IMPRESSION: 1. No acute intracranial abnormalities. 2. No fracture or traumatic malalignment in the cervical spine. Electronically Signed   By: Dorise Bullion III M.D.   On: 10/28/2021 19:49   MR KNEE LEFT W WO CONTRAST  Result Date: 11/05/2021 CLINICAL DATA:  Septic arthritis suspected, knee, xray done EXAM: MRI OF THE LEFT KNEE WITHOUT AND WITH CONTRAST TECHNIQUE: Multiplanar, multisequence MR imaging of the left knee was performed both before and after administration of intravenous contrast. CONTRAST:  6.47m GADAVIST GADOBUTROL 1 MMOL/ML IV SOLN COMPARISON:  None. FINDINGS: MENISCI Medial: Nondisplaced degenerative tearing of the posterior horn and body of the medial meniscus. Lateral: Intrasubstance degenerative signal without definitive tear. LIGAMENTS Cruciates: ACL and PCL are intact. Collaterals: Medial collateral ligament is intact. Lateral collateral ligament complex is intact. CARTILAGE Patellofemoral: Mild chondrosis. There is a 2 mm intermediate grade focal defect along the medial trochlea (series 3, image 14). Medial: Overall moderate  chondrosis with intermediate grade chondral fissuring along the weight-bearing medial femoral condyle and partial-thickness cartilage loss along the tibial plateau. Lateral:  Mild chondrosis.  No focal defect. JOINT: Small joint effusion with appropriately thin synovial enhancement. POPLITEAL FOSSA: No significant Baker cyst. EXTENSOR MECHANISM: There is intermediate grade, partial width tearing of the central and lateral fibers of the distal quadriceps tendon at its insertion on the patella, and with reactive adjacent edema/cystic change in the patella. The patella tendon appears mildly lax with insertional patellar tendinosis at the patella. BONES: There is no acute fracture or dislocation. There is mild bony edema signal within the fibular head. The cortex is intact. There is no aggressive osseous lesion. Other: There is intramuscular edema signal within the distal vastus medialis and lateralis muscles which is likely reactive in association with the quadriceps tendon tearing. There is also intramuscular edema signal within the proximal aspect of the anterior compartment muscles in  the lower leg, which is likely reactive as well. No focal fluid collection. No abnormal enhancement. IMPRESSION: Tricompartment osteoarthritis, worst in the medial compartment with nondisplaced degenerative tearing of the posterior horn and body of the medial meniscus. Moderate medial compartment chondrosis with intermediate grade chondral fissuring and partial-thickness cartilage loss along the weight-bearing surfaces. Mild patellofemoral chondrosis with 2 mm focal chondral defect along the medial trochlea. Intermediate grade, partial width tearing of the central and lateral fibers of the distal quadriceps tendon at its insertion on the patella. Reactive intramuscular edema within the distal vastus medialis and lateralis muscles as well as the proximal anterior compartment muscles in the lower leg. Moderate proximal insertional  patellar tendinosis. Small joint effusion, which is likely reactive to the above findings. No convincing findings to suggest septic arthritis or osteomyelitis. Electronically Signed   By: Maurine Simmering M.D.   On: 11/05/2021 08:24   DG Chest Port 1 View  Result Date: 10/28/2021 CLINICAL DATA:  Sepsis EXAM: PORTABLE CHEST 1 VIEW COMPARISON:  04/25/2016 FINDINGS: The lungs are symmetrically expanded. No focal pulmonary infiltrate identified. There is a nodular opacity within the right suprahilar region which may represent vascular shadow or a focal pulmonary nodule, but is not we well characterized ll on this examination. This does, however, appear more prominent than on prior examination. No pneumothorax or pleural effusion. Cardiac size is within normal limits. No acute bone abnormality. IMPRESSION: No radiographic evidence of acute cardiopulmonary disease. Nodular opacity within the right suprahilar region, indeterminate. Standard two view chest radiograph may be helpful in excluding and artifactual opacity. Electronically Signed   By: Fidela Salisbury M.D.   On: 10/28/2021 19:07   ECHOCARDIOGRAM COMPLETE  Result Date: 10/29/2021    ECHOCARDIOGRAM REPORT   Patient Name:   DESMUND ELMAN Date of Exam: 10/29/2021 Medical Rec #:  [id number]          Height:       62.0 in Accession #:    [id number]         Weight:       163.0 lb Date of Birth:  Nov 23, 1941          BSA:          1.753 m Patient Age:    43 years           BP:           108/61 mmHg Patient Gender: M                  HR:           117 bpm. Exam Location:  Inpatient Procedure: 2D Echo, Cardiac Doppler and Color Doppler Indications:    Afib  History:        Patient has prior history of Echocardiogram examinations, most                 recent 12/20/2019. Stroke; Risk Factors:Diabetes and                 Hypertension.  Sonographer:    Jyl Heinz Referring Phys: [id number] [place] T TU IMPRESSIONS  1. Left ventricular ejection fraction, by estimation, is 50  to 55%. Left ventricular ejection fraction by 2D MOD biplane is 52.1 %. The left ventricle has low normal function. The left ventricle has no regional wall motion abnormalities. There is moderate left ventricular hypertrophy. Left ventricular diastolic function could not be evaluated.  2. Right ventricular systolic function is low normal. The right ventricular size is  normal. There is normal pulmonary artery systolic pressure. The estimated right ventricular systolic pressure is 35.3 mmHg.  3. The mitral valve is grossly normal. No evidence of mitral valve regurgitation.  4. The aortic valve is tricuspid. Aortic valve regurgitation is not visualized.  5. The inferior vena cava is normal in size with greater than 50% respiratory variability, suggesting right atrial pressure of 3 mmHg. Comparison(s): Changes from prior study are noted. 12/20/2019: LVEF 55-60%. FINDINGS  Left Ventricle: Left ventricular ejection fraction, by estimation, is 50 to 55%. Left ventricular ejection fraction by 2D MOD biplane is 52.1 %. The left ventricle has low normal function. The left ventricle has no regional wall motion abnormalities. The left ventricular internal cavity size was normal in size. There is moderate left ventricular hypertrophy. Left ventricular diastolic function could not be evaluated due to atrial fibrillation. Left ventricular diastolic function could not be evaluated. Right Ventricle: The right ventricular size is normal. No increase in right ventricular wall thickness. Right ventricular systolic function is low normal. There is normal pulmonary artery systolic pressure. The tricuspid regurgitant velocity is 2.66 m/s,  and with an assumed right atrial pressure of 3 mmHg, the estimated right ventricular systolic pressure is 61.4 mmHg. Left Atrium: Left atrial size was normal in size. Right Atrium: Right atrial size was normal in size. Pericardium: There is no evidence of pericardial effusion. Mitral Valve: The mitral  valve is grossly normal. No evidence of mitral valve regurgitation. Tricuspid Valve: The tricuspid valve is grossly normal. Tricuspid valve regurgitation is trivial. Aortic Valve: The aortic valve is tricuspid. Aortic valve regurgitation is not visualized. Aortic valve peak gradient measures 7.6 mmHg. Pulmonic Valve: The pulmonic valve was normal in structure. Pulmonic valve regurgitation is not visualized. Aorta: The aortic root and ascending aorta are structurally normal, with no evidence of dilitation. Venous: The inferior vena cava is normal in size with greater than 50% respiratory variability, suggesting right atrial pressure of 3 mmHg. IAS/Shunts: No atrial level shunt detected by color flow Doppler.  LEFT VENTRICLE PLAX 2D                        Biplane EF (MOD) LVIDd:         4.50 cm         LV Biplane EF:   Left LVIDs:         3.80 cm                          ventricular LV PW:         1.30 cm                          ejection LV IVS:        1.30 cm                          fraction by LVOT diam:     2.00 cm                          2D MOD LV SV:         46                               biplane is LV SV Index:   26  52.1 %. LVOT Area:     3.14 cm                                Diastology                                LV e' medial:    10.40 cm/s LV Volumes (MOD)               LV E/e' medial:  12.6 LV vol d, MOD    83.8 ml       LV e' lateral:   9.25 cm/s A2C:                           LV E/e' lateral: 14.2 LV vol d, MOD    107.0 ml A4C: LV vol s, MOD    39.1 ml A2C: LV vol s, MOD    49.3 ml A4C: LV SV MOD A2C:   44.7 ml LV SV MOD A4C:   107.0 ml LV SV MOD BP:    50.2 ml RIGHT VENTRICLE             IVC RV Basal diam:  2.40 cm     IVC diam: 1.80 cm RV Mid diam:    1.90 cm RV S prime:     10.00 cm/s TAPSE (M-mode): 1.7 cm LEFT ATRIUM             Index        RIGHT ATRIUM           Index LA diam:        2.90 cm 1.65 cm/m   RA Area:     11.50 cm LA Vol (A2C):   25.8 ml 14.72  ml/m  RA Volume:   20.80 ml  11.87 ml/m LA Vol (A4C):   44.8 ml 25.56 ml/m LA Biplane Vol: 35.6 ml 20.31 ml/m  AORTIC VALVE AV Area (Vmax): 2.23 cm AV Vmax:        138.00 cm/s AV Peak Grad:   7.6 mmHg LVOT Vmax:      97.90 cm/s LVOT Vmean:     77.500 cm/s LVOT VTI:       0.146 m  AORTA Ao Root diam: 3.70 cm Ao Asc diam:  3.40 cm MITRAL VALVE                TRICUSPID VALVE MV Area (PHT): 7.66 cm     TR Peak grad:   28.3 mmHg MV Decel Time: 99 msec      TR Vmax:        266.00 cm/s MV E velocity: 131.00 cm/s MV A velocity: 44.10 cm/s   SHUNTS MV E/A ratio:  2.97         Systemic VTI:  0.15 m                             Systemic Diam: 2.00 cm Lyman Bishop MD Electronically signed by Lyman Bishop MD Signature Date/Time: 10/29/2021/11:08:47 AM    Final     Labs:  CBC: Recent Labs    11/02/21 0131 11/03/21 0130 11/04/21 0748 11/05/21 0227 11/05/21 1124  WBC 3.8* 3.0* 2.7* 2.7*  --   HGB 8.3* 7.8* 7.6* 7.8*  --   HCT 25.2*  22.7* 23.0* 23.0*  --   PLT 52* 46* 53* 57* 59*    COAGS: Recent Labs    10/28/21 1808  INR 1.3*  APTT 35    BMP: Recent Labs    11/02/21 0131 11/03/21 0130 11/04/21 0748 11/05/21 0227  NA 135 135 137 138  K 3.7 3.6 3.8 3.7  CL 102 105 108 109  CO2 25 24 22 24   GLUCOSE 120* 142* 112* 94  BUN 15 17 15 13   CALCIUM 7.9* 7.8* 7.7* 7.8*  CREATININE 1.02 1.04 0.89 0.96  GFRNONAA >60 >60 >60 >60    LIVER FUNCTION TESTS: Recent Labs    10/28/21 1808 10/31/21 0116 11/01/21 0213 11/02/21 0131 11/03/21 0130 11/04/21 0748 11/05/21 0227  BILITOT 1.0 1.1 0.9  --   --   --  0.9  AST 26 16 31   --   --   --  20  ALT 23 18 35  --   --   --  36  ALKPHOS 78 55 60  --   --   --  90  PROT 5.8* 4.5* 4.4*  --   --   --  4.5*  ALBUMIN 2.8* 1.8* 1.8* 1.9* 1.8* 1.7* 1.8*     Assessment and Plan:  Thrombocytopenia/Leukopenia --Will plan to proceed with Bone marrow biopsy --need to contact son (who will also require interpreter) for consent. --Tentatively  scheduled for Thursday 12/1.   Thank you for this interesting consult.  I greatly enjoyed meeting [facility] and look forward to participating in their care.  A copy of this report was sent to the requesting provider on this date.  Electronically Signed: Pasty Spillers, PA 11/05/2021, 3:26 PM   I spent a total of 30 minutes in face to face in clinical consultation, greater than 50% of which was counseling/coordinating care for bone marrow biopsy.

## 2021-11-05 NOTE — Consult Note (Addendum)
[place]  [phone] [phone]    [place]  Referring MD:  Dr. Wendee Beavers  Reason for Referral: Pancytopenia  HPI: Ms. Baldree is an 80 year old male with a past medical history significant for CVA, left bundle branch block, type 2 diabetes mellitus, hyperlipidemia.  He presented to the emergency department with concerns of altered mental status and labored respiration.  His son accompanied him to the emergency department and provided the history.  Prior to admission, he had a decreased appetite and was incontinent of urine.  Initial lab work showed WBC is 7.1, hemoglobin 6.4, platelet count 101,000.  His most recent CBC available to me was performed on 10/03/2021 at an outside hospital which showed a WBC of 4.6, hemoglobin 12.3, platelet count of 113,000.  He has received a total of 3 units PRBC so far this admission.  Additional work-up of his anemia included a vitamin B12 level which was elevated at 3633, normal folate level of 14, elevated ferritin at 902 with low iron of 15 and low percent saturation of 7%.  His TIBC was also low at 209.  Absolute reticulocyte count was low at 16.9 and immature reticulocyte fraction was elevated 19.8%.  Stool for occult blood was positive this admission.  He underwent EGD on 10/29/2021 which showed erosive gastropathy with no bleeding and no stigmata of recent bleeding and gastritis. This morning his CBC showed a WBC of 2.7, hemoglobin 7.8, platelet count 57,000 with a pending differential.  Labs reviewed and he has not received Lovenox or heparin this admission. His BUN and creatinine, LFTs, T bili are all normal.  Due to fevers, he had imaging studies including a CT of the abdomen/pelvis on 10/30/2021 which showed a partially imaged consolidative opacity in the left lower lobe which could reflect atelectasis or pneumonia otherwise no acute abnormality in the abdomen or pelvis.  He had a CT of the  chest performed on 10/31/2021 which showed patchy consolidation within the superior segment of the left lower lobe and left upper lobe which could reflect bronchopneumonia, trace pleural effusions, 7 mm left solid pulmonary nodule within the upper lobe.  He has been on broad-spectrum antibiotics including vancomycin, metronidazole, and cefepime then followed by vancomycin and cefepime and then Unasyn and doxycycline.  ID has been consulted and there was some concern for potential [facility] spotted fever, but since he continues to be febrile on doxycycline, unclear if this is a consideration at this time.  Due to concern for ongoing fever despite multiple antibiotics, a repeat CT of the chest was performed on 11/04/2021 which continues to show mild areas of left upper lobe and bilateral lobe atelectasis and/or infiltrate which were mildly decreased compared to the prior scan.  The patient has also been complaining of left knee pain and MRI of the left knee was obtained earlier today which showed a small joint effusion which is likely reactive and there were no convincing findings to suggest septic arthritis or osteomyelitis.  I met with the patient in his hospital room.  No family at the bedside.  A video interpreter was used for this visit.  The patient reports that he has been having intermittent fevers and chills.  He has not had any fevers over the past 24 hours.  He currently complains of bilateral knee/leg pain.  His right leg is worse than the left.  He is not having any headaches or dizziness.  Denies anorexia and weight loss.  Denies chest  pain and shortness of breath.  He reports intermittent cough.  Denies hemoptysis.  Denies abdominal pain, nausea, vomiting.  He reports intermittent epistaxis but no other bleeding such as hematuria, melena, hematochezia.  He denies rashes.  The patient states that he has never had a blood transfusion prior to this admission.  No family history of blood disorders  reported.  Hematology was asked see patient recommendations regarding his pancytopenia.  Past Medical History:  Diagnosis Date   CKD (chronic kidney disease), stage III ([place])    Diabetes ([place])    Hyperlipemia    Hypertension    LBBB (left bundle branch block)    Stroke [facility])   :     Past Surgical History:  Procedure Laterality Date   BIOPSY  10/29/2021   Procedure: BIOPSY;  Surgeon: Thornton Park, MD;  Location: [place];  Service: Gastroenterology;;   ESOPHAGOGASTRODUODENOSCOPY (EGD) WITH PROPOFOL N/A 10/29/2021   Procedure: ESOPHAGOGASTRODUODENOSCOPY (EGD) WITH PROPOFOL;  Surgeon: Thornton Park, MD;  Location: [place];  Service: Gastroenterology;  Laterality: N/A;  :   CURRENT MEDS: Current Facility-Administered Medications  Medication Dose Route Frequency Provider Last Rate Last Admin   0.9 %  sodium chloride infusion   Intravenous Continuous Kristopher Oppenheim, DO 100 mL/hr at 11/05/21 0558 New Bag at 11/05/21 0558   acetaminophen (TYLENOL) tablet 650 mg  650 mg Oral Q6H PRN Reome, Craig Guess, RPH   650 mg at 11/05/21 1950   Or   acetaminophen (TYLENOL) suppository 650 mg  650 mg Rectal Q6H PRN Reome, Earle J, RPH       allopurinol (ZYLOPRIM) tablet 150 mg  150 mg Oral Daily Regalado, Belkys A, MD   150 mg at 11/05/21 9326   colchicine tablet 0.6 mg  0.6 mg Oral BID Tommy Medal, Lavell Islam, MD   0.6 mg at 11/05/21 7124   diclofenac Sodium (VOLTAREN) 1 % topical gel 2 g  2 g Topical QID Wendee Beavers T, MD   2 g at 11/05/21 0835   ibuprofen (ADVIL) tablet 400 mg  400 mg Oral Q4H PRN Mansy, Jan A, MD   400 mg at 11/02/21 2117   insulin aspart (novoLOG) injection 0-5 Units  0-5 Units Subcutaneous QHS Gonfa, Taye T, MD       insulin aspart (novoLOG) injection 0-9 Units  0-9 Units Subcutaneous TID WC Wendee Beavers T, MD   1 Units at 11/03/21 1615   insulin glargine-yfgn (SEMGLEE) injection 5 Units  5 Units Subcutaneous Daily Wendee Beavers T, MD   5 Units at 11/05/21 0835    metoprolol tartrate (LOPRESSOR) injection 2.5 mg  2.5 mg Intravenous Q6H PRN Regalado, Belkys A, MD       pantoprazole (PROTONIX) injection 40 mg  40 mg Intravenous Q12H Regalado, Belkys A, MD   40 mg at 11/05/21 5809   QUEtiapine (SEROQUEL) tablet 25 mg  25 mg Oral QHS Wendee Beavers T, MD   25 mg at 11/04/21 2053   traZODone (DESYREL) tablet 50 mg  50 mg Oral QHS PRN Chotiner, Yevonne Aline, MD   50 mg at 11/04/21 2053      No Known Allergies:   Family History  Problem Relation Age of Onset   Stroke Mother    Lung disease Father    Diabetes Sister    Heart disease Sister    Hyperlipidemia Son    Hyperlipidemia Daughter    Hypertension Daughter   :   Social History   Socioeconomic History   Marital status:  Single    Spouse name: Not on file   Number of children: Not on file   Years of education: Not on file   Highest education level: Not on file  Occupational History   Not on file  Tobacco Use   Smoking status: Never   Smokeless tobacco: Never  Vaping Use   Vaping Use: Never used  Substance and Sexual Activity   Alcohol use: Not Currently   Drug use: Never   Sexual activity: Not on file  Other Topics Concern   Not on file  Social History Narrative   Not on file   Social Determinants of Health   Financial Resource Strain: Not on file  Food Insecurity: Not on file  Transportation Needs: Not on file  Physical Activity: Not on file  Stress: Not on file  Social Connections: Not on file  Intimate Partner Violence: Not on file  :  REVIEW OF SYSTEMS:  A comprehensive 14 point review of systems was negative except as noted in the HPI.    Exam: Patient Vitals for the past 24 hrs:  BP Temp Temp src Pulse Resp SpO2  11/05/21 1129 124/72 98.4 F (36.9 C) Oral 68 18 93 %  11/05/21 0731 123/66 98 F (36.7 C) Oral 67 17 97 %  11/05/21 0600 108/73 98.5 F (36.9 C) Oral 71 16 95 %  11/05/21 0400 113/63 98.7 F (37.1 C) Oral 72 13 94 %  11/05/21 0200 112/67 -- -- 75 16  97 %  11/05/21 0000 115/62 -- -- 72 14 97 %  11/04/21 2352 97/70 98.8 F (37.1 C) Oral 76 18 97 %  11/04/21 1926 120/69 98.7 F (37.1 C) Oral 72 18 95 %  11/04/21 1600 (!) 95/59 -- -- 75 16 93 %  11/04/21 1400 111/73 -- -- 74 20 95 %  11/04/21 1212 104/62 98.8 F (37.1 C) Oral 75 20 94 %    General: The patient is awake and alert, no distress. Eyes:  no scleral icterus.   ENT:  There were no oropharyngeal lesions.    Lymphatics:  Negative cervical, supraclavicular or axillary adenopathy.   Respiratory: lungs were clear bilaterally without wheezing or crackles.   Cardiovascular:  Regular rate and rhythm, S1/S2, without murmur, rub or gallop.  There was no pedal edema.   GI:  abdomen was soft, flat, nontender, nondistended, without organomegaly.   Musculoskeletal:  no spinal tenderness of palpation of vertebral spine.   Skin exam was without ecchymosis, petechiae.   Neuro exam was nonfocal.     LABS:  Lab Results  Component Value Date   WBC 2.7 (L) 11/05/2021   HGB 7.8 (L) 11/05/2021   HCT 23.0 (L) 11/05/2021   PLT 57 (L) 11/05/2021   GLUCOSE 94 11/05/2021   CHOL 215 (H) 05/16/2020   TRIG 289 (H) 05/16/2020   HDL 61 05/16/2020   LDLCALC 105 (H) 05/16/2020   ALT 36 11/05/2021   AST 20 11/05/2021   NA 138 11/05/2021   K 3.7 11/05/2021   CL 109 11/05/2021   CREATININE 0.96 11/05/2021   BUN 13 11/05/2021   CO2 24 11/05/2021   INR 1.3 (H) 10/28/2021   HGBA1C 7.5 (H) 10/29/2021    CT ABDOMEN PELVIS WO CONTRAST  Result Date: 10/30/2021 CLINICAL DATA:  Fever of unknown origin EXAM: CT ABDOMEN AND PELVIS WITHOUT CONTRAST TECHNIQUE: Multidetector CT imaging of the abdomen and pelvis was performed following the standard protocol without IV contrast. COMPARISON:  January 2021 FINDINGS: Lower  chest: Partially imaged left lower lobe consolidative opacity. Additional bibasilar atelectasis. Hepatobiliary: No focal liver abnormality is seen. No gallstones, gallbladder wall  thickening, or biliary dilatation. Pancreas: Unremarkable. Spleen: Unremarkable. Adrenals/Urinary Tract: Adrenals, kidneys, and bladder are unremarkable. Stomach/Bowel: Stomach is within normal limits. Bowel is normal in caliber. Vascular/Lymphatic: Mild aortic atherosclerosis. No enlarged lymph nodes. Reproductive: Unremarkable. Other: No free fluid.  Abdominal wall is unremarkable. Musculoskeletal: Lumbar spine degenerative changes. No acute osseous abnormality. IMPRESSION: Partially imaged consolidative opacity left lower lobe may reflect atelectasis or pneumonia. No acute abnormality in the abdomen or pelvis. Electronically Signed   By: Macy Mis M.D.   On: 10/30/2021 14:06   DG Chest 2 View  Result Date: 10/30/2021 CLINICAL DATA:  Fever EXAM: CHEST - 2 VIEW COMPARISON:  10/28/2021 FINDINGS: Transverse diameter of heart is increased. There are no signs of pulmonary edema or focal pulmonary consolidation. In the immediate previous study there was a possible nodular density described in the medial right upper lung fields. In the current study, there is a cardiac monitoring lead superimposed over the region of possible nodule seen in the previous examination limiting evaluation for a nodule. There is no pleural effusion or pneumothorax. IMPRESSION: Cardiomegaly. There are no signs of pulmonary edema or focal pulmonary consolidation. In the previous study there was a possible nodular density in the medial right upper lung fields which is partly obscured by cardiac monitoring lead limiting comparison. Short-term follow-up chest radiographs and CT if warranted should be considered. Electronically Signed   By: Elmer Picker M.D.   On: 10/30/2021 08:38   DG Knee 1-2 Views Right  Result Date: 11/01/2021 CLINICAL DATA:  Pain EXAM: RIGHT KNEE - 1-2 VIEW COMPARISON:  None. FINDINGS: Enthesopathic changes off the superior patella. No joint effusion. No fracture. No other acute abnormalities. IMPRESSION:  Enthesopathic changes off the superior patella. No other abnormalities. Electronically Signed   By: Dorise Bullion III M.D.   On: 11/01/2021 16:26   CT HEAD WO CONTRAST (5MM)  Result Date: 10/28/2021 CLINICAL DATA:  Patient states he cannot: Self up since yesterday. EXAM: CT HEAD WITHOUT CONTRAST CT CERVICAL SPINE WITHOUT CONTRAST TECHNIQUE: Multidetector CT imaging of the head and cervical spine was performed following the standard protocol without intravenous contrast. Multiplanar CT image reconstructions of the cervical spine were also generated. COMPARISON:  CT scan of the brain December 19, 2019. FINDINGS: CT HEAD FINDINGS Brain: Chronic white matter changes are stable. No evidence of acute infarction, hemorrhage, hydrocephalus, extra-axial collection or mass lesion/mass effect. Vascular: Calcified atherosclerosis in the intracranial carotids. Skull: Normal. Negative for fracture or focal lesion. Sinuses/Orbits: No acute finding. Other: None. CT CERVICAL SPINE FINDINGS Alignment: Normal. Skull base and vertebrae: No acute fracture. No primary bone lesion or focal pathologic process. Soft tissues and spinal canal: No prevertebral fluid or swelling. No visible canal hematoma. Disc levels:  Mild multilevel degenerative disc disease. Upper chest: Negative. Other: No other abnormalities. IMPRESSION: 1. No acute intracranial abnormalities. 2. No fracture or traumatic malalignment in the cervical spine. Electronically Signed   By: Dorise Bullion III M.D.   On: 10/28/2021 19:49   CT CHEST W CONTRAST  Result Date: 11/04/2021 CLINICAL DATA:  Fever. EXAM: CT CHEST WITH CONTRAST TECHNIQUE: Multidetector CT imaging of the chest was performed during intravenous contrast administration. CONTRAST:  85m OMNIPAQUE IOHEXOL 300 MG/ML  SOLN COMPARISON:  October 31, 2021 FINDINGS: Cardiovascular: There is mild calcification of the aortic arch, without evidence of aortic aneurysm or dissection. Normal heart  size with  mild coronary artery calcification. No pericardial effusion. Mediastinum/Nodes: There is mild AP window, right paratracheal and right hilar lymphadenopathy. There is stable marked severity narrowing of the right mainstem bronchus (axial CT image 51, CT series 4). The thyroid gland and esophagus demonstrate no significant findings. Lungs/Pleura: Mild areas of atelectasis are seen within the inferior aspect of the left upper lobe and posterior aspects of the bilateral lung bases. This is mildly decreased in severity within the superior segment of the left upper lobe when compared to the prior study and stable in severity throughout the remaining portions of the bilateral lung bases and left upper lobe. A stable 7 mm left upper lobe noncalcified lung nodule is seen (axial CT image 91, CT series 4). Small, stable bilateral pleural effusions are noted. No pneumothorax is identified. Upper Abdomen: No acute abnormality. Musculoskeletal: No chest wall abnormality. No acute or significant osseous findings. IMPRESSION: 1. Mild areas of left upper lobe and bilateral lower lobe atelectasis and/or infiltrate, mildly decreased in severity within the left lower lobe since the prior study. 2. Small, stable bilateral pleural effusions. 3. Stable 7 mm left upper lobe noncalcified lung nodule. Non-contrast chest CT at 6-12 months is recommended. If the nodule is stable at time of repeat CT, then future CT at 18-24 months (from today's scan) is considered optional for low-risk patients, but is recommended for high-risk patients. This recommendation follows the consensus statement: Guidelines for Management of Incidental Pulmonary Nodules Detected on CT Images: From the Fleischner Society 2017; Radiology 2017; 284:228-243. 4. Stable marked severity narrowing of the right mainstem bronchus. 5. Mild coronary artery disease. 6. Aortic atherosclerosis. Aortic Atherosclerosis (ICD10-I70.0). Electronically Signed   By: Virgina Norfolk M.D.    On: 11/04/2021 20:23   CT CHEST W CONTRAST  Result Date: 10/31/2021 CLINICAL DATA:  Fever of unknown origin, consolidation left lung base, pulmonary nodule EXAM: CT CHEST WITH CONTRAST TECHNIQUE: Multidetector CT imaging of the chest was performed during intravenous contrast administration. CONTRAST:  72m OMNIPAQUE IOHEXOL 300 MG/ML  SOLN COMPARISON:  10/30/2021, 10/28/2021 FINDINGS: Cardiovascular: The heart is unremarkable without pericardial effusion. No evidence of thoracic aortic aneurysm or dissection. Moderate atherosclerosis of the aortic arch. Mediastinum/Nodes: Borderline enlarged 10 mm short axis lymph nodes are seen within the as ago esophageal recess. No pathologic mediastinal, hilar, or axillary adenopathy. Thyroid gland, trachea, and esophagus demonstrate no significant findings. Lungs/Pleura: Dependent hypoventilatory changes are seen at the lung bases. There is patchy consolidation within the superior segment left lower lobe and lingula, which may be hypoventilatory or infectious. Trace bilateral pleural effusions are noted. There is no pneumothorax. A single solid 7 mm nodule is seen within the lingular segment left upper lobe, reference image 100/4. No other pulmonary nodules or masses. Specifically, the right upper lobe density seen on recent chest x-ray likely corresponds to overlapping shadows or vascular shadow. Upper Abdomen: No acute abnormality. Musculoskeletal: No acute or destructive bony lesions. Reconstructed images demonstrate no additional findings. IMPRESSION: 1. Patchy consolidation within the superior segment left lower lobe and left upper lobe, which may reflect bronchopneumonia in a patient with a history of fever. Other areas of dependent consolidations within the lower lobes along the posterior costophrenic angles are likely hypoventilatory. 2. Trace bilateral pleural effusions. 3. 7 mm left solid pulmonary nodule within the upper lobe. Recommend a non-contrast Chest CT  at 6-12 months. If patient is high risk for malignancy, recommend an additional non-contrast Chest CT at 18-24 months; if patient is low  risk for malignancy a non-contrast Chest CT at 18-24 months is optional. These guidelines do not apply to immunocompromised patients and patients with cancer. Follow up in patients with significant comorbidities as clinically warranted. For lung cancer screening, adhere to Lung-RADS guidelines. Reference: Radiology. 2017; 284(1):228-43. 4.  Aortic Atherosclerosis (ICD10-I70.0). Electronically Signed   By: Randa Ngo M.D.   On: 10/31/2021 15:53   CT Cervical Spine Wo Contrast  Result Date: 10/28/2021 CLINICAL DATA:  Patient states he cannot: Self up since yesterday. EXAM: CT HEAD WITHOUT CONTRAST CT CERVICAL SPINE WITHOUT CONTRAST TECHNIQUE: Multidetector CT imaging of the head and cervical spine was performed following the standard protocol without intravenous contrast. Multiplanar CT image reconstructions of the cervical spine were also generated. COMPARISON:  CT scan of the brain December 19, 2019. FINDINGS: CT HEAD FINDINGS Brain: Chronic white matter changes are stable. No evidence of acute infarction, hemorrhage, hydrocephalus, extra-axial collection or mass lesion/mass effect. Vascular: Calcified atherosclerosis in the intracranial carotids. Skull: Normal. Negative for fracture or focal lesion. Sinuses/Orbits: No acute finding. Other: None. CT CERVICAL SPINE FINDINGS Alignment: Normal. Skull base and vertebrae: No acute fracture. No primary bone lesion or focal pathologic process. Soft tissues and spinal canal: No prevertebral fluid or swelling. No visible canal hematoma. Disc levels:  Mild multilevel degenerative disc disease. Upper chest: Negative. Other: No other abnormalities. IMPRESSION: 1. No acute intracranial abnormalities. 2. No fracture or traumatic malalignment in the cervical spine. Electronically Signed   By: Dorise Bullion III M.D.   On: 10/28/2021  19:49   MR KNEE LEFT W WO CONTRAST  Result Date: 11/05/2021 CLINICAL DATA:  Septic arthritis suspected, knee, xray done EXAM: MRI OF THE LEFT KNEE WITHOUT AND WITH CONTRAST TECHNIQUE: Multiplanar, multisequence MR imaging of the left knee was performed both before and after administration of intravenous contrast. CONTRAST:  6.31m GADAVIST GADOBUTROL 1 MMOL/ML IV SOLN COMPARISON:  None. FINDINGS: MENISCI Medial: Nondisplaced degenerative tearing of the posterior horn and body of the medial meniscus. Lateral: Intrasubstance degenerative signal without definitive tear. LIGAMENTS Cruciates: ACL and PCL are intact. Collaterals: Medial collateral ligament is intact. Lateral collateral ligament complex is intact. CARTILAGE Patellofemoral: Mild chondrosis. There is a 2 mm intermediate grade focal defect along the medial trochlea (series 3, image 14). Medial: Overall moderate chondrosis with intermediate grade chondral fissuring along the weight-bearing medial femoral condyle and partial-thickness cartilage loss along the tibial plateau. Lateral:  Mild chondrosis.  No focal defect. JOINT: Small joint effusion with appropriately thin synovial enhancement. POPLITEAL FOSSA: No significant Baker cyst. EXTENSOR MECHANISM: There is intermediate grade, partial width tearing of the central and lateral fibers of the distal quadriceps tendon at its insertion on the patella, and with reactive adjacent edema/cystic change in the patella. The patella tendon appears mildly lax with insertional patellar tendinosis at the patella. BONES: There is no acute fracture or dislocation. There is mild bony edema signal within the fibular head. The cortex is intact. There is no aggressive osseous lesion. Other: There is intramuscular edema signal within the distal vastus medialis and lateralis muscles which is likely reactive in association with the quadriceps tendon tearing. There is also intramuscular edema signal within the proximal aspect  of the anterior compartment muscles in the lower leg, which is likely reactive as well. No focal fluid collection. No abnormal enhancement. IMPRESSION: Tricompartment osteoarthritis, worst in the medial compartment with nondisplaced degenerative tearing of the posterior horn and body of the medial meniscus. Moderate medial compartment chondrosis with intermediate grade chondral fissuring  and partial-thickness cartilage loss along the weight-bearing surfaces. Mild patellofemoral chondrosis with 2 mm focal chondral defect along the medial trochlea. Intermediate grade, partial width tearing of the central and lateral fibers of the distal quadriceps tendon at its insertion on the patella. Reactive intramuscular edema within the distal vastus medialis and lateralis muscles as well as the proximal anterior compartment muscles in the lower leg. Moderate proximal insertional patellar tendinosis. Small joint effusion, which is likely reactive to the above findings. No convincing findings to suggest septic arthritis or osteomyelitis. Electronically Signed   By: Maurine Simmering M.D.   On: 11/05/2021 08:24   DG Chest Port 1 View  Result Date: 10/28/2021 CLINICAL DATA:  Sepsis EXAM: PORTABLE CHEST 1 VIEW COMPARISON:  04/25/2016 FINDINGS: The lungs are symmetrically expanded. No focal pulmonary infiltrate identified. There is a nodular opacity within the right suprahilar region which may represent vascular shadow or a focal pulmonary nodule, but is not we well characterized ll on this examination. This does, however, appear more prominent than on prior examination. No pneumothorax or pleural effusion. Cardiac size is within normal limits. No acute bone abnormality. IMPRESSION: No radiographic evidence of acute cardiopulmonary disease. Nodular opacity within the right suprahilar region, indeterminate. Standard two view chest radiograph may be helpful in excluding and artifactual opacity. Electronically Signed   By: Fidela Salisbury  M.D.   On: 10/28/2021 19:07   ECHOCARDIOGRAM COMPLETE  Result Date: 10/29/2021    ECHOCARDIOGRAM REPORT   Patient Name:   Eugene Cooper Date of Exam: 10/29/2021 Medical Rec #:  [id number]          Height:       62.0 in Accession #:    [id number]         Weight:       163.0 lb Date of Birth:  1941/07/17          BSA:          1.753 m Patient Age:    67 years           BP:           108/61 mmHg Patient Gender: M                  HR:           117 bpm. Exam Location:  Inpatient Procedure: 2D Echo, Cardiac Doppler and Color Doppler Indications:    Afib  History:        Patient has prior history of Echocardiogram examinations, most                 recent 12/20/2019. Stroke; Risk Factors:Diabetes and                 Hypertension.  Sonographer:    Jyl Heinz Referring Phys: [id number] [place] T TU IMPRESSIONS  1. Left ventricular ejection fraction, by estimation, is 50 to 55%. Left ventricular ejection fraction by 2D MOD biplane is 52.1 %. The left ventricle has low normal function. The left ventricle has no regional wall motion abnormalities. There is moderate left ventricular hypertrophy. Left ventricular diastolic function could not be evaluated.  2. Right ventricular systolic function is low normal. The right ventricular size is normal. There is normal pulmonary artery systolic pressure. The estimated right ventricular systolic pressure is 50.2 mmHg.  3. The mitral valve is grossly normal. No evidence of mitral valve regurgitation.  4. The aortic valve is tricuspid. Aortic valve regurgitation is not visualized.  5. The inferior  vena cava is normal in size with greater than 50% respiratory variability, suggesting right atrial pressure of 3 mmHg. Comparison(s): Changes from prior study are noted. 12/20/2019: LVEF 55-60%. FINDINGS  Left Ventricle: Left ventricular ejection fraction, by estimation, is 50 to 55%. Left ventricular ejection fraction by 2D MOD biplane is 52.1 %. The left ventricle has low normal function.  The left ventricle has no regional wall motion abnormalities. The left ventricular internal cavity size was normal in size. There is moderate left ventricular hypertrophy. Left ventricular diastolic function could not be evaluated due to atrial fibrillation. Left ventricular diastolic function could not be evaluated. Right Ventricle: The right ventricular size is normal. No increase in right ventricular wall thickness. Right ventricular systolic function is low normal. There is normal pulmonary artery systolic pressure. The tricuspid regurgitant velocity is 2.66 m/s,  and with an assumed right atrial pressure of 3 mmHg, the estimated right ventricular systolic pressure is 81.5 mmHg. Left Atrium: Left atrial size was normal in size. Right Atrium: Right atrial size was normal in size. Pericardium: There is no evidence of pericardial effusion. Mitral Valve: The mitral valve is grossly normal. No evidence of mitral valve regurgitation. Tricuspid Valve: The tricuspid valve is grossly normal. Tricuspid valve regurgitation is trivial. Aortic Valve: The aortic valve is tricuspid. Aortic valve regurgitation is not visualized. Aortic valve peak gradient measures 7.6 mmHg. Pulmonic Valve: The pulmonic valve was normal in structure. Pulmonic valve regurgitation is not visualized. Aorta: The aortic root and ascending aorta are structurally normal, with no evidence of dilitation. Venous: The inferior vena cava is normal in size with greater than 50% respiratory variability, suggesting right atrial pressure of 3 mmHg. IAS/Shunts: No atrial level shunt detected by color flow Doppler.  LEFT VENTRICLE PLAX 2D                        Biplane EF (MOD) LVIDd:         4.50 cm         LV Biplane EF:   Left LVIDs:         3.80 cm                          ventricular LV PW:         1.30 cm                          ejection LV IVS:        1.30 cm                          fraction by LVOT diam:     2.00 cm                          2D MOD LV  SV:         46                               biplane is LV SV Index:   26                               52.1 %. LVOT Area:     3.14 cm  Diastology                                LV e' medial:    10.40 cm/s LV Volumes (MOD)               LV E/e' medial:  12.6 LV vol d, MOD    83.8 ml       LV e' lateral:   9.25 cm/s A2C:                           LV E/e' lateral: 14.2 LV vol d, MOD    107.0 ml A4C: LV vol s, MOD    39.1 ml A2C: LV vol s, MOD    49.3 ml A4C: LV SV MOD A2C:   44.7 ml LV SV MOD A4C:   107.0 ml LV SV MOD BP:    50.2 ml RIGHT VENTRICLE             IVC RV Basal diam:  2.40 cm     IVC diam: 1.80 cm RV Mid diam:    1.90 cm RV S prime:     10.00 cm/s TAPSE (M-mode): 1.7 cm LEFT ATRIUM             Index        RIGHT ATRIUM           Index LA diam:        2.90 cm 1.65 cm/m   RA Area:     11.50 cm LA Vol (A2C):   25.8 ml 14.72 ml/m  RA Volume:   20.80 ml  11.87 ml/m LA Vol (A4C):   44.8 ml 25.56 ml/m LA Biplane Vol: 35.6 ml 20.31 ml/m  AORTIC VALVE AV Area (Vmax): 2.23 cm AV Vmax:        138.00 cm/s AV Peak Grad:   7.6 mmHg LVOT Vmax:      97.90 cm/s LVOT Vmean:     77.500 cm/s LVOT VTI:       0.146 m  AORTA Ao Root diam: 3.70 cm Ao Asc diam:  3.40 cm MITRAL VALVE                TRICUSPID VALVE MV Area (PHT): 7.66 cm     TR Peak grad:   28.3 mmHg MV Decel Time: 99 msec      TR Vmax:        266.00 cm/s MV E velocity: 131.00 cm/s MV A velocity: 44.10 cm/s   SHUNTS MV E/A ratio:  2.97         Systemic VTI:  0.15 m                             Systemic Diam: 2.00 cm Lyman Bishop MD Electronically signed by Lyman Bishop MD Signature Date/Time: 10/29/2021/11:08:47 AM    Final      ASSESSMENT AND PLAN: This is an 80 year old male with 1.  Pancytopenia 2.  Sepsis secondary to pneumonia 3.  Anemia secondary to GI blood loss secondary to erosive gastropathy/gastritis 4.  CKD 5.  Acute metabolic encephalopathy/delirium, improving 6.  Diabetes mellitus 7.  7 mm left upper  lobe lung nodule, stable 8.  History of CVA 9.  Left knee pain/?  Gout  -Review of patient's lab work show that he has had at least mild  pancytopenia dating back to at least 09/23/2021 (last performed at an outside hospital).  However, pancytopenia has worsened this admission.  He has not had any recent heparin or Lovenox so HIT unlikely.  He has no evidence of hemolysis currently on his lab work, but LDH and haptoglobin are pending.  Given the significant worsening of his labs during his hospitalization, pancytopenia likely due to bone marrow suppression from sepsis/acute infection, polypharmacy, and recent GI bleed.  Potential medications include vancomycin and Unasyn.  Additionally, quetiapine can also cause thrombocytopenia. -The patient's iron level and percent saturation level are both low.  May benefit from iron supplementation. -Recommend close monitoring of his lab work.  Transfuse PRBCs for hemoglobin less than 8 and transfuse platelets for platelet count less than 20,000 or active bleeding. -If counts do not start to recover, will consider bone marrow biopsy.  Thank you for this referral.  Mikey Bussing, DNP, AGPCNP-BC, AOCNP  Addendum I have seen the patient, examined him. I agree with the assessment and and plan and have edited the notes.   80 yo male with PMH of CVA, left bundle branch block, type 2 diabetes mellitus, hyperlipidemia, was admitted on 10/28/2021 for sepsis, AF and AKI.  He was found to have severe anemia with hemoglobin 6.4 on admission, and platelet count 104K, with normal WBC.  His CBC was completely normal in January 2021.  Outside lab from September 23, 2021 showed very mild pancytopenia. He developed leukopenia and worsening thrombocytopenia since admission. Pt is still confused, but afebrile now, has not responded well to antibiotics so far.  Anemia work-up was negative for nutritional anemia, with low reticulocyte count, no evidence of hemolysis. Images so far  negative for lymphadenopathy or solid organ malignancy.  I personally reviewed his peripheral blood smear today which was unremarkable except cytopenias. I think his anemia is probably related to his infection, although ID felt  Ehrlichia or [facility] spotted fever are less likely due to the lack of response to doxycycline.  ID has ordered a bone marrow biopsy, which will be done on 12/1. I agree with BM biopsy, to rule out primary bone marrow disease such as lymphoma, leukemia etc, although the clinical course is not very typical. Please continue blood transfusion to keep Hg>7, plt>=20K. We will f/u after BM biopsy result is back. I will order MM lab work also.   Truitt Merle  11/05/2021

## 2021-11-05 NOTE — Progress Notes (Addendum)
Subjective:  No complaints  Antibiotics:  Anti-infectives (From admission, onward)    Start     Dose/Rate Route Frequency Ordered Stop   10/31/21 1300  doxycycline (VIBRA-TABS) tablet 100 mg  Status:  Discontinued        100 mg Oral Every 12 hours 10/31/21 1200 11/03/21 1458   10/31/21 1245  Ampicillin-Sulbactam (UNASYN) 3 g in sodium chloride 0.9 % 100 mL IVPB  Status:  Discontinued        3 g 200 mL/hr over 30 Minutes Intravenous Every 6 hours 10/31/21 1200 11/03/21 1458   10/30/21 1045  vancomycin (VANCOREADY) IVPB 1000 mg/200 mL  Status:  Discontinued        1,000 mg 200 mL/hr over 60 Minutes Intravenous Every 36 hours 10/30/21 0957 10/31/21 1200   10/30/21 1015  ceFEPIme (MAXIPIME) 2 g in sodium chloride 0.9 % 100 mL IVPB  Status:  Discontinued        2 g 200 mL/hr over 30 Minutes Intravenous Every 12 hours 10/30/21 1010 10/31/21 1200   10/29/21 2000  ceFEPIme (MAXIPIME) 2 g in sodium chloride 0.9 % 100 mL IVPB  Status:  Discontinued        2 g 200 mL/hr over 30 Minutes Intravenous Every 24 hours 10/28/21 2011 10/30/21 1010   10/28/21 2004  vancomycin variable dose per unstable renal function (pharmacist dosing)  Status:  Discontinued         Does not apply See admin instructions 10/28/21 2004 10/31/21 1200   10/28/21 1945  ceFEPIme (MAXIPIME) 2 g in sodium chloride 0.9 % 100 mL IVPB        2 g 200 mL/hr over 30 Minutes Intravenous  Once 10/28/21 1940 10/28/21 2028   10/28/21 1945  metroNIDAZOLE (FLAGYL) IVPB 500 mg        500 mg 100 mL/hr over 60 Minutes Intravenous  Once 10/28/21 1940 10/28/21 2051   10/28/21 1945  vancomycin (VANCOREADY) IVPB 1500 mg/300 mL        1,500 mg 150 mL/hr over 120 Minutes Intravenous  Once 10/28/21 1940 10/28/21 2213       Medications: Scheduled Meds:  allopurinol  150 mg Oral Daily   colchicine  0.6 mg Oral BID   diclofenac Sodium  2 g Topical QID   insulin aspart  0-5 Units Subcutaneous QHS   insulin aspart  0-9 Units  Subcutaneous TID WC   insulin glargine-yfgn  5 Units Subcutaneous Daily   pantoprazole (PROTONIX) IV  40 mg Intravenous Q12H   QUEtiapine  25 mg Oral QHS   Continuous Infusions:  sodium chloride 100 mL/hr at 11/05/21 0558   PRN Meds:.acetaminophen **OR** acetaminophen, ibuprofen, metoprolol tartrate, traZODone    Objective: Weight change:   Intake/Output Summary (Last 24 hours) at 11/05/2021 1537 Last data filed at 11/05/2021 1500 Gross per 24 hour  Intake 840.07 ml  Output 1200 ml  Net -359.93 ml    Blood pressure 118/66, pulse 74, temperature 98.4 F (36.9 C), temperature source Oral, resp. rate 17, height 5' 2"  (1.575 m), weight 63.6 kg, SpO2 95 %. Temp:  [98 F (36.7 C)-98.8 F (37.1 C)] 98.4 F (36.9 C) (11/28 1129) Pulse Rate:  [67-76] 74 (11/28 1200) Resp:  [13-18] 17 (11/28 1200) BP: (95-124)/(59-73) 118/66 (11/28 1200) SpO2:  [93 %-97 %] 95 % (11/28 1200)  Physical Exam: Physical Exam Vitals reviewed.  Constitutional:      General: He is not in acute distress.  Appearance: He is not ill-appearing.  HENT:     Head: Normocephalic and atraumatic.     Nose: Nose normal.  Eyes:     Extraocular Movements: Extraocular movements intact.     Conjunctiva/sclera: Conjunctivae normal.  Pulmonary:     Effort: No respiratory distress.     Breath sounds: No wheezing.  Abdominal:     General: There is no distension.     Palpations: There is no mass.     Tenderness: There is no abdominal tenderness.     Hernia: No hernia is present.  Skin:    Coloration: Skin is not jaundiced or pale.     Findings: No bruising, erythema, lesion or rash.  Neurological:     General: No focal deficit present.     Mental Status: He is alert.    He says that his pain in his knees has improved    CBC:    BMET Recent Labs    11/04/21 0748 11/05/21 0227  NA 137 138  K 3.8 3.7  CL 108 109  CO2 22 24  GLUCOSE 112* 94  BUN 15 13  CREATININE 0.89 0.96  CALCIUM 7.7* 7.8*       Liver Panel  Recent Labs    11/04/21 0748 11/05/21 0227  PROT  --  4.5*  ALBUMIN 1.7* 1.8*  AST  --  20  ALT  --  36  ALKPHOS  --  90  BILITOT  --  0.9        Sedimentation Rate Recent Labs    11/04/21 0050  ESRSEDRATE 85*    C-Reactive Protein Recent Labs    11/04/21 0050  CRP 5.8*     Micro Results: Recent Results (from the past 720 hour(s))  Blood Culture (routine x 2)     Status: None   Collection Time: 10/28/21  6:20 PM   Specimen: BLOOD LEFT HAND  Result Value Ref Range Status   Specimen Description BLOOD LEFT HAND  Final   Special Requests   Final    BOTTLES DRAWN AEROBIC ONLY Blood Culture adequate volume   Culture   Final    NO GROWTH 5 DAYS Performed at [facility], [address], [place], [place] [address]    Report Status 11/02/2021 FINAL  Final  Blood Culture (routine x 2)     Status: None   Collection Time: 10/28/21  6:46 PM   Specimen: BLOOD LEFT ARM  Result Value Ref Range Status   Specimen Description BLOOD LEFT ARM  Final   Special Requests   Final    BOTTLES DRAWN AEROBIC AND ANAEROBIC Blood Culture adequate volume   Culture   Final    NO GROWTH 5 DAYS Performed at [facility], [place] [address], [place],  [address]    Report Status 11/02/2021 FINAL  Final  Resp Panel by RT-PCR (Flu A&B, Covid) Nasopharyngeal Swab     Status: None   Collection Time: 10/28/21  6:48 PM   Specimen: Nasopharyngeal Swab; Nasopharyngeal(NP) swabs in vial transport medium  Result Value Ref Range Status   SARS Coronavirus 2 by RT PCR NEGATIVE NEGATIVE Final    Comment: (NOTE) SARS-CoV-2 target nucleic acids are NOT DETECTED.  The SARS-CoV-2 RNA is generally detectable in upper respiratory specimens during the acute phase of infection. The lowest concentration of SARS-CoV-2 viral copies this assay can detect is 138 copies/mL. A negative result does not preclude SARS-Cov-2 infection and should not be used as the  sole basis  for treatment or other patient management decisions. A negative result may occur with  improper specimen collection/handling, submission of specimen other than nasopharyngeal swab, presence of viral mutation(s) within the areas targeted by this assay, and inadequate number of viral copies(<138 copies/mL). A negative result must be combined with clinical observations, patient history, and epidemiological information. The expected result is Negative.  Fact Sheet for Patients:  EntrepreneurPulse.com.au  Fact Sheet for Healthcare Providers:  IncredibleEmployment.be  This test is no t yet approved or cleared by the [place] FDA and  has been authorized for detection and/or diagnosis of SARS-CoV-2 by FDA under an Emergency Use Authorization (EUA). This EUA will remain  in effect (meaning this test can be used) for the duration of the COVID-19 declaration under Section 564(b)(1) of the Act, 21 U.S.C.section 360bbb-3(b)(1), unless the authorization is terminated  or revoked sooner.       Influenza A by PCR NEGATIVE NEGATIVE Final   Influenza B by PCR NEGATIVE NEGATIVE Final    Comment: (NOTE) The Xpert Xpress SARS-CoV-2/FLU/RSV plus assay is intended as an aid in the diagnosis of influenza from Nasopharyngeal swab specimens and should not be used as a sole basis for treatment. Nasal washings and aspirates are unacceptable for Xpert Xpress SARS-CoV-2/FLU/RSV testing.  Fact Sheet for Patients: EntrepreneurPulse.com.au  Fact Sheet for Healthcare Providers: IncredibleEmployment.be  This test is not yet approved or cleared by the [place] FDA and has been authorized for detection and/or diagnosis of SARS-CoV-2 by FDA under an Emergency Use Authorization (EUA). This EUA will remain in effect (meaning this test can be used) for the duration of the COVID-19 declaration under Section 564(b)(1) of the Act, 21  U.S.C. section 360bbb-3(b)(1), unless the authorization is terminated or revoked.  Performed at [facility], [place] [address], [place], [place] [address]   Urine Culture     Status: None   Collection Time: 10/28/21  8:51 PM   Specimen: In/Out Cath Urine  Result Value Ref Range Status   Specimen Description IN/OUT CATH URINE  Final   Special Requests NONE  Final   Culture   Final    NO GROWTH Performed at [facility], [place] [address] [place], [place] [address]    Report Status 10/29/2021 FINAL  Final  MRSA Next Gen by PCR, Nasal     Status: None   Collection Time: 10/29/21  8:51 AM   Specimen: Nasal Mucosa; Nasal Swab  Result Value Ref Range Status   MRSA by PCR Next Gen NOT DETECTED NOT DETECTED Final    Comment: (NOTE) The GeneXpert MRSA Assay (FDA approved for NASAL specimens only), is one component of a comprehensive MRSA colonization surveillance program. It is not intended to diagnose MRSA infection nor to guide or monitor treatment for MRSA infections. Test performance is not FDA approved in patients less than 101 years old. Performed at [facility], [place] [address] [place], [place] [address]   Culture, blood (routine x 2)     Status: None (Preliminary result)   Collection Time: 11/03/21  7:58 AM   Specimen: BLOOD  Result Value Ref Range Status   Specimen Description BLOOD LEFT ANTECUBITAL  Final   Special Requests   Final    BOTTLES DRAWN AEROBIC ONLY Blood Culture results may not be optimal due to an inadequate volume of blood received in culture bottles   Culture   Final    NO GROWTH 2 DAYS Performed at [facility], [place] [address] [place], [place] [address]    Report Status PENDING  Incomplete  Culture, blood (routine x 2)     Status: None (Preliminary result)   Collection Time: 11/03/21  7:58 AM   Specimen: BLOOD LEFT HAND  Result Value Ref Range Status   Specimen Description BLOOD LEFT HAND  Final   Special Requests   Final     BOTTLES DRAWN AEROBIC AND ANAEROBIC Blood Culture results may not be optimal due to an inadequate volume of blood received in culture bottles   Culture   Final    NO GROWTH 2 DAYS Performed at [facility], [place] [address], [place], [place] [address]    Report Status PENDING  Incomplete    Studies/Results: CT CHEST W CONTRAST  Result Date: 11/04/2021 CLINICAL DATA:  Fever. EXAM: CT CHEST WITH CONTRAST TECHNIQUE: Multidetector CT imaging of the chest was performed during intravenous contrast administration. CONTRAST:  56m OMNIPAQUE IOHEXOL 300 MG/ML  SOLN COMPARISON:  October 31, 2021 FINDINGS: Cardiovascular: There is mild calcification of the aortic arch, without evidence of aortic aneurysm or dissection. Normal heart size with mild coronary artery calcification. No pericardial effusion. Mediastinum/Nodes: There is mild AP window, right paratracheal and right hilar lymphadenopathy. There is stable marked severity narrowing of the right mainstem bronchus (axial CT image 51, CT series 4). The thyroid gland and esophagus demonstrate no significant findings. Lungs/Pleura: Mild areas of atelectasis are seen within the inferior aspect of the left upper lobe and posterior aspects of the bilateral lung bases. This is mildly decreased in severity within the superior segment of the left upper lobe when compared to the prior study and stable in severity throughout the remaining portions of the bilateral lung bases and left upper lobe. A stable 7 mm left upper lobe noncalcified lung nodule is seen (axial CT image 91, CT series 4). Small, stable bilateral pleural effusions are noted. No pneumothorax is identified. Upper Abdomen: No acute abnormality. Musculoskeletal: No chest wall abnormality. No acute or significant osseous findings. IMPRESSION: 1. Mild areas of left upper lobe and bilateral lower lobe atelectasis and/or infiltrate, mildly decreased in severity within the left lower lobe since the prior  study. 2. Small, stable bilateral pleural effusions. 3. Stable 7 mm left upper lobe noncalcified lung nodule. Non-contrast chest CT at 6-12 months is recommended. If the nodule is stable at time of repeat CT, then future CT at 18-24 months (from today's scan) is considered optional for low-risk patients, but is recommended for high-risk patients. This recommendation follows the consensus statement: Guidelines for Management of Incidental Pulmonary Nodules Detected on CT Images: From the Fleischner Society 2017; Radiology 2017; 284:228-243. 4. Stable marked severity narrowing of the right mainstem bronchus. 5. Mild coronary artery disease. 6. Aortic atherosclerosis. Aortic Atherosclerosis (ICD10-I70.0). Electronically Signed   By: TVirgina NorfolkM.D.   On: 11/04/2021 20:23   MR KNEE LEFT W WO CONTRAST  Result Date: 11/05/2021 CLINICAL DATA:  Septic arthritis suspected, knee, xray done EXAM: MRI OF THE LEFT KNEE WITHOUT AND WITH CONTRAST TECHNIQUE: Multiplanar, multisequence MR imaging of the left knee was performed both before and after administration of intravenous contrast. CONTRAST:  6.357mGADAVIST GADOBUTROL 1 MMOL/ML IV SOLN COMPARISON:  None. FINDINGS: MENISCI Medial: Nondisplaced degenerative tearing of the posterior horn and body of the medial meniscus. Lateral: Intrasubstance degenerative signal without definitive tear. LIGAMENTS Cruciates: ACL and PCL are intact. Collaterals: Medial collateral ligament is intact. Lateral collateral ligament complex is intact. CARTILAGE  Patellofemoral: Mild chondrosis. There is a 2 mm intermediate grade focal defect along the medial trochlea (series 3, image 14). Medial: Overall moderate chondrosis with intermediate grade chondral fissuring along the weight-bearing medial femoral condyle and partial-thickness cartilage loss along the tibial plateau. Lateral:  Mild chondrosis.  No focal defect. JOINT: Small joint effusion with appropriately thin synovial enhancement.  POPLITEAL FOSSA: No significant Baker cyst. EXTENSOR MECHANISM: There is intermediate grade, partial width tearing of the central and lateral fibers of the distal quadriceps tendon at its insertion on the patella, and with reactive adjacent edema/cystic change in the patella. The patella tendon appears mildly lax with insertional patellar tendinosis at the patella. BONES: There is no acute fracture or dislocation. There is mild bony edema signal within the fibular head. The cortex is intact. There is no aggressive osseous lesion. Other: There is intramuscular edema signal within the distal vastus medialis and lateralis muscles which is likely reactive in association with the quadriceps tendon tearing. There is also intramuscular edema signal within the proximal aspect of the anterior compartment muscles in the lower leg, which is likely reactive as well. No focal fluid collection. No abnormal enhancement. IMPRESSION: Tricompartment osteoarthritis, worst in the medial compartment with nondisplaced degenerative tearing of the posterior horn and body of the medial meniscus. Moderate medial compartment chondrosis with intermediate grade chondral fissuring and partial-thickness cartilage loss along the weight-bearing surfaces. Mild patellofemoral chondrosis with 2 mm focal chondral defect along the medial trochlea. Intermediate grade, partial width tearing of the central and lateral fibers of the distal quadriceps tendon at its insertion on the patella. Reactive intramuscular edema within the distal vastus medialis and lateralis muscles as well as the proximal anterior compartment muscles in the lower leg. Moderate proximal insertional patellar tendinosis. Small joint effusion, which is likely reactive to the above findings. No convincing findings to suggest septic arthritis or osteomyelitis. Electronically Signed   By: Maurine Simmering M.D.   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 male Spanish-speaking man with history of stroke and left bundle branch block diabetes mellitus chronic kidney disease who had presented with confusion and labored breathing.  He had been found to be profoundly anemic and the need of blood transfusion.  He also had acute kidney injury.  He also had apparent acute onset thrombocytopenia with platelets being 101 on November 20 versus 404 in January 2021 which of the last lab we have in our system.  His initial CBC had shown also 600 immature granulocytes with peripheral smear showing marked left shift with metal sites myelocytes and occasional blasts seen  Patient had imaging in the ER and blood cultures were taken CT of  the abdomen pelvis and CT chest were performed with CT chest and CT abdomen pelvis is suggested infiltrate in the left upper lobe and left lower lobe.  Patient had been on very broad-spectrum antibiotics in the form of vancomycin metronidazole and cefepime followed by vancomycin and cefepime then Unasyn and doxycycline.  I believe now the doxycycline is likely added to cover potential [facility] spotted fever, her lochia.  Despite being on antibiotics he then became febrile to 102.3 degrees on November 02, 2021.  When I saw him initially he was fairly confused and there is a question of whether he might have some underlying cognitive deficit.  The main complaints that he voiced to me and that the family also mentioned were his bilateral knee calf and ankle pain.  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  However gout would certainly not explain his worsening thrombocytopenia and now leukopenia over the last several days.  I am concerned that we are missing something as far as the cause for all of his constellation of symptoms.  A tickborne infection with Ehrlichia or [place] spotted fever would make some sense to me but his failure to show much improvement with doxycycline would argue against this.   I ordered an MRI yesterday which I reviewed and which does not show evidence of any infectious pathology in his left knee but significant osteoarthritis and a trace effusion  CT of the chest shows stable bilateral pleural effusions as well as areas of atelectasis infiltrate but of decreased in severity since prior study  I really think he needs a bone marrow biopsy at this point with [facility] sent for pathology as well as AFB and fungal cultures.  Apparently he did have thrombocytopenia and leukopenia at [facility] in October 2022 but the last time he had labs in 2021 he actually had thrombophilia and a normal white count.  Recently that visit on September 23, 2021 was for constellation of symptoms of subjective fever abdominal pain nausea emesis diarrhea cough chest pain and myalgias.  I continue to suspect he has a more subacute process such as potentially a malignancy or disseminated tuberculosis or fungal infection rather than typical bacterial infection he has had abnormal cell lines now more than a month but prior that did not as far as I can tell.  I am ordering a QF gold test.  Serum cryptococcal antigen urine histoplasma antigen  urine Blastomyces antigen and serum Coccidioides antibody  I ordered IR guided bone marrow biopsy but they cannot do this until Thursday  Hematology/Oncology  have also been consulted and greatly appreciate their help.  I spent 47mnutes with the patient including face to face counseling of the patient his son via telephonic iPad [place], personally reviewing MRI of his knee CT of his chest with contrast, CBC BMP outside records along  review of medical records before and during the visit and in coordination of his care.     LOS: 8 days   CAlcide Evener11/28/2022, 3:37 PM

## 2021-11-06 DIAGNOSIS — N179 Acute kidney failure, unspecified: Secondary | ICD-10-CM | POA: Diagnosis not present

## 2021-11-06 DIAGNOSIS — D62 Acute posthemorrhagic anemia: Secondary | ICD-10-CM | POA: Diagnosis not present

## 2021-11-06 DIAGNOSIS — A419 Sepsis, unspecified organism: Secondary | ICD-10-CM | POA: Diagnosis not present

## 2021-11-06 DIAGNOSIS — G9341 Metabolic encephalopathy: Secondary | ICD-10-CM | POA: Diagnosis not present

## 2021-11-06 LAB — CBC
HCT: 23.2 % — ABNORMAL LOW (ref 39.0–52.0)
Hemoglobin: 7.9 g/dL — ABNORMAL LOW (ref 13.0–17.0)
MCH: 31.2 pg (ref 26.0–34.0)
MCHC: 34.1 g/dL (ref 30.0–36.0)
MCV: 91.7 fL (ref 80.0–100.0)
Platelets: 66 10*3/uL — ABNORMAL LOW (ref 150–400)
RBC: 2.53 MIL/uL — ABNORMAL LOW (ref 4.22–5.81)
RDW: 14.9 % (ref 11.5–15.5)
WBC: 2.5 10*3/uL — ABNORMAL LOW (ref 4.0–10.5)
nRBC: 0 % (ref 0.0–0.2)

## 2021-11-06 LAB — RENAL FUNCTION PANEL
Albumin: 1.9 g/dL — ABNORMAL LOW (ref 3.5–5.0)
Anion gap: 5 (ref 5–15)
BUN: 9 mg/dL (ref 8–23)
CO2: 24 mmol/L (ref 22–32)
Calcium: 7.9 mg/dL — ABNORMAL LOW (ref 8.9–10.3)
Chloride: 109 mmol/L (ref 98–111)
Creatinine, Ser: 0.87 mg/dL (ref 0.61–1.24)
GFR, Estimated: >60 mL/min (ref >60)
Glucose, Bld: 105 mg/dL — ABNORMAL HIGH (ref 70–99)
Phosphorus: 2.8 mg/dL (ref 2.5–4.6)
Potassium: 3.8 mmol/L (ref 3.5–5.1)
Sodium: 138 mmol/L (ref 135–145)

## 2021-11-06 LAB — CRYPTOCOCCAL ANTIGEN: Crypto Ag: NEGATIVE

## 2021-11-06 LAB — MAGNESIUM: Magnesium: 1.4 mg/dL — ABNORMAL LOW (ref 1.7–2.4)

## 2021-11-06 LAB — GLUCOSE, CAPILLARY
Glucose-Capillary: 89 mg/dL (ref 70–99)
Glucose-Capillary: 122 mg/dL — ABNORMAL HIGH (ref 70–99)
Glucose-Capillary: 121 mg/dL — ABNORMAL HIGH (ref 70–99)
Glucose-Capillary: 135 mg/dL — ABNORMAL HIGH (ref 70–99)

## 2021-11-06 LAB — LACTATE DEHYDROGENASE: LDH: 316 U/L — ABNORMAL HIGH (ref 98–192)

## 2021-11-06 MED ORDER — MAGNESIUM SULFATE 4 GM/100ML IV SOLN
4.0000 g | Freq: Once | INTRAVENOUS | Status: AC
  Administered 2021-11-06: 4 g via INTRAVENOUS
  Filled 2021-11-06: qty 100

## 2021-11-06 NOTE — Progress Notes (Signed)
No change in assessment.

## 2021-11-06 NOTE — Progress Notes (Signed)
Subjective:  No complaints  Antibiotics:  Anti-infectives (From admission, onward)    Start     Dose/Rate Route Frequency Ordered Stop   10/31/21 1300  doxycycline (VIBRA-TABS) tablet 100 mg  Status:  Discontinued        100 mg Oral Every 12 hours 10/31/21 1200 11/03/21 1458   10/31/21 1245  Ampicillin-Sulbactam (UNASYN) 3 g in sodium chloride 0.9 % 100 mL IVPB  Status:  Discontinued        3 g 200 mL/hr over 30 Minutes Intravenous Every 6 hours 10/31/21 1200 11/03/21 1458   10/30/21 1045  vancomycin (VANCOREADY) IVPB 1000 mg/200 mL  Status:  Discontinued        1,000 mg 200 mL/hr over 60 Minutes Intravenous Every 36 hours 10/30/21 0957 10/31/21 1200   10/30/21 1015  ceFEPIme (MAXIPIME) 2 g in sodium chloride 0.9 % 100 mL IVPB  Status:  Discontinued        2 g 200 mL/hr over 30 Minutes Intravenous Every 12 hours 10/30/21 1010 10/31/21 1200   10/29/21 2000  ceFEPIme (MAXIPIME) 2 g in sodium chloride 0.9 % 100 mL IVPB  Status:  Discontinued        2 g 200 mL/hr over 30 Minutes Intravenous Every 24 hours 10/28/21 2011 10/30/21 1010   10/28/21 2004  vancomycin variable dose per unstable renal function (pharmacist dosing)  Status:  Discontinued         Does not apply See admin instructions 10/28/21 2004 10/31/21 1200   10/28/21 1945  ceFEPIme (MAXIPIME) 2 g in sodium chloride 0.9 % 100 mL IVPB        2 g 200 mL/hr over 30 Minutes Intravenous  Once 10/28/21 1940 10/28/21 2028   10/28/21 1945  metroNIDAZOLE (FLAGYL) IVPB 500 mg        500 mg 100 mL/hr over 60 Minutes Intravenous  Once 10/28/21 1940 10/28/21 2051   10/28/21 1945  vancomycin (VANCOREADY) IVPB 1500 mg/300 mL        1,500 mg 150 mL/hr over 120 Minutes Intravenous  Once 10/28/21 1940 10/28/21 2213       Medications: Scheduled Meds:  allopurinol  150 mg Oral Daily   colchicine  0.6 mg Oral BID   diclofenac Sodium  2 g Topical QID   insulin aspart  0-5 Units Subcutaneous QHS   insulin aspart  0-9 Units  Subcutaneous TID WC   insulin glargine-yfgn  5 Units Subcutaneous Daily   pantoprazole (PROTONIX) IV  40 mg Intravenous Q12H   QUEtiapine  25 mg Oral QHS   Continuous Infusions:  sodium chloride 100 mL/hr at 11/06/21 0318   PRN Meds:.acetaminophen **OR** acetaminophen, ibuprofen, metoprolol tartrate, traZODone    Objective: Weight change:   Intake/Output Summary (Last 24 hours) at 11/06/2021 1524 Last data filed at 11/06/2021 3734 Gross per 24 hour  Intake --  Output 550 ml  Net -550 ml    Blood pressure 93/68, pulse 77, temperature 98.1 F (36.7 C), temperature source Oral, resp. rate 17, height 5' 2"  (1.575 m), weight 63.6 kg, SpO2 96 %. Temp:  [98 F (36.7 C)-98.9 F (37.2 C)] 98.1 F (36.7 C) (11/29 1137) Pulse Rate:  [66-146] 77 (11/29 1137) Resp:  [11-20] 17 (11/29 1137) BP: (93-137)/(54-75) 93/68 (11/29 1137) SpO2:  [96 %-100 %] 96 % (11/29 1137)  Physical Exam: Physical Exam Constitutional:      Appearance: He is well-developed.  HENT:     Head: Normocephalic and atraumatic.  Eyes:     Conjunctiva/sclera: Conjunctivae normal.  Cardiovascular:     Rate and Rhythm: Normal rate and regular rhythm.     Heart sounds: No murmur heard.   No friction rub. No gallop.  Pulmonary:     Effort: Pulmonary effort is normal. No respiratory distress.     Breath sounds: Normal breath sounds. No stridor. No wheezing.  Abdominal:     General: There is no distension.     Palpations: Abdomen is soft.  Musculoskeletal:        General: Normal range of motion.     Cervical back: Normal range of motion and neck supple.  Skin:    General: Skin is warm and dry.     Findings: No erythema or rash.  Neurological:     General: No focal deficit present.     Mental Status: He is alert.  Psychiatric:        Mood and Affect: Mood normal.        Behavior: Behavior is slowed.        Thought Content: Thought content normal.        Cognition and Memory: Memory is impaired. He  exhibits impaired recent memory.        Judgment: Judgment normal.    He says that his pain in his knees has improved    CBC:    BMET Recent Labs    11/05/21 0227 11/06/21 0400  NA 138 138  K 3.7 3.8  CL 109 109  CO2 24 24  GLUCOSE 94 105*  BUN 13 9  CREATININE 0.96 0.87  CALCIUM 7.8* 7.9*      Liver Panel  Recent Labs    11/05/21 0227 11/06/21 0400  PROT 4.5*  --   ALBUMIN 1.8* 1.9*  AST 20  --   ALT 36  --   ALKPHOS 90  --   BILITOT 0.9  --         Sedimentation Rate Recent Labs    11/04/21 0050  ESRSEDRATE 85*    C-Reactive Protein Recent Labs    11/04/21 0050  CRP 5.8*     Micro Results: Recent Results (from the past 720 hour(s))  Blood Culture (routine x 2)     Status: None   Collection Time: 10/28/21  6:20 PM   Specimen: BLOOD LEFT HAND  Result Value Ref Range Status   Specimen Description BLOOD LEFT HAND  Final   Special Requests   Final    BOTTLES DRAWN AEROBIC ONLY Blood Culture adequate volume   Culture   Final    NO GROWTH 5 DAYS Performed at [facility], [address], [place], [place] [address]    Report Status 11/02/2021 FINAL  Final  Blood Culture (routine x 2)     Status: None   Collection Time: 10/28/21  6:46 PM   Specimen: BLOOD LEFT ARM  Result Value Ref Range Status   Specimen Description BLOOD LEFT ARM  Final   Special Requests   Final    BOTTLES DRAWN AEROBIC AND ANAEROBIC Blood Culture adequate volume   Culture   Final    NO GROWTH 5 DAYS Performed at [facility], [place] [address] [place], [place] [address]    Report Status 11/02/2021 FINAL  Final  Resp Panel by RT-PCR (Flu A&B, Covid) Nasopharyngeal Swab     Status: None   Collection Time: 10/28/21  6:48 PM   Specimen: Nasopharyngeal Swab; Nasopharyngeal(NP) swabs in vial transport  medium  Result Value Ref Range Status   SARS Coronavirus 2 by RT PCR NEGATIVE NEGATIVE Final    Comment: (NOTE) SARS-CoV-2 target nucleic acids are NOT  DETECTED.  The SARS-CoV-2 RNA is generally detectable in upper respiratory specimens during the acute phase of infection. The lowest concentration of SARS-CoV-2 viral copies this assay can detect is 138 copies/mL. A negative result does not preclude SARS-Cov-2 infection and should not be used as the sole basis for treatment or other patient management decisions. A negative result may occur with  improper specimen collection/handling, submission of specimen other than nasopharyngeal swab, presence of viral mutation(s) within the areas targeted by this assay, and inadequate number of viral copies(<138 copies/mL). A negative result must be combined with clinical observations, patient history, and epidemiological information. The expected result is Negative.  Fact Sheet for Patients:  EntrepreneurPulse.com.au  Fact Sheet for Healthcare Providers:  IncredibleEmployment.be  This test is no t yet approved or cleared by the [place] FDA and  has been authorized for detection and/or diagnosis of SARS-CoV-2 by FDA under an Emergency Use Authorization (EUA). This EUA will remain  in effect (meaning this test can be used) for the duration of the COVID-19 declaration under Section 564(b)(1) of the Act, 21 U.S.C.section 360bbb-3(b)(1), unless the authorization is terminated  or revoked sooner.       Influenza A by PCR NEGATIVE NEGATIVE Final   Influenza B by PCR NEGATIVE NEGATIVE Final    Comment: (NOTE) The Xpert Xpress SARS-CoV-2/FLU/RSV plus assay is intended as an aid in the diagnosis of influenza from Nasopharyngeal swab specimens and should not be used as a sole basis for treatment. Nasal washings and aspirates are unacceptable for Xpert Xpress SARS-CoV-2/FLU/RSV testing.  Fact Sheet for Patients: EntrepreneurPulse.com.au  Fact Sheet for Healthcare Providers: IncredibleEmployment.be  This test is not yet  approved or cleared by the [place] FDA and has been authorized for detection and/or diagnosis of SARS-CoV-2 by FDA under an Emergency Use Authorization (EUA). This EUA will remain in effect (meaning this test can be used) for the duration of the COVID-19 declaration under Section 564(b)(1) of the Act, 21 U.S.C. section 360bbb-3(b)(1), unless the authorization is terminated or revoked.  Performed at [facility], [place] [address] [place], [place] [address]   Urine Culture     Status: None   Collection Time: 10/28/21  8:51 PM   Specimen: In/Out Cath Urine  Result Value Ref Range Status   Specimen Description IN/OUT CATH URINE  Final   Special Requests NONE  Final   Culture   Final    NO GROWTH Performed at [facility], [place] [address], [place], [place] [address]    Report Status 10/29/2021 FINAL  Final  MRSA Next Gen by PCR, Nasal     Status: None   Collection Time: 10/29/21  8:51 AM   Specimen: Nasal Mucosa; Nasal Swab  Result Value Ref Range Status   MRSA by PCR Next Gen NOT DETECTED NOT DETECTED Final    Comment: (NOTE) The GeneXpert MRSA Assay (FDA approved for NASAL specimens only), is one component of a comprehensive MRSA colonization surveillance program. It is not intended to diagnose MRSA infection nor to guide or monitor treatment for MRSA infections. Test performance is not FDA approved in patients less than 55 years old. Performed at [facility], [place] [address], [place], [place] [address]   Culture, blood (routine x 2)     Status: None (Preliminary result)  Collection Time: 11/03/21  7:58 AM   Specimen: BLOOD  Result Value Ref Range Status   Specimen Description BLOOD LEFT ANTECUBITAL  Final   Special Requests   Final    BOTTLES DRAWN AEROBIC ONLY Blood Culture results may not be optimal due to an inadequate volume of blood received in culture bottles   Culture   Final    NO GROWTH 3 DAYS Performed at [facility], [place] [address] [place], [place] [address]    Report Status PENDING  Incomplete  Culture, blood (routine x 2)     Status: None (Preliminary result)   Collection Time: 11/03/21  7:58 AM   Specimen: BLOOD LEFT HAND  Result Value Ref Range Status   Specimen Description BLOOD LEFT HAND  Final   Special Requests   Final    BOTTLES DRAWN AEROBIC AND ANAEROBIC Blood Culture results may not be optimal due to an inadequate volume of blood received in culture bottles   Culture   Final    NO GROWTH 3 DAYS Performed at [facility], [place] [address], [place], [place] [address]    Report Status PENDING  Incomplete    Studies/Results: CT CHEST W CONTRAST  Result Date: 11/04/2021 CLINICAL DATA:  Fever. EXAM: CT CHEST WITH CONTRAST TECHNIQUE: Multidetector CT imaging of the chest was performed during intravenous contrast administration. CONTRAST:  61m OMNIPAQUE IOHEXOL 300 MG/ML  SOLN COMPARISON:  October 31, 2021 FINDINGS: Cardiovascular: There is mild calcification of the aortic arch, without evidence of aortic aneurysm or dissection. Normal heart size with mild coronary artery calcification. No pericardial effusion. Mediastinum/Nodes: There is mild AP window, right paratracheal and right hilar lymphadenopathy. There is stable marked severity narrowing of the right mainstem bronchus (axial CT image 51, CT series 4). The thyroid gland and esophagus demonstrate no significant findings. Lungs/Pleura: Mild areas of atelectasis are seen within the inferior aspect of the left upper lobe and posterior aspects of the bilateral lung bases. This is mildly decreased in severity within the superior segment of the left upper lobe when compared to the prior study and stable in severity throughout the remaining portions of the bilateral lung bases and left upper lobe. A stable 7 mm left upper lobe noncalcified lung nodule is seen (axial CT image 91, CT series 4). Small, stable bilateral pleural effusions are noted. No pneumothorax is  identified. Upper Abdomen: No acute abnormality. Musculoskeletal: No chest wall abnormality. No acute or significant osseous findings. IMPRESSION: 1. Mild areas of left upper lobe and bilateral lower lobe atelectasis and/or infiltrate, mildly decreased in severity within the left lower lobe since the prior study. 2. Small, stable bilateral pleural effusions. 3. Stable 7 mm left upper lobe noncalcified lung nodule. Non-contrast chest CT at 6-12 months is recommended. If the nodule is stable at time of repeat CT, then future CT at 18-24 months (from today's scan) is considered optional for low-risk patients, but is recommended for high-risk patients. This recommendation follows the consensus statement: Guidelines for Management of Incidental Pulmonary Nodules Detected on CT Images: From the Fleischner Society 2017; Radiology 2017; 284:228-243. 4. Stable marked severity narrowing of the right mainstem bronchus. 5. Mild coronary artery disease. 6. Aortic atherosclerosis. Aortic Atherosclerosis (ICD10-I70.0). Electronically Signed   By: TVirgina NorfolkM.D.   On: 11/04/2021 20:23   MR KNEE LEFT W WO CONTRAST  Result Date: 11/05/2021 CLINICAL DATA:  Septic arthritis suspected, knee, xray done EXAM: MRI OF THE LEFT KNEE WITHOUT AND WITH CONTRAST  TECHNIQUE: Multiplanar, multisequence MR imaging of the left knee was performed both before and after administration of intravenous contrast. CONTRAST:  6.47m GADAVIST GADOBUTROL 1 MMOL/ML IV SOLN COMPARISON:  None. FINDINGS: MENISCI Medial: Nondisplaced degenerative tearing of the posterior horn and body of the medial meniscus. Lateral: Intrasubstance degenerative signal without definitive tear. LIGAMENTS Cruciates: ACL and PCL are intact. Collaterals: Medial collateral ligament is intact. Lateral collateral ligament complex is intact. CARTILAGE Patellofemoral: Mild chondrosis. There is a 2 mm intermediate grade focal defect along the medial trochlea (series 3, image 14).  Medial: Overall moderate chondrosis with intermediate grade chondral fissuring along the weight-bearing medial femoral condyle and partial-thickness cartilage loss along the tibial plateau. Lateral:  Mild chondrosis.  No focal defect. JOINT: Small joint effusion with appropriately thin synovial enhancement. POPLITEAL FOSSA: No significant Baker cyst. EXTENSOR MECHANISM: There is intermediate grade, partial width tearing of the central and lateral fibers of the distal quadriceps tendon at its insertion on the patella, and with reactive adjacent edema/cystic change in the patella. The patella tendon appears mildly lax with insertional patellar tendinosis at the patella. BONES: There is no acute fracture or dislocation. There is mild bony edema signal within the fibular head. The cortex is intact. There is no aggressive osseous lesion. Other: There is intramuscular edema signal within the distal vastus medialis and lateralis muscles which is likely reactive in association with the quadriceps tendon tearing. There is also intramuscular edema signal within the proximal aspect of the anterior compartment muscles in the lower leg, which is likely reactive as well. No focal fluid collection. No abnormal enhancement. IMPRESSION: Tricompartment osteoarthritis, worst in the medial compartment with nondisplaced degenerative tearing of the posterior horn and body of the medial meniscus. Moderate medial compartment chondrosis with intermediate grade chondral fissuring and partial-thickness cartilage loss along the weight-bearing surfaces. Mild patellofemoral chondrosis with 2 mm focal chondral defect along the medial trochlea. Intermediate grade, partial width tearing of the central and lateral fibers of the distal quadriceps tendon at its insertion on the patella. Reactive intramuscular edema within the distal vastus medialis and lateralis muscles as well as the proximal anterior compartment muscles in the lower leg. Moderate  proximal insertional patellar tendinosis. Small joint effusion, which is likely reactive to the above findings. No convincing findings to suggest septic arthritis or osteomyelitis. Electronically Signed   By: JMaurine SimmeringM.D.   On: 11/05/2021 08:24      Assessment/Plan:  INTERVAL HISTORY:  Patient seen by hematology oncology.    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 male Spanish-speaking man with history of stroke and left bundle branch block diabetes mellitus chronic kidney disease who had presented with confusion and labored breathing.  He had been found to be profoundly anemic and the need of blood transfusion.  He also had  acute kidney injury.  He also had apparent acute onset thrombocytopenia with platelets being 101 on November 20 versus 404 in January 2021 which of the last lab we have in our system.  His initial CBC had shown also 600 immature granulocytes with peripheral smear showing marked left shift with metal sites myelocytes and occasional blasts seen  Patient had imaging in the ER and blood cultures were taken CT of the abdomen pelvis and CT chest were performed with CT chest and CT abdomen pelvis is suggested infiltrate in the left upper lobe and left lower lobe.  Patient had been on very broad-spectrum antibiotics in the form of vancomycin  metronidazole and cefepime followed by vancomycin and cefepime then Unasyn and doxycycline.  I believed now the doxycycline was  likely added to cover potential [facility] spotted fever, Ehrlichia  Despite being on antibiotics he then became febrile to 102.3 degrees on November 02, 2021.  When I saw him initially he was fairly confused and there is a question of whether he might have some underlying cognitive deficit.  The main complaints that he voiced to me and that the family also mentioned were his bilateral knee calf and ankle pain.  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  However gout would certainly not explain his worsening thrombocytopenia and now leukopenia over the last several days.  I am concerned that we are missing something as far as the cause for all of his constellation of symptoms.  A tickborne infection with Ehrlichia or [place] spotted fever would make some sense to me but his failure to show much improvement with doxycycline would argue against this.   I ordered an MRI yesterday which I reviewed and which does not show evidence of any infectious pathology in his left knee but significant osteoarthritis and a trace effusion  CT of the chest shows stable bilateral pleural effusions as well as areas of atelectasis infiltrate but of decreased in severity since prior study  I really think he needs a bone marrow biopsy at this point with specimen sent for pathology as well as AFB and fungal cultures.  Apparently he did have thrombocytopenia and leukopenia at [facility] in October 2022 but the last time he had labs in 2021 he actually had thrombophilia and a normal white count.  Recently that visit on September 23, 2021 was for constellation of symptoms of subjective fever abdominal pain nausea emesis diarrhea cough chest pain and myalgias.  I continue to suspect he has a more subacute process such as potentially  a malignancy or disseminated tuberculosis or fungal infection rather than typical bacterial infection he has had abnormal cell lines now more than a month but prior that did not as far as I can tell.  His son also told me today that the patient had become ill while visiting [place] several months ago and has never returned to normal.  HIV fourth-generation assay was negative.  RPR was negative  Have ordered a QuantiFERON gold test.  Serum cryptococcal antigen urine histoplasma antigen urine Blastomyces antigen and serum Coccidioides antibody  Hematology Oncology have also seen the patient in consultation and ordered multiple myeloma panel.  LDH remains elevated  We will also order EBV and CMV serologies  I ordered IR guided bone marrow biopsy but they cannot do this until Thursday   I spent 37 minutes with the patient including face to face counseling of the patient  and the patient's son with in person Spanish translation, reviewing updated serologies, review of medical records before and during the visit and in coordination of his care.     LOS: 9 days   Alcide Evener 11/06/2021, 3:24 PM

## 2021-11-06 NOTE — Progress Notes (Signed)
Occupational Therapy Treatment Patient Details Name: Eugene Cooper MRN: [id number] DOB: 1941-06-20 Today's Date: 11/06/2021   History of present illness 80 yo admitted 11/20 with AMS, weakness, tachycardia, hyperkalemia and symptomatic anemia. EGD 11/21. PMhx: DM, CVA, HTN, HLD, LBBB, gout   OT comments  OT treatment session with focus on functional transfers and family education on safely assisting patient with ADLs, acquisition and use of DME and therapeutic exercise while seated upright in recliner. Son present at bedside throughout session and in-person interpreter present in room to assist with interpretation. Patient progressed from supine to EOB with Min A and from EOB to recliner with Max A to stand, cues for hand placement and Min A for stand-pivot to recliner with use of RW. Education provided on the benefits of use of BSC. Son expressed desire to have one at time of d/c. Son reports desire for patient to return home at d/c with [facility] therapies. D/c recommendation updated. OT will continue to follow acutely to maximize safety/independence with self-care tasks and provide family education in prep for safe d/c home.    Recommendations for follow up therapy are one component of a multi-disciplinary discharge planning process, led by the attending physician.  Recommendations may be updated based on patient status, additional functional criteria and insurance authorization.    Follow Up Recommendations  Other (comment) (Family declining SNF rehab. Plan for patient to d/c home with family.)    Assistance Recommended at Discharge Frequent or constant Supervision/Assistance  Equipment Recommendations  BSC/3in1    Recommendations for Other Services      Precautions / Restrictions Precautions Precautions: Fall Precaution Comments: bil knee pain, gout Restrictions Weight Bearing Restrictions: No       Mobility Bed Mobility Overal bed mobility: Needs Assistance Bed Mobility: Supine  to Sit;Rolling Rolling: Modified independent (Device/Increase time) (with rail)   Supine to sit: Min assist     General bed mobility comments: Assist to elevate trunk. HOB elevated. Able to advance BLE from bed surface to EOB without external assist.    Transfers Overall transfer level: Needs assistance Equipment used: Rolling walker (2 wheels) Transfers: Sit to/from Stand Sit to Stand: Max assist           General transfer comment: Max A for sit to stand from slightly elevated EOB.     Balance Overall balance assessment: Needs assistance;History of Falls Sitting-balance support: Feet supported Sitting balance-Leahy Scale: Fair     Standing balance support: Bilateral upper extremity supported;Reliant on assistive device for balance Standing balance-Leahy Scale: Poor                             ADL either performed or assessed with clinical judgement   ADL Overall ADL's : Needs assistance/impaired     Grooming: Minimal assistance;Standing               Lower Body Dressing: Maximal assistance;Sit to/from stand   Toilet Transfer: Maximal assistance;Stand-pivot;Rolling walker (2 wheels) Toilet Transfer Details (indicate cue type and reason): Simulated with stand-pivot to recliner.                Extremity/Trunk Assessment              Vision       Perception     Praxis      Cognition Arousal/Alertness: Awake/alert Behavior During Therapy: Flat affect Overall Cognitive Status: Impaired/Different from baseline Area of Impairment: Memory;Following commands;Safety/judgement;Attention;Orientation  Orientation Level: Disoriented to;Time;Situation;Person   Memory: Decreased short-term memory Following Commands: Follows one step commands with increased time Safety/Judgement: Decreased awareness of safety;Decreased awareness of deficits   Problem Solving: Slow processing;Decreased initiation;Requires verbal  cues;Requires tactile cues;Difficulty sequencing General Comments: Incorrectly reports year of birth but able to recall month and day. Disoriented to time and situation. Follows 1-step verbal commands with increased accuracy this date. Son reports patient is closer to baseline cognition.          Exercises     Shoulder Instructions       General Comments Treatment session completed with in-person hospital interpreter present in room.    Pertinent Vitals/ Pain       Pain Assessment: No/denies pain Pain Intervention(s): Monitored during session  Home Living                                          Prior Functioning/Environment              Frequency  Min 2X/week        Progress Toward Goals  OT Goals(current goals can now be found in the care plan section)  Progress towards OT goals: Progressing toward goals  Acute Rehab OT Goals Patient Stated Goal: Per son; for patient to return home at time of d/c. OT Goal Formulation: With family Time For Goal Achievement: 11/13/21 Potential to Achieve Goals: Fair ADL Goals Pt Will Perform Eating: with set-up;sitting Pt Will Perform Grooming: with set-up;sitting Pt Will Perform Upper Body Bathing: with supervision;sitting Pt Will Perform Upper Body Dressing: with set-up;sitting Pt Will Transfer to Toilet: with min assist;stand pivot transfer;bedside commode Pt Will Perform Toileting - Clothing Manipulation and hygiene: with min assist;sit to/from stand Pt/caregiver will Perform Home Exercise Program: Increased strength;Both right and left upper extremity;With Supervision Additional ADL Goal #1: Patient will score <4/28 on SBT indicating improved cognition in prep for ADLs.  Plan Discharge plan needs to be updated;Frequency remains appropriate    Co-evaluation                 AM-PAC OT "6 Clicks" Daily Activity     Outcome Measure   Help from another person eating meals?: A Little Help from  another person taking care of personal grooming?: A Little Help from another person toileting, which includes using toliet, bedpan, or urinal?: A Lot Help from another person bathing (including washing, rinsing, drying)?: A Lot Help from another person to put on and taking off regular upper body clothing?: A Little Help from another person to put on and taking off regular lower body clothing?: A Lot 6 Click Score: 15    End of Session Equipment Utilized During Treatment: Gait belt;Rolling walker (2 wheels)  OT Visit Diagnosis: Unsteadiness on feet (R26.81);Other abnormalities of gait and mobility (R26.89);Muscle weakness (generalized) (M62.81);Pain   Activity Tolerance Patient tolerated treatment well   Patient Left in chair;with call bell/phone within reach;with chair alarm set   Nurse Communication Mobility status        Time: 4920-1007 OT Time Calculation (min): 31 min  Charges: OT General Charges $OT Visit: 1 Visit OT Treatments $Therapeutic Activity: 23-37 mins  Eugene Cooper H. OTR/L Supplemental OT, Department of rehab services [phone]  Eugene Salazar R H. 11/06/2021, 11:12 AM

## 2021-11-06 NOTE — TOC Progression Note (Addendum)
Transition of Care [facility]) - Progression Note    Patient Details  Name: Eugene Cooper MRN: [id number] Date of Birth: Apr 26, 1941  [facility] CM/SW Contact  Carles Collet, RN Phone Number: 11/06/2021, 12:01 PM  Clinical Narrative:    Damaris Schooner w patient's son at bedside, with whom he lives. He declined interpreter, states that plan for DC will be for patient to return to home with home health services.  HH accepted by [facility] per previous TOC documentation. Anticipate Bone Marrow Bx on Thursday.     Expected Discharge Plan: [place] Barriers to Discharge: Continued Medical Work up  Expected Discharge Plan and Services Expected Discharge Plan: [place]   Discharge Planning Services: CM Consult Post Acute Care Choice: [place] arrangements for the past 2 months: Single Family Home                           HH Arranged: RN, PT, OT [facility] Agency: [place] Date [place]: 11/02/21 Time [place]: [id number] Representative spoke with at [place]: [place] ([place]) Interventions    Readmission Risk Interventions No flowsheet data found.

## 2021-11-06 NOTE — Progress Notes (Signed)
PROGRESS NOTE  Eugene Cooper RRN:[id number] DOB: 12-Jun-1941   PCP: Emelia Loron, NP  Patient is from: Home.  Lives with family.  Uses rolling walker at baseline.  DOA: 10/28/2021 LOS: 9  Chief complaints:  Chief Complaint  Patient presents with   Altered Mental Status     Brief Narrative / Interim history: 79 year old Spanish-speaking male with PMH of CVA, LBBB, DM-2, CKD-3A, HLD and ambulatory dysfunction presenting with altered mental status, labored breathing, poor p.o. intake and bilateral lower extremity pain, and admitted for acute blood loss anemia, sinus tachycardia, AKI, hyperkalemia and severe sepsis due to LLL pneumonia.  Initially concern about A. fib which was ruled out by cardiology.  Hgb 6.4.  He Hemoccult was positive.  EGD revealed gastritis. Cr 3.5.  K6.2.  CXR with right suprahilar nodular opacity.  CTH negative.  CT abdomen and pelvis raises concern for LLL consolidation.  CT chest with patchy consolidation with air and LLL and LUL, 7 mm LUL nodule.    Patient was transfused 1 unit with appropriate response.  H&H stable.  He was started on IV vancomycin and cefepime but then de-escalated to IV Unasyn and doxycycline.  Main issue is his metabolic encephalopathy/confusion with intermittent agitation. Spiked fever to 102.3 with tachycardia, hypotension and leukopenia the evening of 11/25.  ID consulted and recommended MRI right knee, repeat CT chest, hematology consult and bone marrow biopsy.  MRI and repeat CT chest unrevealing.  Bone marrow biopsy scheduled for 12/1.   Subjective: Seen and examined earlier this morning with the help of video interpreter with ID number [id number].  Patient's son at bedside.  No major events overnight of this morning.  He denies pain.  He is oriented to self, his son, place but not time.  Per patient's son, this is his baseline.  Not able to move his legs, bend his knees and ankle.  Patient's son asking about therapy  Objective: Vitals:    11/06/21 0200 11/06/21 0400 11/06/21 0803 11/06/21 1137  BP: 131/71 (!) 128/54 127/67 93/68  Pulse:   69 77  Resp: _0 Temp:  98.9 F (37.2 C) 98.2 F (36.8 C) 98.1 F (36.7 C)  TempSrc:  Oral Oral Oral  SpO2:   98% 96%  Weight:      Height:        Intake/Output Summary (Last 24 hours) at 11/06/2021 1537 Last data filed at 11/06/2021 0807 Gross per 24 hour  Intake --  Output 550 ml  Net -550 ml   Filed Weights   10/29/21 1345  Weight: 63.6 kg    Examination:  GENERAL: No apparent distress.  Nontoxic. HEENT: MMM.  Vision and hearing grossly intact.  NECK: Supple.  No apparent JVD.  RESP: 96% on RA.  No IWOB.  Fair aeration bilaterally. CVS:  RRR. Heart sounds normal.  ABD/GI/GU: BS+. Abd soft, NTND.  MSK/EXT:  Moves extremities.  No focal tenderness in his knees or ankles.  Trace edema around right ankle.  Fair range of motion in his knees and ankles. SKIN: no apparent skin lesion or wound NEURO: Awake.  Oriented to self, person and place but not time.  No apparent focal neuro deficit. PSYCH: Calm. Normal affect.   Procedures:  11/31-EGD with erosive gastropathy and gastritis but no signs of bleeding.  Microbiology summarized: 11/20-COVID-19 and influenza PCR nonreactive. 11/21-MRSA PCR nonreactive. 11/20-blood culture and urine culture NGTD.  Assessment & Plan: Severe sepsis due to pneumonia: POA.  Heart tachycardia, tachypnea, AKI,  AMS and lactic acidosis to 6.8.  Culture data as above.  CT C/A/P concerning for LUL and LLL consolidation.  Repeat CT chest with improvement.  MRI right knee with arthritic changes and quadriceps tendon injuries.  Pro-Cal elevated but improving.  Last fever on 11/25.  Leukopenia persisted.  Repeat blood culture NGTD.  HIV and RPR unrevealing. -Infectious disease following. -11/20-Vanco and cefepime>> 11/23 Unasyn>> 11/26 -11/23 doxycycline>> for possible tickborne disease -Follow other infectious work-up ordered by  ID -IS/OOB/PT/OT  ABLA/IDA/GI bleed/erosive gastropathy/gastritis: Hgb 14.6 in 12/2019.  Hemoccult positive.  EGD with erosive gastropathy and gastritis.  H&H stable after 1 unit. Recent Labs    10/29/21 1559 10/30/21 0117 10/30/21 0743 10/31/21 0116 11/01/21 0213 11/02/21 0131 11/03/21 0130 11/04/21 0748 11/05/21 0227 11/06/21 0400  HGB 8.6* 7.6* 7.8* 8.0* 8.4* 8.3* 7.8* 7.6* 7.8* 7.9*  -Monitor H&H and platelets. -P.o. Protonix 40 mg twice daily for 8 weeks -GI following-colonoscopy when able to drink prep.   Hypotension/sinus tachycardia: Felt to be due to sepsis. TTE with LVEF of 50 to 55% and RVSP of 31 mmHg but no other significant finding.  Resolved. -Discontinue IV fluid.   AKI/azotemia on CKD-3A: Could be prerenal and ATN from diuretics, NSAID and sepsis.  Resolved Recent Labs    10/28/21 2039 10/29/21 0521 10/30/21 0117 10/31/21 0116 11/01/21 0213 11/02/21 0131 11/03/21 0130 11/04/21 0748 11/05/21 0227 11/06/21 0400  BUN 58* 51* 36* _0 CREATININE 2.68* 2.05* 1.50* 1.12 1.02 1.02 1.04 0.89 0.96 0.87  -Discontinue IV fluid. -Encourage oral intake  Acute metabolic encephalopathy/delirium: I believe he has some degree of cognitive impairment although family denied this.  He has history of CVA.  He is also having hospital delirium.  CTH, B12, ammonia and RPR unrevealing.  No nuchal rigidity to suggest meningitis.  He had 6 days of antibiotic for pneumonia.  No respiratory issues.  Lower extremity pain and stiffness, mainly in his right knee felt to be due to gout has now resolved.Marland Kitchen  He is oriented to self, place and person which is his baseline per his son. -Treat treatable causes as above -Reorientation and delirium precautions -Continue low-dose Seroquel at night  Uncontrolled DM-2 with hyperglycemia: A1c 7.5%. Recent Labs  Lab 11/05/21 1127 11/05/21 1547 11/05/21 1938 11/06/21 0806 11/06/21 1136  GLUCAP 116* 141* 136* 89 122*  -Continue  SSI-sensitive -Continue basal insulin 5 units daily   Acute pancytopenia-H&H is stable after transfusion.  Platelets improving.  Still leukopenic.  -Appreciate help by hematology and ID. -Infectious work-up per ID -Follow MM panel -Plan for bone marrow biopsy on 12/1.  7 mm LUL nodule.  Noted on CT chest. -Repeat CT chest in 6 to 12 months   Hyperkalemia: Likely due to AKI.  Resolved.  Hypomagnesemia: 1.4. -IV magnesium sulfate 4 g x 1   History of CVA: Stable. -Continue home aspirin and statin.  Left knee pain/tenderness/stiffness: MRI with severe arthritic changes and intermediate grade, partial width tearing of the central and lateral fibers of the distal quadriceps tendon at its insertion on the patella.  intermediate grade, partial width tearing of the central and lateral fibers of the distal quadriceps tendon at its insertion on the patella.  Uric acid low.  Pain resolved.  Improved range of motion. -Continue supportive care  Language barrier affecting communication -Used video interpreter   Physical deconditioning -PT/OT.  Increased nutrition need Body mass index is 25.65 kg/m. Signs/Symptoms: estimated needs Interventions: Ensure Enlive (each supplement  provides 350kcal and 20 grams of protein)   DVT prophylaxis:  SCDs Start: 10/28/21 2054 due to thrombocytopenia  Code Status: Full code Family Communication: Updated patient's son at bedside. Level of care: Progressive Status is: Inpatient  Remains inpatient appropriate because: Further evaluation for pancytopenia/infectious process       Consultants:  Gastroenterology-following Cardiology-signed off Infectious disease   Sch Meds:  Scheduled Meds:  allopurinol  150 mg Oral Daily   colchicine  0.6 mg Oral BID   diclofenac Sodium  2 g Topical QID   insulin aspart  0-5 Units Subcutaneous QHS   insulin aspart  0-9 Units Subcutaneous TID WC   insulin glargine-yfgn  5 Units Subcutaneous Daily    pantoprazole (PROTONIX) IV  40 mg Intravenous Q12H   QUEtiapine  25 mg Oral QHS   Continuous Infusions:  sodium chloride 100 mL/hr at 11/06/21 0318   PRN Meds:.acetaminophen **OR** acetaminophen, ibuprofen, metoprolol tartrate, traZODone  Antimicrobials: Anti-infectives (From admission, onward)    Start     Dose/Rate Route Frequency Ordered Stop   10/31/21 1300  doxycycline (VIBRA-TABS) tablet 100 mg  Status:  Discontinued        100 mg Oral Every 12 hours 10/31/21 1200 11/03/21 1458   10/31/21 1245  Ampicillin-Sulbactam (UNASYN) 3 g in sodium chloride 0.9 % 100 mL IVPB  Status:  Discontinued        3 g 200 mL/hr over 30 Minutes Intravenous Every 6 hours 10/31/21 1200 11/03/21 1458   10/30/21 1045  vancomycin (VANCOREADY) IVPB 1000 mg/200 mL  Status:  Discontinued        1,000 mg 200 mL/hr over 60 Minutes Intravenous Every 36 hours 10/30/21 0957 10/31/21 1200   10/30/21 1015  ceFEPIme (MAXIPIME) 2 g in sodium chloride 0.9 % 100 mL IVPB  Status:  Discontinued        2 g 200 mL/hr over 30 Minutes Intravenous Every 12 hours 10/30/21 1010 10/31/21 1200   10/29/21 2000  ceFEPIme (MAXIPIME) 2 g in sodium chloride 0.9 % 100 mL IVPB  Status:  Discontinued        2 g 200 mL/hr over 30 Minutes Intravenous Every 24 hours 10/28/21 2011 10/30/21 1010   10/28/21 2004  vancomycin variable dose per unstable renal function (pharmacist dosing)  Status:  Discontinued         Does not apply See admin instructions 10/28/21 2004 10/31/21 1200   10/28/21 1945  ceFEPIme (MAXIPIME) 2 g in sodium chloride 0.9 % 100 mL IVPB        2 g 200 mL/hr over 30 Minutes Intravenous  Once 10/28/21 1940 10/28/21 2028   10/28/21 1945  metroNIDAZOLE (FLAGYL) IVPB 500 mg        500 mg 100 mL/hr over 60 Minutes Intravenous  Once 10/28/21 1940 10/28/21 2051   10/28/21 1945  vancomycin (VANCOREADY) IVPB 1500 mg/300 mL        1,500 mg 150 mL/hr over 120 Minutes Intravenous  Once 10/28/21 1940 10/28/21 2213        I have  personally reviewed the following labs and images: CBC: Recent Labs  Lab 11/01/21 0213 11/02/21 0131 11/03/21 0130 11/04/21 0748 11/05/21 0227 11/05/21 1124 11/06/21 0400  WBC 4.2 3.8* 3.0* 2.7* 2.7*  --  2.5*  NEUTROABS 1.7  --   --   --  1.3*  --   --   HGB 8.4* 8.3* 7.8* 7.6* 7.8*  --  7.9*  HCT 24.3* 25.2* 22.7* 23.0* 23.0*  --  23.2*  MCV 89.7 90.6 90.4 91.6 91.3  --  91.7  PLT 47* 52* 46* 53* 57* 59* 66*   BMP &GFR Recent Labs  Lab 11/02/21 0131 11/03/21 0130 11/04/21 0748 11/05/21 0227 11/06/21 0400  NA 135 135 137 138 138  K 3.7 3.6 3.8 3.7 3.8  CL 102 105 108 109 109  CO2 _0 GLUCOSE 120* 142* 112* 94 105*  BUN _1 CREATININE 1.02 1.04 0.89 0.96 0.87  CALCIUM 7.9* 7.8* 7.7* 7.8* 7.9*  MG 1.8 1.6* 1.6* 1.8 1.4*  PHOS 3.3 3.8 3.1 3.2 2.8   Estimated Creatinine Clearance: 52.3 mL/min (by C-G formula based on SCr of 0.87 mg/dL). Liver & Pancreas: Recent Labs  Lab 10/31/21 0116 11/01/21 0213 11/02/21 0131 11/03/21 0130 11/04/21 0748 11/05/21 0227 11/06/21 0400  AST 16 31  --   --   --  20  --   ALT 18 35  --   --   --  36  --   ALKPHOS 55 60  --   --   --  90  --   BILITOT 1.1 0.9  --   --   --  0.9  --   PROT 4.5* 4.4*  --   --   --  4.5*  --   ALBUMIN 1.8* 1.8* 1.9* 1.8* 1.7* 1.8* 1.9*   No results for input(s): LIPASE, AMYLASE in the last 168 hours. Recent Labs  Lab 11/01/21 0213  AMMONIA 17   Diabetic: No results for input(s): HGBA1C in the last 72 hours.  Recent Labs  Lab 11/05/21 1127 11/05/21 1547 11/05/21 1938 11/06/21 0806 11/06/21 1136  GLUCAP 116* 141* 136* 89 122*   Cardiac Enzymes: Recent Labs  Lab 11/01/21 0213  CKTOTAL 27*   No results for input(s): PROBNP in the last 8760 hours. Coagulation Profile: No results for input(s): INR, PROTIME in the last 168 hours.  Thyroid Function Tests: No results for input(s): TSH, T4TOTAL, FREET4, T3FREE, THYROIDAB in the last 72 hours. Lipid Profile: No  results for input(s): CHOL, HDL, LDLCALC, TRIG, CHOLHDL, LDLDIRECT in the last 72 hours. Anemia Panel: No results for input(s): VITAMINB12, FOLATE, FERRITIN, TIBC, IRON, RETICCTPCT in the last 72 hours.  Urine analysis:    Component Value Date/Time   COLORURINE YELLOW 11/03/2021 [place] 11/03/2021 0734   LABSPEC 1.016 11/03/2021 0734   PHURINE 7.0 11/03/2021 0734   GLUCOSEU NEGATIVE 11/03/2021 0734   HGBUR SMALL (A) 11/03/2021 [place] 11/03/2021 [place] 11/03/2021 0734   PROTEINUR NEGATIVE 11/03/2021 0734   NITRITE NEGATIVE 11/03/2021 0734   LEUKOCYTESUR NEGATIVE 11/03/2021 0734   Sepsis Labs: Invalid input(s): PROCALCITONIN, [place]  Microbiology: Recent Results (from the past 240 hour(s))  Blood Culture (routine x 2)     Status: None   Collection Time: 10/28/21  6:20 PM   Specimen: BLOOD LEFT HAND  Result Value Ref Range Status   Specimen Description BLOOD LEFT HAND  Final   Special Requests   Final    BOTTLES DRAWN AEROBIC ONLY Blood Culture adequate volume   Culture   Final    NO GROWTH 5 DAYS Performed at [facility], [address], [place], [place] [address]    Report Status 11/02/2021 FINAL  Final  Blood Culture (routine x 2)     Status: None   Collection Time: 10/28/21  6:46 PM   Specimen: BLOOD LEFT  ARM  Result Value Ref Range Status   Specimen Description BLOOD LEFT ARM  Final   Special Requests   Final    BOTTLES DRAWN AEROBIC AND ANAEROBIC Blood Culture adequate volume   Culture   Final    NO GROWTH 5 DAYS Performed at [facility], [address], [place], [place] [address]    Report Status 11/02/2021 FINAL  Final  Resp Panel by RT-PCR (Flu A&B, Covid) Nasopharyngeal Swab     Status: None   Collection Time: 10/28/21  6:48 PM   Specimen: Nasopharyngeal Swab; Nasopharyngeal(NP) swabs in vial transport medium  Result Value Ref Range Status   SARS Coronavirus 2 by RT PCR NEGATIVE  NEGATIVE Final    Comment: (NOTE) SARS-CoV-2 target nucleic acids are NOT DETECTED.  The SARS-CoV-2 RNA is generally detectable in upper respiratory specimens during the acute phase of infection. The lowest concentration of SARS-CoV-2 viral copies this assay can detect is 138 copies/mL. A negative result does not preclude SARS-Cov-2 infection and should not be used as the sole basis for treatment or other patient management decisions. A negative result may occur with  improper specimen collection/handling, submission of specimen other than nasopharyngeal swab, presence of viral mutation(s) within the areas targeted by this assay, and inadequate number of viral copies(<138 copies/mL). A negative result must be combined with clinical observations, patient history, and epidemiological information. The expected result is Negative.  Fact Sheet for Patients:  EntrepreneurPulse.com.au  Fact Sheet for Healthcare Providers:  IncredibleEmployment.be  This test is no t yet approved or cleared by the [place] FDA and  has been authorized for detection and/or diagnosis of SARS-CoV-2 by FDA under an Emergency Use Authorization (EUA). This EUA will remain  in effect (meaning this test can be used) for the duration of the COVID-19 declaration under Section 564(b)(1) of the Act, 21 U.S.C.section 360bbb-3(b)(1), unless the authorization is terminated  or revoked sooner.       Influenza A by PCR NEGATIVE NEGATIVE Final   Influenza B by PCR NEGATIVE NEGATIVE Final    Comment: (NOTE) The Xpert Xpress SARS-CoV-2/FLU/RSV plus assay is intended as an aid in the diagnosis of influenza from Nasopharyngeal swab specimens and should not be used as a sole basis for treatment. Nasal washings and aspirates are unacceptable for Xpert Xpress SARS-CoV-2/FLU/RSV testing.  Fact Sheet for Patients: EntrepreneurPulse.com.au  Fact Sheet for Healthcare  Providers: IncredibleEmployment.be  This test is not yet approved or cleared by the [place] FDA and has been authorized for detection and/or diagnosis of SARS-CoV-2 by FDA under an Emergency Use Authorization (EUA). This EUA will remain in effect (meaning this test can be used) for the duration of the COVID-19 declaration under Section 564(b)(1) of the Act, 21 U.S.C. section 360bbb-3(b)(1), unless the authorization is terminated or revoked.  Performed at [facility], [place] [address], [place], [place] [address]   Urine Culture     Status: None   Collection Time: 10/28/21  8:51 PM   Specimen: In/Out Cath Urine  Result Value Ref Range Status   Specimen Description IN/OUT CATH URINE  Final   Special Requests NONE  Final   Culture   Final    NO GROWTH Performed at [facility], [place] [address] [place], [place] [address]    Report Status 10/29/2021 FINAL  Final  MRSA Next Gen by PCR, Nasal     Status: None   Collection Time: 10/29/21  8:51 AM   Specimen: Nasal  Mucosa; Nasal Swab  Result Value Ref Range Status   MRSA by PCR Next Gen NOT DETECTED NOT DETECTED Final    Comment: (NOTE) The GeneXpert MRSA Assay (FDA approved for NASAL specimens only), is one component of a comprehensive MRSA colonization surveillance program. It is not intended to diagnose MRSA infection nor to guide or monitor treatment for MRSA infections. Test performance is not FDA approved in patients less than 4 years old. Performed at [facility], [place] [address], [place], [place] [address]   Culture, blood (routine x 2)     Status: None (Preliminary result)   Collection Time: 11/03/21  7:58 AM   Specimen: BLOOD  Result Value Ref Range Status   Specimen Description BLOOD LEFT ANTECUBITAL  Final   Special Requests   Final    BOTTLES DRAWN AEROBIC ONLY Blood Culture results may not be optimal due to an inadequate volume of blood received in culture bottles   Culture    Final    NO GROWTH 3 DAYS Performed at [facility], [place] [address], [place], [place] [address]    Report Status PENDING  Incomplete  Culture, blood (routine x 2)     Status: None (Preliminary result)   Collection Time: 11/03/21  7:58 AM   Specimen: BLOOD LEFT HAND  Result Value Ref Range Status   Specimen Description BLOOD LEFT HAND  Final   Special Requests   Final    BOTTLES DRAWN AEROBIC AND ANAEROBIC Blood Culture results may not be optimal due to an inadequate volume of blood received in culture bottles   Culture   Final    NO GROWTH 3 DAYS Performed at [facility], [place] [address] [place], [place] [address]    Report Status PENDING  Incomplete    Radiology Studies: No results found.   Katelyne Galster T. [place]  If 7PM-7AM, please contact night-coverage [email or web address] 11/06/2021, 3:37 PM

## 2021-11-07 ENCOUNTER — Other Ambulatory Visit: Payer: Self-pay | Admitting: Radiology

## 2021-11-07 DIAGNOSIS — Z20822 Contact with and (suspected) exposure to covid-19: Secondary | ICD-10-CM | POA: Diagnosis not present

## 2021-11-07 DIAGNOSIS — J189 Pneumonia, unspecified organism: Secondary | ICD-10-CM | POA: Diagnosis not present

## 2021-11-07 DIAGNOSIS — D62 Acute posthemorrhagic anemia: Secondary | ICD-10-CM | POA: Diagnosis not present

## 2021-11-07 DIAGNOSIS — G9341 Metabolic encephalopathy: Secondary | ICD-10-CM | POA: Diagnosis not present

## 2021-11-07 DIAGNOSIS — A419 Sepsis, unspecified organism: Secondary | ICD-10-CM | POA: Diagnosis not present

## 2021-11-07 DIAGNOSIS — N179 Acute kidney failure, unspecified: Secondary | ICD-10-CM | POA: Diagnosis not present

## 2021-11-07 DIAGNOSIS — R509 Fever, unspecified: Secondary | ICD-10-CM | POA: Diagnosis not present

## 2021-11-07 LAB — RENAL FUNCTION PANEL
Albumin: 1.9 g/dL — ABNORMAL LOW (ref 3.5–5.0)
Anion gap: 8 (ref 5–15)
BUN: 6 mg/dL — ABNORMAL LOW (ref 8–23)
CO2: 20 mmol/L — ABNORMAL LOW (ref 22–32)
Calcium: 7.7 mg/dL — ABNORMAL LOW (ref 8.9–10.3)
Chloride: 109 mmol/L (ref 98–111)
Creatinine, Ser: 0.78 mg/dL (ref 0.61–1.24)
GFR, Estimated: >60 mL/min (ref >60)
Glucose, Bld: 102 mg/dL — ABNORMAL HIGH (ref 70–99)
Phosphorus: 2.7 mg/dL (ref 2.5–4.6)
Potassium: 3.3 mmol/L — ABNORMAL LOW (ref 3.5–5.1)
Sodium: 137 mmol/L (ref 135–145)

## 2021-11-07 LAB — GLUCOSE, CAPILLARY
Glucose-Capillary: 103 mg/dL — ABNORMAL HIGH (ref 70–99)
Glucose-Capillary: 154 mg/dL — ABNORMAL HIGH (ref 70–99)
Glucose-Capillary: 141 mg/dL — ABNORMAL HIGH (ref 70–99)
Glucose-Capillary: 97 mg/dL (ref 70–99)
Glucose-Capillary: 133 mg/dL — ABNORMAL HIGH (ref 70–99)

## 2021-11-07 LAB — CBC
HCT: 22.3 % — ABNORMAL LOW (ref 39.0–52.0)
HCT: 25.3 % — ABNORMAL LOW (ref 39.0–52.0)
HCT: 22 % — ABNORMAL LOW (ref 39.0–52.0)
Hemoglobin: 7.6 g/dL — ABNORMAL LOW (ref 13.0–17.0)
Hemoglobin: 8.6 g/dL — ABNORMAL LOW (ref 13.0–17.0)
Hemoglobin: 7.3 g/dL — ABNORMAL LOW (ref 13.0–17.0)
MCH: 30.8 pg (ref 26.0–34.0)
MCH: 30.8 pg (ref 26.0–34.0)
MCH: 30.2 pg (ref 26.0–34.0)
MCHC: 34.1 g/dL (ref 30.0–36.0)
MCHC: 34 g/dL (ref 30.0–36.0)
MCHC: 33.2 g/dL (ref 30.0–36.0)
MCV: 90.3 fL (ref 80.0–100.0)
MCV: 90.7 fL (ref 80.0–100.0)
MCV: 90.9 fL (ref 80.0–100.0)
Platelets: 66 10*3/uL — ABNORMAL LOW (ref 150–400)
Platelets: 80 10*3/uL — ABNORMAL LOW (ref 150–400)
Platelets: 70 10*3/uL — ABNORMAL LOW (ref 150–400)
RBC: 2.47 MIL/uL — ABNORMAL LOW (ref 4.22–5.81)
RBC: 2.79 MIL/uL — ABNORMAL LOW (ref 4.22–5.81)
RBC: 2.42 MIL/uL — ABNORMAL LOW (ref 4.22–5.81)
RDW: 14.8 % (ref 11.5–15.5)
RDW: 14.8 % (ref 11.5–15.5)
RDW: 15 % (ref 11.5–15.5)
WBC: 2.6 10*3/uL — ABNORMAL LOW (ref 4.0–10.5)
WBC: 3.1 10*3/uL — ABNORMAL LOW (ref 4.0–10.5)
WBC: 2.8 10*3/uL — ABNORMAL LOW (ref 4.0–10.5)
nRBC: 0 % (ref 0.0–0.2)
nRBC: 0 % (ref 0.0–0.2)
nRBC: 0 % (ref 0.0–0.2)

## 2021-11-07 LAB — TROPONIN I (HIGH SENSITIVITY)
Troponin I (High Sensitivity): 7 ng/L (ref <18)
Troponin I (High Sensitivity): 6 ng/L (ref <18)

## 2021-11-07 LAB — MULTIPLE MYELOMA PANEL, SERUM
Albumin SerPl Elph-Mcnc: 2.2 g/dL — ABNORMAL LOW (ref 2.9–4.4)
Albumin/Glob SerPl: 1.1 (ref 0.7–1.7)
Alpha 1: 0.3 g/dL (ref 0.0–0.4)
Alpha2 Glob SerPl Elph-Mcnc: 0.7 g/dL (ref 0.4–1.0)
B-Globulin SerPl Elph-Mcnc: 0.8 g/dL (ref 0.7–1.3)
Gamma Glob SerPl Elph-Mcnc: 0.3 g/dL — ABNORMAL LOW (ref 0.4–1.8)
Globulin, Total: 2.1 g/dL — ABNORMAL LOW (ref 2.2–3.9)
IgA: 104 mg/dL (ref 61–437)
IgG (Immunoglobin G), Serum: 462 mg/dL — ABNORMAL LOW (ref 603–1613)
IgM (Immunoglobulin M), Srm: 29 mg/dL (ref 15–143)
Total Protein ELP: 4.3 g/dL — ABNORMAL LOW (ref 6.0–8.5)

## 2021-11-07 LAB — QUANTIFERON-TB GOLD PLUS: QuantiFERON-TB Gold Plus: NEGATIVE

## 2021-11-07 LAB — QUANTIFERON-TB GOLD PLUS (RQFGPL)
QuantiFERON Mitogen Value: 0.97 IU/mL
QuantiFERON Nil Value: 0.04 IU/mL
QuantiFERON TB1 Ag Value: 0.04 IU/mL
QuantiFERON TB2 Ag Value: 0.05 IU/mL

## 2021-11-07 LAB — CORTISOL: Cortisol, Plasma: 17.7 ug/dL

## 2021-11-07 LAB — LACTIC ACID, PLASMA
Lactic Acid, Venous: 1.6 mmol/L (ref 0.5–1.9)
Lactic Acid, Venous: 1.5 mmol/L (ref 0.5–1.9)

## 2021-11-07 LAB — MAGNESIUM: Magnesium: 1.7 mg/dL (ref 1.7–2.4)

## 2021-11-07 LAB — PROCALCITONIN: Procalcitonin: <0.1 ng/mL

## 2021-11-07 LAB — KAPPA/LAMBDA LIGHT CHAINS
Kappa free light chain: 16.9 mg/L (ref 3.3–19.4)
Kappa, lambda light chain ratio: 0.98 (ref 0.26–1.65)
Lambda free light chains: 17.2 mg/L (ref 5.7–26.3)

## 2021-11-07 LAB — HAPTOGLOBIN: Haptoglobin: 293 mg/dL (ref 34–355)

## 2021-11-07 LAB — PREPARE RBC (CROSSMATCH)

## 2021-11-07 MED ORDER — RINGERS IV SOLN: INTRAVENOUS | Status: DC

## 2021-11-07 MED ORDER — SODIUM CHLORIDE 0.9 % IV BOLUS
1000.0000 mL | Freq: Once | INTRAVENOUS | Status: AC
  Administered 2021-11-07: 1000 mL via INTRAVENOUS

## 2021-11-07 MED ORDER — MAGNESIUM SULFATE 2 GM/50ML IV SOLN
2.0000 g | Freq: Once | INTRAVENOUS | Status: AC
  Administered 2021-11-07: 2 g via INTRAVENOUS
  Filled 2021-11-07: qty 50

## 2021-11-07 MED ORDER — SODIUM CHLORIDE 0.9% IV SOLUTION: Freq: Once | INTRAVENOUS | Status: DC

## 2021-11-07 MED ORDER — POTASSIUM CHLORIDE CRYS ER 20 MEQ PO TBCR
40.0000 meq | EXTENDED_RELEASE_TABLET | Freq: Once | ORAL | Status: AC
  Administered 2021-11-07: 40 meq via ORAL
  Filled 2021-11-07: qty 2

## 2021-11-07 NOTE — Progress Notes (Addendum)
PROGRESS NOTE    Eugene Cooper  HYW:[id number] DOB: May 30, 1941 DOA: 10/28/2021 PCP: Emelia Loron, NP   Brief Narrative: 80 year old Spanish-speaking male with past medical history significant for CVA, LBBB, diabetes type 2, CKD stage IIIa, HLD, ambulatory dysfunction who presents with altered mental status, poor oral intake, bilateral lower extremity pain, admitted for acute blood loss anemia, sinus tachycardia, AKI, hyperkalemia and severe sepsis secondary to left lower lobe pneumonia.  Initially there was concern about a fib  which was ruled out by cardiology.  Hemoglobin on admission 6.4, occult blood positive, EGD revealed gastritis.  Patient creatinine 2.5, potassium 6.2.  Chest x-ray right suprahilar opacity.  CT head negative.  CT abdomen pelvis with concern for fluid overload pulm consolidation.  CT chest with patchy consolidation within the left lower lobe and left upper lobe nodule.  Patient was transfused 1 unit of packed red blood cell with appropriate response.  Hemoglobin had remained stable.  He was initially on vancomycin and cefepime but subsequently antibiotics de-escalated to IV Unasyn and doxycycline.  He was also found to have acute metabolic encephalopathy, confusion.  He is spiking fever 102, tachycardia hypotension and leukopenia if 11/25.  ID consulted and recommended MRI of the right knee, repeat CT chest, hematology consult and bone marrow biopsy.  MRI and repeated CT chest unrevealing.  Bone marrow biopsy schedule for 12/1.  11/30; hypotensive systolic blood pressure in the 50--responded to IV fluids.  Last blood pressure 100 range.     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 CT chest concerning for left upper lobe and left lower lobe consolidation. MRI right knee with arthritic changes and quadricep tendon injury. Last fever 11/25. HIV and RPR unrevealing.  Blood cultures no growth today.  2-Fever unknown etiology: -Spiked fever 11/25 -Completed treatment for pneumonia. -ID recommended bone marrow biopsy -Completed antibiotics. -QuantiFERON for TB negative.  Cryptococcal antigen negative.  Blastomycosis pending, histoplasma antigen pending.  3-Hypotension: -Recurrent hypotension 11/30. -TTE on admission normal left ventricular ejection fraction -Patient became hypotensive 11/30.  Systolic blood pressure dropped to the 50s.  He was symptomatic reported lightheadedness. -Blood pressure improved with 1 L of IV fluids. -Repeating CBC, 1 unit of packed red blood cells ordered. -Checking cortisol level, hemoglobin, troponin. Addendum: Hb 8.6 will hold blood transfusion. Repeat cbc tonight.   4-ABL/IDA/GI bleed/erosive gastropathy/gastritis - EGD showed erosive gastropathy and gastritis -Received 1 unit of packed red blood cell. -He will need colonoscopy when able to drink prep -Oral Protonix twice daily for 8 weeks.  5-AKI on CKD stage IIIa: Secondary to sepsis, diuretic. Resolved with IV fluids.  6-Acute metabolic encephalopathy/delirium History of CVA CT head, B12 ammonia and RPR unrevealing. Mental status improving  7-Diabetes type 2  uncontrolled hyperglycemia: Hemoglobin A1c 7.5 -Continue  with a sliding scale insulin -Continue with basal insulin 5 units daily  8-Pancytopenia -Hematology oncology and ID following -MM panel pending -Plan for bone marrow biopsy 12/1  9-7 mm LUL nodule: He will need repeat CT chest in 6-12.  Hyperkalemia: Resolved Hypomagnesemia:  Replace Left knee pain/tenderness and stiffness: MRI with severe arthritis changes standing intermediate grade partial width tearing of the central and lateral fibers of the distal quadriceps tendon at its insertion on the patella. Pain improving.  Continue with supportive care. History of gout: Continue with colchicine    Nutrition Problem: Increased nutrient needs Etiology: acute illness    Signs/Symptoms: estimated needs    Interventions: Ensure Enlive (each supplement provides 350kcal and 20 grams of protein)  Estimated body mass index is 25.65 kg/m as calculated from the following:   Height as of this encounter: 5' 2"  (1.575 m).   Weight as of this encounter: 63.6 kg.   DVT prophylaxis: SCDs Code Status: Full code Family Communication: Discussed with son who was at bedside Disposition Plan:  Status is: Inpatient  Remains inpatient appropriate because: Patient with altered mental status, hypotension, pancytopenia        Consultants:  ID Hematology   Procedures:    Antimicrobials:    Subjective: Seen this morning, he reporter doing okay complaining of knee pain.  He was alert and oriented to time and place.  This afternoon patient became lightheaded, systolic blood pressure dropped to 54 subsequently increased to 75.  He received 1 L of IV fluids blood pressure subsequently increased to 100  Objective: Vitals:   11/06/21 1540 11/06/21 1931 11/06/21 2300 11/07/21 0351  BP: 117/64 108/71 (!) 91/52 (!) 97/57  Pulse: 68 74 77   Resp: 17 17 15 16   Temp: 98.7 F (37.1 C) 98.7 F (37.1 C)  98 F (36.7 C)  TempSrc: Oral Oral  Axillary  SpO2: 97% 97% 95%   Weight:       Height:        Intake/Output Summary (Last 24 hours) at 11/07/2021 0808 Last data filed at 11/06/2021 1541 Gross per 24 hour  Intake --  Output 500 ml  Net -500 ml   Filed Weights   10/29/21 1345  Weight: 63.6 kg    Examination:  General exam: Appears calm and comfortable  Respiratory system: Clear to auscultation. Respiratory effort normal. Cardiovascular system: S1 & S2 heard, RRR. Gastrointestinal system: Abdomen is nondistended, soft and nontender. No organomegaly or masses felt. Normal bowel sounds heard. Central nervous system: Alert and oriented.  Extremities: Symmetric 5 x 5 power.  Data Reviewed: I have personally reviewed following labs and imaging studies  CBC: Recent Labs  Lab 11/01/21 0213 11/02/21 0131 11/03/21 0130 11/04/21 0748 11/05/21 0227 11/05/21 1124 11/06/21 0400 11/07/21 0257  WBC 4.2   < > 3.0* 2.7* 2.7*  --  2.5* 2.6*  NEUTROABS 1.7  --   --   --  1.3*  --   --   --   HGB 8.4*   < > 7.8* 7.6* 7.8*  --  7.9* 7.6*  HCT 24.3*   < > 22.7* 23.0* 23.0*  --  23.2* 22.3*  MCV 89.7   < > 90.4 91.6 91.3  --  91.7 90.3  PLT 47*   < > 46* 53* 57* 59* 66* 66*   < > = values in this interval not displayed.   Basic Metabolic Panel: Recent Labs  Lab 11/03/21 0130 11/04/21  2633 11/05/21 0227 11/06/21 0400 11/07/21 0257  NA 135 137 138 138 137  K 3.6 3.8 3.7 3.8 3.3*  CL 105 108 109 109 109  CO2 24 22 24 24  20*  GLUCOSE 142* 112* 94 105* 102*  BUN 17 15 13 9  6*  CREATININE 1.04 0.89 0.96 0.87 0.78  CALCIUM 7.8* 7.7* 7.8* 7.9* 7.7*  MG 1.6* 1.6* 1.8 1.4* 1.7  PHOS 3.8 3.1 3.2 2.8 2.7   GFR: Estimated Creatinine Clearance: 56.9 mL/min (by C-G formula based on SCr of 0.78 mg/dL). Liver Function Tests: Recent Labs  Lab 11/01/21 0213 11/02/21 0131 11/03/21 0130 11/04/21 0748 11/05/21 0227 11/06/21 0400 11/07/21 0257  AST 31  --   --   --  20  --   --   ALT 35  --   --   --  36  --   --   ALKPHOS 60  --   --   --  90  --   --   BILITOT  0.9  --   --   --  0.9  --   --   PROT 4.4*  --   --   --  4.5*  --   --   ALBUMIN 1.8*   < > 1.8* 1.7* 1.8* 1.9* 1.9*   < > = values in this interval not displayed.   No results for input(s): LIPASE, AMYLASE in the last 168 hours. Recent Labs  Lab 11/01/21 0213  AMMONIA 17   Coagulation Profile: No results for input(s): INR, PROTIME in the last 168 hours. Cardiac Enzymes: Recent Labs  Lab 11/01/21 0213  CKTOTAL 27*   BNP (last 3 results) No results for input(s): PROBNP in the last 8760 hours. HbA1C: No results for input(s): HGBA1C in the last 72 hours. CBG: Recent Labs  Lab 11/05/21 1938 11/06/21 0806 11/06/21 1136 11/06/21 1542 11/06/21 1930  GLUCAP 136* 89 122* 121* 135*   Lipid Profile: No results for input(s): CHOL, HDL, LDLCALC, TRIG, CHOLHDL, LDLDIRECT in the last 72 hours. Thyroid Function Tests: No results for input(s): TSH, T4TOTAL, FREET4, T3FREE, THYROIDAB in the last 72 hours. Anemia Panel: No results for input(s): VITAMINB12, FOLATE, FERRITIN, TIBC, IRON, RETICCTPCT in the last 72 hours. Sepsis Labs: Recent Labs  Lab 11/02/21 0131 11/03/21 0130 11/04/21 0050  PROCALCITON 0.26 0.21 0.16    Recent Results (from the past 240 hour(s))  Blood Culture (routine x 2)     Status: None   Collection Time: 10/28/21  6:20 PM   Specimen: BLOOD LEFT HAND  Result Value Ref Range Status   Specimen Description BLOOD LEFT HAND  Final   Special Requests   Final    BOTTLES DRAWN AEROBIC ONLY Blood Culture adequate volume   Culture   Final    NO GROWTH 5 DAYS Performed at [facility], [place] [address], [place], [place] [address]    Report Status 11/02/2021 FINAL  Final  Blood Culture (routine x 2)     Status: None   Collection Time: 10/28/21  6:46 PM   Specimen: BLOOD LEFT ARM  Result Value Ref Range Status   Specimen Description BLOOD LEFT ARM  Final   Special Requests   Final    BOTTLES DRAWN AEROBIC AND ANAEROBIC Blood Culture adequate volume    Culture   Final    NO GROWTH 5 DAYS Performed at [facility], [place] [address] [place], [place] [address]    Report Status 11/02/2021 FINAL  Final  Resp Panel by RT-PCR (Flu A&B, Covid) Nasopharyngeal Swab     Status: None   Collection Time: 10/28/21  6:48 PM   Specimen: Nasopharyngeal Swab; Nasopharyngeal(NP) swabs in vial transport medium  Result Value Ref Range Status   SARS Coronavirus 2 by RT PCR NEGATIVE NEGATIVE Final    Comment: (NOTE) SARS-CoV-2 target nucleic acids are NOT DETECTED.  The SARS-CoV-2 RNA is generally detectable in upper respiratory specimens during the acute phase of infection. The lowest concentration of SARS-CoV-2 viral copies this assay can detect is 138 copies/mL. A negative result does not preclude SARS-Cov-2 infection and should not be used as the sole basis for treatment or other patient management decisions. A negative result may occur with  improper specimen collection/handling, submission of specimen other than nasopharyngeal swab, presence of viral mutation(s) within the areas targeted by this assay, and inadequate number of viral copies(<138 copies/mL). A negative result must be combined with clinical observations, patient history, and epidemiological information. The expected result is Negative.  Fact Sheet for Patients:  EntrepreneurPulse.com.au  Fact Sheet for Healthcare Providers:  IncredibleEmployment.be  This test is no t yet approved or cleared by the [place] FDA and  has been authorized for detection and/or diagnosis of SARS-CoV-2 by FDA under an Emergency Use Authorization (EUA). This EUA will remain  in effect (meaning this test can be used) for the duration of the COVID-19 declaration under Section 564(b)(1) of the Act, 21 U.S.C.section 360bbb-3(b)(1), unless the authorization is terminated  or revoked sooner.       Influenza A by PCR NEGATIVE NEGATIVE Final   Influenza B by PCR  NEGATIVE NEGATIVE Final    Comment: (NOTE) The Xpert Xpress SARS-CoV-2/FLU/RSV plus assay is intended as an aid in the diagnosis of influenza from Nasopharyngeal swab specimens and should not be used as a sole basis for treatment. Nasal washings and aspirates are unacceptable for Xpert Xpress SARS-CoV-2/FLU/RSV testing.  Fact Sheet for Patients: EntrepreneurPulse.com.au  Fact Sheet for Healthcare Providers: IncredibleEmployment.be  This test is not yet approved or cleared by the [place] FDA and has been authorized for detection and/or diagnosis of SARS-CoV-2 by FDA under an Emergency Use Authorization (EUA). This EUA will remain in effect (meaning this test can be used) for the duration of the COVID-19 declaration under Section 564(b)(1) of the Act, 21 U.S.C. section 360bbb-3(b)(1), unless the authorization is terminated or revoked.  Performed at [facility], [place] [address], [place], [place] [address]   Urine Culture     Status: None   Collection Time: 10/28/21  8:51 PM   Specimen: In/Out Cath Urine  Result Value Ref Range Status   Specimen Description IN/OUT CATH URINE  Final   Special Requests NONE  Final   Culture   Final    NO GROWTH Performed at [facility], [place] [address], [place], [place] [address]    Report Status 10/29/2021 FINAL  Final  MRSA Next Gen by PCR, Nasal     Status: None   Collection Time: 10/29/21  8:51 AM   Specimen: Nasal Mucosa; Nasal Swab  Result Value Ref Range Status   MRSA by PCR Next Gen NOT DETECTED NOT DETECTED Final    Comment: (NOTE) The GeneXpert MRSA Assay (FDA approved for NASAL specimens only), is one component of a comprehensive MRSA colonization surveillance program. It is not intended to diagnose MRSA infection nor to guide or monitor treatment for MRSA infections. Test performance is not FDA approved in  patients less than 34 years old. Performed at [facility],  [place] [address], [place], [place] [address]   Culture, blood (routine x 2)     Status: None (Preliminary result)   Collection Time: 11/03/21  7:58 AM   Specimen: BLOOD  Result Value Ref Range Status   Specimen Description BLOOD LEFT ANTECUBITAL  Final   Special Requests   Final    BOTTLES DRAWN AEROBIC ONLY Blood Culture results may not be optimal due to an inadequate volume of blood received in culture bottles   Culture   Final    NO GROWTH 4 DAYS Performed at [facility], [place] [address] [place], [place] [address]    Report Status PENDING  Incomplete  Culture, blood (routine x 2)     Status: None (Preliminary result)   Collection Time: 11/03/21  7:58 AM   Specimen: BLOOD LEFT HAND  Result Value Ref Range Status   Specimen Description BLOOD LEFT HAND  Final   Special Requests   Final    BOTTLES DRAWN AEROBIC AND ANAEROBIC Blood Culture results may not be optimal due to an inadequate volume of blood received in culture bottles   Culture   Final    NO GROWTH 4 DAYS Performed at [facility], [place] [address] [place], [place] [address]    Report Status PENDING  Incomplete         Radiology Studies: No results found.      Scheduled Meds:  allopurinol  150 mg Oral Daily   colchicine  0.6 mg Oral BID   diclofenac Sodium  2 g Topical QID   insulin aspart  0-5 Units Subcutaneous QHS   insulin aspart  0-9 Units Subcutaneous TID WC   insulin glargine-yfgn  5 Units Subcutaneous Daily   pantoprazole (PROTONIX) IV  40 mg Intravenous Q12H   potassium chloride  40 mEq Oral Once   QUEtiapine  25 mg Oral QHS   Continuous Infusions:  magnesium sulfate bolus IVPB       LOS: 10 days    Time spent: 35 minutes    Koray Soter A Adyn Serna, MD [facility]   If 7PM-7AM, please contact night-coverage [email or web address]  11/07/2021, 8:08 AM

## 2021-11-07 NOTE — Progress Notes (Signed)
Subjective:  He told me that he feels poorly today and hurts all over his body  Antibiotics:  Anti-infectives (From admission, onward)    Start     Dose/Rate Route Frequency Ordered Stop   10/31/21 1300  doxycycline (VIBRA-TABS) tablet 100 mg  Status:  Discontinued        100 mg Oral Every 12 hours 10/31/21 1200 11/03/21 1458   10/31/21 1245  Ampicillin-Sulbactam (UNASYN) 3 g in sodium chloride 0.9 % 100 mL IVPB  Status:  Discontinued        3 g 200 mL/hr over 30 Minutes Intravenous Every 6 hours 10/31/21 1200 11/03/21 1458   10/30/21 1045  vancomycin (VANCOREADY) IVPB 1000 mg/200 mL  Status:  Discontinued        1,000 mg 200 mL/hr over 60 Minutes Intravenous Every 36 hours 10/30/21 0957 10/31/21 1200   10/30/21 1015  ceFEPIme (MAXIPIME) 2 g in sodium chloride 0.9 % 100 mL IVPB  Status:  Discontinued        2 g 200 mL/hr over 30 Minutes Intravenous Every 12 hours 10/30/21 1010 10/31/21 1200   10/29/21 2000  ceFEPIme (MAXIPIME) 2 g in sodium chloride 0.9 % 100 mL IVPB  Status:  Discontinued        2 g 200 mL/hr over 30 Minutes Intravenous Every 24 hours 10/28/21 2011 10/30/21 1010   10/28/21 2004  vancomycin variable dose per unstable renal function (pharmacist dosing)  Status:  Discontinued         Does not apply See admin instructions 10/28/21 2004 10/31/21 1200   10/28/21 1945  ceFEPIme (MAXIPIME) 2 g in sodium chloride 0.9 % 100 mL IVPB        2 g 200 mL/hr over 30 Minutes Intravenous  Once 10/28/21 1940 10/28/21 2028   10/28/21 1945  metroNIDAZOLE (FLAGYL) IVPB 500 mg        500 mg 100 mL/hr over 60 Minutes Intravenous  Once 10/28/21 1940 10/28/21 2051   10/28/21 1945  vancomycin (VANCOREADY) IVPB 1500 mg/300 mL        1,500 mg 150 mL/hr over 120 Minutes Intravenous  Once 10/28/21 1940 10/28/21 2213       Medications: Scheduled Meds:  sodium chloride   Intravenous Once   allopurinol  150 mg Oral Daily   colchicine  0.6 mg Oral BID   diclofenac Sodium  2 g  Topical QID   insulin aspart  0-5 Units Subcutaneous QHS   insulin aspart  0-9 Units Subcutaneous TID WC   insulin glargine-yfgn  5 Units Subcutaneous Daily   pantoprazole (PROTONIX) IV  40 mg Intravenous Q12H   QUEtiapine  25 mg Oral QHS   Continuous Infusions:  ringers 75 mL/hr at 11/07/21 1333   PRN Meds:.acetaminophen **OR** acetaminophen, metoprolol tartrate, traZODone    Objective: Weight change:   Intake/Output Summary (Last 24 hours) at 11/07/2021 1428 Last data filed at 11/07/2021 0900 Gross per 24 hour  Intake 290 ml  Output 1450 ml  Net -1160 ml    Blood pressure 121/78, pulse 90, temperature 98.5 F (36.9 C), temperature source Oral, resp. rate 12, height 5' 2"  (1.575 m), weight 63.6 kg, SpO2 95 %. Temp:  [98 F (36.7 C)-99.1 F (37.3 C)] 98.5 F (36.9 C) (11/30 1301) Pulse Rate:  [68-90] 90 (11/30 1159) Resp:  [12-20] 12 (11/30 1400) BP: (54-121)/(39-79) 121/78 (11/30 1400) SpO2:  [95 %-97 %] 95 % (11/30 1325)  Physical Exam: Physical Exam Vitals  and nursing note reviewed.  HENT:     Head: Normocephalic and atraumatic.  Eyes:     General:        Right eye: No discharge.        Left eye: No discharge.     Extraocular Movements: Extraocular movements intact.  Cardiovascular:     Rate and Rhythm: Normal rate and regular rhythm.  Pulmonary:     Effort: Pulmonary effort is normal. No respiratory distress.     Breath sounds: No wheezing.  Abdominal:     General: There is no distension.  Neurological:     General: No focal deficit present.     Mental Status: He is alert.  Psychiatric:        Attention and Perception: Attention normal.        Mood and Affect: Mood is anxious and depressed.        Speech: Speech is delayed.        Behavior: Behavior normal. Behavior is cooperative.        Cognition and Memory: Cognition is impaired. Memory is impaired. He exhibits impaired recent memory and impaired remote memory.    He says that his pain in his  knees has improved    CBC:    BMET Recent Labs    11/06/21 0400 11/07/21 0257  NA 138 137  K 3.8 3.3*  CL 109 109  CO2 24 20*  GLUCOSE 105* 102*  BUN 9 6*  CREATININE 0.87 0.78  CALCIUM 7.9* 7.7*      Liver Panel  Recent Labs    11/05/21 0227 11/06/21 0400 11/07/21 0257  PROT 4.5*  --   --   ALBUMIN 1.8* 1.9* 1.9*  AST 20  --   --   ALT 36  --   --   ALKPHOS 90  --   --   BILITOT 0.9  --   --         Sedimentation Rate No results for input(s): ESRSEDRATE in the last 72 hours. C-Reactive Protein No results for input(s): CRP in the last 72 hours.  Micro Results: Recent Results (from the past 720 hour(s))  Blood Culture (routine x 2)     Status: None   Collection Time: 10/28/21  6:20 PM   Specimen: BLOOD LEFT HAND  Result Value Ref Range Status   Specimen Description BLOOD LEFT HAND  Final   Special Requests   Final    BOTTLES DRAWN AEROBIC ONLY Blood Culture adequate volume   Culture   Final    NO GROWTH 5 DAYS Performed at [facility], [address], [place], [place] [address]    Report Status 11/02/2021 FINAL  Final  Blood Culture (routine x 2)     Status: None   Collection Time: 10/28/21  6:46 PM   Specimen: BLOOD LEFT ARM  Result Value Ref Range Status   Specimen Description BLOOD LEFT ARM  Final   Special Requests   Final    BOTTLES DRAWN AEROBIC AND ANAEROBIC Blood Culture adequate volume   Culture   Final    NO GROWTH 5 DAYS Performed at [facility], [place] [address], [place], [place] [address]    Report Status 11/02/2021 FINAL  Final  Resp Panel by RT-PCR (Flu A&B, Covid) Nasopharyngeal Swab     Status: None   Collection Time: 10/28/21  6:48 PM   Specimen: Nasopharyngeal Swab; Nasopharyngeal(NP) swabs in vial transport medium  Result Value Ref Range Status  SARS Coronavirus 2 by RT PCR NEGATIVE NEGATIVE Final    Comment: (NOTE) SARS-CoV-2 target nucleic acids are NOT DETECTED.  The SARS-CoV-2 RNA is generally  detectable in upper respiratory specimens during the acute phase of infection. The lowest concentration of SARS-CoV-2 viral copies this assay can detect is 138 copies/mL. A negative result does not preclude SARS-Cov-2 infection and should not be used as the sole basis for treatment or other patient management decisions. A negative result may occur with  improper specimen collection/handling, submission of specimen other than nasopharyngeal swab, presence of viral mutation(s) within the areas targeted by this assay, and inadequate number of viral copies(<138 copies/mL). A negative result must be combined with clinical observations, patient history, and epidemiological information. The expected result is Negative.  Fact Sheet for Patients:  EntrepreneurPulse.com.au  Fact Sheet for Healthcare Providers:  IncredibleEmployment.be  This test is no t yet approved or cleared by the [place] FDA and  has been authorized for detection and/or diagnosis of SARS-CoV-2 by FDA under an Emergency Use Authorization (EUA). This EUA will remain  in effect (meaning this test can be used) for the duration of the COVID-19 declaration under Section 564(b)(1) of the Act, 21 U.S.C.section 360bbb-3(b)(1), unless the authorization is terminated  or revoked sooner.       Influenza A by PCR NEGATIVE NEGATIVE Final   Influenza B by PCR NEGATIVE NEGATIVE Final    Comment: (NOTE) The Xpert Xpress SARS-CoV-2/FLU/RSV plus assay is intended as an aid in the diagnosis of influenza from Nasopharyngeal swab specimens and should not be used as a sole basis for treatment. Nasal washings and aspirates are unacceptable for Xpert Xpress SARS-CoV-2/FLU/RSV testing.  Fact Sheet for Patients: EntrepreneurPulse.com.au  Fact Sheet for Healthcare Providers: IncredibleEmployment.be  This test is not yet approved or cleared by the [place] FDA  and has been authorized for detection and/or diagnosis of SARS-CoV-2 by FDA under an Emergency Use Authorization (EUA). This EUA will remain in effect (meaning this test can be used) for the duration of the COVID-19 declaration under Section 564(b)(1) of the Act, 21 U.S.C. section 360bbb-3(b)(1), unless the authorization is terminated or revoked.  Performed at [facility], [place] [address], [place], [place] [address]   Urine Culture     Status: None   Collection Time: 10/28/21  8:51 PM   Specimen: In/Out Cath Urine  Result Value Ref Range Status   Specimen Description IN/OUT CATH URINE  Final   Special Requests NONE  Final   Culture   Final    NO GROWTH Performed at [facility], [place] [address] [place], [place] [address]    Report Status 10/29/2021 FINAL  Final  MRSA Next Gen by PCR, Nasal     Status: None   Collection Time: 10/29/21  8:51 AM   Specimen: Nasal Mucosa; Nasal Swab  Result Value Ref Range Status   MRSA by PCR Next Gen NOT DETECTED NOT DETECTED Final    Comment: (NOTE) The GeneXpert MRSA Assay (FDA approved for NASAL specimens only), is one component of a comprehensive MRSA colonization surveillance program. It is not intended to diagnose MRSA infection nor to guide or monitor treatment for MRSA infections. Test performance is not FDA approved in patients less than 44 years old. Performed at [facility], [place] [address] [place], [place] [address]   Culture, blood (routine x 2)     Status: None (Preliminary result)   Collection Time: 11/03/21  7:58 AM  Specimen: BLOOD  Result Value Ref Range Status   Specimen Description BLOOD LEFT ANTECUBITAL  Final   Special Requests   Final    BOTTLES DRAWN AEROBIC ONLY Blood Culture results may not be optimal due to an inadequate volume of blood received in culture bottles   Culture   Final    NO GROWTH 4 DAYS Performed at [facility], [place] [address], [place], [place] [address]    Report Status  PENDING  Incomplete  Culture, blood (routine x 2)     Status: None (Preliminary result)   Collection Time: 11/03/21  7:58 AM   Specimen: BLOOD LEFT HAND  Result Value Ref Range Status   Specimen Description BLOOD LEFT HAND  Final   Special Requests   Final    BOTTLES DRAWN AEROBIC AND ANAEROBIC Blood Culture results may not be optimal due to an inadequate volume of blood received in culture bottles   Culture   Final    NO GROWTH 4 DAYS Performed at [facility], [place] [address] [place], [place] [address]    Report Status PENDING  Incomplete    Studies/Results: No results found.    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 male Spanish-speaking man with history of stroke and left bundle branch block diabetes mellitus chronic kidney disease who had presented with confusion and labored breathing.  He had been found to be profoundly anemic and the need of blood transfusion.  He also had acute kidney injury.  He also had apparent acute onset thrombocytopenia with platelets being 101  versus 404 in January 2021 which of the last lab we have in our system.  His initial CBC had shown also 600 immature  granulocytes with peripheral smear showing marked left shift with metal sites myelocytes and occasional blasts seen  Patient had imaging in the ER and blood cultures were taken CT of the abdomen pelvis and CT chest were performed with CT chest and CT abdomen pelvis is suggested infiltrate in the left upper lobe and left lower lobe.  Patient had been on very broad-spectrum antibiotics in the form of vancomycin metronidazole and cefepime followed by vancomycin and cefepime then Unasyn and doxycycline.  I believed now the doxycycline was  likely added to cover potential [facility] spotted fever, Ehrlichia  Despite being on antibiotics he then became febrile to 102.3 degrees on November 02, 2021.  When I saw him initially he was fairly confused and there is a question of whether he might have some underlying cognitive deficit.  The main complaints that he voiced to me and that the family also mentioned were his bilateral  knee calf and ankle pain.  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  However gout would certainly not explain his worsening thrombocytopenia and now leukopenia over the last several days.  I am concerned that we are missing something as far as the cause for all of his constellation of symptoms.  A tickborne infection with Ehrlichia or [place] spotted fever would make some sense to me but his failure to show much improvement with doxycycline would argue against this.   I ordered an MRI  which I reviewed and which does not show evidence of any infectious pathology in his left knee but significant osteoarthritis and a trace effusion  CT of the chest shows stable bilateral pleural effusions as well as areas of atelectasis infiltrate but of decreased in severity since prior study  I really think he needs a bone marrow biopsy at this point with specimen sent for pathology as well as AFB and fungal cultures.  Apparently he  did have thrombocytopenia and leukopenia at [facility] in October 2022 but the last time he had labs in 2021 he actually had thrombophilia and a normal white count.  Recently that visit on September 23, 2021 was for constellation of symptoms of subjective fever abdominal pain nausea emesis diarrhea cough chest pain and myalgias.  I continue to suspect he has a more subacute process such as potentially a malignancy or disseminated tuberculosis or fungal infection rather than typical bacterial infection he has had abnormal cell lines now more than a month but prior that did not as far as I can tell.  He also seems to have a waxing and waning clinical course that does not seem to correlate with any specific therapy.  His son also told me today that the patient had become ill while visiting [place] several months ago and has never returned to normal.  HIV fourth-generation assay was negative.  RPR was negative  QuantiFERON gold test was negative serum cryptococcal antigen negative urine histoplasma antigen urine Blastomyces antigen and serum Coccidioides antibody pending  Hematology Oncology have also seen the patient in consultation and ordered multiple myeloma panel.  LDH remains elevated  We will also order EBV and CMV serologies  IR guided bone marrow biopsy is planned for tomorrow morning  Hypotension: He responded to fluids and work-up is being undertaken I  I have recommended blood cultures to be obtained  I would prefer to hold off on empiric antibiotics for bacterial process but not opposed to them altogether, certainly if he deteriorates further. I DO think it is critical he have bone marrow biopsy tomorrow.  I spent 10mnutes with the patient including face to face counseling of the patient and his surrogate his son with [employer] Spanish translator,  personally reviewing radiographic data from last 48 hours CBC CMP along with medical records before and during the visit and in  coordination of his care with Dr. RTyrell Antonio    LOS: 10 days   CAlcide Evener11/30/2022, 2:28 PM

## 2021-11-07 NOTE — Plan of Care (Signed)
  Problem: Education: Goal: Knowledge of General Education information will improve Description: Including pain rating scale, medication(s)/side effects and non-pharmacologic comfort measures Outcome: Progressing   Problem: Clinical Measurements: Goal: Respiratory complications will improve Outcome: Progressing   Problem: Clinical Measurements: Goal: Cardiovascular complication will be avoided Outcome: Progressing   Problem: Nutrition: Goal: Adequate nutrition will be maintained Outcome: Progressing   Problem: Coping: Goal: Level of anxiety will decrease Outcome: Progressing   Problem: Safety: Goal: Ability to remain free from injury will improve Outcome: Progressing   Problem: Skin Integrity: Goal: Risk for impaired skin integrity will decrease Outcome: Progressing   Problem: Fluid Volume: Goal: Will show no signs and symptoms of excessive bleeding Outcome: Progressing   Problem: Clinical Measurements: Goal: Complications related to the disease process, condition or treatment will be avoided or minimized Outcome: Progressing

## 2021-11-07 NOTE — Significant Event (Addendum)
Rapid Response Event Note   Reason for Call :  Hypotension  Initial Focused Assessment:  Pt lying in chair. He had been in the chair for some time when he started complaining of feeling like he was going to "pass out" and asked the RN to assist him back to bed. RN checked his BP while in the chair and it was 54/39. Pt laid back in chair and fully reclined. IVF bolus initiated and BP responded to fluids. Additional IV access achieved, MD ordering type and screen and 1U PRBCs.   Pt remained responsive during the entirety of his hypotensive episode. Skin warm, dry. Peripheral pulses 1-2+. Pt in no distress.   VS: T 98.15F, BP 81/58, HR 71, RR 16, SpO2 98% on room air CBG: 141  Interventions:  -PIV x2 -1L IVF bolus  Plan of Care:  -Increase frequency of VS checks -Up with two person assistance -Update provider regarding studies ordered  Call rapid response for additional needs  Event Summary:  MD Notified: Dr. Tyrell Antonio Call Time: [place] Time: 1300 End Time: [place], RN

## 2021-11-07 NOTE — TOC Progression Note (Signed)
Transition of Care [facility]) - Progression Note    Patient Details  Name: Khaleed Holan MRN: [id number] Date of Birth: 1941/08/25  [facility] of [facility] CM/SW Contact  Cyndi Bender, RN Phone Number: 11/07/2021, 12:49 PM  Clinical Narrative:    Used interpretor to speak to [facility] and patient.Patient lives with his son and wants to go home with [facility]. He has walker, cane and wheelchair.Spoke to Rochelle with [employer] and ordered hospital bed. HH-PT/OT/RN arranged with Alvis Lemmings.  Patient states his daughter can transport him to appointments. Address, Phone number and PCP verified.   Expected Discharge Plan: [place] Barriers to Discharge: Continued Medical Work up  Expected Discharge Plan and Services Expected Discharge Plan: [place]   Discharge Planning Services: CM Consult Post Acute Care Choice: Durable Medical Equipment, Home Health Living arrangements for the past 2 months: Single Family Home                 DME Arranged: Hospital bed DME Agency: [employer] Date DME Agency Contacted: 11/07/21 Time DME Agency Contacted: 2060 Representative spoke with at DME Agency: [place]: RN, PT, OT [place] Agency: [place] Date [place]: 11/02/21 Time [place]: 65 Representative spoke with at [place]: [place] ([place]) Interventions    Readmission Risk Interventions No flowsheet data found.

## 2021-11-07 NOTE — Progress Notes (Signed)
Family hit nurses call bell to alert staff that patient didn't "feel well." Upon assessment, patient stated he felt like he was going to pass out. BP obtained and was 54/39. Dr. Tyrell Antonio and rapid response RN notified and came to bedside to assess patient. IV fluid bolus immediately started. EKG, blood glucose and labs obtained. BP stable following fluid bolus. Will continue to monitor pt.

## 2021-11-07 NOTE — Progress Notes (Signed)
Physical Therapy Treatment Patient Details Name: Eugene Cooper MRN: [id number] DOB: Jul 20, 1941 Today's Date: 11/07/2021   History of Present Illness 80 yo admitted 11/20 with AMS, weakness, tachycardia, hyperkalemia and symptomatic anemia. EGD 11/21. PMhx: DM, CVA, HTN, HLD, LBBB, gout    PT Comments    Patient again limited in mobility due to bil LE pain and weakness. Was however, able to pivot from bed to chair with RW and assist of PT and his son. Family still plans to take pt home and patient reports his children will stay with him "all the time." Equipment recommendation updated to include hospital bed.     Recommendations for follow up therapy are one component of a multi-disciplinary discharge planning process, led by the attending physician.  Recommendations may be updated based on patient status, additional functional criteria and insurance authorization.  Follow Up Recommendations  Skilled nursing-short term rehab (<3 hours/day) (family refusing SNF; will need HHPT)     Assistance Recommended at Discharge Frequent or constant [facility] bed    Recommendations for Other Services       Precautions / Restrictions Precautions Precautions: Fall Precaution Comments: bil knee pain, gout Restrictions Weight Bearing Restrictions: No     Mobility  Bed Mobility Overal bed mobility: Needs Assistance Bed Mobility: Rolling;Sidelying to Sit Rolling: Modified independent (Device/Increase time) (with rail) Sidelying to sit: Min assist;HOB elevated       General bed mobility comments: Assist to elevate trunk. HOB elevated. Able to advance BLE from bed surface to EOB without external assist.    Transfers Overall transfer level: Needs assistance Equipment used: Rolling walker (2 wheels) Transfers: Sit to/from Stand;Bed to chair/wheelchair/BSC Sit to Stand: Max assist     Step pivot transfers: Mod assist;+2 physical  assistance     General transfer comment: Max A for sit to stand from EOB. Took 1-2 pivotal steps with RW and began to sit prematurely (before fully in front of chair)    Ambulation/Gait               General Gait Details: unable to progress to gait due to pt's LE pain and weakness   Stairs             Wheelchair Mobility    Modified Rankin (Stroke Patients Only)       Balance Overall balance assessment: Needs assistance;History of Falls Sitting-balance support: Feet supported Sitting balance-Leahy Scale: Fair     Standing balance support: Bilateral upper extremity supported;Reliant on assistive device for balance Standing balance-Leahy Scale: Poor                              Cognition Arousal/Alertness: Awake/alert Behavior During Therapy: Flat affect Overall Cognitive Status: Impaired/Different from baseline Area of Impairment: Following commands;Safety/judgement;Attention                 Orientation Level:  (NT) Current Attention Level: Selective (able to attend to interpreter and PT and son)   Following Commands: Follows one step commands with increased time     Problem Solving: Slow processing;Requires verbal cues;Requires tactile cues          Exercises General Exercises - Lower Extremity Long Arc Quad: AROM;Both;10 reps Hip Flexion/Marching: AROM;Both;10 reps    General Comments General comments (skin integrity, edema, etc.): son present. Utilized Chief Operating Officer, [employer]ifton James.      Pertinent Vitals/Pain Pain Assessment: Faces Faces Pain Scale: Hurts little more Pain  Location: bilat knees Pain Descriptors / Indicators: Sore;Discomfort Pain Intervention(s): Limited activity within patient's tolerance;Monitored during session    Home Living                          Prior Function            PT Goals (current goals can now be found in the care plan section) Acute Rehab PT Goals Patient Stated Goal: be  able to walk and return home Time For Goal Achievement: 11/13/21 Potential to Achieve Goals: Fair Progress towards PT goals: Progressing toward goals    Frequency    Min 3X/week      PT Plan Current plan remains appropriate    Co-evaluation              AM-PAC PT "6 Clicks" Mobility   Outcome Measure  Help needed turning from your back to your side while in a flat bed without using bedrails?: A Little Help needed moving from lying on your back to sitting on the side of a flat bed without using bedrails?: A Little Help needed moving to and from a bed to a chair (including a wheelchair)?: Total Help needed standing up from a chair using your arms (e.g., wheelchair or bedside chair)?: Total Help needed to walk in hospital room?: Total Help needed climbing 3-5 steps with a railing? : Total 6 Click Score: 10    End of Session Equipment Utilized During Treatment: Gait belt Activity Tolerance: Patient limited by pain;Treatment limited secondary to medical complications (Comment) (weakness) Patient left: with call bell/phone within reach;in chair;with chair alarm set;with family/visitor present Nurse Communication: Mobility status PT Visit Diagnosis: Other abnormalities of gait and mobility (R26.89);Difficulty in walking, not elsewhere classified (R26.2);Muscle weakness (generalized) (M62.81);History of falling (Z91.81)     Time: 5852-7782 PT Time Calculation (min) (ACUTE ONLY): 23 min  Charges:  $Gait Training: 8-22 mins $Therapeutic Exercise: 8-22 mins                      Arby Barrette, PT Acute Rehabilitation Services  Pager [phone] Office [phone]    Rexanne Mano 11/07/2021, 12:19 PM

## 2021-11-08 ENCOUNTER — Inpatient Hospital Stay (HOSPITAL_COMMUNITY): Payer: Medicare Other

## 2021-11-08 ENCOUNTER — Other Ambulatory Visit (HOSPITAL_COMMUNITY): Payer: Self-pay

## 2021-11-08 DIAGNOSIS — D62 Acute posthemorrhagic anemia: Secondary | ICD-10-CM

## 2021-11-08 DIAGNOSIS — K319 Disease of stomach and duodenum, unspecified: Secondary | ICD-10-CM | POA: Diagnosis not present

## 2021-11-08 DIAGNOSIS — I471 Supraventricular tachycardia: Secondary | ICD-10-CM | POA: Diagnosis not present

## 2021-11-08 DIAGNOSIS — E1122 Type 2 diabetes mellitus with diabetic chronic kidney disease: Secondary | ICD-10-CM | POA: Diagnosis not present

## 2021-11-08 DIAGNOSIS — E8729 Other acidosis: Secondary | ICD-10-CM

## 2021-11-08 DIAGNOSIS — E872 Acidosis, unspecified: Secondary | ICD-10-CM | POA: Diagnosis not present

## 2021-11-08 DIAGNOSIS — E1165 Type 2 diabetes mellitus with hyperglycemia: Secondary | ICD-10-CM | POA: Diagnosis not present

## 2021-11-08 DIAGNOSIS — Z8673 Personal history of transient ischemic attack (TIA), and cerebral infarction without residual deficits: Secondary | ICD-10-CM | POA: Diagnosis not present

## 2021-11-08 DIAGNOSIS — J189 Pneumonia, unspecified organism: Secondary | ICD-10-CM

## 2021-11-08 DIAGNOSIS — E871 Hypo-osmolality and hyponatremia: Secondary | ICD-10-CM | POA: Diagnosis not present

## 2021-11-08 DIAGNOSIS — R509 Fever, unspecified: Secondary | ICD-10-CM | POA: Diagnosis not present

## 2021-11-08 DIAGNOSIS — A419 Sepsis, unspecified organism: Secondary | ICD-10-CM | POA: Diagnosis not present

## 2021-11-08 DIAGNOSIS — G9341 Metabolic encephalopathy: Secondary | ICD-10-CM | POA: Diagnosis not present

## 2021-11-08 DIAGNOSIS — D61818 Other pancytopenia: Secondary | ICD-10-CM | POA: Diagnosis not present

## 2021-11-08 DIAGNOSIS — D469 Myelodysplastic syndrome, unspecified: Secondary | ICD-10-CM | POA: Diagnosis not present

## 2021-11-08 DIAGNOSIS — K2971 Gastritis, unspecified, with bleeding: Secondary | ICD-10-CM | POA: Diagnosis not present

## 2021-11-08 DIAGNOSIS — E440 Moderate protein-calorie malnutrition: Secondary | ICD-10-CM | POA: Diagnosis not present

## 2021-11-08 DIAGNOSIS — Z20822 Contact with and (suspected) exposure to covid-19: Secondary | ICD-10-CM | POA: Diagnosis not present

## 2021-11-08 DIAGNOSIS — D709 Neutropenia, unspecified: Secondary | ICD-10-CM | POA: Diagnosis not present

## 2021-11-08 DIAGNOSIS — M1712 Unilateral primary osteoarthritis, left knee: Secondary | ICD-10-CM | POA: Diagnosis not present

## 2021-11-08 DIAGNOSIS — N1831 Chronic kidney disease, stage 3a: Secondary | ICD-10-CM | POA: Diagnosis not present

## 2021-11-08 DIAGNOSIS — R6521 Severe sepsis with septic shock: Secondary | ICD-10-CM | POA: Diagnosis not present

## 2021-11-08 DIAGNOSIS — X58XXXA Exposure to other specified factors, initial encounter: Secondary | ICD-10-CM | POA: Diagnosis present

## 2021-11-08 DIAGNOSIS — I129 Hypertensive chronic kidney disease with stage 1 through stage 4 chronic kidney disease, or unspecified chronic kidney disease: Secondary | ICD-10-CM | POA: Diagnosis not present

## 2021-11-08 DIAGNOSIS — M109 Gout, unspecified: Secondary | ICD-10-CM | POA: Diagnosis not present

## 2021-11-08 DIAGNOSIS — N179 Acute kidney failure, unspecified: Secondary | ICD-10-CM | POA: Diagnosis not present

## 2021-11-08 LAB — EPSTEIN-BARR VIRUS (EBV) ANTIBODY PROFILE
EBV NA IgG: 49.7 U/mL — ABNORMAL HIGH (ref 0.0–17.9)
EBV VCA IgG: 73.3 U/mL — ABNORMAL HIGH (ref 0.0–17.9)
EBV VCA IgM: <36 U/mL (ref 0.0–35.9)

## 2021-11-08 LAB — BASIC METABOLIC PANEL
Anion gap: 8 (ref 5–15)
BUN: 6 mg/dL — ABNORMAL LOW (ref 8–23)
CO2: 20 mmol/L — ABNORMAL LOW (ref 22–32)
Calcium: 7.8 mg/dL — ABNORMAL LOW (ref 8.9–10.3)
Chloride: 111 mmol/L (ref 98–111)
Creatinine, Ser: 0.81 mg/dL (ref 0.61–1.24)
GFR, Estimated: >60 mL/min (ref >60)
Glucose, Bld: 101 mg/dL — ABNORMAL HIGH (ref 70–99)
Potassium: 3.6 mmol/L (ref 3.5–5.1)
Sodium: 139 mmol/L (ref 135–145)

## 2021-11-08 LAB — CBC WITH DIFFERENTIAL/PLATELET
Abs Immature Granulocytes: 0.57 10*3/uL — ABNORMAL HIGH (ref 0.00–0.07)
Basophils Absolute: 0 10*3/uL (ref 0.0–0.1)
Basophils Relative: 1 %
Eosinophils Absolute: 0 10*3/uL (ref 0.0–0.5)
Eosinophils Relative: 0 %
HCT: 21.2 % — ABNORMAL LOW (ref 39.0–52.0)
Hemoglobin: 7.3 g/dL — ABNORMAL LOW (ref 13.0–17.0)
Immature Granulocytes: 22 %
Lymphocytes Relative: 34 %
Lymphs Abs: 0.9 10*3/uL (ref 0.7–4.0)
MCH: 31.2 pg (ref 26.0–34.0)
MCHC: 34.4 g/dL (ref 30.0–36.0)
MCV: 90.6 fL (ref 80.0–100.0)
Monocytes Absolute: 0.4 10*3/uL (ref 0.1–1.0)
Monocytes Relative: 15 %
Neutro Abs: 0.7 10*3/uL — ABNORMAL LOW (ref 1.7–7.7)
Neutrophils Relative %: 28 %
Platelets: 69 10*3/uL — ABNORMAL LOW (ref 150–400)
RBC: 2.34 MIL/uL — ABNORMAL LOW (ref 4.22–5.81)
RDW: 15 % (ref 11.5–15.5)
WBC: 2.7 10*3/uL — ABNORMAL LOW (ref 4.0–10.5)
nRBC: 0 % (ref 0.0–0.2)

## 2021-11-08 LAB — BLASTOMYCES ANTIGEN: Blastomyces Antigen: NOT DETECTED ng/mL

## 2021-11-08 LAB — CULTURE, BLOOD (ROUTINE X 2)
Culture: NO GROWTH
Culture: NO GROWTH

## 2021-11-08 LAB — PREPARE RBC (CROSSMATCH)

## 2021-11-08 LAB — GLUCOSE, CAPILLARY
Glucose-Capillary: 96 mg/dL (ref 70–99)
Glucose-Capillary: 127 mg/dL — ABNORMAL HIGH (ref 70–99)
Glucose-Capillary: 76 mg/dL (ref 70–99)
Glucose-Capillary: 129 mg/dL — ABNORMAL HIGH (ref 70–99)

## 2021-11-08 LAB — PROCALCITONIN: Procalcitonin: <0.1 ng/mL

## 2021-11-08 MED ORDER — MIDAZOLAM HCL 2 MG/2ML IJ SOLN
INTRAMUSCULAR | Status: AC
  Filled 2021-11-08: qty 2

## 2021-11-08 MED ORDER — LIDOCAINE HCL 1 % IJ SOLN
INTRAMUSCULAR | Status: AC
  Filled 2021-11-08: qty 10

## 2021-11-08 MED ORDER — ACETAMINOPHEN 500 MG PO TABS
500.0000 mg | ORAL_TABLET | Freq: Two times a day (BID) | ORAL | Status: DC
  Administered 2021-11-08 – 2021-11-10 (×5): 500 mg via ORAL
  Filled 2021-11-08 (×5): qty 1

## 2021-11-08 MED ORDER — MIDAZOLAM HCL 2 MG/2ML IJ SOLN
INTRAMUSCULAR | Status: AC | PRN
  Administered 2021-11-08: 1 mg via INTRAVENOUS
  Administered 2021-11-08: .5 mg via INTRAVENOUS

## 2021-11-08 MED ORDER — COLCHICINE 0.6 MG PO TABS
0.6000 mg | ORAL_TABLET | Freq: Every day | ORAL | Status: DC
  Administered 2021-11-09 – 2021-11-10 (×2): 0.6 mg via ORAL
  Filled 2021-11-08 (×2): qty 1

## 2021-11-08 MED ORDER — SODIUM CHLORIDE 0.9% IV SOLUTION: Freq: Once | INTRAVENOUS | Status: AC

## 2021-11-08 MED ORDER — FENTANYL CITRATE (PF) 100 MCG/2ML IJ SOLN
INTRAMUSCULAR | Status: AC
  Filled 2021-11-08: qty 2

## 2021-11-08 MED ORDER — FENTANYL CITRATE (PF) 100 MCG/2ML IJ SOLN
INTRAMUSCULAR | Status: AC | PRN
  Administered 2021-11-08: 25 ug via INTRAVENOUS
  Administered 2021-11-08: 50 ug via INTRAVENOUS

## 2021-11-08 MED ORDER — LACTATED RINGERS IV SOLN: INTRAVENOUS | Status: DC

## 2021-11-08 NOTE — Progress Notes (Signed)
Occupational Therapy Treatment Patient Details Name: Eugene Cooper MRN: [id number] DOB: 07-Sep-1941 Today's Date: 11/08/2021   History of present illness 80 yo admitted 11/20 with AMS, weakness, tachycardia, hyperkalemia and symptomatic anemia. EGD 11/21. PMhx: DM, CVA, HTN, HLD, LBBB, gout   OT comments  Patient met after bone marrow biopsy somewhat lethargic but alert (improved with increased mobility). Son visiting from out of town present at bedside. Son declined education on functional transfers and ADLs as he will not be responsible for these tasks at time of d/c. Patient in agreement with OT treatment session with focus on functional transfers, standing tolerance, and OOB activity. Patient progressed from EOB to recliner and tolerated standing x2 trials for 30sec to 1 min with use of RW. Patient continues to require Mod A overall for ADLs and would benefit from continued acute OT services in prep for safe d/c home as family is declining SNF rehab.    Recommendations for follow up therapy are one component of a multi-disciplinary discharge planning process, led by the attending physician.  Recommendations may be updated based on patient status, additional functional criteria and insurance authorization.    Follow Up Recommendations  Skilled nursing-short term rehab (<3 hours/day) (Family declining SNF; plan for home with [facility])    Assistance Recommended at Discharge Frequent or constant Supervision/Assistance  Equipment Recommendations  BSC/3in1    Recommendations for Other Services      Precautions / Restrictions Precautions Precautions: Fall Precaution Comments: bil knee pain, gout Restrictions Weight Bearing Restrictions: No       Mobility Bed Mobility Overal bed mobility: Needs Assistance Bed Mobility: Rolling;Sidelying to Sit Rolling: Modified independent (Device/Increase time) (with rail) Sidelying to sit: Min assist;HOB elevated       General bed mobility  comments: Assist to elevate trunk. HOB elevated. Able to advance BLE from bed surface to EOB without external assist.    Transfers Overall transfer level: Needs assistance Equipment used: Rolling walker (2 wheels) Transfers: Sit to/from Stand;Bed to chair/wheelchair/BSC Sit to Stand: Mod assist   Step pivot transfers: Mod assist       General transfer comment: Mod A for sit to stand from EOB and 2 additional trials from recliner with cues for hand placement. Cues for upright posture.     Balance Overall balance assessment: Needs assistance;History of Falls Sitting-balance support: Feet supported Sitting balance-Leahy Scale: Fair     Standing balance support: Bilateral upper extremity supported;Reliant on assistive device for balance Standing balance-Leahy Scale: Poor Standing balance comment: Improved since inital eval; cues for upright posture.                           ADL either performed or assessed with clinical judgement   ADL Overall ADL's : Needs assistance/impaired Eating/Feeding: Set up;Sitting   Grooming: Minimal assistance;Sitting   Upper Body Bathing: Moderate assistance;Sitting   Lower Body Bathing: Moderate assistance;Sit to/from stand   Upper Body Dressing : Minimal assistance;Sitting   Lower Body Dressing: Moderate assistance;Sit to/from stand   Toilet Transfer: Moderate assistance;Rolling walker (2 wheels);Stand-pivot             General ADL Comments: Grossly Mod A with multimodal cues for sequencing/safety.    Extremity/Trunk Assessment              Vision       Perception     Praxis      Cognition Arousal/Alertness: Awake/alert;Lethargic (Lethargic initially after bone marrow biopsy) Behavior During Therapy:  Flat affect Overall Cognitive Status: Impaired/Different from baseline Area of Impairment: Following commands;Safety/judgement;Attention                 Orientation Level: Place;Time;Situation Current  Attention Level: Selective   Following Commands: Follows one step commands with increased time     Problem Solving: Slow processing;Requires verbal cues;Requires tactile cues General Comments: Disoriented to time and situation. Follows 1-step verbal commands with increased accuracy this date.          Exercises Exercises: Other exercises Other Exercises Other Exercises: Standing trials x3; patient able to stand for 30sec -58mn each trial.   Shoulder Instructions       General Comments Son from out of town present at bedside. Declined education on functional transfers and assiting with ADLs as he will not be responsible for these tasks.    Pertinent Vitals/ Pain       Pain Assessment: 0-10 Pain Score: 6  Pain Location: bilat knees Pain Descriptors / Indicators: Sore;Discomfort Pain Intervention(s): Limited activity within patient's tolerance;Monitored during session;Repositioned  Home Living                                          Prior Functioning/Environment              Frequency  Min 2X/week        Progress Toward Goals  OT Goals(current goals can now be found in the care plan section)  Progress towards OT goals: Progressing toward goals  Acute Rehab OT Goals Patient Stated Goal: Per son; for patient to return home at time of d/c OT Goal Formulation: With family Time For Goal Achievement: 11/13/21 Potential to Achieve Goals: Fair ADL Goals Pt Will Perform Eating: with set-up;sitting Pt Will Perform Grooming: with set-up;sitting Pt Will Perform Upper Body Bathing: with supervision;sitting Pt Will Perform Upper Body Dressing: with set-up;sitting Pt Will Transfer to Toilet: with min assist;stand pivot transfer;bedside commode Pt Will Perform Toileting - Clothing Manipulation and hygiene: with min assist;sit to/from stand Pt/caregiver will Perform Home Exercise Program: Increased strength;Both right and left upper extremity;With  Supervision Additional ADL Goal #1: Patient will score <4/28 on SBT indicating improved cognition in prep for ADLs.  Plan Discharge plan remains appropriate;Frequency remains appropriate    Co-evaluation                 AM-PAC OT "6 Clicks" Daily Activity     Outcome Measure   Help from another person eating meals?: A Little Help from another person taking care of personal grooming?: A Little Help from another person toileting, which includes using toliet, bedpan, or urinal?: A Lot Help from another person bathing (including washing, rinsing, drying)?: A Lot Help from another person to put on and taking off regular upper body clothing?: A Little Help from another person to put on and taking off regular lower body clothing?: A Lot 6 Click Score: 15    End of Session Equipment Utilized During Treatment: Gait belt;Rolling walker (2 wheels)  OT Visit Diagnosis: Unsteadiness on feet (R26.81);Other abnormalities of gait and mobility (R26.89);Muscle weakness (generalized) (M62.81);Pain   Activity Tolerance Patient tolerated treatment well   Patient Left in chair;with call bell/phone within reach;with chair alarm set   Nurse Communication Mobility status        Time: 01914-7829OT Time Calculation (min): 26 min  Charges: OT General Charges $OT Visit: 1 Visit  OT Treatments $Self Care/Home Management : 23-37 mins  Reann Dobias H. OTR/L Supplemental OT, Department of rehab services [phone]  Marwin Primmer R H. 11/08/2021, 10:52 AM

## 2021-11-08 NOTE — Progress Notes (Signed)
PROGRESS NOTE    Eugene Cooper  YEB:[id number] DOB: 01/18/1941 DOA: 10/28/2021 PCP: Emelia Loron, NP   Brief Narrative: 80 year old Spanish-speaking male with past medical history significant for CVA, LBBB, diabetes type 2, CKD stage IIIa, HLD, ambulatory dysfunction who presents with altered mental status, poor oral intake, bilateral lower extremity pain, admitted for acute blood loss anemia, sinus tachycardia, AKI, hyperkalemia and severe sepsis secondary to left lower lobe pneumonia.  Initially there was concern about a fib  which was ruled out by cardiology.  Hemoglobin on admission 6.4, occult blood positive, EGD revealed gastritis.  Patient creatinine 2.5, potassium 6.2.  Chest x-ray right suprahilar opacity.  CT head negative.  CT abdomen pelvis with concern for fluid overload pulm consolidation.  CT chest with patchy consolidation within the left lower lobe and left upper lobe nodule.  Patient was transfused 1 unit of packed red blood cell with appropriate response.  Hemoglobin had remained stable.  He was initially on vancomycin and cefepime but subsequently antibiotics de-escalated to IV Unasyn and doxycycline.  He was also found to have acute metabolic encephalopathy, confusion.  He is spiking fever 102, tachycardia hypotension and leukopenia if 11/25.  ID consulted and recommended MRI of the right knee, repeat CT chest, hematology consult and bone marrow biopsy.  MRI and repeated CT chest unrevealing.  Bone marrow biopsy schedule for 12/1.  11/30; hypotensive systolic blood pressure in the 50--responded to IV fluids.  Last blood pressure 100 range.     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 CT chest concerning for left upper lobe and left lower lobe consolidation. MRI right knee with arthritic changes and quadricep tendon injury. Last fever 11/25. HIV and RPR unrevealing.  Blood cultures no growth.   2-Fever unknown etiology: -Spiked fever 11/25 -Completed treatment for pneumonia. -ID recommended bone marrow biopsy -Completed antibiotics. -QuantiFERON for TB negative.  Cryptococcal antigen negative.  Blastomycosis pending, histoplasma antigen pending.  3-Hypotension: -Recurrent hypotension 11/30. -TTE on admission normal left ventricular ejection fraction -Patient became hypotensive 11/30.  Systolic blood pressure dropped to the 50s.  He was symptomatic reported lightheadedness. -Blood pressure improved with 1 L of IV fluids. -Repeating CBC, 1 unit of packed red blood cells ordered. -cortisol level 17, not low. Lactic acid normal. Troponin normal.  Suspect related to dehydration, repeated hb in the afternoon with hemoconcentration. BP improved with IV fluids. BP better today.   4-ABL/IDA/GI bleed/erosive gastropathy/gastritis - EGD showed erosive gastropathy and gastritis -Received 1 unit of packed red blood cell. -He will need colonoscopy when able to drink prep -Oral Protonix twice daily for 8 weeks.  5-AKI on CKD stage IIIa: Secondary to sepsis, diuretic. Resolved with IV fluids.  6-Acute metabolic encephalopathy/delirium History of  CVA CT head, B12 ammonia and RPR unrevealing. Mental status  improving  7-Diabetes type 2 uncontrolled hyperglycemia: Hemoglobin A1c 7.5 -Continue with a sliding scale insulin -Continue with basal insulin 5 units daily  8-Pancytopenia -Hematology oncology and ID following -MM panel pending -Underwent bone marrow biopsy 12/1  9-7 mm LUL nodule: He will need repeat CT chest in 6-12.  Hyperkalemia: Resolved Hypomagnesemia:  Replaced Left knee pain/tenderness and stiffness: MRI with severe arthritis changes standing intermediate grade partial width tearing of the central and lateral fibers of the distal quadriceps tendon at its insertion on the patella. Pain improving.  Continue with supportive care. History of gout: Continue with colchicine Schedule tylenol for pain.    Nutrition Problem: Increased nutrient needs Etiology: acute illness    Signs/Symptoms: estimated needs    Interventions: Ensure Enlive (each supplement provides 350kcal and 20 grams of protein)  Estimated body mass index is 25.65 kg/m as calculated from the following:   Height as of this encounter: 5' 2"  (1.575 m).   Weight as of this encounter: 63.6 kg.   DVT prophylaxis: SCDs Code Status: Full code Family Communication: Discussed with son who was at bedside Disposition Plan:  Status is: Inpatient  Remains inpatient appropriate because: Patient with altered mental status, hypotension, pancytopenia        Consultants:  ID Hematology   Procedures:    Antimicrobials:    Subjective: He is feeling better than yesterday. Denies lightheaded.  Schedule tylenol for pain.  Per son patient mental confusion improving.    Objective: Vitals:   11/08/21 1130 11/08/21 1145 11/08/21 1200 11/08/21 1224  BP: 127/66 119/69 116/66 109/64  Pulse:    75  Resp: 13 15 17 16   Temp:  98.3 F (36.8 C)  98.1 F (36.7 C)  TempSrc:  Oral  Oral  SpO2:    98%  Weight:      Height:        Intake/Output Summary (Last 24 hours) at 11/08/2021 1400 Last data filed at  11/08/2021 0600 Gross per 24 hour  Intake 2232.63 ml  Output --  Net 2232.63 ml    Filed Weights   10/29/21 1345  Weight: 63.6 kg    Examination:  General exam: NAD Respiratory system: CTA Cardiovascular system: S 1, S 2 RRR Gastrointestinal system: BS present, soft, nt Central nervous system: Alert follows command Extremities: Symmetric power   Data Reviewed: I have personally reviewed following labs and imaging studies  CBC: Recent Labs  Lab 11/05/21 0227 11/05/21 1124 11/06/21 0400 11/07/21 0257 11/07/21 1315 11/07/21 2010 11/08/21 0223  WBC 2.7*  --  2.5* 2.6* 3.1* 2.8* 2.7*  NEUTROABS 1.3*  --   --   --   --   --  0.7*  HGB 7.8*  --  7.9* 7.6* 8.6* 7.3* 7.3*  HCT 23.0*  --  23.2* 22.3* 25.3* 22.0* 21.2*  MCV 91.3  --  91.7 90.3 90.7 90.9 90.6  PLT 57*   < > 66* 66* 80* 70* 69*   < > = values in this interval not displayed.    Basic Metabolic Panel: Recent Labs  Lab 11/03/21 0130 11/04/21 0748 11/05/21 0227 11/06/21 0400 11/07/21 0257 11/08/21 0223  NA 135 137 138 138 137 139  K 3.6 3.8 3.7 3.8 3.3* 3.6  CL 105 108 109 109 109 111  CO2 24 22 24 24  20* 20*  GLUCOSE 142* 112* 94 105* 102* 101*  BUN 17 15 13 9  6* 6*  CREATININE  1.04 0.89 0.96 0.87 0.78 0.81  CALCIUM 7.8* 7.7* 7.8* 7.9* 7.7* 7.8*  MG 1.6* 1.6* 1.8 1.4* 1.7  --   PHOS 3.8 3.1 3.2 2.8 2.7  --     GFR: Estimated Creatinine Clearance: 56.2 mL/min (by C-G formula based on SCr of 0.81 mg/dL). Liver Function Tests: Recent Labs  Lab 11/03/21 0130 11/04/21 0748 11/05/21 0227 11/06/21 0400 11/07/21 0257  AST  --   --  20  --   --   ALT  --   --  36  --   --   ALKPHOS  --   --  90  --   --   BILITOT  --   --  0.9  --   --   PROT  --   --  4.5*  --   --   ALBUMIN 1.8* 1.7* 1.8* 1.9* 1.9*    No results for input(s): LIPASE, AMYLASE in the last 168 hours. No results for input(s): AMMONIA in the last 168 hours.  Coagulation Profile: No results for input(s): INR, PROTIME in the last  168 hours. Cardiac Enzymes: No results for input(s): CKTOTAL, CKMB, CKMBINDEX, TROPONINI in the last 168 hours.  BNP (last 3 results) No results for input(s): PROBNP in the last 8760 hours. HbA1C: No results for input(s): HGBA1C in the last 72 hours. CBG: Recent Labs  Lab 11/07/21 1311 11/07/21 1650 11/07/21 2048 11/08/21 0756 11/08/21 1227  GLUCAP 141* 97 133* 96 127*    Lipid Profile: No results for input(s): CHOL, HDL, LDLCALC, TRIG, CHOLHDL, LDLDIRECT in the last 72 hours. Thyroid Function Tests: No results for input(s): TSH, T4TOTAL, FREET4, T3FREE, THYROIDAB in the last 72 hours. Anemia Panel: No results for input(s): VITAMINB12, FOLATE, FERRITIN, TIBC, IRON, RETICCTPCT in the last 72 hours. Sepsis Labs: Recent Labs  Lab 11/03/21 0130 11/04/21 0050 11/07/21 1315 11/07/21 1505 11/08/21 0223  PROCALCITON 0.21 0.16 <0.10  --  <0.10  LATICACIDVEN  --   --  1.6 1.5  --      Recent Results (from the past 240 hour(s))  Culture, blood (routine x 2)     Status: None   Collection Time: 11/03/21  7:58 AM   Specimen: BLOOD  Result Value Ref Range Status   Specimen Description BLOOD LEFT ANTECUBITAL  Final   Special Requests   Final    BOTTLES DRAWN AEROBIC ONLY Blood Culture results may not be optimal due to an inadequate volume of blood received in culture bottles   Culture   Final    NO GROWTH 5 DAYS Performed at [facility], [place] [address], [place], [place] [address]    Report Status 11/08/2021 FINAL  Final  Culture, blood (routine x 2)     Status: None   Collection Time: 11/03/21  7:58 AM   Specimen: BLOOD LEFT HAND  Result Value Ref Range Status   Specimen Description BLOOD LEFT HAND  Final   Special Requests   Final    BOTTLES DRAWN AEROBIC AND ANAEROBIC Blood Culture results may not be optimal due to an inadequate volume of blood received in culture bottles   Culture   Final    NO GROWTH 5 DAYS Performed at [facility], [place] [address]  [place], [place] [address]    Report Status 11/08/2021 FINAL  Final  Blastomyces Antigen     Status: None   Collection Time: 11/05/21  3:47 PM   Specimen: Blood  Result Value Ref Range Status  Blastomyces Antigen None Detected None Detected ng/mL Final    Comment: (NOTE) Results reported as ng/mL in 0.2 - 14.7 ng/mL range Results above the limit of detection but below 0.2 ng/mL are reported as 'Positive, Below the Limit of Quantification' Results above 14.7 ng/mL are reported as 'Positive, Above the Limit of Quantification'    Specimen Type SERUM  Final    Comment: (NOTE) Performed At: [facility] [place], [place] [id number] [place] WC:[id number] Performed At: [facility] [place], [place] [id number] Rush Farmer MD VQ:[id number]   Culture, blood (routine x 2)     Status: None (Preliminary result)   Collection Time: 11/07/21  2:38 PM   Specimen: BLOOD RIGHT HAND  Result Value Ref Range Status   Specimen Description BLOOD RIGHT HAND  Final   Special Requests   Final    BOTTLES DRAWN AEROBIC ONLY Blood Culture results may not be optimal due to an inadequate volume of blood received in culture bottles   Culture   Final    NO GROWTH < 24 HOURS Performed at [facility], [address] [place], [place] [address]    Report Status PENDING  Incomplete  Culture, blood (routine x 2)     Status: None (Preliminary result)   Collection Time: 11/07/21  2:38 PM   Specimen: BLOOD LEFT HAND  Result Value Ref Range Status   Specimen Description BLOOD LEFT HAND  Final   Special Requests   Final    BOTTLES DRAWN AEROBIC ONLY Blood Culture results may not be optimal due to an inadequate volume of blood received in culture bottles   Culture   Final    NO GROWTH < 24 HOURS Performed at [facility], [place] [address] [place], [place] [address]    Report Status PENDING  Incomplete          Radiology Studies: CT  BONE MARROW BIOPSY & ASPIRATION  Result Date: 11/08/2021 INDICATION: Fever of unknown origin EXAM: CT BONE MARROW BIOPSY AND ASPIRATION MEDICATIONS: None. ANESTHESIA/SEDATION: Moderate (conscious) sedation was employed during this procedure. A total of Versed 1.5 mg and Fentanyl 75 mcg was administered intravenously. Moderate Sedation Time: 11 minutes. The patient's level of consciousness and vital signs were monitored continuously by radiology nursing throughout the procedure under my direct supervision. FLUOROSCOPY TIME:  N/a COMPLICATIONS: None immediate. PROCEDURE: Informed written consent was obtained from the patient after a thorough discussion of the procedural risks, benefits and alternatives. All questions were addressed. Maximal Sterile Barrier Technique was utilized including caps, mask, sterile gowns, sterile gloves, sterile drape, hand hygiene and skin antiseptic. A timeout was performed prior to the initiation of the procedure. The patient was placed prone on the CT exam table. Limited CT of the pelvis was performed for planning purposes. Skin entry site was marked, and the overlying skin was prepped and draped in the standard sterile fashion. Local analgesia was obtained with 1% lidocaine. Using CT guidance, an 11 gauge needle was advanced just deep to the cortex of the right posterior ilium. Subsequently, bone marrow aspiration and core biopsy were performed. Specimens were submitted to lab/pathology for handling. Hemostasis was achieved with manual pressure, and a clean dressing was placed. The patient tolerated the procedure well without immediate complication. IMPRESSION: Successful CT-guided bone marrow aspiration and core biopsy of the right posterior ilium. Electronically Signed   By: Albin Felling M.D.   On: 11/08/2021 10:33  Scheduled Meds:  sodium chloride   Intravenous Once   acetaminophen  500 mg Oral BID   allopurinol  150 mg Oral Daily   colchicine  0.6 mg Oral BID    diclofenac Sodium  2 g Topical QID   insulin aspart  0-5 Units Subcutaneous QHS   insulin aspart  0-9 Units Subcutaneous TID WC   insulin glargine-yfgn  5 Units Subcutaneous Daily   pantoprazole (PROTONIX) IV  40 mg Intravenous Q12H   QUEtiapine  25 mg Oral QHS   Continuous Infusions:  lactated ringers       LOS: 11 days    Time spent: 35 minutes    Sohaib Vereen A Kaidynce Pfister, MD [facility]   If 7PM-7AM, please contact night-coverage [email or web address]  11/08/2021, 2:00 PM

## 2021-11-08 NOTE — Progress Notes (Signed)
Subjective:  He is feeling better today but continues to feel relatively poorly  Antibiotics:  Anti-infectives (From admission, onward)    Start     Dose/Rate Route Frequency Ordered Stop   10/31/21 1300  doxycycline (VIBRA-TABS) tablet 100 mg  Status:  Discontinued        100 mg Oral Every 12 hours 10/31/21 1200 11/03/21 1458   10/31/21 1245  Ampicillin-Sulbactam (UNASYN) 3 g in sodium chloride 0.9 % 100 mL IVPB  Status:  Discontinued        3 g 200 mL/hr over 30 Minutes Intravenous Every 6 hours 10/31/21 1200 11/03/21 1458   10/30/21 1045  vancomycin (VANCOREADY) IVPB 1000 mg/200 mL  Status:  Discontinued        1,000 mg 200 mL/hr over 60 Minutes Intravenous Every 36 hours 10/30/21 0957 10/31/21 1200   10/30/21 1015  ceFEPIme (MAXIPIME) 2 g in sodium chloride 0.9 % 100 mL IVPB  Status:  Discontinued        2 g 200 mL/hr over 30 Minutes Intravenous Every 12 hours 10/30/21 1010 10/31/21 1200   10/29/21 2000  ceFEPIme (MAXIPIME) 2 g in sodium chloride 0.9 % 100 mL IVPB  Status:  Discontinued        2 g 200 mL/hr over 30 Minutes Intravenous Every 24 hours 10/28/21 2011 10/30/21 1010   10/28/21 2004  vancomycin variable dose per unstable renal function (pharmacist dosing)  Status:  Discontinued         Does not apply See admin instructions 10/28/21 2004 10/31/21 1200   10/28/21 1945  ceFEPIme (MAXIPIME) 2 g in sodium chloride 0.9 % 100 mL IVPB        2 g 200 mL/hr over 30 Minutes Intravenous  Once 10/28/21 1940 10/28/21 2028   10/28/21 1945  metroNIDAZOLE (FLAGYL) IVPB 500 mg        500 mg 100 mL/hr over 60 Minutes Intravenous  Once 10/28/21 1940 10/28/21 2051   10/28/21 1945  vancomycin (VANCOREADY) IVPB 1500 mg/300 mL        1,500 mg 150 mL/hr over 120 Minutes Intravenous  Once 10/28/21 1940 10/28/21 2213       Medications: Scheduled Meds:  sodium chloride   Intravenous Once   acetaminophen  500 mg Oral BID   allopurinol  150 mg Oral Daily   colchicine  0.6 mg  Oral BID   diclofenac Sodium  2 g Topical QID   insulin aspart  0-5 Units Subcutaneous QHS   insulin aspart  0-9 Units Subcutaneous TID WC   insulin glargine-yfgn  5 Units Subcutaneous Daily   pantoprazole (PROTONIX) IV  40 mg Intravenous Q12H   QUEtiapine  25 mg Oral QHS   Continuous Infusions:  lactated ringers     PRN Meds:.acetaminophen **OR** acetaminophen, metoprolol tartrate, traZODone    Objective: Weight change:   Intake/Output Summary (Last 24 hours) at 11/08/2021 1324 Last data filed at 11/08/2021 0600 Gross per 24 hour  Intake 2232.63 ml  Output --  Net 2232.63 ml    Blood pressure 109/64, pulse 75, temperature 98.1 F (36.7 C), temperature source Oral, resp. rate 16, height 5' 2"  (1.575 m), weight 63.6 kg, SpO2 98 %. Temp:  [98.1 F (36.7 C)-98.7 F (37.1 C)] 98.1 F (36.7 C) (12/01 1224) Pulse Rate:  [64-106] 75 (12/01 1224) Resp:  [11-20] 16 (12/01 1224) BP: (100-137)/(51-84) 109/64 (12/01 1224) SpO2:  [94 %-100 %] 98 % (12/01 1224)  Physical Exam: Physical  Exam Vitals and nursing note reviewed.  Constitutional:      Appearance: He is well-developed.  HENT:     Head: Normocephalic and atraumatic.  Eyes:     Conjunctiva/sclera: Conjunctivae normal.  Cardiovascular:     Rate and Rhythm: Regular rhythm. Tachycardia present.  Pulmonary:     Effort: Pulmonary effort is normal. No respiratory distress.     Breath sounds: Normal breath sounds. No stridor. No wheezing.  Abdominal:     General: There is no distension.     Palpations: Abdomen is soft.  Musculoskeletal:        General: Normal range of motion.     Cervical back: Normal range of motion and neck supple.  Skin:    General: Skin is warm and dry.     Findings: No erythema or rash.  Neurological:     General: No focal deficit present.     Mental Status: He is alert.  Psychiatric:        Attention and Perception: Attention normal.        Mood and Affect: Mood is depressed.        Speech:  Speech normal.        Behavior: Behavior normal.        Thought Content: Thought content normal.        Cognition and Memory: Memory is impaired. He exhibits impaired recent memory and impaired remote memory.    He says that his pain in his knees has improved    CBC:    BMET Recent Labs    11/07/21 0257 11/08/21 0223  NA 137 139  K 3.3* 3.6  CL 109 111  CO2 20* 20*  GLUCOSE 102* 101*  BUN 6* 6*  CREATININE 0.78 0.81  CALCIUM 7.7* 7.8*      Liver Panel  Recent Labs    11/06/21 0400 11/07/21 0257  ALBUMIN 1.9* 1.9*        Sedimentation Rate No results for input(s): ESRSEDRATE in the last 72 hours. C-Reactive Protein No results for input(s): CRP in the last 72 hours.  Micro Results: Recent Results (from the past 720 hour(s))  Blood Culture (routine x 2)     Status: None   Collection Time: 10/28/21  6:20 PM   Specimen: BLOOD LEFT HAND  Result Value Ref Range Status   Specimen Description BLOOD LEFT HAND  Final   Special Requests   Final    BOTTLES DRAWN AEROBIC ONLY Blood Culture adequate volume   Culture   Final    NO GROWTH 5 DAYS Performed at [facility], [address] [place], [place] [address]    Report Status 11/02/2021 FINAL  Final  Blood Culture (routine x 2)     Status: None   Collection Time: 10/28/21  6:46 PM   Specimen: BLOOD LEFT ARM  Result Value Ref Range Status   Specimen Description BLOOD LEFT ARM  Final   Special Requests   Final    BOTTLES DRAWN AEROBIC AND ANAEROBIC Blood Culture adequate volume   Culture   Final    NO GROWTH 5 DAYS Performed at [facility], [place] [address] [place], [place] [address]    Report Status 11/02/2021 FINAL  Final  Resp Panel by RT-PCR (Flu A&B, Covid) Nasopharyngeal Swab     Status: None   Collection Time: 10/28/21  6:48 PM   Specimen: Nasopharyngeal Swab; Nasopharyngeal(NP) swabs in vial transport medium  Result Value Ref Range Status  SARS Coronavirus 2 by RT PCR NEGATIVE  NEGATIVE Final    Comment: (NOTE) SARS-CoV-2 target nucleic acids are NOT DETECTED.  The SARS-CoV-2 RNA is generally detectable in upper respiratory specimens during the acute phase of infection. The lowest concentration of SARS-CoV-2 viral copies this assay can detect is 138 copies/mL. A negative result does not preclude SARS-Cov-2 infection and should not be used as the sole basis for treatment or other patient management decisions. A negative result may occur with  improper specimen collection/handling, submission of specimen other than nasopharyngeal swab, presence of viral mutation(s) within the areas targeted by this assay, and inadequate number of viral copies(<138 copies/mL). A negative result must be combined with clinical observations, patient history, and epidemiological information. The expected result is Negative.  Fact Sheet for Patients:  EntrepreneurPulse.com.au  Fact Sheet for Healthcare Providers:  IncredibleEmployment.be  This test is no t yet approved or cleared by the [place] FDA and  has been authorized for detection and/or diagnosis of SARS-CoV-2 by FDA under an Emergency Use Authorization (EUA). This EUA will remain  in effect (meaning this test can be used) for the duration of the COVID-19 declaration under Section 564(b)(1) of the Act, 21 U.S.C.section 360bbb-3(b)(1), unless the authorization is terminated  or revoked sooner.       Influenza A by PCR NEGATIVE NEGATIVE Final   Influenza B by PCR NEGATIVE NEGATIVE Final    Comment: (NOTE) The Xpert Xpress SARS-CoV-2/FLU/RSV plus assay is intended as an aid in the diagnosis of influenza from Nasopharyngeal swab specimens and should not be used as a sole basis for treatment. Nasal washings and aspirates are unacceptable for Xpert Xpress SARS-CoV-2/FLU/RSV testing.  Fact Sheet for Patients: EntrepreneurPulse.com.au  Fact Sheet for Healthcare  Providers: IncredibleEmployment.be  This test is not yet approved or cleared by the [place] FDA and has been authorized for detection and/or diagnosis of SARS-CoV-2 by FDA under an Emergency Use Authorization (EUA). This EUA will remain in effect (meaning this test can be used) for the duration of the COVID-19 declaration under Section 564(b)(1) of the Act, 21 U.S.C. section 360bbb-3(b)(1), unless the authorization is terminated or revoked.  Performed at [facility], [place] [address] [place], [place] [address]   Urine Culture     Status: None   Collection Time: 10/28/21  8:51 PM   Specimen: In/Out Cath Urine  Result Value Ref Range Status   Specimen Description IN/OUT CATH URINE  Final   Special Requests NONE  Final   Culture   Final    NO GROWTH Performed at [facility], [place] [address], [place], [place] [address]    Report Status 10/29/2021 FINAL  Final  MRSA Next Gen by PCR, Nasal     Status: None   Collection Time: 10/29/21  8:51 AM   Specimen: Nasal Mucosa; Nasal Swab  Result Value Ref Range Status   MRSA by PCR Next Gen NOT DETECTED NOT DETECTED Final    Comment: (NOTE) The GeneXpert MRSA Assay (FDA approved for NASAL specimens only), is one component of a comprehensive MRSA colonization surveillance program. It is not intended to diagnose MRSA infection nor to guide or monitor treatment for MRSA infections. Test performance is not FDA approved in patients less than 36 years old. Performed at [facility], [place] [address], [place], [place] [address]   Culture, blood (routine x 2)     Status: None   Collection Time: 11/03/21  7:58 AM   Specimen:  BLOOD  Result Value Ref Range Status   Specimen Description BLOOD LEFT ANTECUBITAL  Final   Special Requests   Final    BOTTLES DRAWN AEROBIC ONLY Blood Culture results may not be optimal due to an inadequate volume of blood received in culture bottles   Culture   Final    NO GROWTH  5 DAYS Performed at [place] [address], [place], [place] [address]    Report Status 11/08/2021 FINAL  Final  Culture, blood (routine x 2)     Status: None   Collection Time: 11/03/21  7:58 AM   Specimen: BLOOD LEFT HAND  Result Value Ref Range Status   Specimen Description BLOOD LEFT HAND  Final   Special Requests   Final    BOTTLES DRAWN AEROBIC AND ANAEROBIC Blood Culture results may not be optimal due to an inadequate volume of blood received in culture bottles   Culture   Final    NO GROWTH 5 DAYS Performed at [facility], [place] [address] [place], [place] [address]    Report Status 11/08/2021 FINAL  Final  Culture, blood (routine x 2)     Status: None (Preliminary result)   Collection Time: 11/07/21  2:38 PM   Specimen: BLOOD RIGHT HAND  Result Value Ref Range Status   Specimen Description BLOOD RIGHT HAND  Final   Special Requests   Final    BOTTLES DRAWN AEROBIC ONLY Blood Culture results may not be optimal due to an inadequate volume of blood received in culture bottles   Culture   Final    NO GROWTH < 24 HOURS Performed at [facility], [place] [address] [place], [place] [address]    Report Status PENDING  Incomplete  Culture, blood (routine x 2)     Status: None (Preliminary result)   Collection Time: 11/07/21  2:38 PM   Specimen: BLOOD LEFT HAND  Result Value Ref Range Status   Specimen Description BLOOD LEFT HAND  Final   Special Requests   Final    BOTTLES DRAWN AEROBIC ONLY Blood Culture results may not be optimal due to an inadequate volume of blood received in culture bottles   Culture   Final    NO GROWTH < 24 HOURS Performed at [facility], [place] [address], [place], [place] [address]    Report Status PENDING  Incomplete    Studies/Results: CT BONE MARROW BIOPSY & ASPIRATION  Result Date: 11/08/2021 INDICATION: Fever of unknown origin EXAM: CT BONE MARROW BIOPSY AND ASPIRATION MEDICATIONS: None. ANESTHESIA/SEDATION:  Moderate (conscious) sedation was employed during this procedure. A total of Versed 1.5 mg and Fentanyl 75 mcg was administered intravenously. Moderate Sedation Time: 11 minutes. The patient's level of consciousness and vital signs were monitored continuously by radiology nursing throughout the procedure under my direct supervision. FLUOROSCOPY TIME:  N/a COMPLICATIONS: None immediate. PROCEDURE: Informed written consent was obtained from the patient after a thorough discussion of the procedural risks, benefits and alternatives. All questions were addressed. Maximal Sterile Barrier Technique was utilized including caps, mask, sterile gowns, sterile gloves, sterile drape, hand hygiene and skin antiseptic. A timeout was performed prior to the initiation of the procedure. The patient was placed prone on the CT exam table. Limited CT of the pelvis was performed for planning purposes. Skin entry site was marked, and the overlying skin was prepped and draped in the standard sterile fashion. Local analgesia was obtained with 1% lidocaine. Using CT guidance,  an 11 gauge needle was advanced just deep to the cortex of the right posterior ilium. Subsequently, bone marrow aspiration and core biopsy were performed. Specimens were submitted to lab/pathology for handling. Hemostasis was achieved with manual pressure, and a clean dressing was placed. The patient tolerated the procedure well without immediate complication. IMPRESSION: Successful CT-guided bone marrow aspiration and core biopsy of the right posterior ilium. Electronically Signed   By: Albin Felling M.D.   On: 11/08/2021 10:33      Assessment/Plan:  INTERVAL HISTORY:  Yesterday had episode of hypotension responding to fluids.  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 male Spanish-speaking man with history of stroke and left bundle branch block diabetes mellitus chronic kidney disease who had presented with confusion and labored breathing.  He had been found to be profoundly anemic and the need of blood transfusion.  He also had acute kidney injury.  He also had apparent acute onset thrombocytopenia with platelets being 101  versus 404 in January 2021 which of the last lab we have in our system.  His initial CBC had shown also 600 immature granulocytes with peripheral smear showing marked left shift with metal sites myelocytes and occasional blasts seen  Patient had imaging in the ER and blood cultures were taken CT of the abdomen pelvis and CT chest were performed with CT chest and CT abdomen pelvis is suggested infiltrate in the left upper lobe and left lower lobe.  Patient had been on very broad-spectrum antibiotics in the form of vancomycin metronidazole and cefepime followed by vancomycin and cefepime then Unasyn and doxycycline.  I believed now the doxycycline was  likely added to cover potential [facility] spotted fever, Ehrlichia  Despite being on antibiotics he then became febrile to 102.3 degrees on November 02, 2021.  When I saw him initially he was fairly confused and there is a question of whether he might have some underlying cognitive deficit.  The main complaints that he voiced to me and that the family also mentioned were his bilateral knee  calf and ankle pain.  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  However gout would certainly not explain his worsening thrombocytopenia and now leukopenia over the last several days.  I am concerned that we are missing something as far as the cause for all of his constellation of symptoms.  A tickborne infection with Ehrlichia or [place] spotted fever would make some sense to me but his failure to show much improvement with doxycycline would argue against this.   I ordered an MRI  which I reviewed and which does not show evidence of any infectious pathology in his left knee but  significant osteoarthritis and a trace effusion  CT of the chest shows stable bilateral pleural effusions as well as areas of atelectasis infiltrate but of decreased in severity since prior study  I really think he needs a bone marrow biopsy at this point with specimen sent for pathology as well as AFB and fungal cultures.  Apparently he did have thrombocytopenia and leukopenia at [facility] in October 2022 but the last time he had labs in 2021 he actually had thrombophilia and a normal white count.  Recently that visit on September 23, 2021 was for constellation of symptoms of subjective fever abdominal pain nausea emesis diarrhea cough chest pain and myalgias.  I continue to suspect he has a more subacute process such as potentially a malignancy or disseminated tuberculosis or fungal infection rather than typical bacterial infection he has had abnormal cell lines now more than a month but prior that did not as far as I can tell.  He also seems to have a waxing and waning clinical course that does not seem to correlate with any specific therapy.  His son also told me several days ago that the patient had become ill while visiting [place] several months ago and has never returned to normal.  HIV fourth-generation assay was negative.  RPR was  negative  QuantiFERON gold test was negative serum cryptococcal antigen negative urine histoplasma antigen urine Blastomyces antigen and serum Coccidioides antibody pending and Blastomyces antigen and is also negative.  Coccidioidal antibodies are pending  Multiple myeloma panel is negative and LDH still elevated CMV and EBV serologies pending  I was informed that unfortunately we only have specimen for pathology no specimen for AFB culture when the IR guided bone marrow biopsy was performed.  I have asked for call from pathology tomorrow after the initial read is made on that tissue.  Certainly if there is evidence of mycobacterial infection we can send the specimen off for PCR sequencing.  That being said that we would also want a specimen for culture.  Hypotension: Not clear what drove this acutely yesterday his blood pressure has been borderline throughout this hospitalization  Cortisol level was checked and normal blood cultures are cooking  I would prefer to hold off on empiric antibiotics for bacterial process but not opposed to them altogether, certainly if he deteriorates further I would support their use.  I spent 37  minutes with the patient including face to face counseling of the patient and his son using telephonic Spanish translator, personally reviewing IR guided biopsy updated CBC BMP culture data along with personally reviewing radiographs, along with  review of medical records before and during the visit and in coordination of his care.    LOS: 11 days   Eugene Cooper 11/08/2021, 1:24 PM

## 2021-11-08 NOTE — Sedation Documentation (Signed)
Wasted 0.5mg  Versed and 25 mcg of Fentanyl with Jaci Standard RN. Issue with being able to document waste in pyxis.

## 2021-11-08 NOTE — TOC Progression Note (Signed)
Transition of Care [facility]) - Progression Note    Patient Details  Name: Charels Stambaugh MRN: [id number] Date of Birth: 03-04-1941  Transition of Care [facility]) CM/SW [place], LCSW Phone Number: 11/08/2021, 12:21 PM  Clinical Narrative:    CSW and RNCM used Interpretor and spoke with patient's son, Jacqulyn Bath, to confirm discharge plan. Patient's other son also present. Jose asked what SNF placement would entail and CSW described how it would work with Medicaid placement since Patient only has [facility] stated that they would take patient back home with home health services instead of SNF.    Expected Discharge Plan: [place] Barriers to Discharge: Continued Medical Work up  Expected Discharge Plan and Services Expected Discharge Plan: [place]   Discharge Planning Services: CM Consult Post Acute Care Choice: Durable Medical Equipment, Home Health Living arrangements for the past 2 months: Single Family Home Expected Discharge Date: 11/08/21               DME Arranged: Hospital bed DME Agency: [employer] Date DME Agency Contacted: 11/07/21 Time DME Agency Contacted: [phone] Representative spoke with at DME Agency: [place] Arranged: RN, PT, OT [place] Agency: [place] Date [place]: 11/02/21 Time [place]: 96 Representative spoke with at [place]: [place] ([place]) Interventions    Readmission Risk Interventions No flowsheet data found.

## 2021-11-08 NOTE — Procedures (Signed)
Interventional Radiology Procedure Note  Date of Procedure: 11/08/2021  Procedure: BMBx  Findings:  1. CT guided BMBx of right posterior ilium    Complications: No immediate complications noted.   Estimated Blood Loss: minimal  Follow-up and Recommendations: 1. Bedrest 1 hour    Albin Felling, MD  Vascular & Interventional Radiology  11/08/2021 9:26 AM

## 2021-11-08 NOTE — TOC Progression Note (Signed)
Transition of Care [facility]) - Progression Note    Patient Details  Name: Eugene Cooper MRN: [id number] Date of Birth: 11-24-1941  Transition of Care [facility]) CM/SW Contact  Cyndi Bender, RN Phone Number: 11/08/2021, 1:31 PM  Clinical Narrative:    Used interpretor to speak to son, Jacqulyn Bath about transition needs. Family has decided to take patient home with [facility]. Hospital bed and 3&1 will be delivered to the home. Family can transport home when ready for discharge. Address, Phone number and PCP verified.    Expected Discharge Plan: [place] Barriers to Discharge: Continued Medical Work up  Expected Discharge Plan and Services Expected Discharge Plan: [place]   Discharge Planning Services: CM Consult Post Acute Care Choice: Durable Medical Equipment, Home Health Living arrangements for the past 2 months: Single Family Home Expected Discharge Date: 11/08/21               DME Arranged: 3-N-1, Hospital bed DME Agency: [employer] Date DME Agency Contacted: 11/08/21 Time DME Agency Contacted: [phone] Representative spoke with at DME Agency: [place]: RN, PT, OT [place] Agency: [place] Date [place]: 11/02/21 Time [place]: 22 Representative spoke with at [place]: Uncertain ([place]) Interventions    Readmission Risk Interventions No flowsheet data found.

## 2021-11-09 DIAGNOSIS — D62 Acute posthemorrhagic anemia: Secondary | ICD-10-CM | POA: Diagnosis not present

## 2021-11-09 DIAGNOSIS — R509 Fever, unspecified: Secondary | ICD-10-CM | POA: Diagnosis not present

## 2021-11-09 DIAGNOSIS — N179 Acute kidney failure, unspecified: Secondary | ICD-10-CM | POA: Diagnosis not present

## 2021-11-09 DIAGNOSIS — E440 Moderate protein-calorie malnutrition: Secondary | ICD-10-CM | POA: Insufficient documentation

## 2021-11-09 DIAGNOSIS — R40244 Other coma, without documented Glasgow coma scale score, or with partial score reported, unspecified time: Secondary | ICD-10-CM

## 2021-11-09 DIAGNOSIS — R5081 Fever presenting with conditions classified elsewhere: Secondary | ICD-10-CM

## 2021-11-09 DIAGNOSIS — D709 Neutropenia, unspecified: Secondary | ICD-10-CM

## 2021-11-09 DIAGNOSIS — A419 Sepsis, unspecified organism: Secondary | ICD-10-CM | POA: Diagnosis not present

## 2021-11-09 DIAGNOSIS — G9341 Metabolic encephalopathy: Secondary | ICD-10-CM | POA: Diagnosis not present

## 2021-11-09 DIAGNOSIS — D61818 Other pancytopenia: Secondary | ICD-10-CM

## 2021-11-09 DIAGNOSIS — D649 Anemia, unspecified: Secondary | ICD-10-CM

## 2021-11-09 DIAGNOSIS — K259 Gastric ulcer, unspecified as acute or chronic, without hemorrhage or perforation: Secondary | ICD-10-CM

## 2021-11-09 DIAGNOSIS — E1165 Type 2 diabetes mellitus with hyperglycemia: Secondary | ICD-10-CM

## 2021-11-09 DIAGNOSIS — D469 Myelodysplastic syndrome, unspecified: Secondary | ICD-10-CM

## 2021-11-09 LAB — TYPE AND SCREEN
ABO/RH(D): O POS
Antibody Screen: NEGATIVE
Unit division: 0

## 2021-11-09 LAB — CMV IGM: CMV IgM: <30 AU/mL (ref 0.0–29.9)

## 2021-11-09 LAB — CBC
HCT: 24.3 % — ABNORMAL LOW (ref 39.0–52.0)
Hemoglobin: 8.5 g/dL — ABNORMAL LOW (ref 13.0–17.0)
MCH: 31.6 pg (ref 26.0–34.0)
MCHC: 35 g/dL (ref 30.0–36.0)
MCV: 90.3 fL (ref 80.0–100.0)
Platelets: 66 10*3/uL — ABNORMAL LOW (ref 150–400)
RBC: 2.69 MIL/uL — ABNORMAL LOW (ref 4.22–5.81)
RDW: 14.9 % (ref 11.5–15.5)
WBC: 2.6 10*3/uL — ABNORMAL LOW (ref 4.0–10.5)
nRBC: 0 % (ref 0.0–0.2)

## 2021-11-09 LAB — BPAM RBC
Blood Product Expiration Date: 202212292359
ISSUE DATE / TIME: 202212011043
Unit Type and Rh: 5100

## 2021-11-09 LAB — GLUCOSE, CAPILLARY
Glucose-Capillary: 92 mg/dL (ref 70–99)
Glucose-Capillary: 111 mg/dL — ABNORMAL HIGH (ref 70–99)
Glucose-Capillary: 95 mg/dL (ref 70–99)
Glucose-Capillary: 138 mg/dL — ABNORMAL HIGH (ref 70–99)
Glucose-Capillary: 110 mg/dL — ABNORMAL HIGH (ref 70–99)

## 2021-11-09 LAB — PROCALCITONIN: Procalcitonin: <0.1 ng/mL

## 2021-11-09 LAB — CMV ANTIBODY, IGG (EIA): CMV Ab - IgG: >10 U/mL — ABNORMAL HIGH (ref 0.00–0.59)

## 2021-11-09 MED ORDER — ADULT MULTIVITAMIN W/MINERALS CH
1.0000 | ORAL_TABLET | Freq: Every day | ORAL | Status: DC
  Administered 2021-11-09 – 2021-11-10 (×2): 1 via ORAL
  Filled 2021-11-09 (×2): qty 1

## 2021-11-09 MED ORDER — ENSURE ENLIVE PO LIQD
237.0000 mL | Freq: Two times a day (BID) | ORAL | Status: DC
  Administered 2021-11-10: 237 mL via ORAL

## 2021-11-09 MED ORDER — POTASSIUM CHLORIDE CRYS ER 20 MEQ PO TBCR
20.0000 meq | EXTENDED_RELEASE_TABLET | Freq: Once | ORAL | Status: AC
  Administered 2021-11-09: 20 meq via ORAL
  Filled 2021-11-09: qty 1

## 2021-11-09 NOTE — TOC Progression Note (Signed)
Transition of Care [facility]) - Progression Note    Patient Details  Name: Eugene Cooper MRN: [id number] Date of Birth: 1941-08-21  Transition of Care [facility]) CM/SW [place], RN Phone Number: 11/09/2021, 4:21 PM  Clinical Narrative:    Spoke to patients son Jacqulyn Bath, he stated that the bed and 3:1 not delivered yet. Messaged [employer]  they are investigating delivery date  Confirmed that [place] s servicing them for [place].,   CM will continue to follow  Expected Discharge Plan: [place] Barriers to Discharge: Continued Medical Work up  Expected Discharge Plan and Services Expected Discharge Plan: [place]   Discharge Planning Services: CM Consult Post Acute Care Choice: Durable Medical Equipment, Home Health Living arrangements for the past 2 months: Single Family Home Expected Discharge Date: 11/08/21               DME Arranged: 3-N-1, Hospital bed DME Agency: [employer] Date DME Agency Contacted: 11/08/21 Time DME Agency Contacted: 1330 Representative spoke with at DME Agency: [place]: RN, PT, OT [facility] Agency: [place] Date [place]: 11/02/21 Time [place]: 30 Representative spoke with at [place]: [place]) Interventions    Readmission Risk Interventions No flowsheet data found.

## 2021-11-09 NOTE — Progress Notes (Signed)
PROGRESS NOTE    Eugene Cooper  RCV:[id number] DOB: 10-31-1941 DOA: 10/28/2021 PCP: Emelia Loron, NP   Brief Narrative: 80 year old Spanish-speaking male with past medical history significant for CVA, LBBB, diabetes type 2, CKD stage IIIa, HLD, ambulatory dysfunction who presents with altered mental status, poor oral intake, bilateral lower extremity pain, admitted for acute blood loss anemia, sinus tachycardia, AKI, hyperkalemia and severe sepsis secondary to left lower lobe pneumonia.  Initially there was concern about a fib  which was ruled out by cardiology.  Hemoglobin on admission 6.4, occult blood positive, EGD revealed gastritis.  Patient creatinine 2.5, potassium 6.2.  Chest x-ray right suprahilar opacity.  CT head negative.  CT abdomen pelvis with concern for fluid overload pulm consolidation.  CT chest with patchy consolidation within the left lower lobe and left upper lobe nodule.  Patient was transfused 1 unit of packed red blood cell with appropriate response.  Hemoglobin had remained stable.  He was initially on vancomycin and cefepime but subsequently antibiotics de-escalated to IV Unasyn and doxycycline.  He was also found to have acute metabolic encephalopathy, confusion.  He is spiking fever 102, tachycardia hypotension and leukopenia if 11/25.  ID consulted and recommended MRI of the right knee, repeat CT chest, hematology consult and bone marrow biopsy.  MRI and repeated CT chest unrevealing.  Bone marrow biopsy schedule for 12/1.  11/30; hypotensive systolic blood pressure in the 50--responded to IV fluids.  Last blood pressure 100 range.     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 CT chest concerning for left upper lobe and left lower lobe consolidation. MRI right knee with arthritic changes and quadricep tendon injury. Last fever 11/25. HIV and RPR unrevealing.  Blood cultures no growth.   2-Fever unknown etiology: -Spiked fever 11/25 -Completed treatment for pneumonia. -ID recommended bone marrow biopsy -Completed antibiotics. -QuantiFERON for TB negative.  Cryptococcal antigen negative.  Blastomycosis negative, histoplasma antigen pending. Afebrile.   3-Hypotension: -Recurrent hypotension 11/30. -TTE on admission normal left ventricular ejection fraction -Patient became hypotensive 11/30.  Systolic blood pressure dropped to the 50s.  He was symptomatic reported lightheadedness. -Blood pressure improved with 1 L of IV fluids. -Repeating CBC, 1 unit of packed red blood cells ordered. -cortisol level 17, not low. Lactic acid normal. Troponin normal.  Suspect related to dehydration, repeated hb in the afternoon with hemoconcentration. BP improved with IV fluids. BP stable.   4-ABL/IDA/GI bleed/erosive gastropathy/gastritis - EGD showed erosive gastropathy and gastritis -Received 1 unit of packed red blood cell. -He will need colonoscopy when able to drink prep -Oral Protonix twice daily for 8 weeks.  5-AKI on CKD stage IIIa: Secondary to sepsis, diuretic. Resolved with IV fluids.  6-Acute metabolic encephalopathy/delirium History of  CVA CT head, B12 ammonia and RPR unrevealing. Mental status  improving  7-Diabetes type 2 uncontrolled hyperglycemia: Hemoglobin A1c 7.5 -Continue with a sliding scale insulin -Continue with basal insulin 5 units daily  8-Pancytopenia -Hematology oncology and ID following -MM panel pending -Underwent bone marrow biopsy 12/1, results consistent with MDS. Oncology will follow up on patient today.   9-7 mm LUL nodule: He will need repeat CT chest in 6-12.  Hyperkalemia: Resolved Hypomagnesemia:  Replaced Left knee pain/tenderness and stiffness: MRI with severe arthritis changes standing intermediate grade partial width tearing of the central and lateral fibers of the distal quadriceps tendon at its insertion on the patella. Pain improving.  Continue with supportive care. History of gout: Continue with colchicine Schedule tylenol for pain.    Nutrition Problem: Increased nutrient needs Etiology: acute illness    Signs/Symptoms: estimated needs    Interventions: Ensure Enlive (each supplement provides 350kcal and 20 grams of protein)  Estimated body mass index is 25.65 kg/m as calculated from the following:   Height as of this encounter: _0  (1.575 m).   Weight as of this encounter: 63.6 kg.   DVT prophylaxis: SCDs Code Status: Full code Family Communication: Discussed with son who was at bedside 12/01 Disposition Plan:  Status is: Inpatient  Remains inpatient appropriate because: Patient with altered mental status, hypotension, pancytopenia        Consultants:  ID Hematology   Procedures:    Antimicrobials:    Subjective: He report knee pain. We discussed avoiding opioid to avoid worsening confusion.  He is eating more.   Objective: Vitals:   11/08/21 1949 11/08/21 2353 11/09/21 0400 11/09/21 0747  BP: 132/69 116/67 140/63 140/81  Pulse: 78 69 65 61  Resp: _1 Temp: 98.7 F (37.1 C) 98.4 F (36.9 C) 98.7 F (37.1 C) 99.1 F (37.3 C)  TempSrc: Oral Oral Oral Oral  SpO2: 96% 94% 97% 100%   Weight:      Height:        Intake/Output Summary (Last 24 hours) at 11/09/2021 0857 Last data filed at 11/09/2021 2683 Gross per 24 hour  Intake 1941.39 ml  Output 1450 ml  Net 491.39 ml    Filed Weights   10/29/21 1345  Weight: 63.6 kg    Examination:  General exam: NAD Respiratory system: CTA Cardiovascular system: S 1, S 2 RRR Gastrointestinal system: BS present, soft, nt Central nervous system: Alert Extremities: Symmetric power  Data Reviewed: I have personally reviewed following labs and imaging studies  CBC: Recent Labs  Lab 11/05/21 0227 11/05/21 1124 11/07/21 0257 11/07/21 1315 11/07/21 2010 11/08/21 0223 11/09/21 0130  WBC 2.7*   < > 2.6* 3.1* 2.8* 2.7* 2.6*  NEUTROABS 1.3*  --   --   --   --  0.7*  --   HGB 7.8*   < > 7.6* 8.6* 7.3* 7.3* 8.5*  HCT 23.0*   < > 22.3* 25.3* 22.0* 21.2* 24.3*  MCV 91.3   < > 90.3 90.7 90.9 90.6 90.3  PLT 57*   < > 66* 80* 70* 69* 66*   < > = values in this interval not displayed.    Basic Metabolic Panel: Recent Labs  Lab 11/03/21 0130 11/04/21 0748 11/05/21 0227 11/06/21 0400 11/07/21 0257 11/08/21 0223  NA 135 137 138 138 137 139  K 3.6 3.8 3.7 3.8 3.3* 3.6  CL 105 108 109 109 109 111  CO2 _2 20* 20*  GLUCOSE 142* 112* 94 105* 102* 101*  BUN _0 6* 6*  CREATININE 1.04 0.89 0.96 0.87 0.78 0.81  CALCIUM 7.8* 7.7* 7.8* 7.9* 7.7* 7.8*  MG 1.6* 1.6* 1.8 1.4* 1.7  --   PHOS 3.8 3.1 3.2 2.8 2.7  --     GFR: Estimated Creatinine Clearance: 56.2 mL/min (by C-G formula based on SCr of 0.81 mg/dL). Liver Function Tests: Recent Labs  Lab 11/03/21 0130 11/04/21 0748 11/05/21 0227 11/06/21 0400 11/07/21 0257  AST  --   --  20  --   --   ALT  --   --  36  --   --   ALKPHOS  --   --  90  --   --   BILITOT  --   --  0.9  --   --   PROT  --   --  4.5*  --   --   ALBUMIN 1.8* 1.7* 1.8* 1.9* 1.9*    No results for input(s): LIPASE, AMYLASE in the last 168 hours. No results for input(s):  AMMONIA in the last 168 hours.  Coagulation Profile: No results for input(s): INR, PROTIME in the last 168 hours. Cardiac Enzymes: No results for input(s): CKTOTAL, CKMB, CKMBINDEX, TROPONINI in the last 168 hours.  BNP (last 3 results) No results for input(s): PROBNP in the last 8760 hours. HbA1C: No results for input(s): HGBA1C in the last 72 hours. CBG: Recent Labs  Lab 11/08/21 0756 11/08/21 1227 11/08/21 1645 11/08/21 2048 11/09/21 0748  GLUCAP 96 127* 76 129* 92    Lipid Profile: No results for input(s): CHOL, HDL, LDLCALC, TRIG, CHOLHDL, LDLDIRECT in the last 72 hours. Thyroid Function Tests: No results for input(s): TSH, T4TOTAL, FREET4, T3FREE, THYROIDAB in the last 72 hours. Anemia Panel: No results for input(s): VITAMINB12, FOLATE, FERRITIN, TIBC, IRON, RETICCTPCT in the last 72 hours. Sepsis Labs: Recent Labs  Lab 11/04/21 0050 11/07/21 1315 11/07/21 1505 11/08/21 0223 11/09/21 0130  PROCALCITON 0.16 <0.10  --  <0.10 <0.10  LATICACIDVEN  --  1.6 1.5  --   --      Recent Results (from the past 240 hour(s))  Culture, blood (routine x 2)     Status: None   Collection Time: 11/03/21  7:58 AM   Specimen: BLOOD  Result Value Ref Range Status   Specimen Description BLOOD LEFT ANTECUBITAL  Final   Special Requests   Final    BOTTLES DRAWN AEROBIC ONLY Blood Culture results may not be optimal due to an inadequate volume of blood received in culture bottles   Culture   Final    NO GROWTH 5 DAYS Performed at [facility], [place] [address], [place], [place] [address]    Report Status 11/08/2021 FINAL  Final  Culture, blood (routine x 2)     Status: None   Collection Time: 11/03/21  7:58 AM   Specimen: BLOOD LEFT HAND  Result Value Ref Range Status   Specimen Description BLOOD LEFT HAND  Final   Special Requests   Final    BOTTLES DRAWN AEROBIC AND ANAEROBIC Blood Culture results may not be optimal due to an inadequate volume of blood received in culture  bottles   Culture   Final    NO GROWTH 5 DAYS Performed at [facility], [place] [address], [place], [place] [address]    Report Status 11/08/2021 FINAL  Final  Blastomyces Antigen     Status: None  Collection Time: 11/05/21  3:47 PM   Specimen: Blood  Result Value Ref Range Status   Blastomyces Antigen None Detected None Detected ng/mL Final    Comment: (NOTE) Results reported as ng/mL in 0.2 - 14.7 ng/mL range Results above the limit of detection but below 0.2 ng/mL are reported as 'Positive, Below the Limit of Quantification' Results above 14.7 ng/mL are reported as 'Positive, Above the Limit of Quantification'    Specimen Type SERUM  Final    Comment: (NOTE) Performed At: [facility] [place], [place] [id number] [place] ZH:[id number] Performed At: [facility] [place], [place] [id number] Rush Farmer MD GX:[id number]   Culture, blood (routine x 2)     Status: None (Preliminary result)   Collection Time: 11/07/21  2:38 PM   Specimen: BLOOD RIGHT HAND  Result Value Ref Range Status   Specimen Description BLOOD RIGHT HAND  Final   Special Requests   Final    BOTTLES DRAWN AEROBIC ONLY Blood Culture results may not be optimal due to an inadequate volume of blood received in culture bottles   Culture   Final    NO GROWTH 2 DAYS Performed at [facility], [place] [address] [place], [place] [address]    Report Status PENDING  Incomplete  Culture, blood (routine x 2)     Status: None (Preliminary result)   Collection Time: 11/07/21  2:38 PM   Specimen: BLOOD LEFT HAND  Result Value Ref Range Status   Specimen Description BLOOD LEFT HAND  Final   Special Requests   Final    BOTTLES DRAWN AEROBIC ONLY Blood Culture results may not be optimal due to an inadequate volume of blood received in culture bottles   Culture   Final    NO GROWTH 2 DAYS Performed at [facility], [place] [address],  [place], [place] [address]    Report Status PENDING  Incomplete          Radiology Studies: CT BONE MARROW BIOPSY & ASPIRATION  Result Date: 11/08/2021 INDICATION: Fever of unknown origin EXAM: CT BONE MARROW BIOPSY AND ASPIRATION MEDICATIONS: None. ANESTHESIA/SEDATION: Moderate (conscious) sedation was employed during this procedure. A total of Versed 1.5 mg and Fentanyl 75 mcg was administered intravenously. Moderate Sedation Time: 11 minutes. The patient's level of consciousness and vital signs were monitored continuously by radiology nursing throughout the procedure under my direct supervision. FLUOROSCOPY TIME:  N/a COMPLICATIONS: None immediate. PROCEDURE: Informed written consent was obtained from the patient after a thorough discussion of the procedural risks, benefits and alternatives. All questions were addressed. Maximal Sterile Barrier Technique was utilized including caps, mask, sterile gowns, sterile gloves, sterile drape, hand hygiene and skin antiseptic. A timeout was performed prior to the initiation of the procedure. The patient was placed prone on the CT exam table. Limited CT of the pelvis was performed for planning purposes. Skin entry site was marked, and the overlying skin was prepped and draped in the standard sterile fashion. Local analgesia was obtained with 1% lidocaine. Using CT guidance, an 11 gauge needle was advanced just deep to the cortex of the right posterior ilium. Subsequently, bone marrow aspiration and core biopsy were performed. Specimens were submitted to lab/pathology for handling. Hemostasis was achieved with manual pressure, and a clean dressing was placed. The patient tolerated the procedure well without immediate complication. IMPRESSION: Successful CT-guided bone marrow aspiration and core biopsy of the right posterior ilium. Electronically Signed  By: Albin Felling M.D.   On: 11/08/2021 10:33        Scheduled Meds:  sodium chloride   Intravenous Once    acetaminophen  500 mg Oral BID   allopurinol  150 mg Oral Daily   colchicine  0.6 mg Oral Daily   diclofenac Sodium  2 g Topical QID   insulin aspart  0-5 Units Subcutaneous QHS   insulin aspart  0-9 Units Subcutaneous TID WC   insulin glargine-yfgn  5 Units Subcutaneous Daily   pantoprazole (PROTONIX) IV  40 mg Intravenous Q12H   QUEtiapine  25 mg Oral QHS   Continuous Infusions:  lactated ringers 75 mL/hr at 11/08/21 1945     LOS: 12 days    Time spent: 35 minutes    Ileanna Gemmill A Delainey Winstanley, MD [facility]   If 7PM-7AM, please contact night-coverage [email or web address]  11/09/2021, 8:57 AM

## 2021-11-09 NOTE — Progress Notes (Signed)
Nutrition Follow-up  DOCUMENTATION CODES:   Non-severe (moderate) malnutrition in context of chronic illness  INTERVENTION:   Encourage good PO intake Multivitamin w/ minerals daily Ensure Enlive po BID, each supplement provides 350 kcal and 20 grams of protein RD to order snacks   NUTRITION DIAGNOSIS:   Moderate Malnutrition related to chronic illness as evidenced by severe muscle depletion, moderate fat depletion.  GOAL:   Patient will meet greater than or equal to 90% of their needs - Progressing   MONITOR:   PO intake, Supplement acceptance, Labs, Weight trends  REASON FOR ASSESSMENT:   Consult Assessment of nutrition requirement/status  ASSESSMENT:   Pt admitted from home with AMS and labored respiration secondary to sepsis, new onset afib, and anemia. PMH includes CVA, LBBB, T2DM, hyperlipidemia.  11/21: upper endoscopy found to have erosive gastritis; gastric biopsy pending 12/01: Bone Marrow Biopsy  Spanish video interpreter was used during visit.  Pt son at bedside during visit.  Pt reports that his appetite is improving and that he estimates that he is eating ~50% of his meals. Per EMR, pt intake includes:  11/26: Lunch 75% 11/27: Breakfast 15% 11/28: Breakfast 50%, Lunch 50% 11/30: Breakfast 50%, Lunch 15% 12/01: Lunch 50%  Discussed ONS with pt; pt agreeable to Ensure and HS snack.   Medications reviewed and include: SSI 0-5 units daily + 0-9 units TID, Semglee 5 units daily, Protonix Labs reviewed: BUN 6, Magnesium 1.7, 24 hr BG trends 76-129  NUTRITION - FOCUSED PHYSICAL EXAM:  Flowsheet Row Most Recent Value  Orbital Region Moderate depletion  Upper Arm Region Mild depletion  Thoracic and Lumbar Region Mild depletion  Buccal Region Moderate depletion  Temple Region Mild depletion  Clavicle Bone Region Moderate depletion  Clavicle and Acromion Bone Region Moderate depletion  Scapular Bone Region Moderate depletion  Dorsal Hand Mild  depletion  Patellar Region Severe depletion  Anterior Thigh Region Severe depletion  Posterior Calf Region Severe depletion  Edema (RD Assessment) None  Hair Reviewed  Eyes Reviewed  Mouth Reviewed  Skin Reviewed  Nails Reviewed       Diet Order:   Diet Order             Diet regular Room service appropriate? Yes; Fluid consistency: Thin  Diet effective now                   EDUCATION NEEDS:   No education needs have been identified at this time  Skin:  Skin Assessment: Reviewed RN Assessment  Last BM:  11/07/2021  Height:   Ht Readings from Last 1 Encounters:  10/29/21 5' 2"  (1.575 m)    Weight:   Wt Readings from Last 1 Encounters:  10/29/21 63.6 kg    Ideal Body Weight:  53.6 kg  BMI:  Body mass index is 25.65 kg/m.  Estimated Nutritional Needs:   Kcal:  1700-1900  Protein:  85-95g  Fluid:  >1.7L    Eline Geng Louie Casa, LDN Clinical Dietitian See AMiON for contact information.

## 2021-11-09 NOTE — Progress Notes (Signed)
Subjective:  No new complaints Antibiotics:  Anti-infectives (From admission, onward)    Start     Dose/Rate Route Frequency Ordered Stop   10/31/21 1300  doxycycline (VIBRA-TABS) tablet 100 mg  Status:  Discontinued        100 mg Oral Every 12 hours 10/31/21 1200 11/03/21 1458   10/31/21 1245  Ampicillin-Sulbactam (UNASYN) 3 g in sodium chloride 0.9 % 100 mL IVPB  Status:  Discontinued        3 g 200 mL/hr over 30 Minutes Intravenous Every 6 hours 10/31/21 1200 11/03/21 1458   10/30/21 1045  vancomycin (VANCOREADY) IVPB 1000 mg/200 mL  Status:  Discontinued        1,000 mg 200 mL/hr over 60 Minutes Intravenous Every 36 hours 10/30/21 0957 10/31/21 1200   10/30/21 1015  ceFEPIme (MAXIPIME) 2 g in sodium chloride 0.9 % 100 mL IVPB  Status:  Discontinued        2 g 200 mL/hr over 30 Minutes Intravenous Every 12 hours 10/30/21 1010 10/31/21 1200   10/29/21 2000  ceFEPIme (MAXIPIME) 2 g in sodium chloride 0.9 % 100 mL IVPB  Status:  Discontinued        2 g 200 mL/hr over 30 Minutes Intravenous Every 24 hours 10/28/21 2011 10/30/21 1010   10/28/21 2004  vancomycin variable dose per unstable renal function (pharmacist dosing)  Status:  Discontinued         Does not apply See admin instructions 10/28/21 2004 10/31/21 1200   10/28/21 1945  ceFEPIme (MAXIPIME) 2 g in sodium chloride 0.9 % 100 mL IVPB        2 g 200 mL/hr over 30 Minutes Intravenous  Once 10/28/21 1940 10/28/21 2028   10/28/21 1945  metroNIDAZOLE (FLAGYL) IVPB 500 mg        500 mg 100 mL/hr over 60 Minutes Intravenous  Once 10/28/21 1940 10/28/21 2051   10/28/21 1945  vancomycin (VANCOREADY) IVPB 1500 mg/300 mL        1,500 mg 150 mL/hr over 120 Minutes Intravenous  Once 10/28/21 1940 10/28/21 2213       Medications: Scheduled Meds:  sodium chloride   Intravenous Once   acetaminophen  500 mg Oral BID   allopurinol  150 mg Oral Daily   colchicine  0.6 mg Oral Daily   diclofenac Sodium  2 g Topical QID    insulin aspart  0-5 Units Subcutaneous QHS   insulin aspart  0-9 Units Subcutaneous TID WC   insulin glargine-yfgn  5 Units Subcutaneous Daily   pantoprazole (PROTONIX) IV  40 mg Intravenous Q12H   QUEtiapine  25 mg Oral QHS   Continuous Infusions:  lactated ringers 75 mL/hr at 11/09/21 0918   PRN Meds:.acetaminophen **OR** acetaminophen, metoprolol tartrate, traZODone    Objective: Weight change:   Intake/Output Summary (Last 24 hours) at 11/09/2021 1408 Last data filed at 11/09/2021 1100 Gross per 24 hour  Intake 2260.5 ml  Output 1950 ml  Net 310.5 ml    Blood pressure 123/73, pulse 61, temperature 98.5 F (36.9 C), temperature source Oral, resp. rate 10, height _0  (1.575 m), weight 63.6 kg, SpO2 98 %. Temp:  [98 F (36.7 C)-99.1 F (37.3 C)] 98.5 F (36.9 C) (12/02 1140) Pulse Rate:  [60-78] 61 (12/02 1140) Resp:  [9-20] 10 (12/02 1140) BP: (115-140)/(57-81) 123/73 (12/02 1140) SpO2:  [94 %-100 %] 98 % (12/02 1140)  Physical Exam: Physical Exam Vitals and nursing note  reviewed.  Constitutional:      Appearance: He is well-developed.  HENT:     Head: Normocephalic and atraumatic.  Eyes:     Conjunctiva/sclera: Conjunctivae normal.  Cardiovascular:     Rate and Rhythm: Normal rate and regular rhythm.  Pulmonary:     Effort: Pulmonary effort is normal. No respiratory distress.     Breath sounds: Normal breath sounds. No stridor. No wheezing.  Abdominal:     General: There is no distension.     Palpations: Abdomen is soft.  Musculoskeletal:        General: Normal range of motion.     Cervical back: Normal range of motion and neck supple.  Skin:    General: Skin is warm and dry.     Findings: No erythema or rash.  Neurological:     General: No focal deficit present.     Mental Status: He is alert.  Psychiatric:        Attention and Perception: Attention normal.        Mood and Affect: Mood normal.        Speech: Speech is delayed.        Behavior:  Behavior normal.        Thought Content: Thought content normal.        Cognition and Memory: Memory is impaired. He exhibits impaired recent memory.        Judgment: Judgment normal.      CBC:    BMET Recent Labs    11/07/21 0257 11/08/21 0223  NA 137 139  K 3.3* 3.6  CL 109 111  CO2 20* 20*  GLUCOSE 102* 101*  BUN 6* 6*  CREATININE 0.78 0.81  CALCIUM 7.7* 7.8*      Liver Panel  Recent Labs    11/07/21 0257  ALBUMIN 1.9*        Sedimentation Rate No results for input(s): ESRSEDRATE in the last 72 hours. C-Reactive Protein No results for input(s): CRP in the last 72 hours.  Micro Results: Recent Results (from the past 720 hour(s))  Blood Culture (routine x 2)     Status: None   Collection Time: 10/28/21  6:20 PM   Specimen: BLOOD LEFT HAND  Result Value Ref Range Status   Specimen Description BLOOD LEFT HAND  Final   Special Requests   Final    BOTTLES DRAWN AEROBIC ONLY Blood Culture adequate volume   Culture   Final    NO GROWTH 5 DAYS Performed at [facility], [address] [place], [place] [address]    Report Status 11/02/2021 FINAL  Final  Blood Culture (routine x 2)     Status: None   Collection Time: 10/28/21  6:46 PM   Specimen: BLOOD LEFT ARM  Result Value Ref Range Status   Specimen Description BLOOD LEFT ARM  Final   Special Requests   Final    BOTTLES DRAWN AEROBIC AND ANAEROBIC Blood Culture adequate volume   Culture   Final    NO GROWTH 5 DAYS Performed at [facility], [place] [address], [place], [place] [address]    Report Status 11/02/2021 FINAL  Final  Resp Panel by RT-PCR (Flu A&B, Covid) Nasopharyngeal Swab     Status: None   Collection Time: 10/28/21  6:48 PM   Specimen: Nasopharyngeal Swab; Nasopharyngeal(NP) swabs in vial transport medium  Result Value Ref Range Status   SARS Coronavirus 2 by RT PCR NEGATIVE NEGATIVE Final  Comment: (NOTE) SARS-CoV-2 target nucleic acids are NOT DETECTED.  The  SARS-CoV-2 RNA is generally detectable in upper respiratory specimens during the acute phase of infection. The lowest concentration of SARS-CoV-2 viral copies this assay can detect is 138 copies/mL. A negative result does not preclude SARS-Cov-2 infection and should not be used as the sole basis for treatment or other patient management decisions. A negative result may occur with  improper specimen collection/handling, submission of specimen other than nasopharyngeal swab, presence of viral mutation(s) within the areas targeted by this assay, and inadequate number of viral copies(<138 copies/mL). A negative result must be combined with clinical observations, patient history, and epidemiological information. The expected result is Negative.  Fact Sheet for Patients:  EntrepreneurPulse.com.au  Fact Sheet for Healthcare Providers:  IncredibleEmployment.be  This test is no t yet approved or cleared by the [place] FDA and  has been authorized for detection and/or diagnosis of SARS-CoV-2 by FDA under an Emergency Use Authorization (EUA). This EUA will remain  in effect (meaning this test can be used) for the duration of the COVID-19 declaration under Section 564(b)(1) of the Act, 21 U.S.C.section 360bbb-3(b)(1), unless the authorization is terminated  or revoked sooner.       Influenza A by PCR NEGATIVE NEGATIVE Final   Influenza B by PCR NEGATIVE NEGATIVE Final    Comment: (NOTE) The Xpert Xpress SARS-CoV-2/FLU/RSV plus assay is intended as an aid in the diagnosis of influenza from Nasopharyngeal swab specimens and should not be used as a sole basis for treatment. Nasal washings and aspirates are unacceptable for Xpert Xpress SARS-CoV-2/FLU/RSV testing.  Fact Sheet for Patients: EntrepreneurPulse.com.au  Fact Sheet for Healthcare Providers: IncredibleEmployment.be  This test is not yet approved or  cleared by the [place] FDA and has been authorized for detection and/or diagnosis of SARS-CoV-2 by FDA under an Emergency Use Authorization (EUA). This EUA will remain in effect (meaning this test can be used) for the duration of the COVID-19 declaration under Section 564(b)(1) of the Act, 21 U.S.C. section 360bbb-3(b)(1), unless the authorization is terminated or revoked.  Performed at [facility], [place] [address], [place], [place] [address]   Urine Culture     Status: None   Collection Time: 10/28/21  8:51 PM   Specimen: In/Out Cath Urine  Result Value Ref Range Status   Specimen Description IN/OUT CATH URINE  Final   Special Requests NONE  Final   Culture   Final    NO GROWTH Performed at [facility], [place] [address] [place], [place] [address]    Report Status 10/29/2021 FINAL  Final  MRSA Next Gen by PCR, Nasal     Status: None   Collection Time: 10/29/21  8:51 AM   Specimen: Nasal Mucosa; Nasal Swab  Result Value Ref Range Status   MRSA by PCR Next Gen NOT DETECTED NOT DETECTED Final    Comment: (NOTE) The GeneXpert MRSA Assay (FDA approved for NASAL specimens only), is one component of a comprehensive MRSA colonization surveillance program. It is not intended to diagnose MRSA infection nor to guide or monitor treatment for MRSA infections. Test performance is not FDA approved in patients less than 22 years old. Performed at [facility], [place] [address] [place], [place] [address]   Culture, blood (routine x 2)     Status: None   Collection Time: 11/03/21  7:58 AM   Specimen: BLOOD  Result Value Ref Range Status   Specimen Description BLOOD  LEFT ANTECUBITAL  Final   Special Requests   Final    BOTTLES DRAWN AEROBIC ONLY Blood Culture results may not be optimal due to an inadequate volume of blood received in culture bottles   Culture   Final    NO GROWTH 5 DAYS Performed at [facility], [place] [address], [place], [place] [address]     Report Status 11/08/2021 FINAL  Final  Culture, blood (routine x 2)     Status: None   Collection Time: 11/03/21  7:58 AM   Specimen: BLOOD LEFT HAND  Result Value Ref Range Status   Specimen Description BLOOD LEFT HAND  Final   Special Requests   Final    BOTTLES DRAWN AEROBIC AND ANAEROBIC Blood Culture results may not be optimal due to an inadequate volume of blood received in culture bottles   Culture   Final    NO GROWTH 5 DAYS Performed at [facility], [place] [address] [place], [place] [address]    Report Status 11/08/2021 FINAL  Final  Blastomyces Antigen     Status: None   Collection Time: 11/05/21  3:47 PM   Specimen: Blood  Result Value Ref Range Status   Blastomyces Antigen None Detected None Detected ng/mL Final    Comment: (NOTE) Results reported as ng/mL in 0.2 - 14.7 ng/mL range Results above the limit of detection but below 0.2 ng/mL are reported as 'Positive, Below the Limit of Quantification' Results above 14.7 ng/mL are reported as 'Positive, Above the Limit of Quantification'    Specimen Type SERUM  Final    Comment: (NOTE) Performed At: [facility] [place], [place] [id number] Bruce Donath MD OE:[id number] Performed At: [facility] [address] [place], [place] [id number] Rush Farmer MD CV:[id number]   Culture, blood (routine x 2)     Status: None (Preliminary result)   Collection Time: 11/07/21  2:38 PM   Specimen: BLOOD RIGHT HAND  Result Value Ref Range Status   Specimen Description BLOOD RIGHT HAND  Final   Special Requests   Final    BOTTLES DRAWN AEROBIC ONLY Blood Culture results may not be optimal due to an inadequate volume of blood received in culture bottles   Culture   Final    NO GROWTH 2 DAYS Performed at [facility], [place] [address], [place], [place] [address]    Report Status PENDING  Incomplete  Culture, blood (routine x 2)     Status: None (Preliminary result)   Collection  Time: 11/07/21  2:38 PM   Specimen: BLOOD LEFT HAND  Result Value Ref Range Status   Specimen Description BLOOD LEFT HAND  Final   Special Requests   Final    BOTTLES DRAWN AEROBIC ONLY Blood Culture results may not be optimal due to an inadequate volume of blood received in culture bottles   Culture   Final    NO GROWTH 2 DAYS Performed at [facility], [place] [address] [place],  [address]    Report Status PENDING  Incomplete    Studies/Results: CT BONE MARROW BIOPSY & ASPIRATION  Result Date: 11/08/2021 INDICATION: Fever of unknown origin EXAM: CT BONE MARROW BIOPSY AND ASPIRATION MEDICATIONS: None. ANESTHESIA/SEDATION: Moderate (conscious) sedation was employed during this procedure. A total of Versed 1.5 mg and Fentanyl 75 mcg was administered intravenously. Moderate Sedation Time: 11 minutes. The patient's level of consciousness and vital signs were monitored continuously by radiology nursing throughout  the procedure under my direct supervision. FLUOROSCOPY TIME:  N/a COMPLICATIONS: None immediate. PROCEDURE: Informed written consent was obtained from the patient after a thorough discussion of the procedural risks, benefits and alternatives. All questions were addressed. Maximal Sterile Barrier Technique was utilized including caps, mask, sterile gowns, sterile gloves, sterile drape, hand hygiene and skin antiseptic. A timeout was performed prior to the initiation of the procedure. The patient was placed prone on the CT exam table. Limited CT of the pelvis was performed for planning purposes. Skin entry site was marked, and the overlying skin was prepped and draped in the standard sterile fashion. Local analgesia was obtained with 1% lidocaine. Using CT guidance, an 11 gauge needle was advanced just deep to the cortex of the right posterior ilium. Subsequently, bone marrow aspiration and core biopsy were performed. Specimens were submitted to lab/pathology for handling. Hemostasis  was achieved with manual pressure, and a clean dressing was placed. The patient tolerated the procedure well without immediate complication. IMPRESSION: Successful CT-guided bone marrow aspiration and core biopsy of the right posterior ilium. Electronically Signed   By: Albin Felling M.D.   On: 11/08/2021 10:33      Assessment/Plan:  INTERVAL HISTORY:  Bone marrow biopsy was not yet back when I saw the patient this morning and talk to him and his son using Spanish telephonic translator   Principal Problem:   Fever of unknown origin Active Problems:   Controlled type 2 diabetes mellitus with hyperglycemia, without long-term current use of insulin ([place])   Acute blood loss anemia   Hypotension   Atrial fibrillation with RVR (HCC)   Hyperkalemia   Metabolic acidosis, increased anion gap (IAG)   Acute metabolic encephalopathy   AKI (acute kidney injury) ([place])   History of stroke   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 male Spanish-speaking man with history of stroke and left bundle branch block diabetes mellitus chronic kidney disease who had presented with confusion and labored breathing.  He had been found to be profoundly anemic and the need of blood transfusion.  He also had acute kidney injury.  He also had apparent acute onset thrombocytopenia with platelets being 101  versus 404 in January 2021 which of the last lab we have in our system.  His initial CBC had shown also 600 immature granulocytes with peripheral smear showing marked left shift with metal sites myelocytes and occasional blasts seen  Patient had imaging in the ER and blood cultures were taken CT of the abdomen pelvis and CT chest were performed with CT chest and CT abdomen pelvis is  suggested infiltrate in the left upper lobe and left lower lobe.  Patient had been on very broad-spectrum antibiotics in the form of vancomycin metronidazole and cefepime followed by vancomycin and cefepime then Unasyn and doxycycline. ecently that visit on September 23, 2021 was for constellation of symptoms of subjective fever abdominal pain nausea emesis diarrhea cough chest pain and myalgias.  I continued to suspect he has a more subacute process such as potentially a malignancy or disseminated tuberculosis or fungal infection rather than typical bacterial infection he has had abnormal cell lines now more than a month but prior that did not as far as I  can tell.  He also seems to have had a waxing and waning clinical course that does not seem to correlate with any specific therapy.  His son also told me several days ago that the patient had become ill while visiting [place] several months ago and has never returned to normal.  HIV fourth-generation assay was negative.  RPR was negative  QuantiFERON gold test was negative serum cryptococcal antigen negative urine histoplasma antigen urine Blastomyces antigen and serum Coccidioides antibody pending and Blastomyces antigen and is also negative.  Coccidioidal antibodies are pending  Multiple myeloma panel is negative and LDH still elevated CMV and EBV serologies pending  I was informed that unfortunately we only have specimen for pathology no specimen for AFB culture when the IR guided bone marrow biopsy was performed.  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  I will sign off for now please call with further questions.   LOS: 12 days   CAlcide Evener12/01/2021, 2:08 PM

## 2021-11-09 NOTE — Progress Notes (Addendum)
HEMATOLOGY-ONCOLOGY PROGRESS NOTE  SUBJECTIVE: Spanish video interpreter was used for this visit.  The patient son is at the bedside.  His knee pain has improved.  No fever since 11/25.  Overall, he states that he feels better.  REVIEW OF SYSTEMS:   Constitutional: Denies fevers, chills  Eyes: Denies blurriness of vision Ears, nose, mouth, throat, and face: Denies mucositis or sore throat Respiratory: Denies cough, dyspnea or wheezes Cardiovascular: Denies palpitation, chest discomfort Gastrointestinal:  Denies nausea, heartburn or change in bowel habits Skin: Denies abnormal skin rashes Lymphatics: Denies new lymphadenopathy or easy bruising Neurological:Denies numbness, tingling or new weaknesses Behavioral/Psych: Mood is stable, no new changes  Extremities: No lower extremity edema All other systems were reviewed with the patient and are negative.  I have reviewed the past medical history, past surgical history, social history and family history with the patient and they are unchanged from previous note.   PHYSICAL EXAMINATION: ECOG PERFORMANCE STATUS: 1 - Symptomatic but completely ambulatory  Vitals:   11/09/21 0400 11/09/21 0747  BP: 140/63 140/81  Pulse: 65 61  Resp: 16 12  Temp: 98.7 F (37.1 C) 99.1 F (37.3 C)  SpO2: 97% 100%   Filed Weights   10/29/21 1345  Weight: 63.6 kg    Intake/Output from previous day: 12/01 0701 - 12/02 0700 In: 1941.4 [P.O.:360; I.V.:1051.4; Blood:530] Out: 1450 [Urine:1450]  GENERAL:alert, no distress and comfortable SKIN: skin color, texture, turgor are normal, no rashes or significant lesions EYES: normal, Conjunctiva are pink and non-injected, sclera clear LUNGS: clear to auscultation and percussion with normal breathing effort HEART: regular rate & rhythm and no murmurs and no lower extremity edema ABDOMEN:abdomen soft, non-tender and normal bowel sounds NEURO: Alert and oriented  LABORATORY DATA:  I have reviewed the data  as listed CMP Latest Ref Rng & Units 11/08/2021 11/07/2021 11/06/2021  Glucose 70 - 99 mg/dL 101(H) 102(H) 105(H)  BUN 8 - 23 mg/dL 6(L) 6(L) 9  Creatinine 0.61 - 1.24 mg/dL 0.81 0.78 0.87  Sodium 135 - 145 mmol/L 139 137 138  Potassium 3.5 - 5.1 mmol/L 3.6 3.3(L) 3.8  Chloride 98 - 111 mmol/L 111 109 109  CO2 22 - 32 mmol/L 20(L) 20(L) 24  Calcium 8.9 - 10.3 mg/dL 7.8(L) 7.7(L) 7.9(L)  Total Protein 6.5 - 8.1 g/dL - - -  Total Bilirubin 0.3 - 1.2 mg/dL - - -  Alkaline Phos 38 - 126 U/L - - -  AST 15 - 41 U/L - - -  ALT 0 - 44 U/L - - -    Lab Results  Component Value Date   WBC 2.6 (L) 11/09/2021   HGB 8.5 (L) 11/09/2021   HCT 24.3 (L) 11/09/2021   MCV 90.3 11/09/2021   PLT 66 (L) 11/09/2021   NEUTROABS 0.7 (L) 11/08/2021    CT ABDOMEN PELVIS WO CONTRAST  Result Date: 10/30/2021 CLINICAL DATA:  Fever of unknown origin EXAM: CT ABDOMEN AND PELVIS WITHOUT CONTRAST TECHNIQUE: Multidetector CT imaging of the abdomen and pelvis was performed following the standard protocol without IV contrast. COMPARISON:  January 2021 FINDINGS: Lower chest: Partially imaged left lower lobe consolidative opacity. Additional bibasilar atelectasis. Hepatobiliary: No focal liver abnormality is seen. No gallstones, gallbladder wall thickening, or biliary dilatation. Pancreas: Unremarkable. Spleen: Unremarkable. Adrenals/Urinary Tract: Adrenals, kidneys, and bladder are unremarkable. Stomach/Bowel: Stomach is within normal limits. Bowel is normal in caliber. Vascular/Lymphatic: Mild aortic atherosclerosis. No enlarged lymph nodes. Reproductive: Unremarkable. Other: No free fluid.  Abdominal wall is unremarkable. Musculoskeletal:  Lumbar spine degenerative changes. No acute osseous abnormality. IMPRESSION: Partially imaged consolidative opacity left lower lobe may reflect atelectasis or pneumonia. No acute abnormality in the abdomen or pelvis. Electronically Signed   By: Macy Mis M.D.   On: 10/30/2021 14:06    DG Chest 2 View  Result Date: 10/30/2021 CLINICAL DATA:  Fever EXAM: CHEST - 2 VIEW COMPARISON:  10/28/2021 FINDINGS: Transverse diameter of heart is increased. There are no signs of pulmonary edema or focal pulmonary consolidation. In the immediate previous study there was a possible nodular density described in the medial right upper lung fields. In the current study, there is a cardiac monitoring lead superimposed over the region of possible nodule seen in the previous examination limiting evaluation for a nodule. There is no pleural effusion or pneumothorax. IMPRESSION: Cardiomegaly. There are no signs of pulmonary edema or focal pulmonary consolidation. In the previous study there was a possible nodular density in the medial right upper lung fields which is partly obscured by cardiac monitoring lead limiting comparison. Short-term follow-up chest radiographs and CT if warranted should be considered. Electronically Signed   By: Elmer Picker M.D.   On: 10/30/2021 08:38   DG Knee 1-2 Views Right  Result Date: 11/01/2021 CLINICAL DATA:  Pain EXAM: RIGHT KNEE - 1-2 VIEW COMPARISON:  None. FINDINGS: Enthesopathic changes off the superior patella. No joint effusion. No fracture. No other acute abnormalities. IMPRESSION: Enthesopathic changes off the superior patella. No other abnormalities. Electronically Signed   By: Dorise Bullion III M.D.   On: 11/01/2021 16:26   CT HEAD WO CONTRAST (5MM)  Result Date: 10/28/2021 CLINICAL DATA:  Patient states he cannot: Self up since yesterday. EXAM: CT HEAD WITHOUT CONTRAST CT CERVICAL SPINE WITHOUT CONTRAST TECHNIQUE: Multidetector CT imaging of the head and cervical spine was performed following the standard protocol without intravenous contrast. Multiplanar CT image reconstructions of the cervical spine were also generated. COMPARISON:  CT scan of the brain December 19, 2019. FINDINGS: CT HEAD FINDINGS Brain: Chronic white matter changes are stable. No  evidence of acute infarction, hemorrhage, hydrocephalus, extra-axial collection or mass lesion/mass effect. Vascular: Calcified atherosclerosis in the intracranial carotids. Skull: Normal. Negative for fracture or focal lesion. Sinuses/Orbits: No acute finding. Other: None. CT CERVICAL SPINE FINDINGS Alignment: Normal. Skull base and vertebrae: No acute fracture. No primary bone lesion or focal pathologic process. Soft tissues and spinal canal: No prevertebral fluid or swelling. No visible canal hematoma. Disc levels:  Mild multilevel degenerative disc disease. Upper chest: Negative. Other: No other abnormalities. IMPRESSION: 1. No acute intracranial abnormalities. 2. No fracture or traumatic malalignment in the cervical spine. Electronically Signed   By: Dorise Bullion III M.D.   On: 10/28/2021 19:49   CT CHEST W CONTRAST  Result Date: 11/04/2021 CLINICAL DATA:  Fever. EXAM: CT CHEST WITH CONTRAST TECHNIQUE: Multidetector CT imaging of the chest was performed during intravenous contrast administration. CONTRAST:  3mL OMNIPAQUE IOHEXOL 300 MG/ML  SOLN COMPARISON:  October 31, 2021 FINDINGS: Cardiovascular: There is mild calcification of the aortic arch, without evidence of aortic aneurysm or dissection. Normal heart size with mild coronary artery calcification. No pericardial effusion. Mediastinum/Nodes: There is mild AP window, right paratracheal and right hilar lymphadenopathy. There is stable marked severity narrowing of the right mainstem bronchus (axial CT image 51, CT series 4). The thyroid gland and esophagus demonstrate no significant findings. Lungs/Pleura: Mild areas of atelectasis are seen within the inferior aspect of the left upper lobe and posterior aspects of the  bilateral lung bases. This is mildly decreased in severity within the superior segment of the left upper lobe when compared to the prior study and stable in severity throughout the remaining portions of the bilateral lung bases and  left upper lobe. A stable 7 mm left upper lobe noncalcified lung nodule is seen (axial CT image 91, CT series 4). Small, stable bilateral pleural effusions are noted. No pneumothorax is identified. Upper Abdomen: No acute abnormality. Musculoskeletal: No chest wall abnormality. No acute or significant osseous findings. IMPRESSION: 1. Mild areas of left upper lobe and bilateral lower lobe atelectasis and/or infiltrate, mildly decreased in severity within the left lower lobe since the prior study. 2. Small, stable bilateral pleural effusions. 3. Stable 7 mm left upper lobe noncalcified lung nodule. Non-contrast chest CT at 6-12 months is recommended. If the nodule is stable at time of repeat CT, then future CT at 18-24 months (from today's scan) is considered optional for low-risk patients, but is recommended for high-risk patients. This recommendation follows the consensus statement: Guidelines for Management of Incidental Pulmonary Nodules Detected on CT Images: From the Fleischner Society 2017; Radiology 2017; 284:228-243. 4. Stable marked severity narrowing of the right mainstem bronchus. 5. Mild coronary artery disease. 6. Aortic atherosclerosis. Aortic Atherosclerosis (ICD10-I70.0). Electronically Signed   By: Virgina Norfolk M.D.   On: 11/04/2021 20:23   CT CHEST W CONTRAST  Result Date: 10/31/2021 CLINICAL DATA:  Fever of unknown origin, consolidation left lung base, pulmonary nodule EXAM: CT CHEST WITH CONTRAST TECHNIQUE: Multidetector CT imaging of the chest was performed during intravenous contrast administration. CONTRAST:  55mL OMNIPAQUE IOHEXOL 300 MG/ML  SOLN COMPARISON:  10/30/2021, 10/28/2021 FINDINGS: Cardiovascular: The heart is unremarkable without pericardial effusion. No evidence of thoracic aortic aneurysm or dissection. Moderate atherosclerosis of the aortic arch. Mediastinum/Nodes: Borderline enlarged 10 mm short axis lymph nodes are seen within the as ago esophageal recess. No  pathologic mediastinal, hilar, or axillary adenopathy. Thyroid gland, trachea, and esophagus demonstrate no significant findings. Lungs/Pleura: Dependent hypoventilatory changes are seen at the lung bases. There is patchy consolidation within the superior segment left lower lobe and lingula, which may be hypoventilatory or infectious. Trace bilateral pleural effusions are noted. There is no pneumothorax. A single solid 7 mm nodule is seen within the lingular segment left upper lobe, reference image 100/4. No other pulmonary nodules or masses. Specifically, the right upper lobe density seen on recent chest x-ray likely corresponds to overlapping shadows or vascular shadow. Upper Abdomen: No acute abnormality. Musculoskeletal: No acute or destructive bony lesions. Reconstructed images demonstrate no additional findings. IMPRESSION: 1. Patchy consolidation within the superior segment left lower lobe and left upper lobe, which may reflect bronchopneumonia in a patient with a history of fever. Other areas of dependent consolidations within the lower lobes along the posterior costophrenic angles are likely hypoventilatory. 2. Trace bilateral pleural effusions. 3. 7 mm left solid pulmonary nodule within the upper lobe. Recommend a non-contrast Chest CT at 6-12 months. If patient is high risk for malignancy, recommend an additional non-contrast Chest CT at 18-24 months; if patient is low risk for malignancy a non-contrast Chest CT at 18-24 months is optional. These guidelines do not apply to immunocompromised patients and patients with cancer. Follow up in patients with significant comorbidities as clinically warranted. For lung cancer screening, adhere to Lung-RADS guidelines. Reference: Radiology. 2017; 284(1):228-43. 4.  Aortic Atherosclerosis (ICD10-I70.0). Electronically Signed   By: Randa Ngo M.D.   On: 10/31/2021 15:53   CT Cervical Spine  Wo Contrast  Result Date: 10/28/2021 CLINICAL DATA:  Patient states  he cannot: Self up since yesterday. EXAM: CT HEAD WITHOUT CONTRAST CT CERVICAL SPINE WITHOUT CONTRAST TECHNIQUE: Multidetector CT imaging of the head and cervical spine was performed following the standard protocol without intravenous contrast. Multiplanar CT image reconstructions of the cervical spine were also generated. COMPARISON:  CT scan of the brain December 19, 2019. FINDINGS: CT HEAD FINDINGS Brain: Chronic white matter changes are stable. No evidence of acute infarction, hemorrhage, hydrocephalus, extra-axial collection or mass lesion/mass effect. Vascular: Calcified atherosclerosis in the intracranial carotids. Skull: Normal. Negative for fracture or focal lesion. Sinuses/Orbits: No acute finding. Other: None. CT CERVICAL SPINE FINDINGS Alignment: Normal. Skull base and vertebrae: No acute fracture. No primary bone lesion or focal pathologic process. Soft tissues and spinal canal: No prevertebral fluid or swelling. No visible canal hematoma. Disc levels:  Mild multilevel degenerative disc disease. Upper chest: Negative. Other: No other abnormalities. IMPRESSION: 1. No acute intracranial abnormalities. 2. No fracture or traumatic malalignment in the cervical spine. Electronically Signed   By: Dorise Bullion III M.D.   On: 10/28/2021 19:49   MR KNEE LEFT W WO CONTRAST  Result Date: 11/05/2021 CLINICAL DATA:  Septic arthritis suspected, knee, xray done EXAM: MRI OF THE LEFT KNEE WITHOUT AND WITH CONTRAST TECHNIQUE: Multiplanar, multisequence MR imaging of the left knee was performed both before and after administration of intravenous contrast. CONTRAST:  6.29m GADAVIST GADOBUTROL 1 MMOL/ML IV SOLN COMPARISON:  None. FINDINGS: MENISCI Medial: Nondisplaced degenerative tearing of the posterior horn and body of the medial meniscus. Lateral: Intrasubstance degenerative signal without definitive tear. LIGAMENTS Cruciates: ACL and PCL are intact. Collaterals: Medial collateral ligament is intact. Lateral  collateral ligament complex is intact. CARTILAGE Patellofemoral: Mild chondrosis. There is a 2 mm intermediate grade focal defect along the medial trochlea (series 3, image 14). Medial: Overall moderate chondrosis with intermediate grade chondral fissuring along the weight-bearing medial femoral condyle and partial-thickness cartilage loss along the tibial plateau. Lateral:  Mild chondrosis.  No focal defect. JOINT: Small joint effusion with appropriately thin synovial enhancement. POPLITEAL FOSSA: No significant Baker cyst. EXTENSOR MECHANISM: There is intermediate grade, partial width tearing of the central and lateral fibers of the distal quadriceps tendon at its insertion on the patella, and with reactive adjacent edema/cystic change in the patella. The patella tendon appears mildly lax with insertional patellar tendinosis at the patella. BONES: There is no acute fracture or dislocation. There is mild bony edema signal within the fibular head. The cortex is intact. There is no aggressive osseous lesion. Other: There is intramuscular edema signal within the distal vastus medialis and lateralis muscles which is likely reactive in association with the quadriceps tendon tearing. There is also intramuscular edema signal within the proximal aspect of the anterior compartment muscles in the lower leg, which is likely reactive as well. No focal fluid collection. No abnormal enhancement. IMPRESSION: Tricompartment osteoarthritis, worst in the medial compartment with nondisplaced degenerative tearing of the posterior horn and body of the medial meniscus. Moderate medial compartment chondrosis with intermediate grade chondral fissuring and partial-thickness cartilage loss along the weight-bearing surfaces. Mild patellofemoral chondrosis with 2 mm focal chondral defect along the medial trochlea. Intermediate grade, partial width tearing of the central and lateral fibers of the distal quadriceps tendon at its insertion on  the patella. Reactive intramuscular edema within the distal vastus medialis and lateralis muscles as well as the proximal anterior compartment muscles in the lower leg. Moderate proximal insertional  patellar tendinosis. Small joint effusion, which is likely reactive to the above findings. No convincing findings to suggest septic arthritis or osteomyelitis. Electronically Signed   By: Maurine Simmering M.D.   On: 11/05/2021 08:24   DG Chest Port 1 View  Result Date: 10/28/2021 CLINICAL DATA:  Sepsis EXAM: PORTABLE CHEST 1 VIEW COMPARISON:  04/25/2016 FINDINGS: The lungs are symmetrically expanded. No focal pulmonary infiltrate identified. There is a nodular opacity within the right suprahilar region which may represent vascular shadow or a focal pulmonary nodule, but is not we well characterized ll on this examination. This does, however, appear more prominent than on prior examination. No pneumothorax or pleural effusion. Cardiac size is within normal limits. No acute bone abnormality. IMPRESSION: No radiographic evidence of acute cardiopulmonary disease. Nodular opacity within the right suprahilar region, indeterminate. Standard two view chest radiograph may be helpful in excluding and artifactual opacity. Electronically Signed   By: Fidela Salisbury M.D.   On: 10/28/2021 19:07   CT BONE MARROW BIOPSY & ASPIRATION  Result Date: 11/08/2021 INDICATION: Fever of unknown origin EXAM: CT BONE MARROW BIOPSY AND ASPIRATION MEDICATIONS: None. ANESTHESIA/SEDATION: Moderate (conscious) sedation was employed during this procedure. A total of Versed 1.5 mg and Fentanyl 75 mcg was administered intravenously. Moderate Sedation Time: 11 minutes. The patient's level of consciousness and vital signs were monitored continuously by radiology nursing throughout the procedure under my direct supervision. FLUOROSCOPY TIME:  N/a COMPLICATIONS: None immediate. PROCEDURE: Informed written consent was obtained from the patient after a  thorough discussion of the procedural risks, benefits and alternatives. All questions were addressed. Maximal Sterile Barrier Technique was utilized including caps, mask, sterile gowns, sterile gloves, sterile drape, hand hygiene and skin antiseptic. A timeout was performed prior to the initiation of the procedure. The patient was placed prone on the CT exam table. Limited CT of the pelvis was performed for planning purposes. Skin entry site was marked, and the overlying skin was prepped and draped in the standard sterile fashion. Local analgesia was obtained with 1% lidocaine. Using CT guidance, an 11 gauge needle was advanced just deep to the cortex of the right posterior ilium. Subsequently, bone marrow aspiration and core biopsy were performed. Specimens were submitted to lab/pathology for handling. Hemostasis was achieved with manual pressure, and a clean dressing was placed. The patient tolerated the procedure well without immediate complication. IMPRESSION: Successful CT-guided bone marrow aspiration and core biopsy of the right posterior ilium. Electronically Signed   By: Albin Felling M.D.   On: 11/08/2021 10:33   ECHOCARDIOGRAM COMPLETE  Result Date: 10/29/2021    ECHOCARDIOGRAM REPORT   Patient Name:   TRAMEL WESTBROOK Date of Exam: 10/29/2021 Medical Rec #:  [id number]          Height:       62.0 in Accession #:    [id number]         Weight:       163.0 lb Date of Birth:  1941/01/06          BSA:          1.753 m Patient Age:    1 years           BP:           108/61 mmHg Patient Gender: M                  HR:           117 bpm. Exam Location:  Inpatient Procedure: 2D  Echo, Cardiac Doppler and Color Doppler Indications:    Afib  History:        Patient has prior history of Echocardiogram examinations, most                 recent 12/20/2019. Stroke; Risk Factors:Diabetes and                 Hypertension.  Sonographer:    Jyl Heinz Referring Phys: [id number] [place] T [facility] IMPRESSIONS  1. Left  ventricular ejection fraction, by estimation, is 50 to 55%. Left ventricular ejection fraction by 2D MOD biplane is 52.1 %. The left ventricle has low normal function. The left ventricle has no regional wall motion abnormalities. There is moderate left ventricular hypertrophy. Left ventricular diastolic function could not be evaluated.  2. Right ventricular systolic function is low normal. The right ventricular size is normal. There is normal pulmonary artery systolic pressure. The estimated right ventricular systolic pressure is 04.8 mmHg.  3. The mitral valve is grossly normal. No evidence of mitral valve regurgitation.  4. The aortic valve is tricuspid. Aortic valve regurgitation is not visualized.  5. The inferior vena cava is normal in size with greater than 50% respiratory variability, suggesting right atrial pressure of 3 mmHg. Comparison(s): Changes from prior study are noted. 12/20/2019: LVEF 55-60%. FINDINGS  Left Ventricle: Left ventricular ejection fraction, by estimation, is 50 to 55%. Left ventricular ejection fraction by 2D MOD biplane is 52.1 %. The left ventricle has low normal function. The left ventricle has no regional wall motion abnormalities. The left ventricular internal cavity size was normal in size. There is moderate left ventricular hypertrophy. Left ventricular diastolic function could not be evaluated due to atrial fibrillation. Left ventricular diastolic function could not be evaluated. Right Ventricle: The right ventricular size is normal. No increase in right ventricular wall thickness. Right ventricular systolic function is low normal. There is normal pulmonary artery systolic pressure. The tricuspid regurgitant velocity is 2.66 m/s,  and with an assumed right atrial pressure of 3 mmHg, the estimated right ventricular systolic pressure is 88.9 mmHg. Left Atrium: Left atrial size was normal in size. Right Atrium: Right atrial size was normal in size. Pericardium: There is no evidence  of pericardial effusion. Mitral Valve: The mitral valve is grossly normal. No evidence of mitral valve regurgitation. Tricuspid Valve: The tricuspid valve is grossly normal. Tricuspid valve regurgitation is trivial. Aortic Valve: The aortic valve is tricuspid. Aortic valve regurgitation is not visualized. Aortic valve peak gradient measures 7.6 mmHg. Pulmonic Valve: The pulmonic valve was normal in structure. Pulmonic valve regurgitation is not visualized. Aorta: The aortic root and ascending aorta are structurally normal, with no evidence of dilitation. Venous: The inferior vena cava is normal in size with greater than 50% respiratory variability, suggesting right atrial pressure of 3 mmHg. IAS/Shunts: No atrial level shunt detected by color flow Doppler.  LEFT VENTRICLE PLAX 2D                        Biplane EF (MOD) LVIDd:         4.50 cm         LV Biplane EF:   Left LVIDs:         3.80 cm                          ventricular LV PW:         1.30 cm  ejection LV IVS:        1.30 cm                          fraction by LVOT diam:     2.00 cm                          2D MOD LV SV:         46                               biplane is LV SV Index:   26                               52.1 %. LVOT Area:     3.14 cm                                Diastology                                LV e' medial:    10.40 cm/s LV Volumes (MOD)               LV E/e' medial:  12.6 LV vol d, MOD    83.8 ml       LV e' lateral:   9.25 cm/s A2C:                           LV E/e' lateral: 14.2 LV vol d, MOD    107.0 ml A4C: LV vol s, MOD    39.1 ml A2C: LV vol s, MOD    49.3 ml A4C: LV SV MOD A2C:   44.7 ml LV SV MOD A4C:   107.0 ml LV SV MOD BP:    50.2 ml RIGHT VENTRICLE             IVC RV Basal diam:  2.40 cm     IVC diam: 1.80 cm RV Mid diam:    1.90 cm RV S prime:     10.00 cm/s TAPSE (M-mode): 1.7 cm LEFT ATRIUM             Index        RIGHT ATRIUM           Index LA diam:        2.90 cm 1.65 cm/m   RA  Area:     11.50 cm LA Vol (A2C):   25.8 ml 14.72 ml/m  RA Volume:   20.80 ml  11.87 ml/m LA Vol (A4C):   44.8 ml 25.56 ml/m LA Biplane Vol: 35.6 ml 20.31 ml/m  AORTIC VALVE AV Area (Vmax): 2.23 cm AV Vmax:        138.00 cm/s AV Peak Grad:   7.6 mmHg LVOT Vmax:      97.90 cm/s LVOT Vmean:     77.500 cm/s LVOT VTI:       0.146 m  AORTA Ao Root diam: 3.70 cm Ao Asc diam:  3.40 cm MITRAL VALVE                TRICUSPID VALVE MV Area (PHT): 7.66 cm  TR Peak grad:   28.3 mmHg MV Decel Time: 99 msec      TR Vmax:        266.00 cm/s MV E velocity: 131.00 cm/s MV A velocity: 44.10 cm/s   SHUNTS MV E/A ratio:  2.97         Systemic VTI:  0.15 m                             Systemic Diam: 2.00 cm Lyman Bishop MD Electronically signed by Lyman Bishop MD Signature Date/Time: 10/29/2021/11:08:47 AM    Final     ASSESSMENT AND PLAN: This is an 80 year old male with 1.  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  Sepsis secondary to pneumonia 3.  Anemia secondary to GI blood loss secondary to erosive gastropathy/gastritis/?  MDS 4.  CKD 5.  Acute metabolic encephalopathy/delirium, improving 6.  Diabetes mellitus 7.  7 mm left upper lobe lung nodule, stable 8.  History of CVA 9.  Left knee pain/?  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   -We discussed the spectrum of this disease which ranges from indolent to preleukemic.  We further discussed that we need the final bone marrow biopsy results to determine his prognosis as well as treatment options. -We briefly discussed that treatment may consist of ESA injections versus chemotherapy.  Final recommendations regarding treatment pending bone marrow biopsy results. -Recommend for him to work with therapy to gain back some of his strength. -We will arrange outpatient follow-up once discharge date is known to further discuss the bone marrow biopsy results and  treatment options.  No future appointments.    LOS: 12 days   Mikey Bussing, DNP, AGPCNP-BC, AOCNP 11/09/21  Addendum  I have seen the patient with NP Erasmo Downer. I agree with the assessment and and plan and have edited the notes.   Pt is clinically better, afebrile now. ID work up has been basically negative as I can tell. Dr. Tommy Medal has informed me that the preliminary bone marrow biopsy is suspicious for MDS, but more work up needed to finalize the diagnosis. The prognosis of MDS is related to the degree of cytopenias, percentage of blasts in marrow and cytogenetics mutation results. It may take a few weeks to have all results back. I discussed with pt and his son today, but plan to see him back in a few week when I have the final results. Treatment will be determined when we have the final results, based on the risk of MDS. No additional work up needed at this point. Continue supportive care. He is deconditioned, I encouraged him to participate PT/OT. Please call us back when he is ready to be discharged so I can arrange his f/u with me.   Truitt Merle  11/09/2021

## 2021-11-09 NOTE — Progress Notes (Signed)
Physical Therapy Treatment Patient Details Name: Eugene Cooper MRN: [id number] DOB: December 06, 1941 Today's Date: 11/09/2021   History of Present Illness 80 yo admitted 11/20 with AMS, weakness, tachycardia, hyperkalemia and symptomatic anemia. EGD 11/21. PMhx: DM, CVA, HTN, HLD, LBBB, gout    PT Comments    Pt was seen for progression to standing and getting to chair with one person to assist but does need repetitive dense cues for sequence, for direction and to stop him from trying to sit early.  The translator is asking for repetition on his answers, low volume and some speech is difficult to understand.  Pt is in attendance of his son, who will help with caregiving.  Will still recommend him to SNF but anticipate his family will insist on sending him home.  Son was able to watch with PT moving him to the chair and to hear all exercise instructions from PT via translation as well. Follow acutely to increase safety with augmenting strength, balance and control of use of walker as well as understanding the sequence of movement.    Recommendations for follow up therapy are one component of a multi-disciplinary discharge planning process, led by the attending physician.  Recommendations may be updated based on patient status, additional functional criteria and insurance authorization.  Follow Up Recommendations  Skilled nursing-short term rehab (<3 hours/day)     Assistance Recommended at Discharge Frequent or [facility] bed    Recommendations for Other Services       Precautions / Restrictions Precautions Precautions: Fall Precaution Comments: gouty knee pain previously Restrictions Weight Bearing Restrictions: No     Mobility  Bed Mobility Overal bed mobility: Needs Assistance Bed Mobility: Sidelying to Sit Rolling: Supervision Sidelying to sit: Min assist            Transfers Overall transfer level: Needs  assistance   Transfers: Sit to/from Stand Sit to Stand: Min assist;Mod assist Stand pivot transfers: Mod assist   Step pivot transfers: Mod assist     General transfer comment: mod to power up and mod to help finish the pivot    Ambulation/Gait                   Stairs             Wheelchair Mobility    Modified Rankin (Stroke Patients Only)       Balance Overall balance assessment: Needs assistance Sitting-balance support: Feet supported Sitting balance-Leahy Scale: Fair     Standing balance support: Bilateral upper extremity supported;During functional activity Standing balance-Leahy Scale: Poor                              Cognition Arousal/Alertness: Awake/alert Behavior During Therapy: WFL for tasks assessed/performed;Flat affect Overall Cognitive Status: Impaired/Different from baseline Area of Impairment: Following commands;Awareness;Problem solving;Safety/judgement                 Orientation Level: Situation Current Attention Level: Selective Memory: Decreased short-term memory;Decreased recall of precautions Following Commands: Follows one step commands with increased time Safety/Judgement: Decreased awareness of deficits Awareness: Intellectual Problem Solving: Slow processing;Requires verbal cues;Requires tactile cues General Comments: requires dense cues to pivot to chair on walker        Exercises General Exercises - Lower Extremity Ankle Circles/Pumps: AROM;5 reps Quad Sets: AROM;AAROM;10 reps Gluteal Sets: AROM;10 reps Long Arc Quad: AROM;Strengthening;10 reps Heel Slides: AROM;Strengthening;10 reps Hip ABduction/ADduction: AROM;AAROM;10 reps  General Comments General comments (skin integrity, edema, etc.): pt was assisted to chair and then through exercises, translation is difficult as he is not understandable all the time, has to be asked to repeat answers      Pertinent Vitals/Pain Pain Assessment:  No/denies pain Breathing: normal Negative Vocalization: none Facial Expression: smiling or inexpressive Body Language: relaxed Consolability: no need to console PAINAD Score: 0 Facial Expression: Relaxed, neutral Body Movements: Absence of movements Muscle Tension: Relaxed    Home Living                          Prior Function            PT Goals (current goals can now be found in the care plan section) Acute Rehab PT Goals Patient Stated Goal: to go home with family Progress towards PT goals: Progressing toward goals    Frequency    Min 3X/week      PT Plan Current plan remains appropriate    Co-evaluation              AM-PAC PT "6 Clicks" Mobility   Outcome Measure  Help needed turning from your back to your side while in a flat bed without using bedrails?: A Little Help needed moving from lying on your back to sitting on the side of a flat bed without using bedrails?: A Little Help needed moving to and from a bed to a chair (including a wheelchair)?: A Lot Help needed standing up from a chair using your arms (e.g., wheelchair or bedside chair)?: A Lot Help needed to walk in hospital room?: Total Help needed climbing 3-5 steps with a railing? : Total 6 Click Score: 12    End of Session Equipment Utilized During Treatment: Gait belt Activity Tolerance: Patient limited by fatigue;Treatment limited secondary to medical complications (Comment) Patient left: in chair;with call bell/phone within reach;with chair alarm set;with family/visitor present Nurse Communication: Mobility status PT Visit Diagnosis: Other abnormalities of gait and mobility (R26.89);Difficulty in walking, not elsewhere classified (R26.2);Muscle weakness (generalized) (M62.81);History of falling (Z91.81)     Time: 2774-1287 PT Time Calculation (min) (ACUTE ONLY): 28 min  Charges:  $Therapeutic Exercise: 8-22 mins $Therapeutic Activity: 8-22 mins               Ramond Dial 11/09/2021, 7:16 PM  Mee Hives, PT PhD Acute Rehab Dept. Number: [facility]

## 2021-11-10 DIAGNOSIS — D62 Acute posthemorrhagic anemia: Secondary | ICD-10-CM | POA: Diagnosis not present

## 2021-11-10 DIAGNOSIS — A419 Sepsis, unspecified organism: Secondary | ICD-10-CM | POA: Diagnosis not present

## 2021-11-10 DIAGNOSIS — R509 Fever, unspecified: Secondary | ICD-10-CM | POA: Diagnosis not present

## 2021-11-10 LAB — BASIC METABOLIC PANEL
Anion gap: 7 (ref 5–15)
BUN: <5 mg/dL — ABNORMAL LOW (ref 8–23)
CO2: 25 mmol/L (ref 22–32)
Calcium: 8.2 mg/dL — ABNORMAL LOW (ref 8.9–10.3)
Chloride: 109 mmol/L (ref 98–111)
Creatinine, Ser: 0.88 mg/dL (ref 0.61–1.24)
GFR, Estimated: >60 mL/min (ref >60)
Glucose, Bld: 99 mg/dL (ref 70–99)
Potassium: 3.6 mmol/L (ref 3.5–5.1)
Sodium: 141 mmol/L (ref 135–145)

## 2021-11-10 LAB — CBC
HCT: 27.1 % — ABNORMAL LOW (ref 39.0–52.0)
Hemoglobin: 9.2 g/dL — ABNORMAL LOW (ref 13.0–17.0)
MCH: 31 pg (ref 26.0–34.0)
MCHC: 33.9 g/dL (ref 30.0–36.0)
MCV: 91.2 fL (ref 80.0–100.0)
Platelets: 75 10*3/uL — ABNORMAL LOW (ref 150–400)
RBC: 2.97 MIL/uL — ABNORMAL LOW (ref 4.22–5.81)
RDW: 15.2 % (ref 11.5–15.5)
WBC: 2.8 10*3/uL — ABNORMAL LOW (ref 4.0–10.5)
nRBC: 0 % (ref 0.0–0.2)

## 2021-11-10 LAB — GLUCOSE, CAPILLARY
Glucose-Capillary: 98 mg/dL (ref 70–99)
Glucose-Capillary: 97 mg/dL (ref 70–99)

## 2021-11-10 LAB — MAGNESIUM: Magnesium: 1.3 mg/dL — ABNORMAL LOW (ref 1.7–2.4)

## 2021-11-10 MED ORDER — DICLOFENAC SODIUM 1 % EX GEL: 2.0000 g | Freq: Four times a day (QID) | CUTANEOUS | 1 refills | Status: AC

## 2021-11-10 MED ORDER — POTASSIUM CHLORIDE CRYS ER 20 MEQ PO TBCR
40.0000 meq | EXTENDED_RELEASE_TABLET | Freq: Once | ORAL | Status: AC
  Administered 2021-11-10: 40 meq via ORAL
  Filled 2021-11-10: qty 2

## 2021-11-10 MED ORDER — COLCHICINE 0.6 MG PO TABS
0.6000 mg | ORAL_TABLET | Freq: Every day | ORAL | 0 refills | Status: DC
Start: 2021-11-11 — End: 2021-12-06

## 2021-11-10 MED ORDER — MAGNESIUM SULFATE 2 GM/50ML IV SOLN
2.0000 g | Freq: Once | INTRAVENOUS | Status: AC
  Administered 2021-11-10: 2 g via INTRAVENOUS
  Filled 2021-11-10: qty 50

## 2021-11-10 MED ORDER — MAGNESIUM OXIDE -MG SUPPLEMENT 400 (240 MG) MG PO TABS: 200.0000 mg | ORAL_TABLET | Freq: Two times a day (BID) | ORAL | 0 refills | Status: AC

## 2021-11-10 MED ORDER — PANTOPRAZOLE SODIUM 40 MG PO TBEC: 40.0000 mg | DELAYED_RELEASE_TABLET | Freq: Two times a day (BID) | ORAL | 1 refills | Status: AC

## 2021-11-10 MED ORDER — MAGNESIUM OXIDE -MG SUPPLEMENT 400 (240 MG) MG PO TABS
200.0000 mg | ORAL_TABLET | Freq: Two times a day (BID) | ORAL | Status: DC
  Administered 2021-11-10: 200 mg via ORAL
  Filled 2021-11-10: qty 1

## 2021-11-10 NOTE — Progress Notes (Signed)
Per tele patient had 7 beats of vtach at [place]. Strips saved to patient chart and MD notified. Patient in no signs of distress at this time. Will continue to monitor.

## 2021-11-10 NOTE — Discharge Summary (Signed)
Physician Discharge Summary  Eugene Cooper GEX:[id number] DOB: 10-24-1941 DOA: 10/28/2021  PCP: Emelia Loron, NP  Admit date: 10/28/2021 Discharge date: 11/10/2021  Admitted From: Home  Disposition: Home   Recommendations for Outpatient Follow-up:  Follow up with PCP in 1-2 weeks Please obtain BMP/CBC in one week Close follow up with hematology for further result of BM biopsy. Further transfusion as needed/  Needs CT chest in 6 moth to follow lung nodule.   Home Health: yes.   Discharge Condition: Stable.  CODE STATUS: Full Code Diet recommendation: Heart Healthy   Brief/Interim Summary: 80 year old Spanish-speaking male with past medical history significant for CVA, LBBB, diabetes type 2, CKD stage IIIa, HLD, ambulatory dysfunction who presents with altered mental status, poor oral intake, bilateral lower extremity pain, admitted for acute blood loss anemia, sinus tachycardia, AKI, hyperkalemia and severe sepsis secondary to left lower lobe pneumonia.  Initially there was concern about a fib  which was ruled out by cardiology.  Hemoglobin on admission 6.4, occult blood positive, EGD revealed gastritis.  Patient creatinine 2.5, potassium 6.2.  Chest x-ray right suprahilar opacity.  CT head negative.  CT abdomen pelvis with concern for fluid overload pulm consolidation.  CT chest with patchy consolidation within the left lower lobe and left upper lobe nodule.   Patient was transfused 1 unit of packed red blood cell with appropriate response.  Hemoglobin had remained stable.  He was initially on vancomycin and cefepime but subsequently antibiotics de-escalated to IV Unasyn and doxycycline.  He was also found to have acute metabolic encephalopathy, confusion.  He is spiking fever 102, tachycardia hypotension and leukopenia if 11/25.  ID consulted and recommended MRI of the right knee, repeat CT chest, hematology consult and bone marrow biopsy.  MRI and repeated CT chest unrevealing.  Bone  marrow biopsy schedule for 12/1.   11/30; hypotensive systolic blood pressure in the 50--responded to IV fluids.  Last blood pressure 100 range. Resolved.    1-Sepsis secondary to pneumonia: POA Presented with tachycardia, tachypnea, AKI, altered mental status, lactic acid 6.8. CT chest concerning for left upper lobe and left lower lobe consolidation. MRI right knee with arthritic changes and quadricep tendon injury. Last fever 11/25. HIV and RPR unrevealing.  Blood cultures no growth.     2-Fever unknown etiology: -Spiked fever 11/25 -Completed treatment for pneumonia. -ID recommended bone marrow biopsy -Completed antibiotics. -QuantiFERON for TB negative.  Cryptococcal antigen negative.  Blastomycosis negative, histoplasma antigen pending. Afebrile.    3-Hypotension: -Recurrent hypotension 11/30. -TTE on admission normal left ventricular ejection fraction -Patient became hypotensive 11/30.  Systolic blood pressure dropped to the 50s.  He was symptomatic reported lightheadedness. -Blood pressure improved with 1 L of IV fluids. -Repeating CBC, 1 unit of packed red blood cells ordered. -cortisol level 17, not low. Lactic acid normal. Troponin normal.  Suspect related to dehydration, repeated hb in the afternoon with hemoconcentration. BP improved with IV fluids. BP stable.    4-ABL/IDA/GI bleed/erosive gastropathy/gastritis - EGD showed erosive gastropathy and gastritis -Received 1 unit of packed red blood cell. -He will need colonoscopy when able to drink prep -Oral Protonix twice daily for 8 weeks.   5-AKI on CKD stage IIIa: Secondary to sepsis, diuretic. Resolved with IV fluids.   6-Acute metabolic encephalopathy/delirium History of CVA CT head, B12 ammonia and RPR unrevealing. Mental status improving   7-Diabetes type 2 uncontrolled hyperglycemia: Hemoglobin A1c 7.5 -Continue with a sliding scale insulin -Treated  with basal insulin 5 units daily in the  hospital   Resume oral meds at discharge.    8-Pancytopenia -Hematology oncology and ID following -MM panel pending -Underwent bone marrow biopsy 12/1, results consistent with MDS. Oncology will follow up on patient today.    9-7 mm LUL nodule: He will need repeat CT chest in 6-12.   Hyperkalemia: Resolved  Hypomagnesemia:  Replaced IV prior to discharge. Had SVT overnight. Discharge with oral magnesium.   Left knee pain/tenderness and stiffness: MRI with severe arthritis changes standing intermediate grade partial width tearing of the central and lateral fibers of the distal quadriceps tendon at its insertion on the patella. Pain improving.  Continue with supportive care.  History of gout: Continue with colchicine Schedule tylenol for pain.      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   Altered mental status   Knee pain   Sepsis with acute renal failure and septic shock (HCC)   Symptomatic anemia   Pneumonia of left lower lobe due to infectious organism   Pancytopenia (HCC)   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HCC)   Neutropenic fever (HCC)   Malnutrition of moderate degree    Discharge Instructions  Discharge Instructions     Diet - low sodium heart healthy   Complete by: As directed    Increase activity slowly   Complete by: As directed       Allergies as of 11/10/2021   No Known Allergies      Medication List     STOP taking these medications    aspirin 325 MG EC  tablet   aspirin 81 MG EC tablet   gabapentin 100 MG capsule Commonly known as: NEURONTIN   glipiZIDE 10 MG 24 hr tablet Commonly known as: GLUCOTROL XL   HYDROcodone-acetaminophen 5-325 MG tablet Commonly known as: NORCO/VICODIN   ibuprofen 200 MG tablet Commonly known as: ADVIL   lisinopril 5 MG tablet Commonly known as: ZESTRIL   pioglitazone 15 MG tablet Commonly known as: ACTOS   senna-docusate 8.6-50 MG tablet Commonly known as: Senokot-S   topiramate 25 MG tablet Commonly known as: TOPAMAX   triamterene-hydrochlorothiazide 37.5-25 MG tablet Commonly known as: MAXZIDE-25       TAKE these medications    Accu-Chek Softclix Lancets lancets USE UPTO FOUR TIMES DAILY AS DIRECTED   acetaminophen 325 MG tablet Commonly known as: TYLENOL Take 1-2 tablets (325-650 mg total) by mouth every 4 (four) hours as needed for mild pain.   allopurinol 300 MG tablet Commonly known as: ZYLOPRIM Take 1 tablet (300 mg total) by mouth daily.   atorvastatin 40 MG tablet Commonly known as: LIPITOR Take 1 tablet (40 mg total) by mouth daily at 6 PM.   blood glucose meter kit and supplies Kit Dispense based on patient and insurance preference. Use up to four times daily as directed. (FOR ICD-9 250.00, 250.01).   colchicine 0.6 MG tablet Take 1 tablet (0.6 mg total) by mouth daily. Start taking on: November 11, 2021   diclofenac Sodium 1 % Gel  Commonly known as: VOLTAREN Apply 2 g topically 4 (four) times daily.   magnesium oxide 400 (240 Mg) MG tablet Commonly known as: MAG-OX Take 0.5 tablets (200 mg total) by mouth 2 (two) times daily.   metFORMIN 1000 MG tablet Commonly known as: GLUCOPHAGE Take 1,000 mg by mouth 2 (two) times daily.   multivitamin with minerals Tabs tablet Take 1 tablet by mouth daily.   Muscle Rub 10-15 % Crea Apply 1 application topically 2 (two) times daily as needed for muscle pain. To lower back   pantoprazole 40 MG tablet Commonly known  as: Protonix Take 1 tablet (40 mg total) by mouth 2 (two) times daily.               Durable Medical Equipment  (From admission, onward)           Start     Ordered   11/08/21 1331  For home use only DME 3 n 1  Once        11/08/21 1330   11/07/21 1246  For home use only DME Hospital bed  Once       Question Answer Comment  Length of Need 6 Months   The above medical condition requires: Patient requires the ability to reposition frequently   Head must be elevated greater than: 45 degrees   Bed type Semi-electric      11/07/21 1245            Follow-up Information     [facility] Follow up.   Specialty: Home Health Services Why: HH-PT/OT/RN arranged. They will contact you to schedule apt Contact information: [place] [address] [phone]         [facility] Follow up.   Why: hospital bed and 3&1 Contact information: [address] [place] [address] [phone]         Truitt Merle, MD Follow up in 1 week(s).   Specialties: Hematology, Oncology Contact information: [place] [address] [phone]                No Known Allergies  Consultations: ID Oncology    Procedures/Studies: CT ABDOMEN PELVIS WO CONTRAST  Result Date: 10/30/2021 CLINICAL DATA:  Fever of unknown origin EXAM: CT ABDOMEN AND PELVIS WITHOUT CONTRAST TECHNIQUE: Multidetector CT imaging of the abdomen and pelvis was performed following the standard protocol without IV contrast. COMPARISON:  January 2021 FINDINGS: Lower chest: Partially imaged left lower lobe consolidative opacity. Additional bibasilar atelectasis. Hepatobiliary: No focal liver abnormality is seen. No gallstones, gallbladder wall thickening, or biliary dilatation. Pancreas: Unremarkable. Spleen: Unremarkable. Adrenals/Urinary Tract: Adrenals, kidneys, and bladder are unremarkable. Stomach/Bowel: Stomach is within normal  limits. Bowel is normal in caliber. Vascular/Lymphatic: Mild aortic atherosclerosis. No enlarged lymph nodes. Reproductive: Unremarkable. Other: No free fluid.  Abdominal wall is unremarkable. Musculoskeletal: Lumbar spine degenerative changes. No acute osseous abnormality. IMPRESSION: Partially imaged consolidative opacity left lower lobe may reflect atelectasis or pneumonia. No acute abnormality in the abdomen or pelvis. Electronically Signed   By: Macy Mis M.D.   On: 10/30/2021 14:06   DG Chest 2 View  Result Date: 10/30/2021 CLINICAL DATA:  Fever EXAM: CHEST - 2 VIEW COMPARISON:  10/28/2021 FINDINGS: Transverse diameter of heart is increased. There are no signs of pulmonary edema or focal pulmonary consolidation. In the immediate previous study there was a possible nodular density described in the medial right upper lung fields. In the current  study, there is a cardiac monitoring lead superimposed over the region of possible nodule seen in the previous examination limiting evaluation for a nodule. There is no pleural effusion or pneumothorax. IMPRESSION: Cardiomegaly. There are no signs of pulmonary edema or focal pulmonary consolidation. In the previous study there was a possible nodular density in the medial right upper lung fields which is partly obscured by cardiac monitoring lead limiting comparison. Short-term follow-up chest radiographs and CT if warranted should be considered. Electronically Signed   By: Elmer Picker M.D.   On: 10/30/2021 08:38   DG Knee 1-2 Views Right  Result Date: 11/01/2021 CLINICAL DATA:  Pain EXAM: RIGHT KNEE - 1-2 VIEW COMPARISON:  None. FINDINGS: Enthesopathic changes off the superior patella. No joint effusion. No fracture. No other acute abnormalities. IMPRESSION: Enthesopathic changes off the superior patella. No other abnormalities. Electronically Signed   By: Dorise Bullion III M.D.   On: 11/01/2021 16:26   CT HEAD WO CONTRAST (5MM)  Result Date:  10/28/2021 CLINICAL DATA:  Patient states he cannot: Self up since yesterday. EXAM: CT HEAD WITHOUT CONTRAST CT CERVICAL SPINE WITHOUT CONTRAST TECHNIQUE: Multidetector CT imaging of the head and cervical spine was performed following the standard protocol without intravenous contrast. Multiplanar CT image reconstructions of the cervical spine were also generated. COMPARISON:  CT scan of the brain December 19, 2019. FINDINGS: CT HEAD FINDINGS Brain: Chronic white matter changes are stable. No evidence of acute infarction, hemorrhage, hydrocephalus, extra-axial collection or mass lesion/mass effect. Vascular: Calcified atherosclerosis in the intracranial carotids. Skull: Normal. Negative for fracture or focal lesion. Sinuses/Orbits: No acute finding. Other: None. CT CERVICAL SPINE FINDINGS Alignment: Normal. Skull base and vertebrae: No acute fracture. No primary bone lesion or focal pathologic process. Soft tissues and spinal canal: No prevertebral fluid or swelling. No visible canal hematoma. Disc levels:  Mild multilevel degenerative disc disease. Upper chest: Negative. Other: No other abnormalities. IMPRESSION: 1. No acute intracranial abnormalities. 2. No fracture or traumatic malalignment in the cervical spine. Electronically Signed   By: Dorise Bullion III M.D.   On: 10/28/2021 19:49   CT CHEST W CONTRAST  Result Date: 11/04/2021 CLINICAL DATA:  Fever. EXAM: CT CHEST WITH CONTRAST TECHNIQUE: Multidetector CT imaging of the chest was performed during intravenous contrast administration. CONTRAST:  79m OMNIPAQUE IOHEXOL 300 MG/ML  SOLN COMPARISON:  October 31, 2021 FINDINGS: Cardiovascular: There is mild calcification of the aortic arch, without evidence of aortic aneurysm or dissection. Normal heart size with mild coronary artery calcification. No pericardial effusion. Mediastinum/Nodes: There is mild AP window, right paratracheal and right hilar lymphadenopathy. There is stable marked severity  narrowing of the right mainstem bronchus (axial CT image 51, CT series 4). The thyroid gland and esophagus demonstrate no significant findings. Lungs/Pleura: Mild areas of atelectasis are seen within the inferior aspect of the left upper lobe and posterior aspects of the bilateral lung bases. This is mildly decreased in severity within the superior segment of the left upper lobe when compared to the prior study and stable in severity throughout the remaining portions of the bilateral lung bases and left upper lobe. A stable 7 mm left upper lobe noncalcified lung nodule is seen (axial CT image 91, CT series 4). Small, stable bilateral pleural effusions are noted. No pneumothorax is identified. Upper Abdomen: No acute abnormality. Musculoskeletal: No chest wall abnormality. No acute or significant osseous findings. IMPRESSION: 1. Mild areas of left upper lobe and bilateral lower lobe atelectasis and/or infiltrate, mildly decreased in  severity within the left lower lobe since the prior study. 2. Small, stable bilateral pleural effusions. 3. Stable 7 mm left upper lobe noncalcified lung nodule. Non-contrast chest CT at 6-12 months is recommended. If the nodule is stable at time of repeat CT, then future CT at 18-24 months (from today's scan) is considered optional for low-risk patients, but is recommended for high-risk patients. This recommendation follows the consensus statement: Guidelines for Management of Incidental Pulmonary Nodules Detected on CT Images: From the Fleischner Society 2017; Radiology 2017; 284:228-243. 4. Stable marked severity narrowing of the right mainstem bronchus. 5. Mild coronary artery disease. 6. Aortic atherosclerosis. Aortic Atherosclerosis (ICD10-I70.0). Electronically Signed   By: Virgina Norfolk M.D.   On: 11/04/2021 20:23   CT CHEST W CONTRAST  Result Date: 10/31/2021 CLINICAL DATA:  Fever of unknown origin, consolidation left lung base, pulmonary nodule EXAM: CT CHEST WITH  CONTRAST TECHNIQUE: Multidetector CT imaging of the chest was performed during intravenous contrast administration. CONTRAST:  40m OMNIPAQUE IOHEXOL 300 MG/ML  SOLN COMPARISON:  10/30/2021, 10/28/2021 FINDINGS: Cardiovascular: The heart is unremarkable without pericardial effusion. No evidence of thoracic aortic aneurysm or dissection. Moderate atherosclerosis of the aortic arch. Mediastinum/Nodes: Borderline enlarged 10 mm short axis lymph nodes are seen within the as ago esophageal recess. No pathologic mediastinal, hilar, or axillary adenopathy. Thyroid gland, trachea, and esophagus demonstrate no significant findings. Lungs/Pleura: Dependent hypoventilatory changes are seen at the lung bases. There is patchy consolidation within the superior segment left lower lobe and lingula, which may be hypoventilatory or infectious. Trace bilateral pleural effusions are noted. There is no pneumothorax. A single solid 7 mm nodule is seen within the lingular segment left upper lobe, reference image 100/4. No other pulmonary nodules or masses. Specifically, the right upper lobe density seen on recent chest x-ray likely corresponds to overlapping shadows or vascular shadow. Upper Abdomen: No acute abnormality. Musculoskeletal: No acute or destructive bony lesions. Reconstructed images demonstrate no additional findings. IMPRESSION: 1. Patchy consolidation within the superior segment left lower lobe and left upper lobe, which may reflect bronchopneumonia in a patient with a history of fever. Other areas of dependent consolidations within the lower lobes along the posterior costophrenic angles are likely hypoventilatory. 2. Trace bilateral pleural effusions. 3. 7 mm left solid pulmonary nodule within the upper lobe. Recommend a non-contrast Chest CT at 6-12 months. If patient is high risk for malignancy, recommend an additional non-contrast Chest CT at 18-24 months; if patient is low risk for malignancy a non-contrast Chest CT  at 18-24 months is optional. These guidelines do not apply to immunocompromised patients and patients with cancer. Follow up in patients with significant comorbidities as clinically warranted. For lung cancer screening, adhere to Lung-RADS guidelines. Reference: Radiology. 2017; 284(1):228-43. 4.  Aortic Atherosclerosis (ICD10-I70.0). Electronically Signed   By: MRanda NgoM.D.   On: 10/31/2021 15:53   CT Cervical Spine Wo Contrast  Result Date: 10/28/2021 CLINICAL DATA:  Patient states he cannot: Self up since yesterday. EXAM: CT HEAD WITHOUT CONTRAST CT CERVICAL SPINE WITHOUT CONTRAST TECHNIQUE: Multidetector CT imaging of the head and cervical spine was performed following the standard protocol without intravenous contrast. Multiplanar CT image reconstructions of the cervical spine were also generated. COMPARISON:  CT scan of the brain December 19, 2019. FINDINGS: CT HEAD FINDINGS Brain: Chronic white matter changes are stable. No evidence of acute infarction, hemorrhage, hydrocephalus, extra-axial collection or mass lesion/mass effect. Vascular: Calcified atherosclerosis in the intracranial carotids. Skull: Normal. Negative for fracture or focal  lesion. Sinuses/Orbits: No acute finding. Other: None. CT CERVICAL SPINE FINDINGS Alignment: Normal. Skull base and vertebrae: No acute fracture. No primary bone lesion or focal pathologic process. Soft tissues and spinal canal: No prevertebral fluid or swelling. No visible canal hematoma. Disc levels:  Mild multilevel degenerative disc disease. Upper chest: Negative. Other: No other abnormalities. IMPRESSION: 1. No acute intracranial abnormalities. 2. No fracture or traumatic malalignment in the cervical spine. Electronically Signed   By: Dorise Bullion III M.D.   On: 10/28/2021 19:49   MR KNEE LEFT W WO CONTRAST  Result Date: 11/05/2021 CLINICAL DATA:  Septic arthritis suspected, knee, xray done EXAM: MRI OF THE LEFT KNEE WITHOUT AND WITH CONTRAST  TECHNIQUE: Multiplanar, multisequence MR imaging of the left knee was performed both before and after administration of intravenous contrast. CONTRAST:  6.4m GADAVIST GADOBUTROL 1 MMOL/ML IV SOLN COMPARISON:  None. FINDINGS: MENISCI Medial: Nondisplaced degenerative tearing of the posterior horn and body of the medial meniscus. Lateral: Intrasubstance degenerative signal without definitive tear. LIGAMENTS Cruciates: ACL and PCL are intact. Collaterals: Medial collateral ligament is intact. Lateral collateral ligament complex is intact. CARTILAGE Patellofemoral: Mild chondrosis. There is a 2 mm intermediate grade focal defect along the medial trochlea (series 3, image 14). Medial: Overall moderate chondrosis with intermediate grade chondral fissuring along the weight-bearing medial femoral condyle and partial-thickness cartilage loss along the tibial plateau. Lateral:  Mild chondrosis.  No focal defect. JOINT: Small joint effusion with appropriately thin synovial enhancement. POPLITEAL FOSSA: No significant Baker cyst. EXTENSOR MECHANISM: There is intermediate grade, partial width tearing of the central and lateral fibers of the distal quadriceps tendon at its insertion on the patella, and with reactive adjacent edema/cystic change in the patella. The patella tendon appears mildly lax with insertional patellar tendinosis at the patella. BONES: There is no acute fracture or dislocation. There is mild bony edema signal within the fibular head. The cortex is intact. There is no aggressive osseous lesion. Other: There is intramuscular edema signal within the distal vastus medialis and lateralis muscles which is likely reactive in association with the quadriceps tendon tearing. There is also intramuscular edema signal within the proximal aspect of the anterior compartment muscles in the lower leg, which is likely reactive as well. No focal fluid collection. No abnormal enhancement. IMPRESSION: Tricompartment  osteoarthritis, worst in the medial compartment with nondisplaced degenerative tearing of the posterior horn and body of the medial meniscus. Moderate medial compartment chondrosis with intermediate grade chondral fissuring and partial-thickness cartilage loss along the weight-bearing surfaces. Mild patellofemoral chondrosis with 2 mm focal chondral defect along the medial trochlea. Intermediate grade, partial width tearing of the central and lateral fibers of the distal quadriceps tendon at its insertion on the patella. Reactive intramuscular edema within the distal vastus medialis and lateralis muscles as well as the proximal anterior compartment muscles in the lower leg. Moderate proximal insertional patellar tendinosis. Small joint effusion, which is likely reactive to the above findings. No convincing findings to suggest septic arthritis or osteomyelitis. Electronically Signed   By: JMaurine SimmeringM.D.   On: 11/05/2021 08:24   DG Chest Port 1 View  Result Date: 10/28/2021 CLINICAL DATA:  Sepsis EXAM: PORTABLE CHEST 1 VIEW COMPARISON:  04/25/2016 FINDINGS: The lungs are symmetrically expanded. No focal pulmonary infiltrate identified. There is a nodular opacity within the right suprahilar region which may represent vascular shadow or a focal pulmonary nodule, but is not we well characterized ll on this examination. This does, however, appear more prominent  than on prior examination. No pneumothorax or pleural effusion. Cardiac size is within normal limits. No acute bone abnormality. IMPRESSION: No radiographic evidence of acute cardiopulmonary disease. Nodular opacity within the right suprahilar region, indeterminate. Standard two view chest radiograph may be helpful in excluding and artifactual opacity. Electronically Signed   By: Fidela Salisbury M.D.   On: 10/28/2021 19:07   CT BONE MARROW BIOPSY & ASPIRATION  Result Date: 11/08/2021 INDICATION: Fever of unknown origin EXAM: CT BONE MARROW BIOPSY AND  ASPIRATION MEDICATIONS: None. ANESTHESIA/SEDATION: Moderate (conscious) sedation was employed during this procedure. A total of Versed 1.5 mg and Fentanyl 75 mcg was administered intravenously. Moderate Sedation Time: 11 minutes. The patient's level of consciousness and vital signs were monitored continuously by radiology nursing throughout the procedure under my direct supervision. FLUOROSCOPY TIME:  N/a COMPLICATIONS: None immediate. PROCEDURE: Informed written consent was obtained from the patient after a thorough discussion of the procedural risks, benefits and alternatives. All questions were addressed. Maximal Sterile Barrier Technique was utilized including caps, mask, sterile gowns, sterile gloves, sterile drape, hand hygiene and skin antiseptic. A timeout was performed prior to the initiation of the procedure. The patient was placed prone on the CT exam table. Limited CT of the pelvis was performed for planning purposes. Skin entry site was marked, and the overlying skin was prepped and draped in the standard sterile fashion. Local analgesia was obtained with 1% lidocaine. Using CT guidance, an 11 gauge needle was advanced just deep to the cortex of the right posterior ilium. Subsequently, bone marrow aspiration and core biopsy were performed. Specimens were submitted to lab/pathology for handling. Hemostasis was achieved with manual pressure, and a clean dressing was placed. The patient tolerated the procedure well without immediate complication. IMPRESSION: Successful CT-guided bone marrow aspiration and core biopsy of the right posterior ilium. Electronically Signed   By: Albin Felling M.D.   On: 11/08/2021 10:33   ECHOCARDIOGRAM COMPLETE  Result Date: 10/29/2021    ECHOCARDIOGRAM REPORT   Patient Name:   WILLMAR STOCKINGER Date of Exam: 10/29/2021 Medical Rec #:  [id number]          Height:       62.0 in Accession #:    [id number]         Weight:       163.0 lb Date of Birth:  [birth year]           BSA:          1.753 m Patient Age:    80 years           BP:           108/61 mmHg Patient Gender: M                  HR:           117 bpm. Exam Location:  Inpatient Procedure: 2D Echo, Cardiac Doppler and Color Doppler Indications:    Afib  History:        Patient has prior history of Echocardiogram examinations, most                 recent 12/20/2019. Stroke; Risk Factors:Diabetes and                 Hypertension.  Sonographer:    Jyl Heinz Referring Phys: [id number] [place] T TU IMPRESSIONS  1. Left ventricular ejection fraction, by estimation, is 50 to 55%. Left ventricular ejection fraction by 2D MOD biplane is 52.1 %. The  left ventricle has low normal function. The left ventricle has no regional wall motion abnormalities. There is moderate left ventricular hypertrophy. Left ventricular diastolic function could not be evaluated.  2. Right ventricular systolic function is low normal. The right ventricular size is normal. There is normal pulmonary artery systolic pressure. The estimated right ventricular systolic pressure is 79.8 mmHg.  3. The mitral valve is grossly normal. No evidence of mitral valve regurgitation.  4. The aortic valve is tricuspid. Aortic valve regurgitation is not visualized.  5. The inferior vena cava is normal in size with greater than 50% respiratory variability, suggesting right atrial pressure of 3 mmHg. Comparison(s): Changes from prior study are noted. 12/20/2019: LVEF 55-60%. FINDINGS  Left Ventricle: Left ventricular ejection fraction, by estimation, is 50 to 55%. Left ventricular ejection fraction by 2D MOD biplane is 52.1 %. The left ventricle has low normal function. The left ventricle has no regional wall motion abnormalities. The left ventricular internal cavity size was normal in size. There is moderate left ventricular hypertrophy. Left ventricular diastolic function could not be evaluated due to atrial fibrillation. Left ventricular diastolic function could not be evaluated.  Right Ventricle: The right ventricular size is normal. No increase in right ventricular wall thickness. Right ventricular systolic function is low normal. There is normal pulmonary artery systolic pressure. The tricuspid regurgitant velocity is 2.66 m/s,  and with an assumed right atrial pressure of 3 mmHg, the estimated right ventricular systolic pressure is 92.1 mmHg. Left Atrium: Left atrial size was normal in size. Right Atrium: Right atrial size was normal in size. Pericardium: There is no evidence of pericardial effusion. Mitral Valve: The mitral valve is grossly normal. No evidence of mitral valve regurgitation. Tricuspid Valve: The tricuspid valve is grossly normal. Tricuspid valve regurgitation is trivial. Aortic Valve: The aortic valve is tricuspid. Aortic valve regurgitation is not visualized. Aortic valve peak gradient measures 7.6 mmHg. Pulmonic Valve: The pulmonic valve was normal in structure. Pulmonic valve regurgitation is not visualized. Aorta: The aortic root and ascending aorta are structurally normal, with no evidence of dilitation. Venous: The inferior vena cava is normal in size with greater than 50% respiratory variability, suggesting right atrial pressure of 3 mmHg. IAS/Shunts: No atrial level shunt detected by color flow Doppler.  LEFT VENTRICLE PLAX 2D                        Biplane EF (MOD) LVIDd:         4.50 cm         LV Biplane EF:   Left LVIDs:         3.80 cm                          ventricular LV PW:         1.30 cm                          ejection LV IVS:        1.30 cm                          fraction by LVOT diam:     2.00 cm                          2D MOD LV SV:  46                               biplane is LV SV Index:   26                               52.1 %. LVOT Area:     3.14 cm                                Diastology                                LV e' medial:    10.40 cm/s LV Volumes (MOD)               LV E/e' medial:  12.6 LV vol d, MOD    83.8 ml        LV e' lateral:   9.25 cm/s A2C:                           LV E/e' lateral: 14.2 LV vol d, MOD    107.0 ml A4C: LV vol s, MOD    39.1 ml A2C: LV vol s, MOD    49.3 ml A4C: LV SV MOD A2C:   44.7 ml LV SV MOD A4C:   107.0 ml LV SV MOD BP:    50.2 ml RIGHT VENTRICLE             IVC RV Basal diam:  2.40 cm     IVC diam: 1.80 cm RV Mid diam:    1.90 cm RV S prime:     10.00 cm/s TAPSE (M-mode): 1.7 cm LEFT ATRIUM             Index        RIGHT ATRIUM           Index LA diam:        2.90 cm 1.65 cm/m   RA Area:     11.50 cm LA Vol (A2C):   25.8 ml 14.72 ml/m  RA Volume:   20.80 ml  11.87 ml/m LA Vol (A4C):   44.8 ml 25.56 ml/m LA Biplane Vol: 35.6 ml 20.31 ml/m  AORTIC VALVE AV Area (Vmax): 2.23 cm AV Vmax:        138.00 cm/s AV Peak Grad:   7.6 mmHg LVOT Vmax:      97.90 cm/s LVOT Vmean:     77.500 cm/s LVOT VTI:       0.146 m  AORTA Ao Root diam: 3.70 cm Ao Asc diam:  3.40 cm MITRAL VALVE                TRICUSPID VALVE MV Area (PHT): 7.66 cm     TR Peak grad:   28.3 mmHg MV Decel Time: 99 msec      TR Vmax:        266.00 cm/s MV E velocity: 131.00 cm/s MV A velocity: 44.10 cm/s   SHUNTS MV E/A ratio:  2.97         Systemic VTI:  0.15 m  Systemic Diam: 2.00 cm Lyman Bishop MD Electronically signed by Lyman Bishop MD Signature Date/Time: 10/29/2021/11:08:47 AM    Final     Subjective: Feeling ok, happy to go home.   Discharge Exam: Vitals:   11/10/21 0400 11/10/21 0912  BP:  137/70  Pulse:  72  Resp: (!) 24 20  Temp:  98.7 F (37.1 C)  SpO2:  90%     General: Pt is alert, awake, not in acute distress Cardiovascular: RRR, S1/S2 +, no rubs, no gallops Respiratory: CTA bilaterally, no wheezing, no rhonchi Abdominal: Soft, NT, ND, bowel sounds + Extremities: no edema, no cyanosis    The results of significant diagnostics from this hospitalization (including imaging, microbiology, ancillary and laboratory) are listed below for reference.     Microbiology: Recent  Results (from the past 240 hour(s))  Culture, blood (routine x 2)     Status: None   Collection Time: 11/03/21  7:58 AM   Specimen: BLOOD  Result Value Ref Range Status   Specimen Description BLOOD LEFT ANTECUBITAL  Final   Special Requests   Final    BOTTLES DRAWN AEROBIC ONLY Blood Culture results may not be optimal due to an inadequate volume of blood received in culture bottles   Culture   Final    NO GROWTH 5 DAYS Performed at [facility], [place] [address], [place], [place] [address]    Report Status 11/08/2021 FINAL  Final  Culture, blood (routine x 2)     Status: None   Collection Time: 11/03/21  7:58 AM   Specimen: BLOOD LEFT HAND  Result Value Ref Range Status   Specimen Description BLOOD LEFT HAND  Final   Special Requests   Final    BOTTLES DRAWN AEROBIC AND ANAEROBIC Blood Culture results may not be optimal due to an inadequate volume of blood received in culture bottles   Culture   Final    NO GROWTH 5 DAYS Performed at [facility], [place] [address] [place], [place] [address]    Report Status 11/08/2021 FINAL  Final  Blastomyces Antigen     Status: None   Collection Time: 11/05/21  3:47 PM   Specimen: Blood  Result Value Ref Range Status   Blastomyces Antigen None Detected None Detected ng/mL Final    Comment: (NOTE) Results reported as ng/mL in 0.2 - 14.7 ng/mL range Results above the limit of detection but below 0.2 ng/mL are reported as 'Positive, Below the Limit of Quantification' Results above 14.7 ng/mL are reported as 'Positive, Above the Limit of Quantification'    Specimen Type SERUM  Final    Comment: (NOTE) Performed At: [facility] [place], [place] [id number] Bruce Donath MD LK:[id number] Performed At: [facility] [address] [place], [place] [id number] Rush Farmer MD XB:[id number]   Culture, blood (routine x 2)     Status: None (Preliminary result)   Collection Time: 11/07/21  2:38 PM    Specimen: BLOOD RIGHT HAND  Result Value Ref Range Status   Specimen Description BLOOD RIGHT HAND  Final   Special Requests   Final    BOTTLES DRAWN AEROBIC ONLY Blood Culture results may not be optimal due to an inadequate volume of blood received in culture bottles   Culture   Final    NO GROWTH 3 DAYS Performed at [facility], [place] [address] [place], [place] [address]    Report Status PENDING  Incomplete  Culture, blood (  routine x 2)     Status: None (Preliminary result)   Collection Time: 11/07/21  2:38 PM   Specimen: BLOOD LEFT HAND  Result Value Ref Range Status   Specimen Description BLOOD LEFT HAND  Final   Special Requests   Final    BOTTLES DRAWN AEROBIC ONLY Blood Culture results may not be optimal due to an inadequate volume of blood received in culture bottles   Culture   Final    NO GROWTH 3 DAYS Performed at [facility], [place] [address] [place], [place] [address]    Report Status PENDING  Incomplete     Labs: BNP (last 3 results) Recent Labs    11/01/21 0213  BNP 149.7*   Basic Metabolic Panel: Recent Labs  Lab 11/04/21 0748 11/05/21 0227 11/06/21 0400 11/07/21 0257 11/08/21 0223 11/10/21 0545  NA 137 138 138 137 139 141  K 3.8 3.7 3.8 3.3* 3.6 3.6  CL 108 109 109 109 111 109  CO2 _0 20* 20* 25  GLUCOSE 112* 94 105* 102* 101* 99  BUN _1 6* 6* <5*  CREATININE 0.89 0.96 0.87 0.78 0.81 0.88  CALCIUM 7.7* 7.8* 7.9* 7.7* 7.8* 8.2*  MG 1.6* 1.8 1.4* 1.7  --  1.3*  PHOS 3.1 3.2 2.8 2.7  --   --    Liver Function Tests: Recent Labs  Lab 11/04/21 0748 11/05/21 0227 11/06/21 0400 11/07/21 0257  AST  --  20  --   --   ALT  --  36  --   --   ALKPHOS  --  90  --   --   BILITOT  --  0.9  --   --   PROT  --  4.5*  --   --   ALBUMIN 1.7* 1.8* 1.9* 1.9*   No results for input(s): LIPASE, AMYLASE in the last 168 hours. No results for input(s): AMMONIA in the last 168 hours. CBC: Recent Labs  Lab 11/05/21 0227  11/05/21 1124 11/07/21 1315 11/07/21 2010 11/08/21 0223 11/09/21 0130 11/10/21 0545  WBC 2.7*   < > 3.1* 2.8* 2.7* 2.6* 2.8*  NEUTROABS 1.3*  --   --   --  0.7*  --   --   HGB 7.8*   < > 8.6* 7.3* 7.3* 8.5* 9.2*  HCT 23.0*   < > 25.3* 22.0* 21.2* 24.3* 27.1*  MCV 91.3   < > 90.7 90.9 90.6 90.3 91.2  PLT 57*   < > 80* 70* 69* 66* 75*   < > = values in this interval not displayed.   Cardiac Enzymes: No results for input(s): CKTOTAL, CKMB, CKMBINDEX, TROPONINI in the last 168 hours. BNP: Invalid input(s): POCBNP CBG: Recent Labs  Lab 11/09/21 1639 11/09/21 1942 11/09/21 2238 11/10/21 0910 11/10/21 1236  GLUCAP 95 138* 110* 98 97   D-Dimer No results for input(s): DDIMER in the last 72 hours. Hgb A1c No results for input(s): HGBA1C in the last 72 hours. Lipid Profile No results for input(s): CHOL, HDL, LDLCALC, TRIG, CHOLHDL, LDLDIRECT in the last 72 hours. Thyroid function studies No results for input(s): TSH, T4TOTAL, T3FREE, THYROIDAB in the last 72 hours.  Invalid input(s): FREET3 Anemia work up No results for input(s): VITAMINB12, FOLATE, FERRITIN, TIBC, IRON, RETICCTPCT in the last 72 hours. Urinalysis    Component Value Date/Time   COLORURINE YELLOW 11/03/2021 0734   APPEARANCEUR CLEAR 11/03/2021 0734   LABSPEC 1.016 11/03/2021 0734   PHURINE 7.0  11/03/2021 [place] 11/03/2021 0734   HGBUR SMALL (A) 11/03/2021 0734   BILIRUBINUR NEGATIVE 11/03/2021 [place] 11/03/2021 0734   PROTEINUR NEGATIVE 11/03/2021 0734   NITRITE NEGATIVE 11/03/2021 0734   LEUKOCYTESUR NEGATIVE 11/03/2021 0734   Sepsis Labs Invalid input(s): PROCALCITONIN,  WBC,  LACTICIDVEN Microbiology Recent Results (from the past 240 hour(s))  Culture, blood (routine x 2)     Status: None   Collection Time: 11/03/21  7:58 AM   Specimen: BLOOD  Result Value Ref Range Status   Specimen Description BLOOD LEFT ANTECUBITAL  Final   Special Requests   Final     BOTTLES DRAWN AEROBIC ONLY Blood Culture results may not be optimal due to an inadequate volume of blood received in culture bottles   Culture   Final    NO GROWTH 5 DAYS Performed at [facility], [place] [address], [place], [place] [address]    Report Status 11/08/2021 FINAL  Final  Culture, blood (routine x 2)     Status: None   Collection Time: 11/03/21  7:58 AM   Specimen: BLOOD LEFT HAND  Result Value Ref Range Status   Specimen Description BLOOD LEFT HAND  Final   Special Requests   Final    BOTTLES DRAWN AEROBIC AND ANAEROBIC Blood Culture results may not be optimal due to an inadequate volume of blood received in culture bottles   Culture   Final    NO GROWTH 5 DAYS Performed at [facility], [place] [address], [place], [place] [address]    Report Status 11/08/2021 FINAL  Final  Blastomyces Antigen     Status: None   Collection Time: 11/05/21  3:47 PM   Specimen: Blood  Result Value Ref Range Status   Blastomyces Antigen None Detected None Detected ng/mL Final    Comment: (NOTE) Results reported as ng/mL in 0.2 - 14.7 ng/mL range Results above the limit of detection but below 0.2 ng/mL are reported as 'Positive, Below the Limit of Quantification' Results above 14.7 ng/mL are reported as 'Positive, Above the Limit of Quantification'    Specimen Type SERUM  Final    Comment: (NOTE) Performed At: [facility] [place], [place] [id number] Bruce Donath MD YT:[id number] Performed At: [facility] [address] [place], [place] [id number] Rush Farmer MD BS:[id number]   Culture, blood (routine x 2)     Status: None (Preliminary result)   Collection Time: 11/07/21  2:38 PM   Specimen: BLOOD RIGHT HAND  Result Value Ref Range Status   Specimen Description BLOOD RIGHT HAND  Final   Special Requests   Final    BOTTLES DRAWN AEROBIC ONLY Blood Culture results may not be optimal due to an inadequate volume of blood received in  culture bottles   Culture   Final    NO GROWTH 3 DAYS Performed at [facility], [place] [address] [place], [place] [address]    Report Status PENDING  Incomplete  Culture, blood (routine x 2)     Status: None (Preliminary result)   Collection Time: 11/07/21  2:38 PM   Specimen: BLOOD LEFT HAND  Result Value Ref Range Status   Specimen Description BLOOD LEFT HAND  Final   Special Requests   Final    BOTTLES DRAWN AEROBIC ONLY Blood Culture results may not be optimal due to an inadequate volume of blood received in culture bottles   Culture  Final    NO GROWTH 3 DAYS Performed at [facility], [place] [address], [place], [place] [address]    Report Status PENDING  Incomplete     Time coordinating discharge: 40 minutes  SIGNED:   Elmarie Shiley, MD  [facility]

## 2021-11-10 NOTE — TOC Transition Note (Signed)
Transition of Care [facility]) - CM/SW Discharge Note   Patient Details  Name: Eugene Cooper MRN: [id number] Date of Birth: [birth year]  [facility] CM/SW Contact:  Carles Collet, RN Phone Number: 11/10/2021, 10:37 AM   Clinical Narrative:    Spoke with patient's son [name]. He conforms that plans are for patient to return home at DC.  He states that bed has not been delivered to the house. He states that patient can return home before bed is delivered. Spoke w [employer] who will expedite delivery and verified that it will be delivered today.  He states that they can support him with transportation home via private vehicle and wheelchair they have at the house. Notified [facility] that Patient will DC today.     [name],Jose Son   [phone]     Final next level of care: [place] Barriers to Discharge: No Barriers Identified   Patient Goals and CMS Choice Patient states their goals for this hospitalization and ongoing recovery are:: return home CMS Medicare.gov Compare Post Acute Care list provided to:: Patient Represenative (must comment) Choice offered to / list presented to : Adult Children  Discharge Placement                       Discharge Plan and Services   Discharge Planning Services: CM Consult Post Acute Care Choice: Durable Medical Equipment, Home Health          DME Arranged: 3-N-1, Hospital bed DME Agency: [employer] Date DME Agency Contacted: 11/10/21 Time DME Agency Contacted: 0712 Representative spoke with at DME Agency: [place]: RN, PT, OT [place] Agency: [place] Date [place]: 11/10/21 Time [place]: 1037 Representative spoke with at [place]: [place] ([place]) Interventions     Readmission Risk Interventions No flowsheet data found.

## 2021-11-12 ENCOUNTER — Telehealth: Payer: Self-pay | Admitting: Hematology

## 2021-11-12 LAB — MISC LABCORP TEST (SEND OUT): Labcorp test code: 9985

## 2021-11-12 LAB — CULTURE, BLOOD (ROUTINE X 2)
Culture: NO GROWTH
Culture: NO GROWTH

## 2021-11-12 NOTE — Telephone Encounter (Signed)
Scheduled per sch msg. Called and left msg with interpreter.

## 2021-11-13 ENCOUNTER — Other Ambulatory Visit: Payer: Self-pay | Admitting: Nurse Practitioner

## 2021-11-13 DIAGNOSIS — D61818 Other pancytopenia: Secondary | ICD-10-CM

## 2021-11-13 DIAGNOSIS — D469 Myelodysplastic syndrome, unspecified: Secondary | ICD-10-CM

## 2021-11-13 NOTE — Progress Notes (Addendum)
[place]   [phone] Fax:([phone]   Clinic Follow up Note   Patient Care Team: Emelia Loron, NP as PCP - General (Nurse Practitioner) Jettie Booze, MD as PCP - Cardiology (Cardiology) Truitt Merle, MD as Consulting Physician (Hematology) Date of Service: 11/15/2021   CHIEF COMPLAINT: Hospital follow-up for pancytopenia/MDS  CURRENT THERAPY: Pending further work-up  INTERVAL HISTORY: Mr. Grieshaber returns for follow-up as scheduled. Initially seen by Dr. Burr Medico 11/05/2021 in the hospital while admitted for sepsis and pancytopenia. ANC 0.7, plt 40-80's, and hgb as low as 7.3. Anemia work-up was negative for nutritional deficiency and hemolysis, low reticulocyte count.  Imaging was negative for lymphadenopathy or solid organ malignancy.  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  Discharged on 11/10/2021, hemoglobin was 9.2, platelets 75.  Today he presents in a wheelchair, here with his son and granddaughter who is interpreting for him.  They declined a third-party interpreter.  He has bilateral knee pain left greater than right and redness over the past 3 days.  He has history of gout, flares primarily in the ankles which are also tender.  He is taking allopurinol and colchicine.  He remains deconditioned and weak, bedbound except up to bathroom.  He can ambulate with assistance and a walker.  He has not fallen.  Denies recent fever, chills, cough, chest pain, dyspnea.  They have not heard from PT, granddaughter called yesterday and they will update her on Friday.  Checking blood sugars at home, range 150-210.     MEDICAL HISTORY:  Past Medical History:  Diagnosis Date   CKD (chronic kidney disease), stage III ([place])    Diabetes ([place])    Hyperlipemia    Hypertension    LBBB (left bundle branch block)    Stroke [facility])     SURGICAL HISTORY: Past Surgical History:  Procedure Laterality Date   BIOPSY   10/29/2021   Procedure: BIOPSY;  Surgeon: Thornton Park, MD;  Location: [place];  Service: Gastroenterology;;   ESOPHAGOGASTRODUODENOSCOPY (EGD) WITH PROPOFOL N/A 10/29/2021   Procedure: ESOPHAGOGASTRODUODENOSCOPY (EGD) WITH PROPOFOL;  Surgeon: Thornton Park, MD;  Location: [place];  Service: Gastroenterology;  Laterality: N/A;    I have reviewed the social history and family history with the patient and they are unchanged from previous note.  ALLERGIES:  has No Known Allergies.  MEDICATIONS:  Current Outpatient Medications  Medication Sig Dispense Refill   Accu-Chek Softclix Lancets lancets USE UPTO FOUR TIMES DAILY AS DIRECTED     acetaminophen (TYLENOL) 325 MG tablet Take 1-2 tablets (325-650 mg total) by mouth every 4 (four) hours as needed for mild pain.     allopurinol (ZYLOPRIM) 300 MG tablet Take 1 tablet (300 mg total) by mouth daily. 30 tablet 0   atorvastatin (LIPITOR) 40 MG tablet Take 1 tablet (40 mg total) by mouth daily at 6 PM. 30 tablet 0   blood glucose meter kit and supplies KIT Dispense based on patient and insurance preference. Use up to four times daily as directed. (FOR ICD-9 250.00, 250.01). 1 each 0   colchicine 0.6 MG tablet Take 1 tablet (0.6 mg total) by mouth daily. 30 tablet 0   diclofenac Sodium (VOLTAREN) 1 % GEL Apply 2 g topically 4 (four) times daily. 150 g 1   magnesium oxide (MAG-OX) 400 (240 Mg) MG tablet Take 0.5 tablets (200 mg total) by mouth 2 (two) times daily. 10 tablet 0   Menthol-Methyl Salicylate (MUSCLE RUB) 10-15 %  CREA Apply 1 application topically 2 (two) times daily as needed for muscle pain. To lower back  0   metFORMIN (GLUCOPHAGE) 1000 MG tablet Take 1,000 mg by mouth 2 (two) times daily.     Multiple Vitamin (MULTIVITAMIN WITH MINERALS) TABS tablet Take 1 tablet by mouth daily.     pantoprazole (PROTONIX) 40 MG tablet Take 1 tablet (40 mg total) by mouth 2 (two) times daily. 30 tablet 1   No current  facility-administered medications for this visit.    PHYSICAL EXAMINATION: ECOG PERFORMANCE STATUS: 3 - Symptomatic, >50% confined to bed  Vitals:   11/15/21 0920  BP: 114/66  Pulse: (!) 119  Resp: 20  Temp: 99.4 F (37.4 C)  SpO2: 99%   Filed Weights   11/15/21 0920  Weight: 124 lb (56.2 kg)    GENERAL:alert, no distress, weak but comfortable in a wheelchair SKIN: dry, no rash  EYES: sclera clear LUNGS: diminished, with normal breathing effort HEART: regular rate & rhythm, no lower extremity edema ABDOMEN:abdomen soft, non-tender and normal bowel sounds Musculoskeletal: local edema at the bilateral knees L>R. L knee with erythema and ttp NEURO: alert & oriented x 3 with fluent speech, generalized weakness. Could not stand during exam  LABORATORY DATA:  I have reviewed the data as listed CBC Latest Ref Rng & Units 11/15/2021 11/10/2021 11/09/2021  WBC 4.0 - 10.5 K/uL 3.0(L) 2.8(L) 2.6(L)  Hemoglobin 13.0 - 17.0 g/dL 9.3(L) 9.2(L) 8.5(L)  Hematocrit 39.0 - 52.0 % 26.5(L) 27.1(L) 24.3(L)  Platelets 150 - 400 K/uL 141(L) 75(L) 66(L)     CMP Latest Ref Rng & Units 11/10/2021 11/08/2021 11/07/2021  Glucose 70 - 99 mg/dL 99 101(H) 102(H)  BUN 8 - 23 mg/dL <5(L) 6(L) 6(L)  Creatinine 0.61 - 1.24 mg/dL 0.88 0.81 0.78  Sodium 135 - 145 mmol/L 141 139 137  Potassium 3.5 - 5.1 mmol/L 3.6 3.6 3.3(L)  Chloride 98 - 111 mmol/L 109 111 109  CO2 22 - 32 mmol/L 25 20(L) 20(L)  Calcium 8.9 - 10.3 mg/dL 8.2(L) 7.8(L) 7.7(L)  Total Protein 6.5 - 8.1 g/dL - - -  Total Bilirubin 0.3 - 1.2 mg/dL - - -  Alkaline Phos 38 - 126 U/L - - -  AST 15 - 41 U/L - - -  ALT 0 - 44 U/L - - -      RADIOGRAPHIC STUDIES: I have personally reviewed the radiological images as listed and agreed with the findings in the report. No results found.   ASSESSMENT & PLAN: 80 yo male   Pancytopenia, MDS-EB2 -we reviewed his hospital work up including imaging and labs in detail. He presented with fever and  dyspnea, he was admitted 10/28/21 for sepsis, Afib, and AKI. He was found to have pancytopenia with severe anemia Hgb 6.4 on admission. Further work up ruled out nutritional anemia (G66, folic acid, iron) and hemolysis -EGD showed erosive gastritis, currently asymptomatic on PPI -imaging was negative for lymphadenopathy or solid organ malignancy, it did show findings c/w pneumonia, he was treated with broad spectrum antibiotics, he is followed by ID but work up was essentially negative -blood smear was negative except cytopenias. MM panel was negative for elevated immunoglobulins or M protein, light chains were normal.  -He underwent bone marrow biopsy 11/08/21, results reviewed today which shows hypercellular marrow with increased blasts up to 10-15%, consistent with myelodysplastic syndrome (MDS) -we reviewed the typical clinical course and risk for transformation to leukemia. We discussed 10-15% blasts is pre-leukemia (leukemia is  20% blasts) and we recommend treatment to control the disease and delay transformation -Cytogenetics is pending, will need this to complete IPSS stage but he is at least intermediate risk due to his pancytopenias and % blasts -Mr. Colon appears weak but stable for outpatient management, he has low PS. His granddaughter is following up with PT. I referred him to dietician -Dr. Burr Medico recommends beginning vidaza, potential side effects including fatigue, decreased appetite, mild n/v, bowel change, and cytopenias were discussed. He agrees to proceed.  -labs reviewed, CBC improved, hgb >9 and plt 141. Will hold off on aranesp for now. He does not need other transfusions today -we will see him back in 2 weeks to review cytogenetics, finalize stage, and begin treatment.  -we recommend to get vaccines/boosters in the interim.  -patient seen with Dr. Burr Medico   Deconditioning, weight loss  -He lost approx 40 lbs since in 6 months (since 05/2021) -he started facility PT, completed 1  visit prior to this hospitalization.  -Pt is very weak today, can't stand for exam. He requires assistance with ADLs and walker at home. Reviewed fall precautions.  -Will resume PT now that he is home to improve PS -referred to dietician   Gout, current flare -He has h/o gout flares primarily in his ankles. Currently has L>R knee swelling, pain, and redness for 3 days.  -L knee significantly ttp on exam -continue allopurinol, increase colchicine to 1.2 mg x1 when you get home (he had already taken 0.6 mg this morning), then take 0.6 mg BID x1 week or until gout flare resolves, then resume back to 0.6 mg daily   CVA, HL, HTN, DM, LBBB -BG 150 - 210 at home -continue med regimen and f/up per PCP, cards, and care team   PLAN: -Hospital work up, today's lab, and bone marrow reviewed -NGS and cytogenetics are pending -Reviewed MDS spectrum disorder, this is at least moderate risk/pre-leukemia with 10-15% blasts -hgb 9.2 today, does not need epo for now -Granddaughter to f/up PT referral -Flu vac today, can get covid-19 booster at local pharmacy in 2 weeks -Referral to dietician -Lab and f/up Dr. Burr Medico in 2 weeks to finalize treatment plan, likely vidaza  -Treat acute gout flare continue allopurinol daily, take colchicine 1.2 mg po when you get home today, then starting 12/9 take 1 tab (0.6 mg) BID for 1 week or until gout flare resolves, then back to 1 tab daily routine dose   No problem-specific Assessment & Plan notes found for this encounter.   Orders Placed This Encounter  Procedures   Ambulatory Referral to [facility]    Referral Priority:   Routine    Referral Type:   Consultation    Referral Reason:   Specialty Services Required    Number of Visits Requested:   1    All questions were answered. The patient knows to call the clinic with any problems, questions or concerns. No barriers to learning were detected.     Alla Feeling, NP 11/17/21   Addendum  I saw Mr.  Mabry in hospital and he is here for follow up and discuss bone marrow biopsy results. I discussed his diagnosis of MDS based on the biopsy, his cytogenetics and molecular testing from [facility] are still pending. But based on his cytopenia and increased blasts in marrow (10-15%), his IPSS-R is at least 4, predicts worse outcome and high risk disease. He will likely needs chemo Vidaza, unless he has 5q deletion. I reviewed the chemo  option with him and he is interested. The good thing is his cytopenias have improved on lab today and he is recovering from his recent episode of febrile illness. I plan to see him back in 2 weeks to finalize his treatment plan.  Truitt Merle  12/9/202

## 2021-11-15 ENCOUNTER — Inpatient Hospital Stay: Payer: Medicare Other

## 2021-11-15 ENCOUNTER — Other Ambulatory Visit: Payer: Self-pay

## 2021-11-15 ENCOUNTER — Inpatient Hospital Stay: Payer: Medicare Other | Attending: Nurse Practitioner | Admitting: Nurse Practitioner

## 2021-11-15 VITALS — BP 114/66 | HR 119 | Temp 99.4°F | Resp 20 | Ht 62.0 in | Wt 124.0 lb

## 2021-11-15 DIAGNOSIS — Z23 Encounter for immunization: Secondary | ICD-10-CM

## 2021-11-15 DIAGNOSIS — D61818 Other pancytopenia: Secondary | ICD-10-CM

## 2021-11-15 DIAGNOSIS — D469 Myelodysplastic syndrome, unspecified: Secondary | ICD-10-CM

## 2021-11-15 DIAGNOSIS — Z5111 Encounter for antineoplastic chemotherapy: Secondary | ICD-10-CM | POA: Diagnosis not present

## 2021-11-15 DIAGNOSIS — D4622 Refractory anemia with excess of blasts 2: Secondary | ICD-10-CM | POA: Insufficient documentation

## 2021-11-15 DIAGNOSIS — N183 Chronic kidney disease, stage 3 unspecified: Secondary | ICD-10-CM | POA: Diagnosis not present

## 2021-11-15 LAB — CBC WITH DIFFERENTIAL (CANCER CENTER ONLY)
Abs Immature Granulocytes: 0.5 10*3/uL — ABNORMAL HIGH (ref 0.00–0.07)
Band Neutrophils: 4 %
Basophils Absolute: 0 10*3/uL (ref 0.0–0.1)
Basophils Relative: 0 %
Eosinophils Absolute: 0 10*3/uL (ref 0.0–0.5)
Eosinophils Relative: 0 %
HCT: 26.5 % — ABNORMAL LOW (ref 39.0–52.0)
Hemoglobin: 9.3 g/dL — ABNORMAL LOW (ref 13.0–17.0)
Lymphocytes Relative: 46 %
Lymphs Abs: 1.4 10*3/uL (ref 0.7–4.0)
MCH: 31.1 pg (ref 26.0–34.0)
MCHC: 35.1 g/dL (ref 30.0–36.0)
MCV: 88.6 fL (ref 80.0–100.0)
Metamyelocytes Relative: 7 %
Monocytes Absolute: 0.2 10*3/uL (ref 0.1–1.0)
Monocytes Relative: 5 %
Myelocytes: 10 %
Neutro Abs: 1 10*3/uL — ABNORMAL LOW (ref 1.7–7.7)
Neutrophils Relative %: 28 %
Platelet Count: 141 10*3/uL — ABNORMAL LOW (ref 150–400)
RBC: 2.99 MIL/uL — ABNORMAL LOW (ref 4.22–5.81)
RDW: 15.2 % (ref 11.5–15.5)
WBC Count: 3 10*3/uL — ABNORMAL LOW (ref 4.0–10.5)
nRBC: 0 % (ref 0.0–0.2)

## 2021-11-15 MED ORDER — INFLUENZA VAC A&B SA ADJ QUAD 0.5 ML IM PRSY
0.5000 mL | PREFILLED_SYRINGE | Freq: Once | INTRAMUSCULAR | Status: AC
  Administered 2021-11-15: 0.5 mL via INTRAMUSCULAR
  Filled 2021-11-15: qty 0.5

## 2021-11-15 NOTE — Patient Instructions (Signed)
Influenza Virus Vaccine injection °What is this medication? °INFLUENZA VIRUS VACCINE (in floo EN zuh VAHY ruhs vak SEEN) helps to reduce the risk of getting influenza also known as the flu. The vaccine only helps protect you against some strains of the flu. °This medicine may be used for other purposes; ask your health care provider or pharmacist if you have questions. °COMMON BRAND NAME(S): Afluria, Afluria Quadrivalent, Agriflu, Alfuria, FLUAD, FLUAD Quadrivalent, Fluarix, Fluarix Quadrivalent, Flublok, Flublok Quadrivalent, FLUCELVAX, FLUCELVAX Quadrivalent, Flulaval, Flulaval Quadrivalent, Fluvirin, Fluzone, Fluzone High-Dose, Fluzone Intradermal, Fluzone Quadrivalent °What should I tell my care team before I take this medication? °They need to know if you have any of these conditions: °bleeding disorder like hemophilia °fever or infection °Guillain-Barre syndrome or other neurological problems °immune system problems °infection with the human immunodeficiency virus (HIV) or AIDS °low blood platelet counts °multiple sclerosis °an unusual or allergic reaction to influenza virus vaccine, latex, other medicines, foods, dyes, or preservatives. Different brands of vaccines contain different allergens. Some may contain latex or eggs. Talk to your doctor about your allergies to make sure that you get the right vaccine. °pregnant or trying to get pregnant °breast-feeding °How should I use this medication? °This vaccine is for injection into a muscle or under the skin. It is given by a health care professional. °A copy of Vaccine Information Statements will be given before each vaccination. Read this sheet carefully each time. The sheet may change frequently. °Talk to your healthcare provider to see which vaccines are right for you. Some vaccines should not be used in all age groups. °Overdosage: If you think you have taken too much of this medicine contact a poison control center or emergency room at once. °NOTE: This  medicine is only for you. Do not share this medicine with others. °What if I miss a dose? °This does not apply. °What may interact with this medication? °chemotherapy or radiation therapy °medicines that lower your immune system like etanercept, anakinra, infliximab, and adalimumab °medicines that treat or prevent blood clots like warfarin °phenytoin °steroid medicines like prednisone or cortisone °theophylline °vaccines °This list may not describe all possible interactions. Give your health care provider a list of all the medicines, herbs, non-prescription drugs, or dietary supplements you use. Also tell them if you smoke, drink alcohol, or use illegal drugs. Some items may interact with your medicine. °What should I watch for while using this medication? °Report any side effects that do not go away within 3 days to your doctor or health care professional. Call your health care provider if any unusual symptoms occur within 6 weeks of receiving this vaccine. °You may still catch the flu, but the illness is not usually as bad. You cannot get the flu from the vaccine. The vaccine will not protect against colds or other illnesses that may cause fever. The vaccine is needed every year. °What side effects may I notice from receiving this medication? °Side effects that you should report to your doctor or health care professional as soon as possible: °allergic reactions like skin rash, itching or hives, swelling of the face, lips, or tongue °Side effects that usually do not require medical attention (report to your doctor or health care professional if they continue or are bothersome): °fever °headache °muscle aches and pains °pain, tenderness, redness, or swelling at the injection site °tiredness °This list may not describe all possible side effects. Call your doctor for medical advice about side effects. You may report side effects to FDA at 1-800-FDA-1088. °  Where should I keep my medication? °The vaccine will be given  by a health care professional in a clinic, pharmacy, doctor's office, or other health care setting. You will not be given vaccine doses to store at home. °NOTE: This sheet is a summary. It may not cover all possible information. If you have questions about this medicine, talk to your doctor, pharmacist, or health care provider. °© 2022 [employer]vier/Gold Standard (2021-08-14 00:00:00) ° °

## 2021-11-16 ENCOUNTER — Telehealth: Payer: Self-pay | Admitting: Hematology

## 2021-11-16 ENCOUNTER — Encounter (HOSPITAL_COMMUNITY): Payer: Self-pay | Admitting: Infectious Disease

## 2021-11-16 NOTE — Telephone Encounter (Signed)
Scheduled follow-up appointment per 12/8 los. Patient's granddaughter is aware.

## 2021-11-17 ENCOUNTER — Encounter: Payer: Self-pay | Admitting: Nurse Practitioner

## 2021-11-20 ENCOUNTER — Encounter (HOSPITAL_COMMUNITY): Payer: Self-pay | Admitting: Infectious Disease

## 2021-11-20 LAB — SURGICAL PATHOLOGY

## 2021-11-22 ENCOUNTER — Other Ambulatory Visit: Payer: Self-pay | Admitting: Internal Medicine

## 2021-11-22 DIAGNOSIS — M1A09X0 Idiopathic chronic gout, multiple sites, without tophus (tophi): Secondary | ICD-10-CM

## 2021-11-23 MED ORDER — ALLOPURINOL 300 MG PO TABS: 300.0000 mg | ORAL_TABLET | Freq: Every day | ORAL | 0 refills | Status: DC

## 2021-11-23 NOTE — Telephone Encounter (Signed)
Please schedule patient for a follow up visit. Patient was due in August 2022. Thanks!

## 2021-11-23 NOTE — Telephone Encounter (Signed)
Next Visit: not scheduled. No showed on 07/09/2021. Message sent to the front to schedule patient.  Last Visit: 06/04/2021  Last Fill: 06/04/2021  DX: Chronic gout of multiple sites, unspecified cause   Current Dose per office note 06/04/2021: previously tolerated medication so resuming at 300 mg daily dose  Labs: 11/15/2021 CBC: WBC 3.0, RBC 2.99, Hgb 9.3, Hct 26.5, Platelet Count 141, Neutro Abs 1.0, Abs Immature Granulocytes 0.50, BUN <5, Calcium 8.2  Okay to refill Allopurinol?

## 2021-11-27 ENCOUNTER — Other Ambulatory Visit: Payer: Self-pay

## 2021-11-27 ENCOUNTER — Inpatient Hospital Stay: Payer: Medicare Other | Admitting: Dietician

## 2021-11-27 ENCOUNTER — Inpatient Hospital Stay: Payer: Medicare Other

## 2021-11-27 ENCOUNTER — Inpatient Hospital Stay (HOSPITAL_BASED_OUTPATIENT_CLINIC_OR_DEPARTMENT_OTHER): Payer: Medicare Other | Admitting: Hematology

## 2021-11-27 VITALS — BP 116/76 | HR 136 | Temp 98.6°F | Resp 18 | Ht 62.0 in | Wt 124.2 lb

## 2021-11-27 DIAGNOSIS — Z5111 Encounter for antineoplastic chemotherapy: Secondary | ICD-10-CM | POA: Diagnosis not present

## 2021-11-27 DIAGNOSIS — D469 Myelodysplastic syndrome, unspecified: Secondary | ICD-10-CM

## 2021-11-27 LAB — CBC WITH DIFFERENTIAL (CANCER CENTER ONLY)
Abs Immature Granulocytes: 0.54 10*3/uL — ABNORMAL HIGH (ref 0.00–0.07)
Basophils Absolute: 0 10*3/uL (ref 0.0–0.1)
Basophils Relative: 1 %
Eosinophils Absolute: 0 10*3/uL (ref 0.0–0.5)
Eosinophils Relative: 0 %
HCT: 25 % — ABNORMAL LOW (ref 39.0–52.0)
Hemoglobin: 8.5 g/dL — ABNORMAL LOW (ref 13.0–17.0)
Immature Granulocytes: 15 %
Lymphocytes Relative: 40 %
Lymphs Abs: 1.4 10*3/uL (ref 0.7–4.0)
MCH: 30.6 pg (ref 26.0–34.0)
MCHC: 34 g/dL (ref 30.0–36.0)
MCV: 89.9 fL (ref 80.0–100.0)
Monocytes Absolute: 0.7 10*3/uL (ref 0.1–1.0)
Monocytes Relative: 18 %
Neutro Abs: 0.9 10*3/uL — ABNORMAL LOW (ref 1.7–7.7)
Neutrophils Relative %: 26 %
Platelet Count: 125 10*3/uL — ABNORMAL LOW (ref 150–400)
RBC: 2.78 MIL/uL — ABNORMAL LOW (ref 4.22–5.81)
RDW: 15.3 % (ref 11.5–15.5)
WBC Count: 3.5 10*3/uL — ABNORMAL LOW (ref 4.0–10.5)
nRBC: 0 % (ref 0.0–0.2)

## 2021-11-27 LAB — CMP (CANCER CENTER ONLY)
ALT: 11 U/L (ref 0–44)
AST: 13 U/L — ABNORMAL LOW (ref 15–41)
Albumin: 3.6 g/dL (ref 3.5–5.0)
Alkaline Phosphatase: 109 U/L (ref 38–126)
Anion gap: 11 (ref 5–15)
BUN: 16 mg/dL (ref 8–23)
CO2: 24 mmol/L (ref 22–32)
Calcium: 9 mg/dL (ref 8.9–10.3)
Chloride: 103 mmol/L (ref 98–111)
Creatinine: 1.17 mg/dL (ref 0.61–1.24)
GFR, Estimated: >60 mL/min (ref >60)
Glucose, Bld: 160 mg/dL — ABNORMAL HIGH (ref 70–99)
Potassium: 3.9 mmol/L (ref 3.5–5.1)
Sodium: 138 mmol/L (ref 135–145)
Total Bilirubin: 0.9 mg/dL (ref 0.3–1.2)
Total Protein: 6.4 g/dL — ABNORMAL LOW (ref 6.5–8.1)

## 2021-11-27 MED ORDER — ONDANSETRON HCL 8 MG PO TABS: 8.0000 mg | ORAL_TABLET | Freq: Two times a day (BID) | ORAL | 1 refills | Status: AC | PRN

## 2021-11-27 NOTE — Progress Notes (Signed)
[place]   Telephone:(336) [phone] Fax:(336) [phone]   Clinic Follow up Note   Patient Care Team: Emelia Loron, NP as PCP - General (Nurse Practitioner) Jettie Booze, MD as PCP - Cardiology (Cardiology) Truitt Merle, MD as Consulting Physician (Hematology)  Date of Service:  11/27/2021  CHIEF COMPLAINT: f/u of pancytopenia/MDS  CURRENT THERAPY:  PENDING Vidaza, given days 1-5 every 28 days.  ASSESSMENT & PLAN:  Eugene Cooper is a 80 y.o. male with   1. 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 He was found to have pancytopenia with severe anemia Hgb 6.4 on admission. Further work up ruled out nutritional anemia (T34, folic acid, iron) and hemolysis -EGD showed erosive gastritis, currently asymptomatic on PPI -imaging was negative for lymphadenopathy or solid organ malignancy, it did show findings c/w pneumonia, he was treated with broad spectrum antibiotics, he is followed by ID but work up was essentially negative -He underwent bone marrow biopsy 11/08/21, which shows hypercellular marrow with increased blasts up to 10-15%, consistent with myelodysplastic syndrome (MDS) -Her cytogenetics showed complex genetic changes (>3) including 5q- -NGS showed CALR and TP53 mutations, 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 He is high risk. We discussed 10-15% blasts is pre-leukemia (leukemia is 20% blasts) and we recommend treatment to control the disease and delay transformation. -I discussed first line treatment options, including injection or infusion 5 days ago, IV decitabine -given his current condition, poor tolerance to anesthesia which is needed for port placement, my recommendation is for vidaza injection, potential side  effects including fatigue, decreased appetite, mild n/v, bowel change, risk of infection and cytopenias, frequent need for blood transfusion, risk of bleeding, were discussed. This will be given 5 days in a row each month. He agrees to proceed.  -Given his advanced age and poor performance status, supportive care along with blood transfusion and ESA are also reasonable.  I discussed with patient and his family, he would like to try chemo first. -we will schedule chemo education and plan to start treatment next week, if possible.   2. Goal of care discussion  -We discussed the overall poor prognosis, especially if he does not have good response to treatment. -The patient understands the goal of care is palliative.   2. Deconditioning, weight loss  -He lost approx 40 lbs since in 6 months (since 05/2021) -he started facility PT, completed 1 visit prior to this hospitalization.  -He is not eating much, per family. He requires assistance with ADLs and walker at home. Reviewed fall precautions.  -Will resume PT now that he is home to improve PS -referred to dietician    3. Gout, current flare -He has h/o gout flares primarily in his ankles. Currently has L>R knee swelling, pain, and redness for 3 days.  -he has continued knee pain without residual swelling. I will defer to his PCP for management.   4. CVA, HL, HTN, DM, LBBB -BG 150 - 210 at home -continue med regimen and f/up per PCP, cards, and care team     PLAN: -chemo education -plan to begin Vidaza injection next week, 5 days every 4 weeks.  Due to the holiday, plan to start on Tuesday, and complete on Saturday.  I communicated with charge  nurse and pharmacy. -lab and f/u in 2 weeks    No problem-specific Assessment & Plan notes found for this encounter.   INTERVAL HISTORY:  Eugene Cooper is here for a follow up of pancytopenia/MDS. He was last seen by NP Lacie on 11/17/21. He presents to the clinic accompanied by his son,  granddaughter, and an interpreter. They report he is not eating much. From their description, what he is eating is good, he just doesn't have an appetite.   All other systems were reviewed with the patient and are negative.  MEDICAL HISTORY:  Past Medical History:  Diagnosis Date   CKD (chronic kidney disease), stage III ([place])    Diabetes ([place])    Hyperlipemia    Hypertension    LBBB (left bundle branch block)    Stroke [facility])     SURGICAL HISTORY: Past Surgical History:  Procedure Laterality Date   BIOPSY  10/29/2021   Procedure: BIOPSY;  Surgeon: Thornton Park, MD;  Location: [place];  Service: Gastroenterology;;   ESOPHAGOGASTRODUODENOSCOPY (EGD) WITH PROPOFOL N/A 10/29/2021   Procedure: ESOPHAGOGASTRODUODENOSCOPY (EGD) WITH PROPOFOL;  Surgeon: Thornton Park, MD;  Location: [place];  Service: Gastroenterology;  Laterality: N/A;    I have reviewed the social history and family history with the patient and they are unchanged from previous note.  ALLERGIES:  has No Known Allergies.  MEDICATIONS:  Current Outpatient Medications  Medication Sig Dispense Refill   Accu-Chek Softclix Lancets lancets USE UPTO FOUR TIMES DAILY AS DIRECTED     acetaminophen (TYLENOL) 325 MG tablet Take 1-2 tablets (325-650 mg total) by mouth every 4 (four) hours as needed for mild pain.     allopurinol (ZYLOPRIM) 300 MG tablet Take 1 tablet (300 mg total) by mouth daily. 30 tablet 0   atorvastatin (LIPITOR) 40 MG tablet Take 1 tablet (40 mg total) by mouth daily at 6 PM. 30 tablet 0   blood glucose meter kit and supplies KIT Dispense based on patient and insurance preference. Use up to four times daily as directed. (FOR ICD-9 250.00, 250.01). 1 each 0   colchicine 0.6 MG tablet Take 1 tablet (0.6 mg total) by mouth daily. 30 tablet 0   diclofenac Sodium (VOLTAREN) 1 % GEL Apply 2 g topically 4 (four) times daily. 150 g 1   magnesium oxide (MAG-OX) 400 (240 Mg) MG tablet Take 0.5  tablets (200 mg total) by mouth 2 (two) times daily. 10 tablet 0   Menthol-Methyl Salicylate (MUSCLE RUB) 10-15 % CREA Apply 1 application topically 2 (two) times daily as needed for muscle pain. To lower back  0   metFORMIN (GLUCOPHAGE) 1000 MG tablet Take 1,000 mg by mouth 2 (two) times daily.     Multiple Vitamin (MULTIVITAMIN WITH MINERALS) TABS tablet Take 1 tablet by mouth daily.     pantoprazole (PROTONIX) 40 MG tablet Take 1 tablet (40 mg total) by mouth 2 (two) times daily. 30 tablet 1   No current facility-administered medications for this visit.    PHYSICAL EXAMINATION: ECOG PERFORMANCE STATUS: 3 - Symptomatic, >50% confined to bed  Vitals:   11/27/21 1013  BP: 116/76  Pulse: (!) 136  Resp: 18  Temp: 98.6 F (37 C)  SpO2: 98%   Wt Readings from Last 3 Encounters:  11/27/21 124 lb 3.2 oz (56.3 kg)  11/15/21 124 lb (56.2 kg)  10/29/21 140 lb 3.4 oz (63.6 kg)    GENERAL:alert, no distress and comfortable SKIN: skin color normal, no rashes or significant lesions  EYES: normal, Conjunctiva are pink and non-injected, sclera clear  NEURO: alert & oriented x 3 with fluent speech  LABORATORY DATA:  I have reviewed the data as listed CBC Latest Ref Rng & Units 11/27/2021 11/15/2021 11/10/2021  WBC 4.0 - 10.5 K/uL 3.5(L) 3.0(L) 2.8(L)  Hemoglobin 13.0 - 17.0 g/dL 8.5(L) 9.3(L) 9.2(L)  Hematocrit 39.0 - 52.0 % 25.0(L) 26.5(L) 27.1(L)  Platelets 150 - 400 K/uL 125(L) 141(L) 75(L)     CMP Latest Ref Rng & Units 11/10/2021 11/08/2021 11/07/2021  Glucose 70 - 99 mg/dL 99 101(H) 102(H)  BUN 8 - 23 mg/dL <5(L) 6(L) 6(L)  Creatinine 0.61 - 1.24 mg/dL 0.88 0.81 0.78  Sodium 135 - 145 mmol/L 141 139 137  Potassium 3.5 - 5.1 mmol/L 3.6 3.6 3.3(L)  Chloride 98 - 111 mmol/L 109 111 109  CO2 22 - 32 mmol/L 25 20(L) 20(L)  Calcium 8.9 - 10.3 mg/dL 8.2(L) 7.8(L) 7.7(L)  Total Protein 6.5 - 8.1 g/dL - - -  Total Bilirubin 0.3 - 1.2 mg/dL - - -  Alkaline Phos 38 - 126 U/L - - -  AST 15  - 41 U/L - - -  ALT 0 - 44 U/L - - -      RADIOGRAPHIC STUDIES: I have personally reviewed the radiological images as listed and agreed with the findings in the report. No results found.    No orders of the defined types were placed in this encounter.  All questions were answered. The patient knows to call the clinic with any problems, questions or concerns. No barriers to learning was detected. The total time spent in the appointment was 50 minutes.     Aurea Graff 11/27/2021   I, Wilburn Mylar, am acting as scribe for Truitt Merle, MD.   I have reviewed the above documentation for accuracy and completeness, and I agree with the above.

## 2021-11-27 NOTE — Progress Notes (Signed)
Nutrition Assessment   Reason for Assessment: weight loss   ASSESSMENT: 80 year old spanish speaking male with newly diagnosed MDS. He is pending start of chemotherapy with vidaza. Patient is followed by Dr. Burr Medico.  Past medical history includes CKD stage III, DM2, HLD, HTN, left pontine stroke, atrial fibrillation with RVR, erosive gastritis, gout, moderate malnutrition -recent hospital admission on 11/20 for sepsis, a-fib, AKI and found to have pancytopenia as well as severe anemia with Hgb 6.4  Met with patient, son and granddaughter who is interpreting for him. Patient reports he has lower extremity weakness and is fatigued. Patient is in a wheelchair today. He is receiving PT at home a couple times/week. He reports poor appetite, recalls coffee with bread for breakfast and small amounts of soups (fish, chicken, vegetables), salads, cheese quesadilla. Granddaughter reports he drinks one Boost everyday, she is unsure of which kind. She has been making him smoothies with fruit and Boost a few times/week. He is drinking water, gatorade, pedialyte. Granddaughter reports blood sugars range 120-205 at home.    Nutrition Focused Physical Exam: deferred   Medications: lipitor, zyloprim, colchicine, mag-ox, metformin, MVI, protonix, zofran,    Labs: glucose 160   Anthropometrics: Weights have decreased 39 lbs (24%) from usual weight in 6 months; significant  Height: 5'2" Weight: 124 lb 3.2 oz  UBW: 163 lb (6/27) BMI: 22.72    NUTRITION DIAGNOSIS: Unintentional weight loss related to cancer newly diagnosed MDS as evidenced by reported poor appetite and 24% weight loss in 6 months; significant   INTERVENTION:  Educated on small frequent meals and snacks, encouraged eating  bites/nibbles q2 hours - handout with snack ideas provided Encouraged high calorie high protein foods to promote weight gain - shake and smoothie recipes provided Continue drinking Boost Plus/equivalent, recommend 2-3  day - samples of Ensure Enlive and Ensure Complete given for pt to try, coupons provided Contact information given   MONITORING, EVALUATION, GOAL: Patient will tolerate increased calories and protein to promote weight gain   Next Visit: To be scheduled ~1 month

## 2021-11-27 NOTE — Telephone Encounter (Signed)
LMOM for patient to call and schedule follow-up appointment.   °

## 2021-11-27 NOTE — Progress Notes (Signed)
START ON PATHWAY REGIMEN - MDS     A cycle is every 28 days:     Azacitidine   **Always confirm dose/schedule in your pharmacy ordering system**  Patient Characteristics: Higher-Risk (IPSS-R Score > 3.5), First Line, Not a Transplant Candidate WHO Disease Classification: MDS-EB2 Bone Marrow Blasts (percent): > 10% Cytogenetic Category: Very Poor Platelets (x 10^9/L): ? 100 Absolute Neutrophil Count (x 10^9/L): ? 0.8 Line of Therapy: First Line IPSS-R Risk Category: Very High IPSS-R Risk Score: 8 Check here if patient's risk score was calculated prior to the International Prognostic Scoring System-Revised (IPSS-R): false Hemoglobin (g/dl): 8 to < 10 Patient Characteristics: Not a Transplant Candidate Intent of Therapy: Non-Curative / Palliative Intent, Discussed with Patient

## 2021-11-28 ENCOUNTER — Encounter: Payer: Self-pay | Admitting: Hematology

## 2021-11-29 ENCOUNTER — Emergency Department (HOSPITAL_COMMUNITY): Payer: Medicare Other

## 2021-11-29 ENCOUNTER — Other Ambulatory Visit: Payer: Self-pay

## 2021-11-29 ENCOUNTER — Encounter (HOSPITAL_COMMUNITY): Payer: Self-pay

## 2021-11-29 ENCOUNTER — Emergency Department (HOSPITAL_COMMUNITY)
Admission: EM | Admit: 2021-11-29 | Discharge: 2021-11-29 | Disposition: A | Discharge status: Home / Self Care | Payer: Medicare Other | Attending: Emergency Medicine | Admitting: Emergency Medicine

## 2021-11-29 DIAGNOSIS — R059 Cough, unspecified: Secondary | ICD-10-CM | POA: Diagnosis not present

## 2021-11-29 DIAGNOSIS — E1122 Type 2 diabetes mellitus with diabetic chronic kidney disease: Secondary | ICD-10-CM | POA: Diagnosis not present

## 2021-11-29 DIAGNOSIS — I639 Cerebral infarction, unspecified: Secondary | ICD-10-CM | POA: Insufficient documentation

## 2021-11-29 DIAGNOSIS — Z20822 Contact with and (suspected) exposure to covid-19: Secondary | ICD-10-CM | POA: Insufficient documentation

## 2021-11-29 DIAGNOSIS — I129 Hypertensive chronic kidney disease with stage 1 through stage 4 chronic kidney disease, or unspecified chronic kidney disease: Secondary | ICD-10-CM | POA: Diagnosis not present

## 2021-11-29 DIAGNOSIS — R079 Chest pain, unspecified: Secondary | ICD-10-CM | POA: Insufficient documentation

## 2021-11-29 DIAGNOSIS — Z7984 Long term (current) use of oral hypoglycemic drugs: Secondary | ICD-10-CM | POA: Insufficient documentation

## 2021-11-29 DIAGNOSIS — Z79899 Other long term (current) drug therapy: Secondary | ICD-10-CM | POA: Diagnosis not present

## 2021-11-29 DIAGNOSIS — R0602 Shortness of breath: Secondary | ICD-10-CM | POA: Insufficient documentation

## 2021-11-29 DIAGNOSIS — R000 Tachycardia, unspecified: Secondary | ICD-10-CM | POA: Diagnosis not present

## 2021-11-29 DIAGNOSIS — N182 Chronic kidney disease, stage 2 (mild): Secondary | ICD-10-CM | POA: Diagnosis not present

## 2021-11-29 LAB — COMPREHENSIVE METABOLIC PANEL
ALT: 16 U/L (ref 0–44)
AST: 24 U/L (ref 15–41)
Albumin: 2.8 g/dL — ABNORMAL LOW (ref 3.5–5.0)
Alkaline Phosphatase: 90 U/L (ref 38–126)
Anion gap: 10 (ref 5–15)
BUN: 19 mg/dL (ref 8–23)
CO2: 22 mmol/L (ref 22–32)
Calcium: 8.4 mg/dL — ABNORMAL LOW (ref 8.9–10.3)
Chloride: 105 mmol/L (ref 98–111)
Creatinine, Ser: 1.15 mg/dL (ref 0.61–1.24)
GFR, Estimated: >60 mL/min (ref >60)
Glucose, Bld: 146 mg/dL — ABNORMAL HIGH (ref 70–99)
Potassium: 3.8 mmol/L (ref 3.5–5.1)
Sodium: 137 mmol/L (ref 135–145)
Total Bilirubin: 0.6 mg/dL (ref 0.3–1.2)
Total Protein: 5.9 g/dL — ABNORMAL LOW (ref 6.5–8.1)

## 2021-11-29 LAB — URINALYSIS, ROUTINE W REFLEX MICROSCOPIC
Bilirubin Urine: NEGATIVE
Glucose, UA: NEGATIVE mg/dL
Hgb urine dipstick: NEGATIVE
Ketones, ur: NEGATIVE mg/dL
Leukocytes,Ua: NEGATIVE
Nitrite: NEGATIVE
Protein, ur: NEGATIVE mg/dL
Specific Gravity, Urine: 1.018 (ref 1.005–1.030)
pH: 5 (ref 5.0–8.0)

## 2021-11-29 LAB — CBC WITH DIFFERENTIAL/PLATELET
Abs Immature Granulocytes: 0 10*3/uL (ref 0.00–0.07)
Basophils Absolute: 0 10*3/uL (ref 0.0–0.1)
Basophils Relative: 1 %
Eosinophils Absolute: 0 10*3/uL (ref 0.0–0.5)
Eosinophils Relative: 0 %
HCT: 23.5 % — ABNORMAL LOW (ref 39.0–52.0)
Hemoglobin: 8.1 g/dL — ABNORMAL LOW (ref 13.0–17.0)
Lymphocytes Relative: 52 %
Lymphs Abs: 1.6 10*3/uL (ref 0.7–4.0)
MCH: 31.5 pg (ref 26.0–34.0)
MCHC: 34.5 g/dL (ref 30.0–36.0)
MCV: 91.4 fL (ref 80.0–100.0)
Monocytes Absolute: 0.8 10*3/uL (ref 0.1–1.0)
Monocytes Relative: 27 %
Neutro Abs: 0.6 10*3/uL — ABNORMAL LOW (ref 1.7–7.7)
Neutrophils Relative %: 20 %
Platelets: 96 10*3/uL — ABNORMAL LOW (ref 150–400)
RBC: 2.57 MIL/uL — ABNORMAL LOW (ref 4.22–5.81)
RDW: 15.4 % (ref 11.5–15.5)
WBC: 3.1 10*3/uL — ABNORMAL LOW (ref 4.0–10.5)
nRBC: 0 % (ref 0.0–0.2)
nRBC: 0 /100 WBC

## 2021-11-29 LAB — LACTIC ACID, PLASMA
Lactic Acid, Venous: 3.3 mmol/L (ref 0.5–1.9)
Lactic Acid, Venous: 3 mmol/L (ref 0.5–1.9)

## 2021-11-29 LAB — LIPASE, BLOOD: Lipase: 50 U/L (ref 11–51)

## 2021-11-29 LAB — RESP PANEL BY RT-PCR (FLU A&B, COVID) ARPGX2
Influenza A by PCR: NEGATIVE
Influenza B by PCR: NEGATIVE
SARS Coronavirus 2 by RT PCR: NEGATIVE

## 2021-11-29 LAB — TROPONIN I (HIGH SENSITIVITY)
Troponin I (High Sensitivity): 8 ng/L (ref <18)
Troponin I (High Sensitivity): 8 ng/L (ref <18)

## 2021-11-29 LAB — PROTIME-INR
INR: 1.1 (ref 0.8–1.2)
Prothrombin Time: 14.3 seconds (ref 11.4–15.2)

## 2021-11-29 MED ORDER — LACTATED RINGERS IV SOLN: INTRAVENOUS | Status: DC

## 2021-11-29 MED ORDER — LACTATED RINGERS IV BOLUS
500.0000 mL | Freq: Once | INTRAVENOUS | Status: AC
  Administered 2021-11-29: 17:00:00 500 mL via INTRAVENOUS

## 2021-11-29 MED ORDER — ASPIRIN 81 MG PO CHEW
324.0000 mg | CHEWABLE_TABLET | Freq: Once | ORAL | Status: AC
  Administered 2021-11-29: 16:00:00 324 mg via ORAL
  Filled 2021-11-29: qty 4

## 2021-11-29 NOTE — ED Provider Notes (Signed)
[facility] EMERGENCY DEPARTMENT Provider Note   CSN: [id number] Arrival date & time: 11/29/21  1315     History Chief Complaint  Patient presents with   Chest Pain    Eugene Cooper is a 80 y.o. male.  HPI  Patient was working with his physical therapist at home today.  He is doing lower extremity strengthening exercises.  Patient went into the bathroom and came back out and reported chest pain.  At that time he also appeared a little short of breath and his heart rate was elevated.  Patient's granddaughter determined to bring him into the emergency department for further evaluation.  Patient does report that all chest pain is now resolved.  He denies he has any ongoing nausea or shortness of breath.  He has had possibly some mild cough recently but denies body aches or fevers.  No lower extremity swelling.  Past Medical History:  Diagnosis Date   CKD (chronic kidney disease), stage III ([place])    Diabetes ([place])    Hyperlipemia    Hypertension    LBBB (left bundle branch block)    Stroke [facility])     Patient Active Problem List   Diagnosis Date Noted   Malnutrition of moderate degree 11/09/2021   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[place])    Neutropenic fever ([place])    Pancytopenia (HCC)    Knee pain    Sepsis with acute renal failure and septic shock (HCC)    Symptomatic anemia    Fever of unknown origin    Pneumonia of left lower lobe due to infectious organism    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    Altered mental status    Acute blood loss anemia 10/28/2021   Hypotension 10/28/2021   Atrial fibrillation with RVR ([place]) 10/28/2021   Hyperkalemia 24/23/5361   Metabolic acidosis, increased anion gap (IAG) 44/31/5400   Acute metabolic encephalopathy 86/76/1950   AKI (acute kidney injury) ([place]) 10/28/2021   History of stroke 10/28/2021   Pain in left shoulder 06/04/2021   Gout 06/04/2021   Elevated BUN    Benign essential HTN    Controlled  type 2 diabetes mellitus with hyperglycemia, without long-term current use of insulin (HCC)    Labile blood glucose    Neuropathic pain    Acute ischemic stroke ([place]) 12/22/2019   Uncontrolled type 2 diabetes mellitus with hyperglycemia, without long-term current use of insulin ([place]) 12/22/2019   Mixed hyperlipidemia 12/22/2019   Left pontine stroke ([place]) 12/22/2019   Essential hypertension    Left bundle branch block    Stroke ([place]) 12/20/2019   Right sided weakness 12/19/2019    Past Surgical History:  Procedure Laterality Date   BIOPSY  10/29/2021   Procedure: BIOPSY;  Surgeon: Thornton Park, MD;  Location: [place];  Service: Gastroenterology;;   ESOPHAGOGASTRODUODENOSCOPY (EGD) WITH PROPOFOL N/A 10/29/2021   Procedure: ESOPHAGOGASTRODUODENOSCOPY (EGD) WITH PROPOFOL;  Surgeon: Thornton Park, MD;  Location: [place];  Service: Gastroenterology;  Laterality: N/A;       Family History  Problem Relation Age of Onset   Stroke Mother    Lung disease Father    Diabetes Sister    Heart disease Sister    Hyperlipidemia Son    Hyperlipidemia Daughter    Hypertension Daughter     Social History   Tobacco Use   Smoking status: Never   Smokeless tobacco: Never  Vaping Use   Vaping Use: Never used  Substance Use Topics   Alcohol use:  Not Currently   Drug use: Never    Home Medications Prior to Admission medications   Medication Sig Start Date End Date Taking? Authorizing Provider  Accu-Chek Softclix Lancets lancets USE UPTO FOUR TIMES DAILY AS DIRECTED 01/08/20  Yes [provider]  acetaminophen (TYLENOL) 325 MG tablet Take 1-2 tablets (325-650 mg total) by mouth every 4 (four) hours as needed for mild pain. 01/07/20  Yes Love, Ivan Anchors, PA-C  allopurinol (ZYLOPRIM) 300 MG tablet Take 1 tablet (300 mg total) by mouth daily. 11/23/21  Yes Rice, Resa Miner, MD  atorvastatin (LIPITOR) 40 MG tablet Take 1 tablet (40 mg total) by mouth daily at 6 PM.  01/07/20  Yes Love, Ivan Anchors, PA-C  blood glucose meter kit and supplies KIT Dispense based on patient and insurance preference. Use up to four times daily as directed. (FOR ICD-9 250.00, 250.01). 01/07/20  Yes Love, Ivan Anchors, PA-C  colchicine 0.6 MG tablet Take 1 tablet (0.6 mg total) by mouth daily. 11/11/21  Yes Regalado, Belkys A, MD  diclofenac Sodium (VOLTAREN) 1 % GEL Apply 2 g topically 4 (four) times daily. 11/10/21  Yes Regalado, Belkys A, MD  Menthol-Methyl Salicylate (MUSCLE RUB) 10-15 % CREA Apply 1 application topically 2 (two) times daily as needed for muscle pain. To lower back 01/07/20  Yes Love, Ivan Anchors, PA-C  metFORMIN (GLUCOPHAGE) 1000 MG tablet Take 1,000 mg by mouth 2 (two) times daily. 02/29/20  Yes [provider]  Multiple Vitamin (MULTIVITAMIN WITH MINERALS) TABS tablet Take 1 tablet by mouth daily. 01/07/20  Yes Love, Ivan Anchors, PA-C  pantoprazole (PROTONIX) 40 MG tablet Take 1 tablet (40 mg total) by mouth 2 (two) times daily. 11/10/21 11/10/22 Yes Regalado, Belkys A, MD  magnesium oxide (MAG-OX) 400 (240 Mg) MG tablet Take 0.5 tablets (200 mg total) by mouth 2 (two) times daily. Patient not taking: Reported on 11/29/2021 11/10/21   Regalado, Jerald Kief A, MD  ondansetron (ZOFRAN) 8 MG tablet Take 1 tablet (8 mg total) by mouth 2 (two) times daily as needed (Nausea or vomiting). Patient not taking: Reported on 11/29/2021 11/27/21   Truitt Merle, MD    Allergies    Patient has no known allergies.  Review of Systems   Review of Systems 10 systems reviewed and negative except as per HPI Physical Exam Updated Vital Signs BP 116/76    Pulse (!) 106    Temp 99.1 F (37.3 C) (Oral)    Resp 19    Ht 5' 2"  (1.575 m)    Wt 56 kg    SpO2 100%    BMI 22.58 kg/m   Physical Exam Constitutional:      Appearance: Normal appearance.  HENT:     Head: Normocephalic and atraumatic.     Mouth/Throat:     Pharynx: Oropharynx is clear.  Eyes:     Extraocular Movements: Extraocular  movements intact.  Cardiovascular:     Rate and Rhythm: Regular rhythm. Tachycardia present.  Pulmonary:     Effort: Pulmonary effort is normal.     Breath sounds: Normal breath sounds.  Abdominal:     General: There is no distension.     Palpations: Abdomen is soft.     Tenderness: There is no abdominal tenderness. There is no guarding.  Musculoskeletal:        General: No swelling or tenderness. Normal range of motion.     Right lower leg: No edema.     Left lower leg: No edema.  Skin:    General: Skin is warm and dry.  Neurological:     General: No focal deficit present.     Mental Status: He is alert.     Motor: No weakness.     Coordination: Coordination normal.  Psychiatric:        Mood and Affect: Mood normal.    ED Results / Procedures / Treatments   Labs (all labs ordered are listed, but only abnormal results are displayed) Labs Reviewed  COMPREHENSIVE METABOLIC PANEL - Abnormal; Notable for the following components:      Result Value   Glucose, Bld 146 (*)    Calcium 8.4 (*)    Total Protein 5.9 (*)    Albumin 2.8 (*)    All other components within normal limits  CBC WITH DIFFERENTIAL/PLATELET - Abnormal; Notable for the following components:   WBC 3.1 (*)    RBC 2.57 (*)    Hemoglobin 8.1 (*)    HCT 23.5 (*)    Platelets 96 (*)    All other components within normal limits  URINALYSIS, ROUTINE W REFLEX MICROSCOPIC - Abnormal; Notable for the following components:   APPearance HAZY (*)    All other components within normal limits  RESP PANEL BY RT-PCR (FLU A&B, COVID) ARPGX2  LIPASE, BLOOD  PROTIME-INR  LACTIC ACID, PLASMA  LACTIC ACID, PLASMA  TROPONIN I (HIGH SENSITIVITY)    EKG EKG Interpretation  Date/Time:  Thursday November 29 2021 13:24:37 EST Ventricular Rate:  115 PR Interval:  150 QRS Duration: 142 QT Interval:  360 QTC Calculation: 498 R Axis:   73 Text Interpretation: Sinus tachycardia Left bundle branch block old LBBB, rate  increased since previous Confirmed by Charlesetta Shanks [phone]) on 11/29/2021 4:00:19 PM  Radiology DG Chest Port 1 View  Result Date: 11/29/2021 CLINICAL DATA:  Chest pain EXAM: PORTABLE CHEST 1 VIEW COMPARISON:  Portable exam 1449 hours compared to 10/30/2021 FINDINGS: Mild enlargement of cardiac silhouette. Mediastinal contours and pulmonary vascularity normal. Atherosclerotic calcification aorta. Lungs clear. No pulmonary infiltrate, pleural effusion, or pneumothorax. Osseous structures unremarkable. IMPRESSION: Mild enlargement of cardiac silhouette. No acute abnormalities. Aortic Atherosclerosis (ICD10-I70.0). Electronically Signed   By: Lavonia Dana M.D.   On: 11/29/2021 15:09    Procedures Procedures   Medications Ordered in ED Medications  aspirin chewable tablet 324 mg (has no administration in time range)  lactated ringers infusion (has no administration in time range)  lactated ringers bolus 500 mL (has no administration in time range)    ED Course  I have reviewed the triage vital signs and the nursing notes.  Pertinent labs & imaging results that were available during my care of the patient were reviewed by me and considered in my medical decision making (see chart for details).    MDM Rules/Calculators/A&P                         Patient presents as outlined.  He had chest pain and elevated heart rate.  Upon arrival to the emergency department patient is denying any chest pain.  He is denying any ongoing nausea or shortness of breath.  He does remain mildly tachycardic.  Patient has pre-existing left bundle branch block.  Differential diagnosis includes ACS.  First troponin has returned negative.  Patient will require second troponin.  Patient may also have infectious etiology.  There was some mild endorsement of cough and daughter had reported some decreased oral intake.  COVID and influenza  are pending.  If COVID and influenza are negative, anticipate getting CT PE study as  well.  Dr. Roslynn Amble to assume care at shift change.  We will follow-up on remainder of diagnostic results and reevaluate for final disposition. Final Clinical Impression(s) / ED Diagnoses Final diagnoses:  Nonspecific chest pain  Tachycardia    Rx / DC Orders ED Discharge Orders     None        Charlesetta Shanks, MD 11/29/21 [phone]

## 2021-11-29 NOTE — ED Notes (Signed)
Grandaughter at bedside. No complaints of CP or shortness of breath. Asymptomatic when doing orthostatics. Able to stand at bedside.

## 2021-11-29 NOTE — ED Triage Notes (Signed)
"  Last 3 days he hasn't been eating or drinking much and today he walked to bathroom and complained of pain in right side of chest, I got him back to bed and took his vitals and his heart rate was like 130 so I thought he may be dehydrated and called for EMS" per grandaugter

## 2021-11-29 NOTE — ED Triage Notes (Signed)
On arrival patient states that the chest pain has now subsided and does not hurt any more.

## 2021-11-29 NOTE — ED Provider Notes (Signed)
Sign out note  80 y/o male with episode of chest pain, some shortness of breath, noted to be tacyhcardic prior to arrival. Basic labs wnl. EKG with baseline LBBB, initial trop wnl. Plan to check covid, provide some fluids, repeat troponin and reassess.  3:30 PM received sign out from [employer]  6:34 PM reassessed patient, states symptoms had improved.  Remains mildly tachycardic on monitor, given the report of chest pain, shortness of breath, persistent mild tachycardia, recommend CTA chest to rule out PE.  Noted repeat lactic acid still elevated.  Lengthy conversation with patient regarding recommendation for further observation and testing today however patient adamant about discharge at this time.  He demonstrated understanding of risk/benefits of further testing.  Is agreeable to follow-up with a primary care doctor.  I did review very strict return precautions should patient change his mind anytime or if he has any recurrence of chest pain or difficulty in breathing.  Will discharge AMA.  Utilized Art therapist throughout this visit.   Lucrezia Starch, MD 11/29/21 [phone]

## 2021-11-29 NOTE — Discharge Instructions (Signed)
We recommended additional testing and observation in ER and you have chosen to leave [place].  Please follow-up with your primary care doctor.  If you do not have 1, contact the number provided in these discharge instructions.  If you change your mind or you have any difficulty in breathing, chest pain or other new concerning symptom, please return to the ER for reassessment.

## 2021-11-29 NOTE — ED Notes (Signed)
D/C instructions reviewed with pt's granddaughter

## 2021-11-30 LAB — PATHOLOGIST SMEAR REVIEW

## 2021-12-04 ENCOUNTER — Other Ambulatory Visit: Payer: Self-pay

## 2021-12-04 ENCOUNTER — Inpatient Hospital Stay: Payer: Medicare Other

## 2021-12-04 ENCOUNTER — Encounter: Payer: Self-pay | Admitting: Hematology

## 2021-12-04 VITALS — BP 99/68 | HR 135 | Temp 98.9°F | Resp 18

## 2021-12-04 DIAGNOSIS — D469 Myelodysplastic syndrome, unspecified: Secondary | ICD-10-CM

## 2021-12-04 DIAGNOSIS — Z5111 Encounter for antineoplastic chemotherapy: Secondary | ICD-10-CM | POA: Diagnosis not present

## 2021-12-04 MED ORDER — AZACITIDINE CHEMO SQ INJECTION
75.0000 mg/m2 | Freq: Once | INTRAMUSCULAR | Status: AC
  Administered 2021-12-04: 09:00:00 117.5 mg via SUBCUTANEOUS
  Filled 2021-12-04: qty 4.7

## 2021-12-04 MED ORDER — ONDANSETRON HCL 8 MG PO TABS
8.0000 mg | ORAL_TABLET | Freq: Once | ORAL | Status: AC
  Administered 2021-12-04: 08:00:00 8 mg via ORAL
  Filled 2021-12-04: qty 1

## 2021-12-04 NOTE — Patient Instructions (Addendum)
[facility]   Discharge Instructions: Thank you for choosing [facility] to provide your oncology and hematology care.   If you have a lab appointment with the [facility], please go directly to the [facility] and check in at the registration area.   Wear comfortable clothing and clothing appropriate for easy access to any Portacath or PICC line.   We strive to give you quality time with your provider. You may need to reschedule your appointment if you arrive late (15 or more minutes).  Arriving late affects you and other patients whose appointments are after yours.  Also, if you miss three or more appointments without notifying the office, you may be dismissed from the clinic at the providers discretion.      For prescription refill requests, have your pharmacy contact our office and allow 72 hours for refills to be completed.    Today you received the following chemotherapy and/or immunotherapy agents: azacitidine.      To help prevent nausea and vomiting after your treatment, we encourage you to take your nausea medication as directed.  BELOW ARE SYMPTOMS THAT SHOULD BE REPORTED IMMEDIATELY: *FEVER GREATER THAN 100.4 F (38 C) OR HIGHER *CHILLS OR SWEATING *NAUSEA AND VOMITING THAT IS NOT CONTROLLED WITH YOUR NAUSEA MEDICATION *UNUSUAL SHORTNESS OF BREATH *UNUSUAL BRUISING OR BLEEDING *URINARY PROBLEMS (pain or burning when urinating, or frequent urination) *BOWEL PROBLEMS (unusual diarrhea, constipation, pain near the anus) TENDERNESS IN MOUTH AND THROAT WITH OR WITHOUT PRESENCE OF ULCERS (sore throat, sores in mouth, or a toothache) UNUSUAL RASH, SWELLING OR PAIN  UNUSUAL VAGINAL DISCHARGE OR ITCHING   Items with * indicate a potential emergency and should be followed up as soon as possible or go to the Emergency Department if any problems should occur.  Please show the CHEMOTHERAPY ALERT CARD or IMMUNOTHERAPY ALERT CARD at  check-in to the Emergency Department and triage nurse.  Should you have questions after your visit or need to cancel or reschedule your appointment, please contact [facility]  Dept: [phone]  and follow the prompts.  Office hours are 8:00 a.m. to 4:30 p.m. Monday - Friday. Please note that voicemails left after 4:00 p.m. may not be returned until the following business day.  We are closed weekends and major holidays. You have access to a nurse at all times for urgent questions. Please call the main number to the clinic Dept: [phone] and follow the prompts.   For any non-urgent questions, you may also contact your provider using MyChart. We now offer e-Visits for anyone 72 and older to request care online for non-urgent symptoms. For details visit [email or web address].   Also download the MyChart app! Go to the app store, search "MyChart", open the app, select [facility], and log in with your MyChart username and password.  Due to Covid, a mask is required upon entering the hospital/clinic. If you do not have a mask, one will be given to you upon arrival. For doctor visits, patients may have 1 support person aged 60 or older with them. For treatment visits, patients cannot have anyone with them due to current Covid guidelines and our immunocompromised population.   Azacitidine suspension for injection (subcutaneous use) Qu es [employer]? La AZACITIDINA es un agente quimioteraputico. Este medicamento reduce el crecimiento de clulas cancerosas y puede suprimir el sistema inmunolgico. Se utiliza tambin para el tratamiento de sndrome mielodisplsticos o algunos tipos de leucemia. [place] ser  utilizado para otros usos; si tiene alguna pregunta consulte con su proveedor de atencin mdica o con su farmacutico. MARCAS COMUNES: [place] Qu le debo informar a mi profesional de la salud antes de tomar este medicamento? Necesitan saber si  usted presenta alguno de los siguientes problemas o situaciones: enfermedad renal enfermedad heptica tumores hepticos una reaccin alrgica o inusual a la azacitidina, al manitol, a otros medicamentos, alimentos, colorantes o conservantes si est embarazada o buscando quedar embarazada si est amamantando a un beb Cmo debo utilizar este medicamento? Este medicamento se administra mediante inyeccin por va subcutnea. Lo administra un profesional de la salud calificado en un hospital o en un entorno clnico. Hable con su pediatra para informarse acerca del uso de este medicamento en nios. Puede requerir atencin especial. Sobredosis: Pngase en contacto inmediatamente con un centro toxicolgico o una sala de urgencia si usted cree que haya tomado demasiado medicamento. ATENCIN: [employer] es solo para usted. No comparta este medicamento con nadie. Qu sucede si me olvido de una dosis? Es importante no olvidar ninguna dosis. Informe a su mdico o a su profesional de la salud si no puede asistir a Photographer. Qu puede interactuar con este medicamento? No se han estudiado las interacciones. Suministre a Conservation officer, historic buildings de atencin mdica una lista de todos los [employer], hierbas, medicamentos de [place], o suplementos dietticos que [place]. Dgales tambin si fuma, bebe alcohol, o utiliza drogas ilegales. Algunos elementos pueden interactuar con su medicamento. Puede ser que esta lista no menciona todas las posibles interacciones. Informe a su profesional de [employer]	[place] de [employer] productos a base de hierbas, medicamentos de [place] o suplementos nutritivos que est tomando. Si usted fuma, consume bebidas alcohlicas o si utiliza drogas ilegales, indqueselo tambin a su profesional de [employer]	[place]. Algunas sustancias pueden interactuar con su medicamento. A qu debo estar atento al usar Coca[employer]a? Visite a su mdico para que revise su evolucin. Este medicamento podra  hacerle sentir un Nurse, mental health. Esto es normal ya que la quimioterapia puede Print production planner tanto a las clulas sanas como a las clulas cancerosas. Si presenta algn efecto secundario, infrmelo. Contine con el tratamiento aun si se siente enfermo, a menos que su mdico le indique que lo suspenda. En algunos casos, podra recibir [employer] para ayudarlo con los efectos secundarios. Siga todas las instrucciones para usarlos. Consulte a su mdico o a su profesional de la salud si tiene fiebre, escalofros o dolor de garganta, o cualquier otro sntoma de resfro o gripe. No se trate usted mismo. Este medicamento reduce la capacidad del cuerpo para combatir infecciones. Trate de no acercarse a personas que estn enfermas. Este medicamento podra aumentar el riesgo de moretones o sangrado. Consulte a su mdico o a su profesional de la salud si observa sangrados inusuales. Usted podra necesitar realizarse [employer] de sangre mientras est usando [place]. No debe quedar embarazada mientras est tomando este medicamento y por 6 meses despus de la ltima dosis. Las mujeres deben informar a su mdico si estn buscando quedar embarazadas o si creen que podran estar embarazadas. Los hombres no deben tener hijos mientras estn recibiendo Coca[employer]a y durante 3 meses despus de la ltima dosis. Existe la posibilidad de efectos secundarios graves en un beb sin nacer. Para obtener ms informacin, hable con su profesional de la salud o su farmacutico. No debe amamantar a un beb mientras est usando este medicamento y por 1 semana despus de la ltima dosis. Este medicamento puede interferir  con la capacidad de tener hijos. Hable con su mdico o su profesional de la salud si est preocupado por su fertilidad. Qu efectos secundarios puedo tener al [employer] este medicamento? Efectos secundarios que debe informar a su mdico o a Barrister's clerk de la salud tan pronto como sea  posible: Chief of Staff, como erupcin cutnea, comezn/picazn o urticarias, e hinchazn de la cara, los labios o la lengua recuentos sanguneos bajos: este medicamento podra reducir la cantidad de glbulos blancos, glbulos rojos y plaquetas. Su riesgo de infeccin y sangrado podra ser mayor. signos de infeccin: fiebre o escalofros, tos, dolor de garganta, dolor al orinar signos de disminucin en la cantidad de plaquetas o sangrado: moretones, puntos rojos en la piel, heces de color negro y aspecto alquitranado, sangre en la orina signos de disminucin en la cantidad de glbulos rojos: cansancio o debilidad inusual, Youth worker, aturdimiento signos y sntomas de lesin al rin, tales como dificultad para orinar o cambios en la cantidad de orina signos y sntomas de lesin al hgado, como orina amarilla oscura o [place]; sensacin general de estar enfermo o sntomas gripales; heces claras; prdida de apetito; nuseas; dolor en la regin abdominal superior derecha; cansancio o debilidad inusual; color amarillento de los ojos o la piel Efectos secundarios que generalmente no requieren atencin mdica (infrmelos a su mdico o a su profesional de la salud si persisten o si son molestos): estreimiento diarrea nuseas, vmito dolor o enrojecimiento en TEFL teacher de la inyeccin cansancio o debilidad inusual Puede ser que esta lista no menciona todos los posibles efectos secundarios. Comunquese a su mdico por asesoramiento mdico [employer]. Usted puede informar los efectos secundarios a la FDA por telfono al 1-800-FDA-1088. Dnde debo guardar mi medicina? Este medicamento se administra en hospitales o clnicas y no necesitar guardarlo en su domicilio. ATENCIN: Este folleto es un resumen. Puede ser que no cubra toda la posible informacin. Si usted tiene preguntas acerca de esta medicina, consulte con su mdico, su farmacutico o su profesional de Technical sales engineer.  2022  Elsevier/Gold Standard (2017-02-06 00:00:00)

## 2021-12-04 NOTE — Progress Notes (Signed)
Ok to treat today using lab values from 11/27/21 per Dr Burr Medico. Ok to treat with elevated HR today as long as EKG shows Sinus Tach and not A-fib per Dr Burr Medico. EKG performed which shows Sinus Tachycardia at 128BPM.

## 2021-12-04 NOTE — Progress Notes (Signed)
Ok to treat

## 2021-12-05 ENCOUNTER — Other Ambulatory Visit: Payer: Self-pay

## 2021-12-05 ENCOUNTER — Observation Stay (HOSPITAL_COMMUNITY)
Admission: EM | Admit: 2021-12-05 | Discharge: 2021-12-06 | Disposition: A | Discharge status: Home / Self Care | Payer: Medicare Other | Attending: Internal Medicine | Admitting: Internal Medicine

## 2021-12-05 ENCOUNTER — Emergency Department (HOSPITAL_COMMUNITY): Payer: Medicare Other

## 2021-12-05 ENCOUNTER — Encounter (HOSPITAL_COMMUNITY): Payer: Self-pay

## 2021-12-05 ENCOUNTER — Inpatient Hospital Stay: Payer: Medicare Other

## 2021-12-05 VITALS — BP 97/68 | HR 134 | Temp 99.9°F | Resp 16

## 2021-12-05 DIAGNOSIS — R000 Tachycardia, unspecified: Secondary | ICD-10-CM | POA: Insufficient documentation

## 2021-12-05 DIAGNOSIS — R55 Syncope and collapse: Secondary | ICD-10-CM | POA: Diagnosis not present

## 2021-12-05 DIAGNOSIS — R109 Unspecified abdominal pain: Secondary | ICD-10-CM | POA: Insufficient documentation

## 2021-12-05 DIAGNOSIS — Z79899 Other long term (current) drug therapy: Secondary | ICD-10-CM | POA: Diagnosis not present

## 2021-12-05 DIAGNOSIS — R2681 Unsteadiness on feet: Secondary | ICD-10-CM | POA: Insufficient documentation

## 2021-12-05 DIAGNOSIS — N183 Chronic kidney disease, stage 3 unspecified: Secondary | ICD-10-CM | POA: Diagnosis not present

## 2021-12-05 DIAGNOSIS — Z20822 Contact with and (suspected) exposure to covid-19: Secondary | ICD-10-CM | POA: Insufficient documentation

## 2021-12-05 DIAGNOSIS — I129 Hypertensive chronic kidney disease with stage 1 through stage 4 chronic kidney disease, or unspecified chronic kidney disease: Secondary | ICD-10-CM | POA: Insufficient documentation

## 2021-12-05 DIAGNOSIS — R079 Chest pain, unspecified: Secondary | ICD-10-CM | POA: Diagnosis not present

## 2021-12-05 DIAGNOSIS — E860 Dehydration: Secondary | ICD-10-CM

## 2021-12-05 DIAGNOSIS — D469 Myelodysplastic syndrome, unspecified: Secondary | ICD-10-CM

## 2021-12-05 DIAGNOSIS — Z7984 Long term (current) use of oral hypoglycemic drugs: Secondary | ICD-10-CM | POA: Diagnosis not present

## 2021-12-05 DIAGNOSIS — E1122 Type 2 diabetes mellitus with diabetic chronic kidney disease: Secondary | ICD-10-CM | POA: Insufficient documentation

## 2021-12-05 DIAGNOSIS — D649 Anemia, unspecified: Secondary | ICD-10-CM

## 2021-12-05 LAB — CBC WITH DIFFERENTIAL/PLATELET
Abs Immature Granulocytes: 0.48 10*3/uL — ABNORMAL HIGH (ref 0.00–0.07)
Basophils Absolute: 0 10*3/uL (ref 0.0–0.1)
Basophils Relative: 1 %
Eosinophils Absolute: 0 10*3/uL (ref 0.0–0.5)
Eosinophils Relative: 0 %
HCT: 21.5 % — ABNORMAL LOW (ref 39.0–52.0)
Hemoglobin: 7.3 g/dL — ABNORMAL LOW (ref 13.0–17.0)
Immature Granulocytes: 13 %
Lymphocytes Relative: 26 %
Lymphs Abs: 1 10*3/uL (ref 0.7–4.0)
MCH: 31.3 pg (ref 26.0–34.0)
MCHC: 34 g/dL (ref 30.0–36.0)
MCV: 92.3 fL (ref 80.0–100.0)
Monocytes Absolute: 1.3 10*3/uL — ABNORMAL HIGH (ref 0.1–1.0)
Monocytes Relative: 32 %
Neutro Abs: 1.1 10*3/uL — ABNORMAL LOW (ref 1.7–7.7)
Neutrophils Relative %: 28 %
Platelets: 77 10*3/uL — ABNORMAL LOW (ref 150–400)
RBC: 2.33 MIL/uL — ABNORMAL LOW (ref 4.22–5.81)
RDW: 15.9 % — ABNORMAL HIGH (ref 11.5–15.5)
WBC: 3.9 10*3/uL — ABNORMAL LOW (ref 4.0–10.5)
nRBC: 0 % (ref 0.0–0.2)

## 2021-12-05 LAB — CMP (CANCER CENTER ONLY)
ALT: 12 U/L (ref 0–44)
AST: 16 U/L (ref 15–41)
Albumin: 3.6 g/dL (ref 3.5–5.0)
Alkaline Phosphatase: 110 U/L (ref 38–126)
Anion gap: 11 (ref 5–15)
BUN: 14 mg/dL (ref 8–23)
CO2: 26 mmol/L (ref 22–32)
Calcium: 9.2 mg/dL (ref 8.9–10.3)
Chloride: 99 mmol/L (ref 98–111)
Creatinine: 1.18 mg/dL (ref 0.61–1.24)
GFR, Estimated: >60 mL/min (ref >60)
Glucose, Bld: 208 mg/dL — ABNORMAL HIGH (ref 70–99)
Potassium: 3.9 mmol/L (ref 3.5–5.1)
Sodium: 136 mmol/L (ref 135–145)
Total Bilirubin: 1 mg/dL (ref 0.3–1.2)
Total Protein: 6.4 g/dL — ABNORMAL LOW (ref 6.5–8.1)

## 2021-12-05 LAB — CBC WITH DIFFERENTIAL (CANCER CENTER ONLY)
Abs Immature Granulocytes: 0.6 10*3/uL — ABNORMAL HIGH (ref 0.00–0.07)
Band Neutrophils: 5 %
Basophils Absolute: 0 10*3/uL (ref 0.0–0.1)
Basophils Relative: 0 %
Blasts: 5 %
Eosinophils Absolute: 0 10*3/uL (ref 0.0–0.5)
Eosinophils Relative: 0 %
HCT: 20.7 % — ABNORMAL LOW (ref 39.0–52.0)
Hemoglobin: 7 g/dL — ABNORMAL LOW (ref 13.0–17.0)
Lymphocytes Relative: 48 %
Lymphs Abs: 1.9 10*3/uL (ref 0.7–4.0)
MCH: 30.3 pg (ref 26.0–34.0)
MCHC: 33.8 g/dL (ref 30.0–36.0)
MCV: 89.6 fL (ref 80.0–100.0)
Metamyelocytes Relative: 6 %
Monocytes Absolute: 0.4 10*3/uL (ref 0.1–1.0)
Monocytes Relative: 11 %
Myelocytes: 9 %
Neutro Abs: 0.8 10*3/uL — ABNORMAL LOW (ref 1.7–7.7)
Neutrophils Relative %: 15 %
Platelet Count: 86 10*3/uL — ABNORMAL LOW (ref 150–400)
Promyelocytes Relative: 1 %
RBC: 2.31 MIL/uL — ABNORMAL LOW (ref 4.22–5.81)
RDW: 15.8 % — ABNORMAL HIGH (ref 11.5–15.5)
WBC Count: 4 10*3/uL (ref 4.0–10.5)
nRBC: 0.5 % — ABNORMAL HIGH (ref 0.0–0.2)

## 2021-12-05 LAB — D-DIMER, QUANTITATIVE: D-Dimer, Quant: 2.22 ug/mL-FEU — ABNORMAL HIGH (ref 0.00–0.50)

## 2021-12-05 LAB — RESP PANEL BY RT-PCR (FLU A&B, COVID) ARPGX2
Influenza A by PCR: NEGATIVE
Influenza B by PCR: NEGATIVE
SARS Coronavirus 2 by RT PCR: NEGATIVE

## 2021-12-05 LAB — GLUCOSE, CAPILLARY: Glucose-Capillary: 111 mg/dL — ABNORMAL HIGH (ref 70–99)

## 2021-12-05 LAB — HEMOGLOBIN AND HEMATOCRIT, BLOOD
HCT: 26.1 % — ABNORMAL LOW (ref 39.0–52.0)
Hemoglobin: 9.2 g/dL — ABNORMAL LOW (ref 13.0–17.0)

## 2021-12-05 LAB — TROPONIN I (HIGH SENSITIVITY)
Troponin I (High Sensitivity): 6 ng/L (ref <18)
Troponin I (High Sensitivity): 5 ng/L (ref <18)

## 2021-12-05 LAB — CBG MONITORING, ED
Glucose-Capillary: 149 mg/dL — ABNORMAL HIGH (ref 70–99)
Glucose-Capillary: 112 mg/dL — ABNORMAL HIGH (ref 70–99)

## 2021-12-05 LAB — PREPARE RBC (CROSSMATCH)

## 2021-12-05 LAB — LACTIC ACID, PLASMA: Lactic Acid, Venous: 3.8 mmol/L (ref 0.5–1.9)

## 2021-12-05 MED ORDER — PANTOPRAZOLE SODIUM 40 MG PO TBEC
40.0000 mg | DELAYED_RELEASE_TABLET | Freq: Every day | ORAL | Status: DC
  Administered 2021-12-05 – 2021-12-06 (×2): 40 mg via ORAL
  Filled 2021-12-05 (×2): qty 1

## 2021-12-05 MED ORDER — INSULIN ASPART 100 UNIT/ML IJ SOLN
0.0000 [IU] | Freq: Three times a day (TID) | INTRAMUSCULAR | Status: DC
  Administered 2021-12-05 – 2021-12-06 (×2): 2 [IU] via SUBCUTANEOUS
  Administered 2021-12-06: 13:00:00 3 [IU] via SUBCUTANEOUS
  Filled 2021-12-05: qty 0.15

## 2021-12-05 MED ORDER — METOCLOPRAMIDE HCL 5 MG/ML IJ SOLN
10.0000 mg | Freq: Once | INTRAMUSCULAR | Status: AC
  Administered 2021-12-05: 14:00:00 10 mg via INTRAVENOUS
  Filled 2021-12-05: qty 2

## 2021-12-05 MED ORDER — SODIUM CHLORIDE 0.9 % IV SOLN: INTRAVENOUS | Status: DC

## 2021-12-05 MED ORDER — ONDANSETRON HCL 8 MG PO TABS
8.0000 mg | ORAL_TABLET | Freq: Once | ORAL | Status: AC
  Administered 2021-12-05: 09:00:00 8 mg via ORAL
  Filled 2021-12-05: qty 1

## 2021-12-05 MED ORDER — INSULIN ASPART 100 UNIT/ML IJ SOLN
0.0000 [IU] | Freq: Every day | INTRAMUSCULAR | Status: DC
  Filled 2021-12-05: qty 0.05

## 2021-12-05 MED ORDER — SODIUM CHLORIDE 0.9% FLUSH: 3.0000 mL | Freq: Two times a day (BID) | INTRAVENOUS | Status: DC

## 2021-12-05 MED ORDER — LACTATED RINGERS IV BOLUS
1000.0000 mL | Freq: Once | INTRAVENOUS | Status: AC
  Administered 2021-12-05: 12:00:00 1000 mL via INTRAVENOUS

## 2021-12-05 MED ORDER — SODIUM CHLORIDE (PF) 0.9 % IJ SOLN
INTRAMUSCULAR | Status: AC
  Filled 2021-12-05: qty 50

## 2021-12-05 MED ORDER — ONDANSETRON HCL 4 MG/2ML IJ SOLN: 4.0000 mg | Freq: Four times a day (QID) | INTRAMUSCULAR | Status: DC | PRN

## 2021-12-05 MED ORDER — ACETAMINOPHEN 650 MG RE SUPP: 650.0000 mg | Freq: Four times a day (QID) | RECTAL | Status: DC | PRN

## 2021-12-05 MED ORDER — ONDANSETRON HCL 4 MG PO TABS: 4.0000 mg | ORAL_TABLET | Freq: Four times a day (QID) | ORAL | Status: DC | PRN

## 2021-12-05 MED ORDER — ACETAMINOPHEN 325 MG PO TABS
650.0000 mg | ORAL_TABLET | Freq: Four times a day (QID) | ORAL | Status: DC | PRN
  Filled 2021-12-05: qty 2

## 2021-12-05 MED ORDER — ALUM & MAG HYDROXIDE-SIMETH 200-200-20 MG/5ML PO SUSP
30.0000 mL | Freq: Once | ORAL | Status: AC
  Administered 2021-12-05: 16:00:00 30 mL via ORAL
  Filled 2021-12-05: qty 30

## 2021-12-05 MED ORDER — AZACITIDINE CHEMO SQ INJECTION
75.0000 mg/m2 | Freq: Once | INTRAMUSCULAR | Status: DC
  Filled 2021-12-05: qty 4.7

## 2021-12-05 MED ORDER — SODIUM CHLORIDE 0.9 % IV SOLN
10.0000 mL/h | Freq: Once | INTRAVENOUS | Status: AC
  Administered 2021-12-05: 16:00:00 10 mL/h via INTRAVENOUS

## 2021-12-05 MED ORDER — IOHEXOL 350 MG/ML SOLN
90.0000 mL | Freq: Once | INTRAVENOUS | Status: AC | PRN
  Administered 2021-12-05: 11:00:00 90 mL via INTRAVENOUS

## 2021-12-05 MED ORDER — LACTATED RINGERS IV BOLUS
1000.0000 mL | Freq: Once | INTRAVENOUS | Status: AC
  Administered 2021-12-05: 10:00:00 1000 mL via INTRAVENOUS

## 2021-12-05 NOTE — ED Notes (Signed)
Pt able to stand and use bedside commode. Pt complained of no dizziness.

## 2021-12-05 NOTE — Progress Notes (Signed)
Ok to treat today with elevated HR per Dr Burr Medico. Dr Burr Medico has evaluated patient at chariside.

## 2021-12-05 NOTE — ED Provider Notes (Signed)
[facility] DEPT Provider Note   CSN: [id number] Arrival date & time: 12/05/21  6270     History Chief Complaint  Patient presents with   Loss of Consciousness    Eugene Cooper is a 80 y.o. male.  The history is provided by the patient and a relative. The history is limited by a language barrier. A language interpreter was used.  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 yesterday, CKD, hypertension, hyperlipidemia, diabetes and stroke     Past Medical History:  Diagnosis Date   CKD (chronic kidney disease), stage III ([place])    Diabetes ([address])    Hyperlipemia    Hypertension    LBBB (left bundle branch block)    Stroke [facility])     Patient Active Problem List   Diagnosis Date Noted   Malnutrition of moderate degree 11/09/2021   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[place])    Neutropenic fever (HCC)    Pancytopenia (HCC)    Knee pain    Sepsis with acute renal failure and septic shock (HCC)    Symptomatic anemia    Fever of unknown origin    Pneumonia of left lower lobe due to infectious organism    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    Altered mental status    Acute blood loss anemia 10/28/2021   Hypotension 10/28/2021   Atrial fibrillation with RVR ([place]) 10/28/2021   Hyperkalemia 35/00/9381   Metabolic acidosis, increased anion gap  (IAG) 82/99/3716   Acute metabolic encephalopathy 96/78/9381   AKI (acute kidney injury) ([place]) 10/28/2021   History of stroke 10/28/2021   Pain in left shoulder 06/04/2021   Gout 06/04/2021   Elevated BUN    Benign essential HTN    Controlled type 2 diabetes mellitus with hyperglycemia, without long-term current use of insulin (HCC)    Labile blood glucose    Neuropathic pain    Acute ischemic stroke ([place]) 12/22/2019   Uncontrolled type 2 diabetes mellitus with hyperglycemia, without long-term current use of insulin ([place]) 12/22/2019   Mixed hyperlipidemia 12/22/2019   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   Essential hypertension    Left bundle branch block    Stroke ([place]) 12/20/2019   Right sided weakness 12/19/2019    Past Surgical History:  Procedure Laterality Date   BIOPSY  10/29/2021   Procedure: BIOPSY;  Surgeon: Thornton Park, MD;  Location: [facility];  Service: Gastroenterology;;   ESOPHAGOGASTRODUODENOSCOPY (EGD) WITH PROPOFOL N/A 10/29/2021   Procedure: ESOPHAGOGASTRODUODENOSCOPY (EGD) WITH PROPOFOL;  Surgeon: Thornton Park, MD;  Location: [facility] ENDOSCOPY;  Service: Gastroenterology;  Laterality: N/A;       Family History  Problem Relation Age of Onset   Stroke Mother    Lung disease Father    Diabetes Sister    Heart disease Sister    Hyperlipidemia Son    Hyperlipidemia Daughter    Hypertension Daughter     Social History   Tobacco  Use   Smoking status: Never   Smokeless tobacco: Never  Vaping Use   Vaping Use: Never used  Substance Use Topics   Alcohol use: Not Currently   Drug use: Never    Home Medications Prior to Admission medications   Medication Sig Start Date End Date Taking? Authorizing Provider  Accu-Chek Softclix Lancets lancets USE UPTO FOUR TIMES DAILY AS DIRECTED 01/08/20   [provider]  acetaminophen (TYLENOL) 325 MG tablet Take 1-2 tablets (325-650 mg total) by mouth every 4 (four) hours as needed for mild pain.  01/07/20   Love, Ivan Anchors, PA-C  allopurinol (ZYLOPRIM) 300 MG tablet Take 1 tablet (300 mg total) by mouth daily. 11/23/21   Rice, Resa Miner, MD  atorvastatin (LIPITOR) 40 MG tablet Take 1 tablet (40 mg total) by mouth daily at 6 PM. 01/07/20   Love, Ivan Anchors, PA-C  blood glucose meter kit and supplies KIT Dispense based on patient and insurance preference. Use up to four times daily as directed. (FOR ICD-9 250.00, 250.01). 01/07/20   Love, Ivan Anchors, PA-C  colchicine 0.6 MG tablet Take 1 tablet (0.6 mg total) by mouth daily. 11/11/21   Regalado, Belkys A, MD  diclofenac Sodium (VOLTAREN) 1 % GEL Apply 2 g topically 4 (four) times daily. 11/10/21   Regalado, Belkys A, MD  magnesium oxide (MAG-OX) 400 (240 Mg) MG tablet Take 0.5 tablets (200 mg total) by mouth 2 (two) times daily. Patient not taking: Reported on 11/29/2021 11/10/21   Regalado, Jerald Kief A, MD  Menthol-Methyl Salicylate (MUSCLE RUB) 10-15 % CREA Apply 1 application topically 2 (two) times daily as needed for muscle pain. To lower back 01/07/20   Love, Ivan Anchors, PA-C  metFORMIN (GLUCOPHAGE) 1000 MG tablet Take 1,000 mg by mouth 2 (two) times daily. 02/29/20   [provider]  Multiple Vitamin (MULTIVITAMIN WITH MINERALS) TABS tablet Take 1 tablet by mouth daily. 01/07/20   Love, Ivan Anchors, PA-C  ondansetron (ZOFRAN) 8 MG tablet Take 1 tablet (8 mg total) by mouth 2 (two) times daily as needed (Nausea or vomiting). Patient not taking: Reported on 11/29/2021 11/27/21   Truitt Merle, MD  pantoprazole (PROTONIX) 40 MG tablet Take 1 tablet (40 mg total) by mouth 2 (two) times daily. 11/10/21 11/10/22  Regalado, Cassie Freer, MD    Allergies    Patient has no known allergies.  Review of Systems   Review of Systems  Constitutional:  Negative for fever.  Respiratory:  Negative for shortness of breath.   Cardiovascular:  Positive for chest pain and syncope. Negative for palpitations.  Gastrointestinal:  Positive for nausea. Negative for vomiting.   Neurological:  Positive for dizziness and weakness. Negative for headaches.  All other systems reviewed and are negative.  Physical Exam Updated Vital Signs BP 101/65 (BP Location: Left Arm)    Pulse (!) 101    Temp 98.3 F (36.8 C) (Oral)    Resp 17    Ht _0  (1.575 m)    Wt 58.1 kg    SpO2 99%    BMI 23.41 kg/m   Physical Exam Vitals and nursing note reviewed.  Constitutional:      General: He is not in acute distress.    Appearance: He is well-developed. He is ill-appearing.  HENT:     Head: Normocephalic and atraumatic.     Mouth/Throat:     Mouth: Mucous membranes are moist.  Eyes:     Conjunctiva/sclera: Conjunctivae normal.     Pupils:  Pupils are equal, round, and reactive to light.  Cardiovascular:     Rate and Rhythm: Regular rhythm. Tachycardia present.     Heart sounds: No murmur heard. Pulmonary:     Effort: Pulmonary effort is normal. No respiratory distress.     Breath sounds: Normal breath sounds. No wheezing or rales.  Abdominal:     General: There is no distension.     Palpations: Abdomen is soft.     Tenderness: There is abdominal tenderness in the epigastric area. There is no guarding or rebound.  Musculoskeletal:        General: No tenderness. Normal range of motion.     Cervical back: Normal range of motion and neck supple.     Right lower leg: No edema.     Left lower leg: No edema.  Skin:    General: Skin is warm and dry.     Findings: No erythema or rash.  Neurological:     Mental Status: He is alert and oriented to person, place, and time. Mental status is at baseline.  Psychiatric:        Mood and Affect: Mood normal.        Behavior: Behavior normal.    ED Results / Procedures / Treatments   Labs (all labs ordered are listed, but only abnormal results are displayed) Labs Reviewed  D-DIMER, QUANTITATIVE - Abnormal; Notable for the following components:      Result Value   D-Dimer, Quant 2.22 (*)    All other components within normal  limits  LACTIC ACID, PLASMA - Abnormal; Notable for the following components:   Lactic Acid, Venous 3.8 (*)    All other components within normal limits  CBC WITH DIFFERENTIAL/PLATELET - Abnormal; Notable for the following components:   WBC 3.9 (*)    RBC 2.33 (*)    Hemoglobin 7.3 (*)    HCT 21.5 (*)    RDW 15.9 (*)    Platelets 77 (*)    Neutro Abs 1.1 (*)    Monocytes Absolute 1.3 (*)    Abs Immature Granulocytes 0.48 (*)    All other components within normal limits  RESP PANEL BY RT-PCR (FLU A&B, COVID) ARPGX2  TYPE AND SCREEN  PREPARE RBC (CROSSMATCH)  TROPONIN I (HIGH SENSITIVITY)  TROPONIN I (HIGH SENSITIVITY)    EKG EKG Interpretation  Date/Time:  Wednesday December 05 2021 09:50:49 EST Ventricular Rate:  111 PR Interval:  155 QRS Duration: 139 QT Interval:  345 QTC Calculation: 469 R Axis:   32 Text Interpretation: Sinus tachycardia Left bundle branch block Baseline wander in lead(s) V2 No significant change since last tracing Confirmed by Blanchie Dessert [phone]) on 12/05/2021 10:00:13 AM  Radiology CT Angio Chest PE W and/or Wo Contrast  Result Date: 12/05/2021 CLINICAL DATA:  Pulmonary embolism (PE) suspected, positive D-dimer EXAM: CT ANGIOGRAPHY CHEST WITH CONTRAST TECHNIQUE: Multidetector CT imaging of the chest was performed using the standard protocol during bolus administration of intravenous contrast. Multiplanar CT image reconstructions and MIPs were obtained to evaluate the vascular anatomy. CONTRAST:  17m OMNIPAQUE IOHEXOL 350 MG/ML SOLN COMPARISON:  11/04/2021 FINDINGS: Cardiovascular: Satisfactory opacification of the pulmonary arteries to the segmental level. No evidence of pulmonary embolism. Normal heart size. No pericardial effusion. Coronary artery calcification. Thoracic aorta is normal in caliber with calcified plaque. Mediastinum/Nodes: No enlarged nodes. Subcentimeter calcified right thyroid nodule for which no ultrasound follow-up is  recommended. Unremarkable esophagus. Lungs/Pleura: No pleural effusion or pneumothorax. Mild bibasilar atelectasis. Stable 6  mm nodule of the lingula (see previous follow-up recommendations). Upper Abdomen: No acute abnormality. Musculoskeletal: No acute abnormality. Review of the MIP images confirms the above findings. IMPRESSION: No acute pulmonary embolism or other acute abnormality. Resolution of small pleural effusions. Mild bibasilar atelectasis. Electronically Signed   By: Macy Mis M.D.   On: 12/05/2021 11:39   DG Chest Port 1 View  Result Date: 12/05/2021 CLINICAL DATA:  Syncope EXAM: PORTABLE CHEST 1 VIEW COMPARISON:  Chest x-ray 11/29/2021 FINDINGS: Cardiomegaly and mediastinum appear unchanged. Calcified plaques in the aortic arch. Pulmonary vasculature is normal. No focal consolidation identified. No pleural effusion or pneumothorax. IMPRESSION: Cardiomegaly with no acute process identified. Electronically Signed   By: Ofilia Neas M.D.   On: 12/05/2021 10:33    Procedures Procedures   Medications Ordered in ED Medications  sodium chloride (PF) 0.9 % injection (has no administration in time range)  0.9 %  sodium chloride infusion (has no administration in time range)  lactated ringers bolus 1,000 mL (0 mLs Intravenous Stopped 12/05/21 1043)  lactated ringers bolus 1,000 mL (1,000 mLs Intravenous New Bag/Given 12/05/21 1141)  iohexol (OMNIPAQUE) 350 MG/ML injection 90 mL (90 mLs Intravenous Contrast Given 12/05/21 1110)    ED Course  I have reviewed the triage vital signs and the nursing notes.  Pertinent labs & imaging results that were available during my care of the patient were reviewed by me and considered in my medical decision making (see chart for details).    MDM Rules/Calculators/A&P                         Patient is an 80 year old male presenting today after a syncopal event while at the cancer center.  Patient just started chemotherapy infusion yesterday  and was returning today for a second infusion.  He did complain of some nausea this morning but otherwise his granddaughter reported he was his normal self.  After having his blood drawn at the cancer center his heart rate was elevated and they got him up and tried to walk him around and he reported he did not feel well and then had a syncopal event.  Upon arrival here patient still complains of nausea but prior to my arrival told the nurse he was having chest pain although he denies that with me.  He is still having some nausea and some mild epigastric discomfort.  Patient is hypotensive here at 89/72.  He is tachycardic between 110-120.  Oxygen saturation is 100% and he does not appear to be in respiratory distress.  He has no evidence of fluid overload at this time.  Concern for dehydration and orthostatic hypotension with syncope versus acute anemia and internal bleeding versus PE versus MI.  Also will get an EKG to ensure that patient is regular rhythm as he does have a prior history of atrial fibrillation.  Based on his most recent medication list patient does not take blood pressure medication.  Patient was recently seen in the emergency department last week for chest pain and shortness of breath with exertion.  He ruled out by troponins however concern for PE versus symptomatic anemia.  Patient had blood work done today at the cancer center which showed stable CMP and CBC today with decreasing hemoglobin now at 7 from hemoglobin of 8  6 days ago.  12:12 PM Patient's D-dimer was elevated today at 2.22 however CTA was negative for PE.  Lactic acid was elevated at 3.8 and patient  received 2 L of LR.  Repeat hemoglobin was consistent at 7.3 given and has continued to drop and being hypotensive tachycardic and having chest pain earlier concern for symptomatic anemia.  He was given 2 units of blood.  We will plan on admitting for further care.  Low suspicion for infectious etiology today.  Initial troponin was  in normal limits and flu and COVID are negative.  Patient is still having nausea on exam which may be related to recent chemoinfusion yesterday.  Heart rate has improved to the upper 90s but blood pressure remains 98 over 60s.  I independently interpreted CXR which was normal.  Findings discussed with the patient and his granddaughter and they are comfortable with this plan.  MDM   Amount and/or Complexity of Data Reviewed Clinical lab tests: ordered and reviewed Tests in the radiology section of CPT: ordered and reviewed Tests in the medicine section of CPT: ordered and reviewed Decide to obtain previous medical records or to obtain history from someone other than the patient: yes Obtain history from someone other than the patient: yes Discuss the patient with other providers: yes Independent visualization of images, tracings, or specimens: yes  Risk of Complications, Morbidity, and/or Mortality Presenting problems: high Diagnostic procedures: high Management options: high   CRITICAL CARE Performed by: Kamrie Fanton Total critical care time: 30 minutes Critical care time was exclusive of separately billable procedures and treating other patients. Critical care was necessary to treat or prevent imminent or life-threatening deterioration. Critical care was time spent personally by me on the following activities: development of treatment plan with patient and/or surrogate as well as nursing, discussions with consultants, evaluation of patient's response to treatment, examination of patient, obtaining history from patient or surrogate, ordering and performing treatments and interventions, ordering and review of laboratory studies, ordering and review of radiographic studies, pulse oximetry and re-evaluation of patient's condition.     Final Clinical Impression(s) / ED Diagnoses Final diagnoses:  Symptomatic anemia  Dehydration  Syncope, unspecified syncope type    Rx / DC  Orders ED Discharge Orders     None        Blanchie Dessert, MD 12/05/21 1214

## 2021-12-05 NOTE — ED Notes (Signed)
Pt rhythm morphology appears different, ekg taken and given to ed md,

## 2021-12-05 NOTE — ED Notes (Signed)
MD at bedside, pt denies pain at this time.

## 2021-12-05 NOTE — H&P (Signed)
History and Physical    [name] URK:[id number] DOB: January 08, 1941 DOA: 12/05/2021  PCP: Emelia Loron, NP  Patient coming from: Cancer center  Chief Complaint: syncope  HPI: Eugene Cooper is a 80 y.o. male with medical history significant of HTN, MDS, HLD, DM2. Presenting w/ syncopal episode. History is from patient and daughter. He was at the cancer center this morning. He was about to get chemo, when he began to feel nauseous and weak. The staff tried to walk him, but his heart rate rose. He passed out. The daughter is unaware of how long he was down. He did not have any tonic-clonic movements. Neither did he have incontinence. They became concerned and sent him to the ED for evaluation.  ED Course: He was found to be hypotensive. He was tachycardic. He had a drop in his Hgb. 2 units of pRBCs were ordered. His trp were negative. EKG showed sinus tach w/ a known LBBB. TRH was called for admission.   Review of Systems:  Reports intermittent CP, intermittent epigastric pain. Denies dyspnea, palpitations, diarrhea, fever, sick contacts. Review of systems is otherwise negative for all not mentioned in HPI.   PMHx Past Medical History:  Diagnosis Date   CKD (chronic kidney disease), stage III ([place])    Diabetes ([place])    Hyperlipemia    Hypertension    LBBB (left bundle branch block)    Stroke [facility]     PSHx Past Surgical History:  Procedure Laterality Date   BIOPSY  10/29/2021   Procedure: BIOPSY;  Surgeon: Thornton Park, MD;  Location: [place];  Service: Gastroenterology;;   ESOPHAGOGASTRODUODENOSCOPY (EGD) WITH PROPOFOL N/A 10/29/2021   Procedure: ESOPHAGOGASTRODUODENOSCOPY (EGD) WITH PROPOFOL;  Surgeon: Thornton Park, MD;  Location: [place];  Service: Gastroenterology;  Laterality: N/A;    SocHx  reports that he has never smoked. He has never used smokeless tobacco. He reports that he does not currently use alcohol. He reports that he does not use  drugs.  No Known Allergies  FamHx Family History  Problem Relation Age of Onset   Stroke Mother    Lung disease Father    Diabetes Sister    Heart disease Sister    Hyperlipidemia Son    Hyperlipidemia Daughter    Hypertension Daughter     Prior to Admission medications   Medication Sig Start Date End Date Taking? Authorizing Provider  Accu-Chek Softclix Lancets lancets USE UPTO FOUR TIMES DAILY AS DIRECTED 01/08/20   [provider]  acetaminophen (TYLENOL) 325 MG tablet Take 1-2 tablets (325-650 mg total) by mouth every 4 (four) hours as needed for mild pain. 01/07/20   Love, Ivan Anchors, PA-C  allopurinol (ZYLOPRIM) 300 MG tablet Take 1 tablet (300 mg total) by mouth daily. 11/23/21   Rice, Resa Miner, MD  atorvastatin (LIPITOR) 40 MG tablet Take 1 tablet (40 mg total) by mouth daily at 6 PM. 01/07/20   Love, Ivan Anchors, PA-C  blood glucose meter kit and supplies KIT Dispense based on patient and insurance preference. Use up to four times daily as directed. (FOR ICD-9 250.00, 250.01). 01/07/20   Love, Ivan Anchors, PA-C  colchicine 0.6 MG tablet Take 1 tablet (0.6 mg total) by mouth daily. 11/11/21   Regalado, Belkys A, MD  diclofenac Sodium (VOLTAREN) 1 % GEL Apply 2 g topically 4 (four) times daily. 11/10/21   Regalado, Belkys A, MD  magnesium oxide (MAG-OX) 400 (240 Mg) MG tablet Take 0.5 tablets (200 mg total) by mouth 2 (two)  times daily. Patient not taking: Reported on 11/29/2021 11/10/21   Regalado, Jerald Kief A, MD  Menthol-Methyl Salicylate (MUSCLE RUB) 10-15 % CREA Apply 1 application topically 2 (two) times daily as needed for muscle pain. To lower back 01/07/20   Love, Ivan Anchors, PA-C  metFORMIN (GLUCOPHAGE) 1000 MG tablet Take 1,000 mg by mouth 2 (two) times daily. 02/29/20   [provider]  Multiple Vitamin (MULTIVITAMIN WITH MINERALS) TABS tablet Take 1 tablet by mouth daily. 01/07/20   Love, Ivan Anchors, PA-C  ondansetron (ZOFRAN) 8 MG tablet Take 1 tablet (8 mg total) by  mouth 2 (two) times daily as needed (Nausea or vomiting). Patient not taking: Reported on 11/29/2021 11/27/21   Truitt Merle, MD  pantoprazole (PROTONIX) 40 MG tablet Take 1 tablet (40 mg total) by mouth 2 (two) times daily. 11/10/21 11/10/22  Elmarie Shiley, MD    Physical Exam: Vitals:   12/05/21 1145 12/05/21 1200 12/05/21 1215 12/05/21 1230  BP: 106/67 98/68 109/68 106/69  Pulse: 99 96 (!) 101 99  Resp: 15 19 16 14   Temp:      TempSrc:      SpO2: 98% 99% 98% 98%  Weight:      Height:        General: 80 y.o. male resting in bed in NAD Eyes: PERRL, normal sclera ENMT: Nares patent w/o discharge, orophaynx clear, dentition normal, ears w/o discharge/lesions/ulcers Neck: Supple, trachea midline Cardiovascular: RRR, +S1, S2, no m/g/r, equal pulses throughout Respiratory: CTABL, no w/r/r, normal WOB GI: BS+, ND, TTP in epigastric area, no masses noted, no organomegaly noted MSK: No e/c/c Neuro: A&O x 1 (name only), no focal deficits Psyc: Appropriate interaction and affect, calm/cooperative  Labs on Admission: I have personally reviewed following labs and imaging studies  CBC: Recent Labs  Lab 11/29/21 1432 12/05/21 0745 12/05/21 0942  WBC 3.1* 4.0 3.9*  NEUTROABS 0.6* 0.8* 1.1*  HGB 8.1* 7.0* 7.3*  HCT 23.5* 20.7* 21.5*  MCV 91.4 89.6 92.3  PLT 96* 86* 77*   Basic Metabolic Panel: Recent Labs  Lab 11/29/21 1432 12/05/21 0745  NA 137 136  K 3.8 3.9  CL 105 99  CO2 22 26  GLUCOSE 146* 208*  BUN 19 14  CREATININE 1.15 1.18  CALCIUM 8.4* 9.2   GFR: Estimated Creatinine Clearance: 38.6 mL/min (by C-G formula based on SCr of 1.18 mg/dL). Liver Function Tests: Recent Labs  Lab 11/29/21 1432 12/05/21 0745  AST 24 16  ALT 16 12  ALKPHOS 90 110  BILITOT 0.6 1.0  PROT 5.9* 6.4*  ALBUMIN 2.8* 3.6   Recent Labs  Lab 11/29/21 1432  LIPASE 50   No results for input(s): AMMONIA in the last 168 hours. Coagulation Profile: Recent Labs  Lab 11/29/21 1432   INR 1.1   Cardiac Enzymes: No results for input(s): CKTOTAL, CKMB, CKMBINDEX, TROPONINI in the last 168 hours. BNP (last 3 results) No results for input(s): PROBNP in the last 8760 hours. HbA1C: No results for input(s): HGBA1C in the last 72 hours. CBG: No results for input(s): GLUCAP in the last 168 hours. Lipid Profile: No results for input(s): CHOL, HDL, LDLCALC, TRIG, CHOLHDL, LDLDIRECT in the last 72 hours. Thyroid Function Tests: No results for input(s): TSH, T4TOTAL, FREET4, T3FREE, THYROIDAB in the last 72 hours. Anemia Panel: No results for input(s): VITAMINB12, FOLATE, FERRITIN, TIBC, IRON, RETICCTPCT in the last 72 hours. Urine analysis:    Component Value Date/Time   COLORURINE YELLOW 11/29/2021 1521   APPEARANCEUR  HAZY (A) 11/29/2021 1521   LABSPEC 1.018 11/29/2021 1521   PHURINE 5.0 11/29/2021 1521   GLUCOSEU NEGATIVE 11/29/2021 1521   HGBUR NEGATIVE 11/29/2021 1521   BILIRUBINUR NEGATIVE 11/29/2021 1521   KETONESUR NEGATIVE 11/29/2021 1521   PROTEINUR NEGATIVE 11/29/2021 1521   NITRITE NEGATIVE 11/29/2021 1521   LEUKOCYTESUR NEGATIVE 11/29/2021 1521    Radiological Exams on Admission: CT Angio Chest PE W and/or Wo Contrast  Result Date: 12/05/2021 CLINICAL DATA:  Pulmonary embolism (PE) suspected, positive D-dimer EXAM: CT ANGIOGRAPHY CHEST WITH CONTRAST TECHNIQUE: Multidetector CT imaging of the chest was performed using the standard protocol during bolus administration of intravenous contrast. Multiplanar CT image reconstructions and MIPs were obtained to evaluate the vascular anatomy. CONTRAST:  19m OMNIPAQUE IOHEXOL 350 MG/ML SOLN COMPARISON:  11/04/2021 FINDINGS: Cardiovascular: Satisfactory opacification of the pulmonary arteries to the segmental level. No evidence of pulmonary embolism. Normal heart size. No pericardial effusion. Coronary artery calcification. Thoracic aorta is normal in caliber with calcified plaque. Mediastinum/Nodes: No enlarged nodes.  Subcentimeter calcified right thyroid nodule for which no ultrasound follow-up is recommended. Unremarkable esophagus. Lungs/Pleura: No pleural effusion or pneumothorax. Mild bibasilar atelectasis. Stable 6 mm nodule of the lingula (see previous follow-up recommendations). Upper Abdomen: No acute abnormality. Musculoskeletal: No acute abnormality. Review of the MIP images confirms the above findings. IMPRESSION: No acute pulmonary embolism or other acute abnormality. Resolution of small pleural effusions. Mild bibasilar atelectasis. Electronically Signed   By: PMacy MisM.D.   On: 12/05/2021 11:39   DG Chest Port 1 View  Result Date: 12/05/2021 CLINICAL DATA:  Syncope EXAM: PORTABLE CHEST 1 VIEW COMPARISON:  Chest x-ray 11/29/2021 FINDINGS: Cardiomegaly and mediastinum appear unchanged. Calcified plaques in the aortic arch. Pulmonary vasculature is normal. No focal consolidation identified. No pleural effusion or pneumothorax. IMPRESSION: Cardiomegaly with no acute process identified. Electronically Signed   By: DOfilia NeasM.D.   On: 12/05/2021 10:33    EKG: Independently reviewed. 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 FBurr Medico   Status is: Observation  The patient remains OBS  appropriate and will d/c before 2 midnights.  Time spent coordinating admission: 70 minutes  [facility]  If 7PM-7AM, please contact night-coverage [email or web address]  12/05/2021, 12:45 PM

## 2021-12-05 NOTE — ED Notes (Signed)
Pt in bed, computer interpreter used, pt denies pain, sob or itchiness.

## 2021-12-05 NOTE — ED Triage Notes (Signed)
Pt to er room B via RR team, per RR team pt was at the cancer center and stood up and passed out, states that they were uncertain if he had a pulse, states that no compressions were started and pt woke up.  Pt presently in bed, awake, pt oriented to self, granddaughter at bedside states that he is mostly spanish speaking, states that he is currently confused compared to this am.  Pt oriented to person, place, doesn't know day of the week, re oriented pt, pt states that he passed out, pt c/o chest pain.

## 2021-12-05 NOTE — Progress Notes (Signed)
..  Spoilage occurred on 12/05/2021,Medication dispensed: Azacitidine, per Mariann Laster advised pharmacy of waste/spoilage of medication due to patient elevated HR and condition required rapid response to be call and taken to ED for evaluation. [employer] was advised of (spoilage/waste/defective) waste and has advised that there is no replacement program for medication at this time.  Case #:[id number] Lot:  [id number] Exp: 01/09/2024  Finance team notified to make sure not charges are rendered for DOS: 12/05/2021 for medication was not administered to patient.   Marland KitchenJuan Quam, CPhT IV Drug Replacement Specialist [place] Phone: [phone]

## 2021-12-05 NOTE — ED Notes (Signed)
Pt in bed, family at bedside, pt denies pain, pt reports some nausea, computer interpreter used, reviewed transfusion consent, pt verbalized understanding risks and benefits, states that he has had a transfusion in the past.  Consent signed

## 2021-12-05 NOTE — ED Notes (Addendum)
Pt in bed, pt denies sob, chest pain, itchiness, no rash noted.

## 2021-12-05 NOTE — Progress Notes (Signed)
Dr Burr Medico gave order to walk patient and check O2 sat. Upon standing, patient reported that he did not feel well. RN seated patient back in his chair and he began to complain of chest pain and color turned grey. Patient became unresponsive with no breathing and no pulse felt. Patient was placed in the floor by 2 Rns and upon beginning chest compressions, patient began to awaken with spontaneous breathing and return of pulse. BP 102 54  HR 84  Rapid response was called and Dr Burr Medico came to re-eval patient. Per Dr Burr Medico, hold treatment today and send patient to ED.

## 2021-12-05 NOTE — ED Notes (Addendum)
Increased blood gtt to 15ml/hr, per md Kyle infuse unit over 2hrs

## 2021-12-06 ENCOUNTER — Observation Stay (HOSPITAL_BASED_OUTPATIENT_CLINIC_OR_DEPARTMENT_OTHER): Payer: Medicare Other

## 2021-12-06 ENCOUNTER — Inpatient Hospital Stay: Payer: Medicare Other

## 2021-12-06 DIAGNOSIS — R55 Syncope and collapse: Secondary | ICD-10-CM | POA: Diagnosis not present

## 2021-12-06 DIAGNOSIS — Z20822 Contact with and (suspected) exposure to covid-19: Secondary | ICD-10-CM | POA: Diagnosis not present

## 2021-12-06 DIAGNOSIS — N183 Chronic kidney disease, stage 3 unspecified: Secondary | ICD-10-CM | POA: Diagnosis not present

## 2021-12-06 DIAGNOSIS — I129 Hypertensive chronic kidney disease with stage 1 through stage 4 chronic kidney disease, or unspecified chronic kidney disease: Secondary | ICD-10-CM | POA: Diagnosis not present

## 2021-12-06 LAB — BPAM RBC
Blood Product Expiration Date: 202301242359
Blood Product Expiration Date: 202301242359
ISSUE DATE / TIME: 202212281451
ISSUE DATE / TIME: 202212281728
Unit Type and Rh: 5100
Unit Type and Rh: 5100

## 2021-12-06 LAB — CBC
HCT: 25.7 % — ABNORMAL LOW (ref 39.0–52.0)
Hemoglobin: 9.1 g/dL — ABNORMAL LOW (ref 13.0–17.0)
MCH: 30.6 pg (ref 26.0–34.0)
MCHC: 35.4 g/dL (ref 30.0–36.0)
MCV: 86.5 fL (ref 80.0–100.0)
Platelets: 67 10*3/uL — ABNORMAL LOW (ref 150–400)
RBC: 2.97 MIL/uL — ABNORMAL LOW (ref 4.22–5.81)
RDW: 14.7 % (ref 11.5–15.5)
WBC: 4.7 10*3/uL (ref 4.0–10.5)
nRBC: 0.4 % — ABNORMAL HIGH (ref 0.0–0.2)

## 2021-12-06 LAB — TYPE AND SCREEN
ABO/RH(D): O POS
Antibody Screen: NEGATIVE
Unit division: 0
Unit division: 0

## 2021-12-06 LAB — ECHOCARDIOGRAM COMPLETE
AR max vel: 1.98 cm2
AV Area VTI: 1.76 cm2
AV Area mean vel: 1.93 cm2
AV Mean grad: 6 mmHg
AV Peak grad: 11.2 mmHg
Ao pk vel: 1.67 m/s
Calc EF: 35.7 %
Height: 62 in
S' Lateral: 3.7 cm
Single Plane A2C EF: 38.9 %
Single Plane A4C EF: 35 %
Weight: 2137.58 oz

## 2021-12-06 LAB — COMPREHENSIVE METABOLIC PANEL
ALT: 14 U/L (ref 0–44)
AST: 17 U/L (ref 15–41)
Albumin: 3 g/dL — ABNORMAL LOW (ref 3.5–5.0)
Alkaline Phosphatase: 88 U/L (ref 38–126)
Anion gap: 9 (ref 5–15)
BUN: 13 mg/dL (ref 8–23)
CO2: 25 mmol/L (ref 22–32)
Calcium: 8.7 mg/dL — ABNORMAL LOW (ref 8.9–10.3)
Chloride: 103 mmol/L (ref 98–111)
Creatinine, Ser: 0.86 mg/dL (ref 0.61–1.24)
GFR, Estimated: >60 mL/min (ref >60)
Glucose, Bld: 125 mg/dL — ABNORMAL HIGH (ref 70–99)
Potassium: 4.1 mmol/L (ref 3.5–5.1)
Sodium: 137 mmol/L (ref 135–145)
Total Bilirubin: 1 mg/dL (ref 0.3–1.2)
Total Protein: 5.6 g/dL — ABNORMAL LOW (ref 6.5–8.1)

## 2021-12-06 LAB — GLUCOSE, CAPILLARY
Glucose-Capillary: 114 mg/dL — ABNORMAL HIGH (ref 70–99)
Glucose-Capillary: 123 mg/dL — ABNORMAL HIGH (ref 70–99)
Glucose-Capillary: 155 mg/dL — ABNORMAL HIGH (ref 70–99)

## 2021-12-06 LAB — HEMOGLOBIN A1C
Hgb A1c MFr Bld: 6.3 % — ABNORMAL HIGH (ref 4.8–5.6)
Mean Plasma Glucose: 134.11 mg/dL

## 2021-12-06 NOTE — Progress Notes (Signed)
Complained of non-radiating chest discomfort x 5 minutes, at 0330   Ekg unchanged(LBBB) vss denies shortness of breath. On call aware  Resting quietly at present

## 2021-12-06 NOTE — TOC Progression Note (Signed)
Transition of Care [facility]) - Progression Note    Patient Details  Name: Eugene Cooper MRN: [id number] Date of Birth: Apr 16, 1941  [facility] CM/SW Contact  Leeroy Cha, RN Phone Number: 12/06/2021, 2:47 PM  Clinical Narrative:    Pt dcd to return to home.  [facility] will provide the pt service.   Expected Discharge Plan: Home/Self Care Barriers to Discharge: Barriers Resolved  Expected Discharge Plan and Services Expected Discharge Plan: Home/Self Care   Discharge Planning Services: CM Consult   Living arrangements for the past 2 months: Single Family Home Expected Discharge Date: 12/06/21                                     Social Determinants of Health (SDOH) Interventions    Readmission Risk Interventions No flowsheet data found.

## 2021-12-06 NOTE — Evaluation (Signed)
Physical Therapy Evaluation Patient Details Name: Eugene Cooper MRN: [id number] DOB: 02-Feb-1941 Today's Date: 12/06/2021  History of Present Illness  Eugene Cooper is a 80 y.o. male with medical history significant of HTN, MDS, HLD, DM2. Presenting w/ syncopal episode. History is from patient and daughter. He was at the cancer center this morning. He was about to get chemo, when he began to feel nauseous and weak. The staff tried to walk him, but his heart rate rose. He passed out.  Clinical Impression  Patient ambulated 60' BP 116/42, no dizziness. Daughter present. Interpreter utilzed(aaron)  . Patient plans Dc today.  Dtr reports patient has HHPT on board.  Patient will DC.      Recommendations for follow up therapy are one component of a multi-disciplinary discharge planning process, led by the attending physician.  Recommendations may be updated based on patient status, additional functional criteria and insurance authorization.  Follow Up Recommendations Home health PT    Assistance Recommended at Discharge Intermittent Supervision/Assistance  Functional Status Assessment Patient has had a recent decline in their functional status and demonstrates the ability to make significant improvements in function in a reasonable and predictable amount of time.  Equipment Recommendations  None recommended by PT    Recommendations for Other Services       Precautions / Restrictions Precautions Precautions: Fall Precaution Comments: syncopal episode at Cancer ctr      Mobility  Bed Mobility Overal bed mobility: Needs Assistance Bed Mobility: Rolling;Sidelying to Sit;Sit to Supine Rolling: Supervision Sidelying to sit: Min assist   Sit to supine: Supervision   General bed mobility comments: assist with trunk, extra time    Transfers Overall transfer level: Needs assistance Equipment used: Rolling walker (2 wheels) Transfers: Sit to/from Stand Sit to Stand: Min guard                 Ambulation/Gait Ambulation/Gait assistance: Min guard;Min assist Gait Distance (Feet): 60 Feet Assistive device: Rolling walker (2 wheels) Gait Pattern/deviations: Step-through pattern       General Gait Details: gait steady with close guard  Stairs            Wheelchair Mobility    Modified Rankin (Stroke Patients Only)       Balance Overall balance assessment: Mild deficits observed, not formally tested                                           Pertinent Vitals/Pain Pain Assessment: Faces Faces Pain Scale: Hurts little more Pain Location: left lower side when coughs Pain Descriptors / Indicators: Discomfort Pain Intervention(s): Monitored during session (showed pt. to place a pillow at side to cough)    Home Living Family/patient expects to be discharged to:: Private residence Living Arrangements: Children Available Help at Discharge: Family;Available 24 hours/day Type of Home: House Home Access: Stairs to enter Entrance Stairs-Rails: Right Entrance Stairs-Number of Steps: 1   Home Layout: One level Home Equipment: Conservation officer, nature (2 wheels);Cane - single point;Shower seat;Wheelchair - manual;Hand held shower head Additional Comments: Alternates living with son and daughter for the past 10 years. [place] outside of [place] 2[place]3x [place]. Home set-up above is for sons residence.    Prior Function Prior Level of Function : Needs assist       Physical Assist : Mobility (physical);ADLs (physical) Mobility (physical): Stairs  Hand Dominance   Dominant Hand: Right    Extremity/Trunk Assessment   Upper Extremity Assessment Upper Extremity Assessment: Overall WFL for tasks assessed    Lower Extremity Assessment Lower Extremity Assessment: Generalized weakness    Cervical / Trunk Assessment Cervical / Trunk Assessment: Normal  Communication   Communication: Prefers language other than  [place];Interpreter utilized  Cognition Arousal/Alertness: Awake/alert Behavior During Therapy: WFL for tasks assessed/performed Overall Cognitive Status: Within Functional Limits for tasks assessed                                          General Comments      Exercises     Assessment/Plan    PT Assessment All further PT needs can be met in the next venue of care (has HHPT per dtr)  PT Problem List Decreased mobility;Decreased activity tolerance;Decreased balance       PT Treatment Interventions      PT Goals (Current goals can be found in the Care Plan section)  Acute Rehab PT Goals Patient Stated Goal: go home PT Goal Formulation: All assessment and education complete, DC therapy    Frequency     Barriers to discharge        Co-evaluation               AM-PAC PT "6 Clicks" Mobility  Outcome Measure Help needed turning from your back to your side while in a flat bed without using bedrails?: A Little Help needed moving from lying on your back to sitting on the side of a flat bed without using bedrails?: A Little Help needed moving to and from a bed to a chair (including a wheelchair)?: A Little Help needed standing up from a chair using your arms (e.g., wheelchair or bedside chair)?: A Little Help needed to walk in hospital room?: A Little Help needed climbing 3-5 steps with a railing? : A Little 6 Click Score: 18    End of Session Equipment Utilized During Treatment: Gait belt Activity Tolerance: Patient tolerated treatment well Patient left: in bed;with call bell/phone within reach;with family/visitor present Nurse Communication: Mobility status PT Visit Diagnosis: Unsteadiness on feet (R26.81)    Time: 1638-4536 PT Time Calculation (min) (ACUTE ONLY): 39 min   Charges:   PT Evaluation $PT Eval Low Complexity: 1 Low PT Treatments $Gait Training: 8-22 mins $Self Care/Home Management: [place] Pager [phone] Office [phone]   Eugene Cooper 12/06/2021, 2:33 PM

## 2021-12-06 NOTE — Discharge Summary (Signed)
Physician Discharge Summary  °Eugene Cooper MRN:[id number] DOB: 11/27/1941 DOA: 12/05/2021 ° °PCP: Terry, Tyler, NP ° °Admit date: 12/05/2021 °Discharge date: 12/06/2021 ° °Admitted From: Home °Disposition:  Home ° °Discharge Condition:Stable °CODE STATUS:FULL °Diet recommendation: Carb Modified ° °Brief/Interim Summary: ° °HPI: Eugene Cooper is a 80 y.o. male with medical history significant of HTN, MDS, HLD, DM2. Presenting w/ syncopal episode. History is from patient and daughter. He was at the cancer center this morning. He was about to get chemo, when he began to feel nauseous and weak. The staff tried to walk him, but his heart rate rose. He passed out. The daughter is unaware of how long he was down. He did not have any tonic-clonic movements. Neither did he have incontinence. They became concerned and sent him to the ED for evaluation. °  °ED Course: He was found to be hypotensive. He was tachycardic. He had a drop in his Hgb. 2 units of pRBCs were ordered. His trp were negative. EKG showed sinus tach w/ a known LBBB. TRH was called for admission ° °Hospital course: ° °Hospital course remained stable.  This morning he was hemodynamically stable.  Blood pressure was stable.  He did not complain of any lightheadedness or dizziness.  He was alert oriented.  Family at the bedside.  Hemoglobin in the range of 9.  Orthostatic vitals were negative °Patient was evaluated by physical therapy, no follow-up recommended. °Patient is medically stable for discharge to home today.  Echocardiogram not done because it was just done about a month ago which showed normal left ventricular function, no valvular abnormalities. 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 °    - 2 units pRBCs ordered in ED °    - Hb stable in the range of 9 today °    - F/U with oncology as an outpatient °  °HLD °     - continue statin ° ° ° °Discharge Diagnoses:  °Principal Problem: °  Syncope ° ° ° °Discharge Instructions ° °Discharge Instructions   ° ° Diet Carb Modified   Complete by: As directed °  ° Discharge instructions   Complete by: As directed °  ° 1)Please continue your home medications °2)Follow up with your PCP and oncologist as an outpatient  ° Increase activity slowly   Complete by: As directed °  ° °  ° °Allergies as of 12/06/2021   °No Known Allergies °  ° °  °Medication List  °  ° °STOP taking these medications   ° °colchicine 0.6 MG tablet °  °multivitamin with minerals Tabs tablet °  ° °  ° °TAKE these medications   ° °Accu-Chek Softclix Lancets lancets °USE UPTO FOUR TIMES DAILY AS DIRECTED °  °acetaminophen 325 MG tablet °Commonly known as: TYLENOL °Take 1-2 tablets (325-650 mg total) by mouth every 4 (four) hours as needed for mild pain. °  °allopurinol 300   MG tablet Commonly known as: ZYLOPRIM Take 1 tablet (300 mg total) by mouth daily.   atorvastatin 40 MG tablet Commonly known as: LIPITOR Take 1 tablet (40 mg total) by mouth daily at 6 PM.   blood glucose meter kit and supplies Kit Dispense based on patient and insurance preference. Use up to four times daily as directed. (FOR ICD-9 250.00, 250.01).   diclofenac Sodium 1 % Gel Commonly known as: VOLTAREN Apply 2 g topically 4 (four) times daily. What changed:  when to take this reasons to take this   HYDROcodone-acetaminophen 5-325 MG tablet Commonly known as: NORCO/VICODIN Take 0.5-1 tablets by mouth 2 (two) times daily as needed for pain.   magnesium oxide 400 (240 Mg) MG tablet Commonly  known as: MAG-OX Take 0.5 tablets (200 mg total) by mouth 2 (two) times daily.   metFORMIN 1000 MG tablet Commonly known as: GLUCOPHAGE Take 1,000 mg by mouth 2 (two) times daily.   ondansetron 8 MG tablet Commonly known as: Zofran Take 1 tablet (8 mg total) by mouth 2 (two) times daily as needed (Nausea or vomiting).   pantoprazole 40 MG tablet Commonly known as: Protonix Take 1 tablet (40 mg total) by mouth 2 (two) times daily.        Follow-up Information     Emelia Loron, NP. Schedule an appointment as soon as possible for a visit in 1 week(s).   Specialty: Nurse Practitioner Contact information: [place] [address] [phone]                No Known Allergies  Consultations: None   Procedures/Studies: CT Angio Chest PE W and/or Wo Contrast  Result Date: 12/05/2021 CLINICAL DATA:  Pulmonary embolism (PE) suspected, positive D-dimer EXAM: CT ANGIOGRAPHY CHEST WITH CONTRAST TECHNIQUE: Multidetector CT imaging of the chest was performed using the standard protocol during bolus administration of intravenous contrast. Multiplanar CT image reconstructions and MIPs were obtained to evaluate the vascular anatomy. CONTRAST:  43m OMNIPAQUE IOHEXOL 350 MG/ML SOLN COMPARISON:  11/04/2021 FINDINGS: Cardiovascular: Satisfactory opacification of the pulmonary arteries to the segmental level. No evidence of pulmonary embolism. Normal heart size. No pericardial effusion. Coronary artery calcification. Thoracic aorta is normal in caliber with calcified plaque. Mediastinum/Nodes: No enlarged nodes. Subcentimeter calcified right thyroid nodule for which no ultrasound follow-up is recommended. Unremarkable esophagus. Lungs/Pleura: No pleural effusion or pneumothorax. Mild bibasilar atelectasis. Stable 6 mm nodule of the lingula (see previous follow-up recommendations). Upper Abdomen: No acute abnormality. Musculoskeletal: No acute abnormality. Review of the  MIP images confirms the above findings. IMPRESSION: No acute pulmonary embolism or other acute abnormality. Resolution of small pleural effusions. Mild bibasilar atelectasis. Electronically Signed   By: PMacy MisM.D.   On: 12/05/2021 11:39   DG Chest Port 1 View  Result Date: 12/05/2021 CLINICAL DATA:  Syncope EXAM: PORTABLE CHEST 1 VIEW COMPARISON:  Chest x-ray 11/29/2021 FINDINGS: Cardiomegaly and mediastinum appear unchanged. Calcified plaques in the aortic arch. Pulmonary vasculature is normal. No focal consolidation identified. No pleural effusion or pneumothorax. IMPRESSION: Cardiomegaly with no acute process identified. Electronically Signed   By: DOfilia NeasM.D.   On: 12/05/2021 10:33   DG Chest Port 1 View  Result Date: 11/29/2021 CLINICAL DATA:  Chest pain EXAM: PORTABLE CHEST 1 VIEW COMPARISON:  Portable exam 1449 hours compared to 10/30/2021 FINDINGS: Mild enlargement of cardiac silhouette. Mediastinal contours and pulmonary vascularity normal. Atherosclerotic calcification aorta. Lungs clear. No pulmonary infiltrate, pleural effusion, or pneumothorax. Osseous structures unremarkable.  IMPRESSION: Mild enlargement of cardiac silhouette. No acute abnormalities. Aortic Atherosclerosis (ICD10-I70.0). Electronically Signed   By: Lavonia Dana M.D.   On: 11/29/2021 15:09   CT BONE MARROW BIOPSY & ASPIRATION  Result Date: 11/08/2021 INDICATION: Fever of unknown origin EXAM: CT BONE MARROW BIOPSY AND ASPIRATION MEDICATIONS: None. ANESTHESIA/SEDATION: Moderate (conscious) sedation was employed during this procedure. A total of Versed 1.5 mg and Fentanyl 75 mcg was administered intravenously. Moderate Sedation Time: 11 minutes. The patient's level of consciousness and vital signs were monitored continuously by radiology nursing throughout the procedure under my direct supervision. FLUOROSCOPY TIME:  N/a COMPLICATIONS: None immediate. PROCEDURE: Informed written consent was obtained from  the patient after a thorough discussion of the procedural risks, benefits and alternatives. All questions were addressed. Maximal Sterile Barrier Technique was utilized including caps, mask, sterile gowns, sterile gloves, sterile drape, hand hygiene and skin antiseptic. A timeout was performed prior to the initiation of the procedure. The patient was placed prone on the CT exam table. Limited CT of the pelvis was performed for planning purposes. Skin entry site was marked, and the overlying skin was prepped and draped in the standard sterile fashion. Local analgesia was obtained with 1% lidocaine. Using CT guidance, an 11 gauge needle was advanced just deep to the cortex of the right posterior ilium. Subsequently, bone marrow aspiration and core biopsy were performed. Specimens were submitted to lab/pathology for handling. Hemostasis was achieved with manual pressure, and a clean dressing was placed. The patient tolerated the procedure well without immediate complication. IMPRESSION: Successful CT-guided bone marrow aspiration and core biopsy of the right posterior ilium. Electronically Signed   By: Albin Felling M.D.   On: 11/08/2021 10:33      Subjective:  Patient seen and examined the bedside this morning.  Hemodynamically stable for discharge today. Discharge Exam: Vitals:   12/06/21 0835 12/06/21 1341  BP: 131/81 124/73  Pulse: (!) 105 96  Resp: 18 18  Temp: 98.3 F (36.8 C) 99.4 F (37.4 C)  SpO2:  98%   Vitals:   12/06/21 0353 12/06/21 0500 12/06/21 0835 12/06/21 1341  BP: 131/79  131/81 124/73  Pulse: (!) 101  (!) 105 96  Resp: _0 Temp: 98.1 F (36.7 C)  98.3 F (36.8 C) 99.4 F (37.4 C)  TempSrc: Oral  Oral Oral  SpO2: 96%   98%  Weight:  60.6 kg    Height:        General: Pt is alert, awake, not in acute distress Cardiovascular: RRR, S1/S2 +, no rubs, no gallops Respiratory: CTA bilaterally, no wheezing, no rhonchi Abdominal: Soft, NT, ND, bowel sounds  + Extremities: no edema, no cyanosis    The results of significant diagnostics from this hospitalization (including imaging, microbiology, ancillary and laboratory) are listed below for reference.     Microbiology: Recent Results (from the past 240 hour(s))  Resp Panel by RT-PCR (Flu A&B, Covid) Nasopharyngeal Swab     Status: None   Collection Time: 11/29/21  4:09 PM   Specimen: Nasopharyngeal Swab; Nasopharyngeal(NP) swabs in vial transport medium  Result Value Ref Range Status   SARS Coronavirus 2 by RT PCR NEGATIVE NEGATIVE Final    Comment: (NOTE) SARS-CoV-2 target nucleic acids are NOT DETECTED.  The SARS-CoV-2 RNA is generally detectable in upper respiratory specimens during the acute phase of infection. The lowest concentration of SARS-CoV-2 viral copies this assay can detect is 138 copies/mL. A negative result does not preclude SARS-Cov-2 infection and  should not be used as the sole basis for treatment or °other patient management decisions. A negative result may occur with  °improper specimen collection/handling, submission of specimen other °than nasopharyngeal swab, presence of viral mutation(s) within the °areas targeted by this assay, and inadequate number of viral °copies(<138 copies/mL). A negative result must be combined with °clinical observations, patient history, and epidemiological °information. The expected result is Negative. ° °Fact Sheet for Patients:  °https://www.fda.gov/media/152166/download ° °Fact Sheet for Healthcare Providers:  °https://www.fda.gov/media/152162/download ° °This test is no t yet approved or cleared by the [place] FDA and  °has been authorized for detection and/or diagnosis of SARS-CoV-2 by °FDA under an Emergency Use Authorization (EUA). This EUA will remain  °in effect (meaning this test can be used) for the duration of the °COVID-19 declaration under Section 564(b)(1) of the Act, 21 °U.S.C.section 360bbb-3(b)(1), unless the authorization  is terminated  °or revoked sooner.  ° ° °  ° Influenza A by PCR NEGATIVE NEGATIVE Final  ° Influenza B by PCR NEGATIVE NEGATIVE Final  °  Comment: (NOTE) °The Xpert Xpress SARS-CoV-2/FLU/RSV plus assay is intended as an aid °in the diagnosis of influenza from Nasopharyngeal swab specimens and °should not be used as a sole basis for treatment. Nasal washings and °aspirates are unacceptable for Xpert Xpress SARS-CoV-2/FLU/RSV °testing. ° °Fact Sheet for Patients: °https://www.fda.gov/media/152166/download ° °Fact Sheet for Healthcare Providers: °https://www.fda.gov/media/152162/download ° °This test is not yet approved or cleared by the [place] FDA and °has been authorized for detection and/or diagnosis of SARS-CoV-2 by °FDA under an Emergency Use Authorization (EUA). This EUA will remain °in effect (meaning this test can be used) for the duration of the °COVID-19 declaration under Section 564(b)(1) of the Act, 21 U.S.C. °section 360bbb-3(b)(1), unless the authorization is terminated or °revoked. ° °Performed at [facility], [address] [place], [place] °[address] °  °Resp Panel by RT-PCR (Flu A&B, Covid) Nasopharyngeal Swab     Status: None  ° Collection Time: 12/05/21  9:42 AM  ° Specimen: Nasopharyngeal Swab; Nasopharyngeal(NP) swabs in vial transport medium  °Result Value Ref Range Status  ° SARS Coronavirus 2 by RT PCR NEGATIVE NEGATIVE Final  °  Comment: (NOTE) °SARS-CoV-2 target nucleic acids are NOT DETECTED. ° °The SARS-CoV-2 RNA is generally detectable in upper respiratory °specimens during the acute phase of infection. The lowest °concentration of SARS-CoV-2 viral copies this assay can detect is °138 copies/mL. A negative result does not preclude SARS-Cov-2 °infection and should not be used as the sole basis for treatment or °other patient management decisions. A negative result may occur with  °improper specimen collection/handling, submission of specimen other °than nasopharyngeal swab,  presence of viral mutation(s) within the °areas targeted by this assay, and inadequate number of viral °copies(<138 copies/mL). A negative result must be combined with °clinical observations, patient history, and epidemiological °information. The expected result is Negative. ° °Fact Sheet for Patients:  °https://www.fda.gov/media/152166/download ° °Fact Sheet for Healthcare Providers:  °https://www.fda.gov/media/152162/download ° °This test is no t yet approved or cleared by the [place] FDA and  °has been authorized for detection and/or diagnosis of SARS-CoV-2 by °FDA under an Emergency Use Authorization (EUA). This EUA will remain  °in effect (meaning this test can be used) for the duration of the °COVID-19 declaration under Section 564(b)(1) of the Act, 21 °U.S.C.section 360bbb-3(b)(1), unless the authorization is terminated  °or revoked sooner.  ° ° °  ° Influenza A by PCR NEGATIVE NEGATIVE Final  ° Influenza B   by PCR NEGATIVE NEGATIVE Final  °  Comment: (NOTE) °The Xpert Xpress SARS-CoV-2/FLU/RSV plus assay is intended as an aid °in the diagnosis of influenza from Nasopharyngeal swab specimens and °should not be used as a sole basis for treatment. Nasal washings and °aspirates are unacceptable for Xpert Xpress SARS-CoV-2/FLU/RSV °testing. ° °Fact Sheet for Patients: °https://www.fda.gov/media/152166/download ° °Fact Sheet for Healthcare Providers: °https://www.fda.gov/media/152162/download ° °This test is not yet approved or cleared by the [place] FDA and °has been authorized for detection and/or diagnosis of SARS-CoV-2 by °FDA under an Emergency Use Authorization (EUA). This EUA will remain °in effect (meaning this test can be used) for the duration of the °COVID-19 declaration under Section 564(b)(1) of the Act, 21 U.S.C. °section 360bbb-3(b)(1), unless the authorization is terminated or °revoked. ° °Performed at [facility], [address] °[place], [place] [address] °  °   ° °Labs: °BNP (last 3 results) °Recent Labs  °  11/01/21 °0213  °BNP 187.8*  ° °Basic Metabolic Panel: °Recent Labs  °Lab 11/29/21 °1432 12/05/21 °0745 12/06/21 °0026  °NA 137 136 137  °K 3.8 3.9 4.1  °CL 105 99 103  °CO2 22 26 25  °GLUCOSE 146* 208* 125*  °BUN 19 14 13  °CREATININE 1.15 1.18 0.86  °CALCIUM 8.4* 9.2 8.7*  ° °Liver Function Tests: °Recent Labs  °Lab 11/29/21 °1432 12/05/21 °0745 12/06/21 °0026  °AST 24 16 17  °ALT 16 12 14  °ALKPHOS 90 110 88  °BILITOT 0.6 1.0 1.0  °PROT 5.9* 6.4* 5.6*  °ALBUMIN 2.8* 3.6 3.0*  ° °Recent Labs  °Lab 11/29/21 °1432  °LIPASE 50  ° °No results for input(s): AMMONIA in the last 168 hours. °CBC: °Recent Labs  °Lab 11/29/21 °1432 12/05/21 °0745 12/05/21 °0942 12/05/21 °2059 12/06/21 °0026  °WBC 3.1* 4.0 3.9*  --  4.7  °NEUTROABS 0.6* 0.8* 1.1*  --   --   °HGB 8.1* 7.0* 7.3* 9.2* 9.1*  °HCT 23.5* 20.7* 21.5* 26.1* 25.7*  °MCV 91.4 89.6 92.3  --  86.5  °PLT 96* 86* 77*  --  67*  ° °Cardiac Enzymes: °No results for input(s): CKTOTAL, CKMB, CKMBINDEX, TROPONINI in the last 168 hours. °BNP: °Invalid input(s): POCBNP °CBG: °Recent Labs  °Lab 12/05/21 °1945 12/05/21 °2206 12/06/21 °0502 12/06/21 °0802 12/06/21 °1151  °GLUCAP 112* 111* 114* 123* 155*  ° °D-Dimer °Recent Labs  °  12/05/21 °0942  °DDIMER 2.22*  ° °Hgb A1c °Recent Labs  °  12/05/21 °2059  °HGBA1C 6.3*  ° °Lipid Profile °No results for input(s): CHOL, HDL, LDLCALC, TRIG, CHOLHDL, LDLDIRECT in the last 72 hours. °Thyroid function studies °No results for input(s): TSH, T4TOTAL, T3FREE, THYROIDAB in the last 72 hours. ° °Invalid input(s): FREET3 °Anemia work up °No results for input(s): VITAMINB12, FOLATE, FERRITIN, TIBC, IRON, RETICCTPCT in the last 72 hours. °Urinalysis °   °Component Value Date/Time  ° COLORURINE YELLOW 11/29/2021 1521  ° APPEARANCEUR HAZY (A) 11/29/2021 1521  ° LABSPEC 1.018 11/29/2021 1521  ° PHURINE 5.0 11/29/2021 1521  ° GLUCOSEU NEGATIVE 11/29/2021 1521  ° HGBUR NEGATIVE 11/29/2021 1521  °  BILIRUBINUR NEGATIVE 11/29/2021 1521  ° KETONESUR NEGATIVE 11/29/2021 1521  ° PROTEINUR NEGATIVE 11/29/2021 1521  ° NITRITE NEGATIVE 11/29/2021 1521  ° LEUKOCYTESUR NEGATIVE 11/29/2021 1521  ° °Sepsis Labs °Invalid input(s): PROCALCITONIN,  WBC,  LACTICIDVEN °Microbiology °Recent Results (from the past 240 hour(s))  °Resp Panel by RT-PCR (Flu A&B, Covid) Nasopharyngeal Swab     Status: None  ° Collection Time: 11/29/21    4:09 PM  ° Specimen: Nasopharyngeal Swab; Nasopharyngeal(NP) swabs in vial transport medium  °Result Value Ref Range Status  ° SARS Coronavirus 2 by RT PCR NEGATIVE NEGATIVE Final  °  Comment: (NOTE) °SARS-CoV-2 target nucleic acids are NOT DETECTED. ° °The SARS-CoV-2 RNA is generally detectable in upper respiratory °specimens during the acute phase of infection. The lowest °concentration of SARS-CoV-2 viral copies this assay can detect is °138 copies/mL. A negative result does not preclude SARS-Cov-2 °infection and should not be used as the sole basis for treatment or °other patient management decisions. A negative result may occur with  °improper specimen collection/handling, submission of specimen other °than nasopharyngeal swab, presence of viral mutation(s) within the °areas targeted by this assay, and inadequate number of viral °copies(<138 copies/mL). A negative result must be combined with °clinical observations, patient history, and epidemiological °information. The expected result is Negative. ° °Fact Sheet for Patients:  °https://www.fda.gov/media/152166/download ° °Fact Sheet for Healthcare Providers:  °https://www.fda.gov/media/152162/download ° °This test is no t yet approved or cleared by the [place] FDA and  °has been authorized for detection and/or diagnosis of SARS-CoV-2 by °FDA under an Emergency Use Authorization (EUA). This EUA will remain  °in effect (meaning this test can be used) for the duration of the °COVID-19 declaration under Section 564(b)(1) of the Act,  21 °U.S.C.section 360bbb-3(b)(1), unless the authorization is terminated  °or revoked sooner.  ° ° °  ° Influenza A by PCR NEGATIVE NEGATIVE Final  ° Influenza B by PCR NEGATIVE NEGATIVE Final  °  Comment: (NOTE) °The Xpert Xpress SARS-CoV-2/FLU/RSV plus assay is intended as an aid °in the diagnosis of influenza from Nasopharyngeal swab specimens and °should not be used as a sole basis for treatment. Nasal washings and °aspirates are unacceptable for Xpert Xpress SARS-CoV-2/FLU/RSV °testing. ° °Fact Sheet for Patients: °https://www.fda.gov/media/152166/download ° °Fact Sheet for Healthcare Providers: °https://www.fda.gov/media/152162/download ° °This test is not yet approved or cleared by the [place] FDA and °has been authorized for detection and/or diagnosis of SARS-CoV-2 by °FDA under an Emergency Use Authorization (EUA). This EUA will remain °in effect (meaning this test can be used) for the duration of the °COVID-19 declaration under Section 564(b)(1) of the Act, 21 U.S.C. °section 360bbb-3(b)(1), unless the authorization is terminated or °revoked. ° °Performed at [facility], [address] [place], [place] °[address] °  °Resp Panel by RT-PCR (Flu A&B, Covid) Nasopharyngeal Swab     Status: None  ° Collection Time: 12/05/21  9:42 AM  ° Specimen: Nasopharyngeal Swab; Nasopharyngeal(NP) swabs in vial transport medium  °Result Value Ref Range Status  ° SARS Coronavirus 2 by RT PCR NEGATIVE NEGATIVE Final  °  Comment: (NOTE) °SARS-CoV-2 target nucleic acids are NOT DETECTED. ° °The SARS-CoV-2 RNA is generally detectable in upper respiratory °specimens during the acute phase of infection. The lowest °concentration of SARS-CoV-2 viral copies this assay can detect is °138 copies/mL. A negative result does not preclude SARS-Cov-2 °infection and should not be used as the sole basis for treatment or °other patient management decisions. A negative result may occur with  °improper specimen  collection/handling, submission of specimen other °than nasopharyngeal swab, presence of viral mutation(s) within the °areas targeted by this assay, and inadequate number of viral °copies(<138 copies/mL). A negative result must be combined with °clinical observations, patient history, and epidemiological °information. The expected result is Negative. ° °Fact Sheet for Patients:  °https://www.fda.gov/media/152166/download ° °Fact Sheet for Healthcare Providers:  °https://www.fda.gov/media/152162/download ° °This test is no t yet   approved or cleared by the [place] FDA and  °has been authorized for detection and/or diagnosis of SARS-CoV-2 by °FDA under an Emergency Use Authorization (EUA). This EUA will remain  °in effect (meaning this test can be used) for the duration of the °COVID-19 declaration under Section 564(b)(1) of the Act, 21 °U.S.C.section 360bbb-3(b)(1), unless the authorization is terminated  °or revoked sooner.  ° ° °  ° Influenza A by PCR NEGATIVE NEGATIVE Final  ° Influenza B by PCR NEGATIVE NEGATIVE Final  °  Comment: (NOTE) °The Xpert Xpress SARS-CoV-2/FLU/RSV plus assay is intended as an aid °in the diagnosis of influenza from Nasopharyngeal swab specimens and °should not be used as a sole basis for treatment. Nasal washings and °aspirates are unacceptable for Xpert Xpress SARS-CoV-2/FLU/RSV °testing. ° °Fact Sheet for Patients: °https://www.fda.gov/media/152166/download ° °Fact Sheet for Healthcare Providers: °https://www.fda.gov/media/152162/download ° °This test is not yet approved or cleared by the [place] FDA and °has been authorized for detection and/or diagnosis of SARS-CoV-2 by °FDA under an Emergency Use Authorization (EUA). This EUA will remain °in effect (meaning this test can be used) for the duration of the °COVID-19 declaration under Section 564(b)(1) of the Act, 21 U.S.C. °section 360bbb-3(b)(1), unless the authorization is terminated or °revoked. ° °Performed at [facility], [address] °[place], [place] [address] °  ° ° °Please note: °You were cared for by a hospitalist during your hospital stay. Once you are discharged, your primary care physician will handle any further medical issues. Please note that NO REFILLS for any discharge medications will be authorized once you are discharged, as it is imperative that you return to your primary care physician (or establish a relationship with a primary care physician if you do not have one) for your post hospital discharge needs so that they can reassess your need for medications and monitor your lab values. ° ° ° °Time coordinating discharge: 40 minutes ° °SIGNED: ° ° ° , MD  °[facility] °12/06/2021, 2:07 PM °Pager [phone] ° °If 7PM-7AM, please contact night-coverage [email or web address] °Password [email or web address]  °

## 2021-12-06 NOTE — TOC Initial Note (Signed)
Transition of Care [facility]) - Initial/Assessment Note    Patient Details  Name: Eugene Cooper MRN: [id number] Date of Birth: 1941-05-11  Transition of Care [facility]) CM/SW Contact:    Leeroy Cha, RN Phone Number: 12/06/2021, 8:06 AM  Clinical Narrative:                  Transition of Care [facility]) Screening Note   Patient Details  Name: Eugene Cooper Date of Birth: 1941-07-06   Transition of Care [facility]) CM/SW Contact:    Leeroy Cha, RN Phone Number: 12/06/2021, 8:06 AM    Transition of Care Department [facility]) has reviewed patient and no TOC needs have been identified at this time. We will continue to monitor patient advancement through interdisciplinary progression rounds. If new patient transition needs arise, please place a TOC consult.  PLAN: to return to home with self care.  Lives alone has a son for support  Expected Discharge Plan: Home/Self Care Barriers to Discharge: Continued Medical Work up   Patient Goals and CMS Choice Patient states their goals for this hospitalization and ongoing recovery are:: to go home CMS Medicare.gov Compare Post Acute Care list provided to:: Patient    Expected Discharge Plan and Services Expected Discharge Plan: Home/Self Care   Discharge Planning Services: CM Consult   Living arrangements for the past 2 months: Single Family Home                                      Prior Living Arrangements/Services Living arrangements for the past 2 months: Single Family Home Lives with:: Self Patient language and need for interpreter reviewed:: Yes (speaks spanish very poor english) Do you feel safe going back to the place where you live?: Yes      Need for Family Participation in Patient Care: No (Comment) Care giver support system in place?: No (comment)   Criminal Activity/Legal Involvement Pertinent to Current Situation/Hospitalization: No - Comment as needed  Activities of Daily Living Home Assistive  Devices/Equipment: Walker (specify type), Dentures (specify type), Wheelchair (upper denture) ADL Screening (condition at time of admission) Patient's cognitive ability adequate to safely complete daily activities?: Yes Is the patient deaf or have difficulty hearing?: No Does the patient have difficulty seeing, even when wearing glasses/contacts?: No Does the patient have difficulty concentrating, remembering, or making decisions?: No Patient able to express need for assistance with ADLs?: Yes Does the patient have difficulty dressing or bathing?: Yes Independently performs ADLs?: No Communication: Independent Dressing (OT): Needs assistance Is this a change from baseline?: Pre-admission baseline Grooming: Independent Feeding: Independent Bathing: Needs assistance Is this a change from baseline?: Pre-admission baseline Toileting: Needs assistance Is this a change from baseline?: Pre-admission baseline In/Out Bed: Needs assistance Is this a change from baseline?: Pre-admission baseline Walks in Home: Needs assistance Is this a change from baseline?: Pre-admission baseline Does the patient have difficulty walking or climbing stairs?: Yes Weakness of Legs: Both Weakness of Arms/Hands: Both  Permission Sought/Granted                  Emotional Assessment Appearance:: Appears stated age Attitude/Demeanor/Rapport: Engaged Affect (typically observed): Apprehensive Orientation: : Oriented to Self, Oriented to Place, Oriented to  Time, Oriented to Situation Alcohol / Substance Use: Not Applicable Psych Involvement: No (comment)  Admission diagnosis:  Dehydration [E86.0] Syncope [R55] Symptomatic anemia [D64.9] Syncope, unspecified syncope type [R55] Patient Active Problem List  Diagnosis Date Noted   Syncope 12/05/2021   Malnutrition of moderate degree 11/09/2021   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[place])    Neutropenic fever ([place])    Pancytopenia (HCC)    Knee pain     Sepsis with acute renal failure and septic shock (HCC)    Symptomatic anemia    Fever of unknown origin    Pneumonia of left lower lobe due to infectious organism    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    Altered mental status    Acute blood loss anemia 10/28/2021   Hypotension 10/28/2021   Atrial fibrillation with RVR ([place]) 10/28/2021   Hyperkalemia 78/67/5449   Metabolic acidosis, increased anion gap (IAG) 20/09/711   Acute metabolic encephalopathy 19/75/8832   AKI (acute kidney injury) ([place]) 10/28/2021   History of stroke 10/28/2021   Pain in left shoulder 06/04/2021   Gout 06/04/2021   Elevated BUN    Benign essential HTN    Controlled type 2 diabetes mellitus with hyperglycemia, without long-term current use of insulin (HCC)    Labile blood glucose    Neuropathic pain    Acute ischemic stroke ([place]) 12/22/2019   Uncontrolled type 2 diabetes mellitus with hyperglycemia, without long-term current use of insulin ([place]) 12/22/2019   Mixed hyperlipidemia 12/22/2019   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   Essential hypertension    Left bundle branch block    Stroke ([place]) 12/20/2019   Right sided weakness 12/19/2019   PCP:  Emelia Loron, NP Pharmacy:   [id number] [address], [place] - [address] [place] [address] Phone: [phone] Fax: [phone]  [id number] [place], [place] [address] [place] [address] Phone: [phone] Fax: [phone]  [id number] - [place], [place] - [address] [place] [address] Phone: [phone] Fax: [phone]     Social Determinants of Health (SDOH) Interventions    Readmission Risk Interventions No flowsheet data found.

## 2021-12-07 ENCOUNTER — Inpatient Hospital Stay: Payer: Medicare Other

## 2021-12-07 ENCOUNTER — Other Ambulatory Visit: Payer: Self-pay

## 2021-12-07 VITALS — BP 121/79 | HR 110 | Temp 99.1°F | Resp 18

## 2021-12-07 DIAGNOSIS — Z5111 Encounter for antineoplastic chemotherapy: Secondary | ICD-10-CM | POA: Diagnosis not present

## 2021-12-07 DIAGNOSIS — D469 Myelodysplastic syndrome, unspecified: Secondary | ICD-10-CM

## 2021-12-07 MED ORDER — AZACITIDINE CHEMO SQ INJECTION
75.0000 mg/m2 | Freq: Once | INTRAMUSCULAR | Status: AC
  Administered 2021-12-07: 09:00:00 117.5 mg via SUBCUTANEOUS
  Filled 2021-12-07: qty 4

## 2021-12-07 MED ORDER — AZACITIDINE CHEMO SQ INJECTION: 75.0000 mg/m2 | Freq: Once | INTRAMUSCULAR | Status: DC

## 2021-12-07 MED ORDER — ONDANSETRON HCL 8 MG PO TABS
8.0000 mg | ORAL_TABLET | Freq: Once | ORAL | Status: AC
  Administered 2021-12-07: 08:00:00 8 mg via ORAL
  Filled 2021-12-07: qty 1

## 2021-12-07 NOTE — Progress Notes (Signed)
Per Dr. Burr Medico, "OK To Treat w/ANC 1.1, plts 67, and HR >100"

## 2021-12-07 NOTE — Patient Instructions (Signed)
[facility]   °Discharge Instructions: °Thank you for choosing [facility] to provide your oncology and hematology care.  ° °If you have a lab appointment with the Cancer Center, please go directly to the Cancer Center and check in at the registration area. °  °Wear comfortable clothing and clothing appropriate for easy access to any Portacath or PICC line.  ° °We strive to give you quality time with your provider. You may need to reschedule your appointment if you arrive late (15 or more minutes).  Arriving late affects you and other patients whose appointments are after yours.  Also, if you miss three or more appointments without notifying the office, you may be dismissed from the clinic at the provider’s discretion.    °  °For prescription refill requests, have your pharmacy contact our office and allow 72 hours for refills to be completed.   ° °Today you received the following chemotherapy and/or immunotherapy agents: Azacitidine (Vidaza)    °  °To help prevent nausea and vomiting after your treatment, we encourage you to take your nausea medication as directed. ° °BELOW ARE SYMPTOMS THAT SHOULD BE REPORTED IMMEDIATELY: °*FEVER GREATER THAN 100.4 F (38 °C) OR HIGHER °*CHILLS OR SWEATING °*NAUSEA AND VOMITING THAT IS NOT CONTROLLED WITH YOUR NAUSEA MEDICATION °*UNUSUAL SHORTNESS OF BREATH °*UNUSUAL BRUISING OR BLEEDING °*URINARY PROBLEMS (pain or burning when urinating, or frequent urination) °*BOWEL PROBLEMS (unusual diarrhea, constipation, pain near the anus) °TENDERNESS IN MOUTH AND THROAT WITH OR WITHOUT PRESENCE OF ULCERS (sore throat, sores in mouth, or a toothache) °UNUSUAL RASH, SWELLING OR PAIN  °UNUSUAL VAGINAL DISCHARGE OR ITCHING  ° °Items with * indicate a potential emergency and should be followed up as soon as possible or go to the Emergency Department if any problems should occur. ° °Please show the CHEMOTHERAPY ALERT CARD or IMMUNOTHERAPY ALERT CARD at  check-in to the Emergency Department and triage nurse. ° °Should you have questions after your visit or need to cancel or reschedule your appointment, please contact [facility]  Dept: [phone]  and follow the prompts.  Office hours are 8:00 a.m. to 4:30 p.m. Monday - Friday. Please note that voicemails left after 4:00 p.m. may not be returned until the following business day.  We are closed weekends and major holidays. You have access to a nurse at all times for urgent questions. Please call the main number to the clinic Dept: [phone] and follow the prompts. ° ° °For any non-urgent questions, you may also contact your provider using MyChart. We now offer e-Visits for anyone 18 and older to request care online for non-urgent symptoms. For details visit [email or web address]. °  °Also download the MyChart app! Go to the app store, search "MyChart", open the app, select [place], and log in with your MyChart username and password. ° °Due to Covid, a mask is required upon entering the hospital/clinic. If you do not have a mask, one will be given to you upon arrival. For doctor visits, patients may have 1 support person aged 18 or older with them. For treatment visits, patients cannot have anyone with them due to current Covid guidelines and our immunocompromised population.  ° °

## 2021-12-07 NOTE — Progress Notes (Signed)
Per Dr. Burr Medico - okay to treat with recent hospital lab values: platelets - 67 and ANC: 1.1

## 2021-12-08 ENCOUNTER — Inpatient Hospital Stay: Payer: Medicare Other

## 2021-12-08 ENCOUNTER — Other Ambulatory Visit: Payer: Self-pay

## 2021-12-08 VITALS — BP 119/72 | HR 106 | Temp 99.5°F | Resp 20

## 2021-12-08 DIAGNOSIS — D469 Myelodysplastic syndrome, unspecified: Secondary | ICD-10-CM

## 2021-12-08 DIAGNOSIS — Z5111 Encounter for antineoplastic chemotherapy: Secondary | ICD-10-CM | POA: Diagnosis not present

## 2021-12-08 MED ORDER — ONDANSETRON HCL 8 MG PO TABS
8.0000 mg | ORAL_TABLET | Freq: Once | ORAL | Status: AC
  Administered 2021-12-08: 8 mg via ORAL
  Filled 2021-12-08: qty 1

## 2021-12-08 MED ORDER — AZACITIDINE CHEMO SQ INJECTION
75.0000 mg/m2 | Freq: Once | INTRAMUSCULAR | Status: AC
  Administered 2021-12-08: 117.5 mg via SUBCUTANEOUS
  Filled 2021-12-08: qty 4.7

## 2021-12-08 NOTE — Patient Instructions (Signed)
[facility]  Discharge Instructions: Thank you for choosing [facility] to provide your oncology and hematology care.   If you have a lab appointment with the Cancer Center, please go directly to the Cancer Center and check in at the registration area.   Wear comfortable clothing and clothing appropriate for easy access to any Portacath or PICC line.   We strive to give you quality time with your provider. You may need to reschedule your appointment if you arrive late (15 or more minutes).  Arriving late affects you and other patients whose appointments are after yours.  Also, if you miss three or more appointments without notifying the office, you may be dismissed from the clinic at the provider's discretion.      For prescription refill requests, have your pharmacy contact our office and allow 72 hours for refills to be completed.   Today you received the following chemotherapy and/or immunotherapy agents Vidaza     To help prevent nausea and vomiting after your treatment, we encourage you to take your nausea medication as directed.  BELOW ARE SYMPTOMS THAT SHOULD BE REPORTED IMMEDIATELY: *FEVER GREATER THAN 100.4 F (38 C) OR HIGHER *CHILLS OR SWEATING *NAUSEA AND VOMITING THAT IS NOT CONTROLLED WITH YOUR NAUSEA MEDICATION *UNUSUAL SHORTNESS OF BREATH *UNUSUAL BRUISING OR BLEEDING *URINARY PROBLEMS (pain or burning when urinating, or frequent urination) *BOWEL PROBLEMS (unusual diarrhea, constipation, pain near the anus) TENDERNESS IN MOUTH AND THROAT WITH OR WITHOUT PRESENCE OF ULCERS (sore throat, sores in mouth, or a toothache) UNUSUAL RASH, SWELLING OR PAIN  UNUSUAL VAGINAL DISCHARGE OR ITCHING   Items with * indicate a potential emergency and should be followed up as soon as possible or go to the Emergency Department if any problems should occur.  Please show the CHEMOTHERAPY ALERT CARD or IMMUNOTHERAPY ALERT CARD at check-in to the  Emergency Department and triage nurse.  Should you have questions after your visit or need to cancel or reschedule your appointment, please contact [facility]  Dept: [phone]  and follow the prompts.  Office hours are 8:00 a.m. to 4:30 p.m. Monday - Friday. Please note that voicemails left after 4:00 p.m. may not be returned until the following business day.  We are closed weekends and major holidays. You have access to a nurse at all times for urgent questions. Please call the main number to the clinic Dept: [phone] and follow the prompts.   For any non-urgent questions, you may also contact your provider using MyChart. We now offer e-Visits for anyone 18 and older to request care online for non-urgent symptoms. For details visit [email or web address].   Also download the MyChart app! Go to the app store, search "MyChart", open the app, select [place], and log in with your MyChart username and password.  Due to Covid, a mask is required upon entering the hospital/clinic. If you do not have a mask, one will be given to you upon arrival. For doctor visits, patients may have 1 support person aged 18 or older with them. For treatment visits, patients cannot have anyone with them due to current Covid guidelines and our immunocompromised population.   

## 2021-12-10 ENCOUNTER — Other Ambulatory Visit: Payer: Self-pay | Admitting: Physician Assistant

## 2021-12-10 ENCOUNTER — Other Ambulatory Visit: Payer: Self-pay | Admitting: Internal Medicine

## 2021-12-10 DIAGNOSIS — M1A09X0 Idiopathic chronic gout, multiple sites, without tophus (tophi): Secondary | ICD-10-CM

## 2021-12-11 ENCOUNTER — Inpatient Hospital Stay: Payer: Medicare Other | Attending: Hematology

## 2021-12-11 ENCOUNTER — Inpatient Hospital Stay (HOSPITAL_BASED_OUTPATIENT_CLINIC_OR_DEPARTMENT_OTHER): Payer: Medicare Other | Admitting: Physician Assistant

## 2021-12-11 ENCOUNTER — Other Ambulatory Visit: Payer: Self-pay

## 2021-12-11 ENCOUNTER — Inpatient Hospital Stay: Payer: Medicare Other

## 2021-12-11 ENCOUNTER — Ambulatory Visit: Payer: Medicare Other | Admitting: Hematology

## 2021-12-11 VITALS — BP 121/79 | HR 118 | Temp 99.9°F | Resp 20

## 2021-12-11 DIAGNOSIS — N183 Chronic kidney disease, stage 3 unspecified: Secondary | ICD-10-CM | POA: Insufficient documentation

## 2021-12-11 DIAGNOSIS — D61818 Other pancytopenia: Secondary | ICD-10-CM | POA: Diagnosis not present

## 2021-12-11 DIAGNOSIS — E1122 Type 2 diabetes mellitus with diabetic chronic kidney disease: Secondary | ICD-10-CM | POA: Diagnosis not present

## 2021-12-11 DIAGNOSIS — D469 Myelodysplastic syndrome, unspecified: Secondary | ICD-10-CM | POA: Diagnosis not present

## 2021-12-11 DIAGNOSIS — I129 Hypertensive chronic kidney disease with stage 1 through stage 4 chronic kidney disease, or unspecified chronic kidney disease: Secondary | ICD-10-CM | POA: Insufficient documentation

## 2021-12-11 DIAGNOSIS — E785 Hyperlipidemia, unspecified: Secondary | ICD-10-CM | POA: Insufficient documentation

## 2021-12-11 DIAGNOSIS — I447 Left bundle-branch block, unspecified: Secondary | ICD-10-CM | POA: Diagnosis not present

## 2021-12-11 DIAGNOSIS — M109 Gout, unspecified: Secondary | ICD-10-CM | POA: Diagnosis not present

## 2021-12-11 DIAGNOSIS — D4622 Refractory anemia with excess of blasts 2: Secondary | ICD-10-CM | POA: Insufficient documentation

## 2021-12-11 DIAGNOSIS — Z5111 Encounter for antineoplastic chemotherapy: Secondary | ICD-10-CM | POA: Insufficient documentation

## 2021-12-11 DIAGNOSIS — Z8673 Personal history of transient ischemic attack (TIA), and cerebral infarction without residual deficits: Secondary | ICD-10-CM | POA: Diagnosis not present

## 2021-12-11 DIAGNOSIS — R634 Abnormal weight loss: Secondary | ICD-10-CM | POA: Diagnosis not present

## 2021-12-11 DIAGNOSIS — M1A09X0 Idiopathic chronic gout, multiple sites, without tophus (tophi): Secondary | ICD-10-CM

## 2021-12-11 DIAGNOSIS — R509 Fever, unspecified: Secondary | ICD-10-CM | POA: Insufficient documentation

## 2021-12-11 LAB — CBC WITH DIFFERENTIAL (CANCER CENTER ONLY)
Abs Immature Granulocytes: 0.7 10*3/uL — ABNORMAL HIGH (ref 0.00–0.07)
Band Neutrophils: 8 %
Basophils Absolute: 0 10*3/uL (ref 0.0–0.1)
Basophils Relative: 0 %
Blasts: 8 %
Eosinophils Absolute: 0 10*3/uL (ref 0.0–0.5)
Eosinophils Relative: 0 %
HCT: 26.3 % — ABNORMAL LOW (ref 39.0–52.0)
Hemoglobin: 9.2 g/dL — ABNORMAL LOW (ref 13.0–17.0)
Lymphocytes Relative: 38 %
Lymphs Abs: 1.7 10*3/uL (ref 0.7–4.0)
MCH: 30.7 pg (ref 26.0–34.0)
MCHC: 35 g/dL (ref 30.0–36.0)
MCV: 87.7 fL (ref 80.0–100.0)
Metamyelocytes Relative: 8 %
Monocytes Absolute: 0.7 10*3/uL (ref 0.1–1.0)
Monocytes Relative: 15 %
Myelocytes: 8 %
Neutro Abs: 1 10*3/uL — ABNORMAL LOW (ref 1.7–7.7)
Neutrophils Relative %: 15 %
Platelet Count: 42 10*3/uL — ABNORMAL LOW (ref 150–400)
RBC: 3 MIL/uL — ABNORMAL LOW (ref 4.22–5.81)
RDW: 14.3 % (ref 11.5–15.5)
WBC Count: 4.4 10*3/uL (ref 4.0–10.5)
nRBC: 0 % (ref 0.0–0.2)

## 2021-12-11 LAB — CMP (CANCER CENTER ONLY)
ALT: 11 U/L (ref 0–44)
AST: 14 U/L — ABNORMAL LOW (ref 15–41)
Albumin: 3.3 g/dL — ABNORMAL LOW (ref 3.5–5.0)
Alkaline Phosphatase: 120 U/L (ref 38–126)
Anion gap: 12 (ref 5–15)
BUN: 17 mg/dL (ref 8–23)
CO2: 25 mmol/L (ref 22–32)
Calcium: 9 mg/dL (ref 8.9–10.3)
Chloride: 98 mmol/L (ref 98–111)
Creatinine: 0.93 mg/dL (ref 0.61–1.24)
GFR, Estimated: >60 mL/min (ref >60)
Glucose, Bld: 211 mg/dL — ABNORMAL HIGH (ref 70–99)
Potassium: 4.2 mmol/L (ref 3.5–5.1)
Sodium: 135 mmol/L (ref 135–145)
Total Bilirubin: 1 mg/dL (ref 0.3–1.2)
Total Protein: 6.3 g/dL — ABNORMAL LOW (ref 6.5–8.1)

## 2021-12-11 MED ORDER — AZACITIDINE CHEMO SQ INJECTION
75.0000 mg/m2 | Freq: Once | INTRAMUSCULAR | Status: AC
  Administered 2021-12-11: 117.5 mg via SUBCUTANEOUS
  Filled 2021-12-11: qty 4.7

## 2021-12-11 MED ORDER — ONDANSETRON HCL 8 MG PO TABS
8.0000 mg | ORAL_TABLET | Freq: Once | ORAL | Status: AC
  Administered 2021-12-11: 8 mg via ORAL
  Filled 2021-12-11: qty 1

## 2021-12-11 MED ORDER — COLCHICINE 0.6 MG PO TABS: 0.6000 mg | ORAL_TABLET | Freq: Every day | ORAL | 0 refills | Status: DC

## 2021-12-11 NOTE — Patient Instructions (Signed)
[facility]  Discharge Instructions: Thank you for choosing [facility] to provide your oncology and hematology care.   If you have a lab appointment with the Cancer Center, please go directly to the Cancer Center and check in at the registration area.   Wear comfortable clothing and clothing appropriate for easy access to any Portacath or PICC line.   We strive to give you quality time with your provider. You may need to reschedule your appointment if you arrive late (15 or more minutes).  Arriving late affects you and other patients whose appointments are after yours.  Also, if you miss three or more appointments without notifying the office, you may be dismissed from the clinic at the provider's discretion.      For prescription refill requests, have your pharmacy contact our office and allow 72 hours for refills to be completed.   Today you received the following chemotherapy and/or immunotherapy agents Vidaza     To help prevent nausea and vomiting after your treatment, we encourage you to take your nausea medication as directed.  BELOW ARE SYMPTOMS THAT SHOULD BE REPORTED IMMEDIATELY: *FEVER GREATER THAN 100.4 F (38 C) OR HIGHER *CHILLS OR SWEATING *NAUSEA AND VOMITING THAT IS NOT CONTROLLED WITH YOUR NAUSEA MEDICATION *UNUSUAL SHORTNESS OF BREATH *UNUSUAL BRUISING OR BLEEDING *URINARY PROBLEMS (pain or burning when urinating, or frequent urination) *BOWEL PROBLEMS (unusual diarrhea, constipation, pain near the anus) TENDERNESS IN MOUTH AND THROAT WITH OR WITHOUT PRESENCE OF ULCERS (sore throat, sores in mouth, or a toothache) UNUSUAL RASH, SWELLING OR PAIN  UNUSUAL VAGINAL DISCHARGE OR ITCHING   Items with * indicate a potential emergency and should be followed up as soon as possible or go to the Emergency Department if any problems should occur.  Please show the CHEMOTHERAPY ALERT CARD or IMMUNOTHERAPY ALERT CARD at check-in to the  Emergency Department and triage nurse.  Should you have questions after your visit or need to cancel or reschedule your appointment, please contact  CANCER CENTER MEDICAL ONCOLOGY  Dept: [phone]  and follow the prompts.  Office hours are 8:00 a.m. to 4:30 p.m. Monday - Friday. Please note that voicemails left after 4:00 p.m. may not be returned until the following business day.  We are closed weekends and major holidays. You have access to a nurse at all times for urgent questions. Please call the main number to the clinic Dept: [phone] and follow the prompts.   For any non-urgent questions, you may also contact your provider using MyChart. We now offer e-Visits for anyone 18 and older to request care online for non-urgent symptoms. For details visit [email or web address].   Also download the MyChart app! Go to the app store, search "MyChart", open the app, select , and log in with your MyChart username and password.  Due to Covid, a mask is required upon entering the hospital/clinic. If you do not have a mask, one will be given to you upon arrival. For doctor visits, patients may have 1 support person aged 18 or older with them. For treatment visits, patients cannot have anyone with them due to current Covid guidelines and our immunocompromised population.   

## 2021-12-11 NOTE — Progress Notes (Signed)
[place]   Telephone:(336) [phone] Fax:(336) [phone]   Clinic Follow up Note   Patient Care Team: Emelia Loron, NP as PCP - General (Nurse Practitioner) Jettie Booze, MD as PCP - Cardiology (Cardiology) Truitt Merle, MD as Consulting Physician (Hematology)  Date of Service:  12/11/2021  CHIEF COMPLAINT: f/u of pancytopenia/MDS  CURRENT THERAPY:  Vidaza, given days 1-5 every 28 days.  INTERVAL HISTORY:  Eugene Cooper is here for a follow up of pancytopenia/MDS. He was last seen by Eugene Cooper on 11/27/2021. Since the last visit, he started treatment with Vidaza on 12/272022. He presents to the clinic accompanied by his daughter and granddaughter who serves as the interpreter.   He reports that over the last few days, he developed flare up of his gout affecting both his knees. The pain and swelling is more severe on his right knee than his left. He ran out of his cochlinic medication and is trying to get a refill from his PCP. He is managing his pain with Norco once a day and Tylenol as needed. He has difficulty ambulating due to the gout pain.   He reports that his appetite is fair and trying to eat in order to maintain his weight. He denies nausea, vomiting or abdominal pain. His energy levels have improved since receiving blood transfusion during recent hospitalization for worsening anemia and hypotension. He denies any changes to his bowel habits. He denies easy bruising or signs of active bleeding. Patient denies fevers, chills, nights, shortness of breath, chest pain or cough. He has no other complaints.   All other systems were reviewed with the patient and are negative.  MEDICAL HISTORY:  Past Medical History:  Diagnosis Date   CKD (chronic kidney disease), stage III ([place])    Diabetes ([place])    Hyperlipemia    Hypertension    LBBB (left bundle branch block)    Stroke [facility])     SURGICAL HISTORY: Past Surgical History:  Procedure Laterality Date    BIOPSY  10/29/2021   Procedure: BIOPSY;  Surgeon: Thornton Park, MD;  Location: [place];  Service: Gastroenterology;;   ESOPHAGOGASTRODUODENOSCOPY (EGD) WITH PROPOFOL N/A 10/29/2021   Procedure: ESOPHAGOGASTRODUODENOSCOPY (EGD) WITH PROPOFOL;  Surgeon: Thornton Park, MD;  Location: [place];  Service: Gastroenterology;  Laterality: N/A;    I have reviewed the social history and family history with the patient and they are unchanged from previous note.  ALLERGIES:  has No Known Allergies.  MEDICATIONS:  Current Outpatient Medications  Medication Sig Dispense Refill   Accu-Chek Softclix Lancets lancets USE UPTO FOUR TIMES DAILY AS DIRECTED     acetaminophen (TYLENOL) 325 MG tablet Take 1-2 tablets (325-650 mg total) by mouth every 4 (four) hours as needed for mild pain.     allopurinol (ZYLOPRIM) 300 MG tablet Take 1 tablet (300 mg total) by mouth daily. 30 tablet 0   atorvastatin (LIPITOR) 40 MG tablet Take 1 tablet (40 mg total) by mouth daily at 6 PM. 30 tablet 0   blood glucose meter kit and supplies KIT Dispense based on patient and insurance preference. Use up to four times daily as directed. (FOR ICD-9 250.00, 250.01). 1 each 0   diclofenac Sodium (VOLTAREN) 1 % GEL Apply 2 g topically 4 (four) times daily. (Patient taking differently: Apply 2 g topically 4 (four) times daily as needed (knee/ankle pain).) 150 g 1   HYDROcodone-acetaminophen (NORCO/VICODIN) 5-325 MG tablet Take 0.5-1 tablets by mouth 2 (two) times daily as needed for  pain.     magnesium oxide (MAG-OX) 400 (240 Mg) MG tablet Take 0.5 tablets (200 mg total) by mouth 2 (two) times daily. 10 tablet 0   metFORMIN (GLUCOPHAGE) 1000 MG tablet Take 1,000 mg by mouth 2 (two) times daily.     ondansetron (ZOFRAN) 8 MG tablet Take 1 tablet (8 mg total) by mouth 2 (two) times daily as needed (Nausea or vomiting). 30 tablet 1   pantoprazole (PROTONIX) 40 MG tablet Take 1 tablet (40 mg total) by mouth 2 (two) times  daily. 30 tablet 1   No current facility-administered medications for this visit.   Facility-Administered Medications Ordered in Other Visits  Medication Dose Route Frequency Provider Last Rate Last Admin   azaCITIDine (VIDAZA) chemo injection 117.5 mg  75 mg/m2 (Treatment Plan Recorded) Subcutaneous Once Truitt Merle, MD       ondansetron [facility]) tablet 8 mg  8 mg Oral Once Truitt Merle, MD        PHYSICAL EXAMINATION: ECOG PERFORMANCE STATUS: 3 - Symptomatic, >50% confined to bed  There were no vitals filed for this visit.  Wt Readings from Last 3 Encounters:  12/06/21 133 lb 9.6 oz (60.6 kg)  11/29/21 123 lb 7.3 oz (56 kg)  11/27/21 124 lb 3.2 oz (56.3 kg)    GENERAL:alert, no distress and comfortable SKIN: skin color normal, no rashes or significant lesions EYES: normal, Conjunctiva are pink and non-injected, sclera clear  NEURO: alert & oriented x 3 with fluent speech EXTREMITIES: Edema in right knee and mild erythema in left knee. Bilateral ankle edema.   LABORATORY DATA:  I have reviewed the data as listed CBC Latest Ref Rng & Units 12/11/2021 12/06/2021 12/05/2021  WBC 4.0 - 10.5 K/uL 4.4 4.7 -  Hemoglobin 13.0 - 17.0 g/dL 9.2(L) 9.1(L) 9.2(L)  Hematocrit 39.0 - 52.0 % 26.3(L) 25.7(L) 26.1(L)  Platelets 150 - 400 K/uL 42(L) 67(L) -     CMP Latest Ref Rng & Units 12/11/2021 12/06/2021 12/05/2021  Glucose 70 - 99 mg/dL 211(H) 125(H) 208(H)  BUN 8 - 23 mg/dL _0 Creatinine 0.61 - 1.24 mg/dL 0.93 0.86 1.18  Sodium 135 - 145 mmol/L 135 137 136  Potassium 3.5 - 5.1 mmol/L 4.2 4.1 3.9  Chloride 98 - 111 mmol/L 98 103 99  CO2 22 - 32 mmol/L _1 Calcium 8.9 - 10.3 mg/dL 9.0 8.7(L) 9.2  Total Protein 6.5 - 8.1 g/dL 6.3(L) 5.6(L) 6.4(L)  Total Bilirubin 0.3 - 1.2 mg/dL 1.0 1.0 1.0  Alkaline Phos 38 - 126 U/L 120 88 110  AST 15 - 41 U/L 14(L) 17 16  ALT 0 - 44 U/L _2 RADIOGRAPHIC STUDIES: I have personally reviewed the radiological images as listed  and agreed with the findings in the report. No results found.    ASSESSMENT & PLAN:  Eugene Cooper is a 81 y.o. male with   1. 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 He was found to have pancytopenia with severe anemia Hgb 6.4 on admission. Further work up ruled out nutritional anemia (U82, folic acid, iron) and hemolysis -EGD showed erosive gastritis, currently asymptomatic on PPI -imaging was negative for lymphadenopathy or solid organ malignancy, it did show findings c/w pneumonia, he was treated with broad spectrum antibiotics, he is followed by ID but work up was essentially negative -He underwent bone marrow biopsy 11/08/21, which  shows hypercellular marrow with increased blasts up to 10-15%, consistent with myelodysplastic syndrome (MDS) -Her cytogenetics showed complex genetic changes (>3) including 5q- -NGS showed CALR and TP53 mutations, 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 He is high risk. We discussed 10-15% blasts is pre-leukemia (leukemia is 20% blasts) and we recommend treatment to control the disease and delay transformation. -I discussed first line treatment options, including injection or infusion 5 days ago, IV decitabine -given his current condition, poor tolerance to anesthesia which is needed for port placement, my recommendation is for vidaza injection, potential side effects including fatigue, decreased appetite, mild n/v, bowel change, risk of infection and cytopenias, frequent need for blood transfusion, risk of bleeding, were discussed. This will be given 5 days in a row each month. He agrees to proceed.  -Given his advanced age and poor performance status, supportive care along with blood transfusion and ESA are also  reasonable.  I discussed with patient and his family, he would like to try chemo first. -He started Vidaza injections on 12/04/2021.    2. Goal of care discussion  -We discussed the overall poor prognosis, especially if he does not have good response to treatment. -The patient understands the goal of care is palliative.   2. Deconditioning, weight loss  -He lost approx 40 lbs since in 6 months (since 05/2021) -he started facility PT, completed 1 visit prior to this hospitalization.  -He is not eating much, per family. He requires assistance with ADLs and walker at home. Reviewed fall precautions.  -Will resume PT now that he is home to improve PS -referred to dietician    3. Gout, current flare -He has worsening pain and swelling in the knees and ankles. Patient is trying to get refill of colchicine by his PCP. We reached out to PCP and they refused to fill since they didn't send original prescription.  -I sent refill of colchicine 0.6 mg today.  -Advised to follow up with patient if symptoms don't improve.    4. CVA, HL, HTN, DM, LBBB -BG 150 - 210 at home -continue med regimen and f/up per PCP, cards, and care team     PLAN: -Labs from today were reviewed. CBC shows stable anemia with Hgb 9.2. Platelet count has trended down to 42K. Patient denies any signs of active bleeding. Discussed results with Eugene Cooper and recommend to proceed with Vidaza injection today and tomorrow.  -Patient will return for labs on 12/13/2020 to monitor platelet count.  -RTC for labs and follow up visit with Eugene Cooper on 12/18/2020 -Due for Cycle 2 of Vidaza on 01/07/2022-01/11/2022.   No orders of the defined types were placed in this encounter.  All questions were answered. The patient knows to call the clinic with any problems, questions or concerns. No barriers to learning was detected.   I have spent a total of 30 minutes minutes of face-to-face and non-face-to-face time, preparing to see the patient,  performing a medically appropriate examination, counseling and educating the patient, ordering medications,communicating with other health care professionals, documenting clinical information in the electronic health record,  and care coordination.   Eugene Brigham, PA-C Hematology and [place] at [place]: [phone]

## 2021-12-11 NOTE — Progress Notes (Signed)
To proceed today per Birder Robson who saw pt at bedside.

## 2021-12-11 NOTE — Telephone Encounter (Signed)
Next Visit: not scheduled. No showed on 07/09/2021. Message sent to the front to schedule patient.   Last Visit: 06/04/2021   Last Fill: 11/23/2021   DX: Chronic gout of multiple sites, unspecified cause    Current Dose per office note 06/04/2021: previously tolerated medication so resuming at 300 mg daily dose   Labs: 11/15/2021 CBC: WBC 3.0, RBC 2.99, Hgb 9.3, Hct 26.5, Platelet Count 141, Neutro Abs 1.0, Abs Immature Granulocytes 0.50, BUN <5, Calcium 8.2   Okay to refill Allopurinol?

## 2021-12-12 ENCOUNTER — Inpatient Hospital Stay: Payer: Medicare Other

## 2021-12-12 ENCOUNTER — Inpatient Hospital Stay: Payer: Medicare Other | Admitting: Hematology

## 2021-12-12 VITALS — BP 144/84 | HR 99 | Temp 98.6°F | Resp 20

## 2021-12-12 DIAGNOSIS — Z5111 Encounter for antineoplastic chemotherapy: Secondary | ICD-10-CM | POA: Diagnosis not present

## 2021-12-12 DIAGNOSIS — D469 Myelodysplastic syndrome, unspecified: Secondary | ICD-10-CM

## 2021-12-12 MED ORDER — ONDANSETRON HCL 8 MG PO TABS
8.0000 mg | ORAL_TABLET | Freq: Once | ORAL | Status: AC
  Administered 2021-12-12: 8 mg via ORAL
  Filled 2021-12-12: qty 1

## 2021-12-12 MED ORDER — ALLOPURINOL 300 MG PO TABS: 300.0000 mg | ORAL_TABLET | Freq: Every day | ORAL | 5 refills | Status: AC

## 2021-12-12 MED ORDER — AZACITIDINE CHEMO SQ INJECTION
75.0000 mg/m2 | Freq: Once | INTRAMUSCULAR | Status: AC
  Administered 2021-12-12: 117.5 mg via SUBCUTANEOUS
  Filled 2021-12-12: qty 4.7

## 2021-12-12 NOTE — Patient Instructions (Signed)
[facility] ONCOLOGY  Discharge Instructions: Thank you for choosing [facility] to provide your oncology and hematology care.   If you have a lab appointment with the [facility], please go directly to the [facility] and check in at the registration area.   Wear comfortable clothing and clothing appropriate for easy access to any Portacath or PICC line.   We strive to give you quality time with your provider. You may need to reschedule your appointment if you arrive late (15 or more minutes).  Arriving late affects you and other patients whose appointments are after yours.  Also, if you miss three or more appointments without notifying the office, you may be dismissed from the clinic at the providers discretion.      For prescription refill requests, have your pharmacy contact our office and allow 72 hours for refills to be completed.    Today you received the following chemotherapy and/or immunotherapy agents Azacitidine      To help prevent nausea and vomiting after your treatment, we encourage you to take your nausea medication as directed.  BELOW ARE SYMPTOMS THAT SHOULD BE REPORTED IMMEDIATELY: *FEVER GREATER THAN 100.4 F (38 C) OR HIGHER *CHILLS OR SWEATING *NAUSEA AND VOMITING THAT IS NOT CONTROLLED WITH YOUR NAUSEA MEDICATION *UNUSUAL SHORTNESS OF BREATH *UNUSUAL BRUISING OR BLEEDING *URINARY PROBLEMS (pain or burning when urinating, or frequent urination) *BOWEL PROBLEMS (unusual diarrhea, constipation, pain near the anus) TENDERNESS IN MOUTH AND THROAT WITH OR WITHOUT PRESENCE OF ULCERS (sore throat, sores in mouth, or a toothache) UNUSUAL RASH, SWELLING OR PAIN  UNUSUAL VAGINAL DISCHARGE OR ITCHING   Items with * indicate a potential emergency and should be followed up as soon as possible or go to the Emergency Department if any problems should occur.  Please show the CHEMOTHERAPY ALERT CARD or IMMUNOTHERAPY ALERT CARD at check-in to  the Emergency Department and triage nurse.  Should you have questions after your visit or need to cancel or reschedule your appointment, please contact [facility]  Dept: [phone]  and follow the prompts.  Office hours are 8:00 a.m. to 4:30 p.m. Monday - Friday. Please note that voicemails left after 4:00 p.m. may not be returned until the following business day.  We are closed weekends and major holidays. You have access to a nurse at all times for urgent questions. Please call the main number to the clinic Dept: [phone] and follow the prompts.   For any non-urgent questions, you may also contact your provider using MyChart. We now offer e-Visits for anyone 39 and older to request care online for non-urgent symptoms. For details visit [email or web address].   Also download the MyChart app! Go to the app store, search "MyChart", open the app, select [facility], and log in with your MyChart username and password.  Due to Covid, a mask is required upon entering the hospital/clinic. If you do not have a mask, one will be given to you upon arrival. For doctor visits, patients may have 1 support person aged 30 or older with them. For treatment visits, patients cannot have anyone with them due to current Covid guidelines and our immunocompromised population.

## 2021-12-12 NOTE — Telephone Encounter (Signed)
Okay to refill. He appears to be transitioning to more palliative care with oncology service I will send them a message to make sure they aren't worried about any medicine interaction but otherwise doesn't need close follow up.

## 2021-12-13 ENCOUNTER — Inpatient Hospital Stay: Payer: Medicare Other

## 2021-12-13 ENCOUNTER — Other Ambulatory Visit: Payer: Self-pay

## 2021-12-13 DIAGNOSIS — Z5111 Encounter for antineoplastic chemotherapy: Secondary | ICD-10-CM | POA: Diagnosis not present

## 2021-12-13 DIAGNOSIS — D469 Myelodysplastic syndrome, unspecified: Secondary | ICD-10-CM

## 2021-12-13 LAB — CBC WITH DIFFERENTIAL (CANCER CENTER ONLY)
Abs Immature Granulocytes: 0.8 10*3/uL — ABNORMAL HIGH (ref 0.00–0.07)
Band Neutrophils: 17 %
Basophils Absolute: 0 10*3/uL (ref 0.0–0.1)
Basophils Relative: 0 %
Blasts: 16 %
Eosinophils Absolute: 0 10*3/uL (ref 0.0–0.5)
Eosinophils Relative: 0 %
HCT: 24 % — ABNORMAL LOW (ref 39.0–52.0)
Hemoglobin: 8.4 g/dL — ABNORMAL LOW (ref 13.0–17.0)
Lymphocytes Relative: 25 %
Lymphs Abs: 0.9 10*3/uL (ref 0.7–4.0)
MCH: 30.3 pg (ref 26.0–34.0)
MCHC: 35 g/dL (ref 30.0–36.0)
MCV: 86.6 fL (ref 80.0–100.0)
Metamyelocytes Relative: 12 %
Monocytes Absolute: 0.1 10*3/uL (ref 0.1–1.0)
Monocytes Relative: 4 %
Myelocytes: 5 %
Neutro Abs: 1.2 10*3/uL — ABNORMAL LOW (ref 1.7–7.7)
Neutrophils Relative %: 16 %
Platelet Count: 36 10*3/uL — ABNORMAL LOW (ref 150–400)
Promyelocytes Relative: 5 %
RBC Morphology: 1
RBC: 2.77 MIL/uL — ABNORMAL LOW (ref 4.22–5.81)
RDW: 14.4 % (ref 11.5–15.5)
WBC Count: 3.6 10*3/uL — ABNORMAL LOW (ref 4.0–10.5)
nRBC: 0 % (ref 0.0–0.2)

## 2021-12-13 LAB — CMP (CANCER CENTER ONLY)
ALT: 12 U/L (ref 0–44)
AST: 14 U/L — ABNORMAL LOW (ref 15–41)
Albumin: 3.3 g/dL — ABNORMAL LOW (ref 3.5–5.0)
Alkaline Phosphatase: 113 U/L (ref 38–126)
Anion gap: 11 (ref 5–15)
BUN: 22 mg/dL (ref 8–23)
CO2: 25 mmol/L (ref 22–32)
Calcium: 9.1 mg/dL (ref 8.9–10.3)
Chloride: 97 mmol/L — ABNORMAL LOW (ref 98–111)
Creatinine: 0.89 mg/dL (ref 0.61–1.24)
GFR, Estimated: >60 mL/min (ref >60)
Glucose, Bld: 250 mg/dL — ABNORMAL HIGH (ref 70–99)
Potassium: 3.9 mmol/L (ref 3.5–5.1)
Sodium: 133 mmol/L — ABNORMAL LOW (ref 135–145)
Total Bilirubin: 0.9 mg/dL (ref 0.3–1.2)
Total Protein: 6.3 g/dL — ABNORMAL LOW (ref 6.5–8.1)

## 2021-12-17 ENCOUNTER — Other Ambulatory Visit: Payer: Self-pay | Admitting: Hematology

## 2021-12-17 DIAGNOSIS — D469 Myelodysplastic syndrome, unspecified: Secondary | ICD-10-CM

## 2021-12-18 ENCOUNTER — Other Ambulatory Visit: Payer: Self-pay

## 2021-12-18 ENCOUNTER — Encounter: Payer: Self-pay | Admitting: Hematology

## 2021-12-18 ENCOUNTER — Inpatient Hospital Stay: Payer: Medicare Other

## 2021-12-18 ENCOUNTER — Inpatient Hospital Stay (HOSPITAL_BASED_OUTPATIENT_CLINIC_OR_DEPARTMENT_OTHER): Payer: Medicare Other | Admitting: Hematology

## 2021-12-18 VITALS — BP 117/77 | HR 95 | Temp 98.1°F | Resp 17 | Ht 62.0 in | Wt 123.4 lb

## 2021-12-18 DIAGNOSIS — D469 Myelodysplastic syndrome, unspecified: Secondary | ICD-10-CM | POA: Diagnosis not present

## 2021-12-18 DIAGNOSIS — Z5111 Encounter for antineoplastic chemotherapy: Secondary | ICD-10-CM | POA: Diagnosis not present

## 2021-12-18 LAB — CBC WITH DIFFERENTIAL (CANCER CENTER ONLY)
Abs Immature Granulocytes: 0.6 10*3/uL — ABNORMAL HIGH (ref 0.00–0.07)
Band Neutrophils: 5 %
Basophils Absolute: 0 10*3/uL (ref 0.0–0.1)
Basophils Relative: 0 %
Blasts: 6 %
Eosinophils Absolute: 0 10*3/uL (ref 0.0–0.5)
Eosinophils Relative: 0 %
HCT: 24.2 % — ABNORMAL LOW (ref 39.0–52.0)
Hemoglobin: 8.6 g/dL — ABNORMAL LOW (ref 13.0–17.0)
Lymphocytes Relative: 30 %
Lymphs Abs: 0.9 10*3/uL (ref 0.7–4.0)
MCH: 30.8 pg (ref 26.0–34.0)
MCHC: 35.5 g/dL (ref 30.0–36.0)
MCV: 86.7 fL (ref 80.0–100.0)
Metamyelocytes Relative: 9 %
Monocytes Absolute: 0.2 10*3/uL (ref 0.1–1.0)
Monocytes Relative: 8 %
Myelocytes: 12 %
Neutro Abs: 1 10*3/uL — ABNORMAL LOW (ref 1.7–7.7)
Neutrophils Relative %: 30 %
Platelet Count: 23 10*3/uL — ABNORMAL LOW (ref 150–400)
RBC: 2.79 MIL/uL — ABNORMAL LOW (ref 4.22–5.81)
RDW: 14.4 % (ref 11.5–15.5)
WBC Count: 2.9 10*3/uL — ABNORMAL LOW (ref 4.0–10.5)
nRBC: 0 % (ref 0.0–0.2)

## 2021-12-18 LAB — COMPREHENSIVE METABOLIC PANEL
ALT: 45 U/L — ABNORMAL HIGH (ref 0–44)
AST: 19 U/L (ref 15–41)
Albumin: 3.4 g/dL — ABNORMAL LOW (ref 3.5–5.0)
Alkaline Phosphatase: 101 U/L (ref 38–126)
Anion gap: 11 (ref 5–15)
BUN: 29 mg/dL — ABNORMAL HIGH (ref 8–23)
CO2: 23 mmol/L (ref 22–32)
Calcium: 9.2 mg/dL (ref 8.9–10.3)
Chloride: 100 mmol/L (ref 98–111)
Creatinine, Ser: 0.77 mg/dL (ref 0.61–1.24)
GFR, Estimated: >60 mL/min (ref >60)
Glucose, Bld: 207 mg/dL — ABNORMAL HIGH (ref 70–99)
Potassium: 4.6 mmol/L (ref 3.5–5.1)
Sodium: 134 mmol/L — ABNORMAL LOW (ref 135–145)
Total Bilirubin: 0.6 mg/dL (ref 0.3–1.2)
Total Protein: 6 g/dL — ABNORMAL LOW (ref 6.5–8.1)

## 2021-12-18 LAB — SAMPLE TO BLOOD BANK

## 2021-12-18 NOTE — Progress Notes (Signed)
[place]   Telephone:(336) [phone] Fax:(336) [phone]   Clinic Follow up Note   Patient Care Team: Emelia Loron, NP as PCP - General (Nurse Practitioner) Jettie Booze, MD as PCP - Cardiology (Cardiology) Truitt Merle, MD as Consulting Physician (Hematology)  Date of Service:  12/18/2021  CHIEF COMPLAINT: f/u of pancytopenia/MDS  CURRENT THERAPY:  Vidaza, given days 1-5 every 28 days, started 12/04/21 Blood: 1 unit of PRBC if hemoglobin 7.0-8.0, 2 units if hemoglobin less than 7.0 Platelet transfusion if platelet less than 15 K For blood products will be irradiated  ASSESSMENT & PLAN:  Eugene Cooper is a 81 y.o. male with   1. 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 He was found to have pancytopenia with severe anemia Hgb 6.4 on admission. Further work up ruled out nutritional anemia (L79, folic acid, iron) and hemolysis -EGD showed erosive gastritis, currently asymptomatic on PPI -imaging was negative for lymphadenopathy or solid organ malignancy, it did show findings c/w pneumonia, he was treated with broad spectrum antibiotics, he is followed by ID but work up was essentially negative -He underwent bone marrow biopsy 11/08/21, which shows hypercellular marrow with increased blasts up to 10-15%, consistent with myelodysplastic syndrome (MDS) -His cytogenetics showed complex genetic changes (>3) including 5q-. NGS showed CALR and TP53 mutations, not targetable mutations  -based on his IPSS-R, he has very high risk to develop AML, median survival is 0.8 year in this group  -He started Vidaza injections on 12/04/21. He tolerated well with no noticeable side effects. -C2 on 1/23 -He is not a candidate for intensive chemotherapy or bone marrow transplant if he develops AML  2. Pancytopenia -he was admitted on 12/05/21 with symptomatic anemia, syncopal episode. He received 2  units pRBCs during admission. -hgb 8.6 today (12/18/21), no need for transfusion. Will monitor.   3. Goal of care discussion  -We discussed the overall poor prognosis, especially if he does not have good response to treatment. -The patient understands the goal of care is palliative.   4. Deconditioning, weight loss  -He lost approx 40 lbs in 6 months (since 05/2021) -He is not eating much, per family. He requires assistance with ADLs and walker at home. Reviewed fall precautions.  -endorses taking 2 Ensure a day. -referred to dietician    5. Gout -refill of colchicine 0.6 mg given 12/11/21.    6. CVA, HL, HTN, DM, LBBB -BG 150 - 210 at home -continue med regimen and f/up per PCP, cards, and care team      PLAN: -Patient will return for labs on 12/21/21 to monitor blood counts, then every Tuesday and Fridays -Blood transfusion and platelet transfusion per parameters -lab, f/u, and Cycle 2 of Vidaza starting 1/23   No problem-specific Assessment & Plan notes found for this encounter.   SUMMARY OF ONCOLOGIC HISTORY: Oncology History  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[place])  11/09/2021 Initial Diagnosis   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[place])   12/04/2021 -  Chemotherapy   Patient is on Treatment Plan : MYELODYSPLASIA  Azacitidine SQ D1-5 q28d        INTERVAL HISTORY:  Eugene Cooper is here for a follow up of MDS. He was last seen by PA Murray Hodgkins on 12/11/21. He presents to the clinic accompanied by his daughter and an interpreter. He reports he tolerated the injections well; he denies nausea, diarrhea, dizziness. His daughter notes he was able to sleep a  little more. He reports constant pain to his knees and was given injections to both knees last week. This is a chronic problem, his daughter notes for the last 5 years.   All other systems were reviewed with the patient and are negative.  MEDICAL HISTORY:  Past Medical History:  Diagnosis Date   CKD (chronic kidney disease),  stage III ([place])    Diabetes ([place])    Hyperlipemia    Hypertension    LBBB (left bundle branch block)    Stroke [facility])     SURGICAL HISTORY: Past Surgical History:  Procedure Laterality Date   BIOPSY  10/29/2021   Procedure: BIOPSY;  Surgeon: Thornton Park, MD;  Location: [place];  Service: Gastroenterology;;   ESOPHAGOGASTRODUODENOSCOPY (EGD) WITH PROPOFOL N/A 10/29/2021   Procedure: ESOPHAGOGASTRODUODENOSCOPY (EGD) WITH PROPOFOL;  Surgeon: Thornton Park, MD;  Location: [facility];  Service: Gastroenterology;  Laterality: N/A;    I have reviewed the social history and family history with the patient and they are unchanged from previous note.  ALLERGIES:  has No Known Allergies.  MEDICATIONS:  Current Outpatient Medications  Medication Sig Dispense Refill   Accu-Chek Softclix Lancets lancets USE UPTO FOUR TIMES DAILY AS DIRECTED     acetaminophen (TYLENOL) 325 MG tablet Take 1-2 tablets (325-650 mg total) by mouth every 4 (four) hours as needed for mild pain.     allopurinol (ZYLOPRIM) 300 MG tablet Take 1 tablet (300 mg total) by mouth daily. 30 tablet 5   atorvastatin (LIPITOR) 40 MG tablet Take 1 tablet (40 mg total) by mouth daily at 6 PM. 30 tablet 0   blood glucose meter kit and supplies KIT Dispense based on patient and insurance preference. Use up to four times daily as directed. (FOR ICD-9 250.00, 250.01). 1 each 0   colchicine 0.6 MG tablet Take 1 tablet (0.6 mg total) by mouth daily. 30 tablet 0   diclofenac Sodium (VOLTAREN) 1 % GEL Apply 2 g topically 4 (four) times daily. (Patient taking differently: Apply 2 g topically 4 (four) times daily as needed (knee/ankle pain).) 150 g 1   HYDROcodone-acetaminophen (NORCO/VICODIN) 5-325 MG tablet Take 0.5-1 tablets by mouth 2 (two) times daily as needed for pain.     magnesium oxide (MAG-OX) 400 (240 Mg) MG tablet Take 0.5 tablets (200 mg total) by mouth 2 (two) times daily. 10 tablet 0   metFORMIN (GLUCOPHAGE) 1000  MG tablet Take 1,000 mg by mouth 2 (two) times daily.     ondansetron (ZOFRAN) 8 MG tablet Take 1 tablet (8 mg total) by mouth 2 (two) times daily as needed (Nausea or vomiting). 30 tablet 1   pantoprazole (PROTONIX) 40 MG tablet Take 1 tablet (40 mg total) by mouth 2 (two) times daily. 30 tablet 1   No current facility-administered medications for this visit.    PHYSICAL EXAMINATION: ECOG PERFORMANCE STATUS: 3 - Symptomatic, >50% confined to bed  Vitals:   12/18/21 0917  BP: 117/77  Pulse: 95  Resp: 17  Temp: 98.1 F (36.7 C)  SpO2: 100%   Wt Readings from Last 3 Encounters:  12/18/21 123 lb 6.4 oz (56 kg)  12/06/21 133 lb 9.6 oz (60.6 kg)  11/29/21 123 lb 7.3 oz (56 kg)     GENERAL:alert, no distress and comfortable SKIN: skin color normal, no rashes or significant lesions EYES: normal, Conjunctiva are pink and non-injected, sclera clear  NEURO: alert & oriented x 3 with fluent speech  LABORATORY DATA:  I have reviewed the data  as listed CBC Latest Ref Rng & Units 12/18/2021 12/13/2021 12/11/2021  WBC 4.0 - 10.5 K/uL 2.9(L) 3.6(L) 4.4  Hemoglobin 13.0 - 17.0 g/dL 8.6(L) 8.4(L) 9.2(L)  Hematocrit 39.0 - 52.0 % 24.2(L) 24.0(L) 26.3(L)  Platelets 150 - 400 K/uL 23(L) 36(L) 42(L)     CMP Latest Ref Rng & Units 12/18/2021 12/13/2021 12/11/2021  Glucose 70 - 99 mg/dL 207(H) 250(H) 211(H)  BUN 8 - 23 mg/dL 29(H) 22 17  Creatinine 0.61 - 1.24 mg/dL 0.77 0.89 0.93  Sodium 135 - 145 mmol/L 134(L) 133(L) 135  Potassium 3.5 - 5.1 mmol/L 4.6 3.9 4.2  Chloride 98 - 111 mmol/L 100 97(L) 98  CO2 22 - 32 mmol/L 23 25 25   Calcium 8.9 - 10.3 mg/dL 9.2 9.1 9.0  Total Protein 6.5 - 8.1 g/dL 6.0(L) 6.3(L) 6.3(L)  Total Bilirubin 0.3 - 1.2 mg/dL 0.6 0.9 1.0  Alkaline Phos 38 - 126 U/L 101 113 120  AST 15 - 41 U/L 19 14(L) 14(L)  ALT 0 - 44 U/L 45(H) 12 11      RADIOGRAPHIC STUDIES: I have personally reviewed the radiological images as listed and agreed with the findings in the report. No  results found.    No orders of the defined types were placed in this encounter.  All questions were answered. The patient knows to call the clinic with any problems, questions or concerns. No barriers to learning was detected. The total time spent in the appointment was 30 minutes.     Truitt Merle, MD 12/18/2021   I, Wilburn Mylar, am acting as scribe for Truitt Merle, MD.   I have reviewed the above documentation for accuracy and completeness, and I agree with the above.

## 2021-12-19 ENCOUNTER — Telehealth: Payer: Self-pay | Admitting: Hematology

## 2021-12-19 ENCOUNTER — Inpatient Hospital Stay: Payer: Medicare Other

## 2021-12-19 NOTE — Telephone Encounter (Signed)
Scheduled follow-up appointments per 1/10 los. Patient's son is aware.

## 2021-12-21 ENCOUNTER — Inpatient Hospital Stay: Payer: Medicare Other

## 2021-12-21 ENCOUNTER — Other Ambulatory Visit: Payer: Self-pay

## 2021-12-21 DIAGNOSIS — Z5111 Encounter for antineoplastic chemotherapy: Secondary | ICD-10-CM | POA: Diagnosis not present

## 2021-12-21 DIAGNOSIS — D469 Myelodysplastic syndrome, unspecified: Secondary | ICD-10-CM

## 2021-12-21 LAB — CBC WITH DIFFERENTIAL (CANCER CENTER ONLY)
Abs Immature Granulocytes: 0.6 10*3/uL — ABNORMAL HIGH (ref 0.00–0.07)
Band Neutrophils: 1 %
Basophils Absolute: 0 10*3/uL (ref 0.0–0.1)
Basophils Relative: 0 %
Blasts: 1 %
Eosinophils Absolute: 0 10*3/uL (ref 0.0–0.5)
Eosinophils Relative: 0 %
HCT: 23 % — ABNORMAL LOW (ref 39.0–52.0)
Hemoglobin: 8.1 g/dL — ABNORMAL LOW (ref 13.0–17.0)
Lymphocytes Relative: 53 %
Lymphs Abs: 1.4 10*3/uL (ref 0.7–4.0)
MCH: 30.1 pg (ref 26.0–34.0)
MCHC: 35.2 g/dL (ref 30.0–36.0)
MCV: 85.5 fL (ref 80.0–100.0)
Metamyelocytes Relative: 3 %
Monocytes Absolute: 0.2 10*3/uL (ref 0.1–1.0)
Monocytes Relative: 8 %
Myelocytes: 8 %
Neutro Abs: 0.5 10*3/uL — ABNORMAL LOW (ref 1.7–7.7)
Neutrophils Relative %: 16 %
Platelet Count: 23 10*3/uL — ABNORMAL LOW (ref 150–400)
Promyelocytes Relative: 10 %
RBC: 2.69 MIL/uL — ABNORMAL LOW (ref 4.22–5.81)
RDW: 14.5 % (ref 11.5–15.5)
WBC Count: 2.7 10*3/uL — ABNORMAL LOW (ref 4.0–10.5)
nRBC: 0 % (ref 0.0–0.2)

## 2021-12-21 LAB — COMPREHENSIVE METABOLIC PANEL
ALT: 30 U/L (ref 0–44)
AST: 18 U/L (ref 15–41)
Albumin: 3.6 g/dL (ref 3.5–5.0)
Alkaline Phosphatase: 100 U/L (ref 38–126)
Anion gap: 12 (ref 5–15)
BUN: 29 mg/dL — ABNORMAL HIGH (ref 8–23)
CO2: 22 mmol/L (ref 22–32)
Calcium: 8.8 mg/dL — ABNORMAL LOW (ref 8.9–10.3)
Chloride: 101 mmol/L (ref 98–111)
Creatinine, Ser: 0.83 mg/dL (ref 0.61–1.24)
GFR, Estimated: >60 mL/min (ref >60)
Glucose, Bld: 180 mg/dL — ABNORMAL HIGH (ref 70–99)
Potassium: 4.4 mmol/L (ref 3.5–5.1)
Sodium: 135 mmol/L (ref 135–145)
Total Bilirubin: 0.9 mg/dL (ref 0.3–1.2)
Total Protein: 6 g/dL — ABNORMAL LOW (ref 6.5–8.1)

## 2021-12-21 LAB — SAMPLE TO BLOOD BANK

## 2021-12-25 ENCOUNTER — Other Ambulatory Visit: Payer: Self-pay

## 2021-12-25 ENCOUNTER — Inpatient Hospital Stay: Payer: Medicare Other

## 2021-12-25 ENCOUNTER — Other Ambulatory Visit: Payer: Self-pay | Admitting: Hematology

## 2021-12-25 DIAGNOSIS — D469 Myelodysplastic syndrome, unspecified: Secondary | ICD-10-CM

## 2021-12-25 DIAGNOSIS — Z5111 Encounter for antineoplastic chemotherapy: Secondary | ICD-10-CM | POA: Diagnosis not present

## 2021-12-25 LAB — CBC WITH DIFFERENTIAL (CANCER CENTER ONLY)
Abs Immature Granulocytes: 0.3 10*3/uL — ABNORMAL HIGH (ref 0.00–0.07)
Band Neutrophils: 2 %
Basophils Absolute: 0 10*3/uL (ref 0.0–0.1)
Basophils Relative: 0 %
Blasts: 1 %
Eosinophils Absolute: 0 10*3/uL (ref 0.0–0.5)
Eosinophils Relative: 0 %
HCT: 20.6 % — ABNORMAL LOW (ref 39.0–52.0)
Hemoglobin: 7.3 g/dL — ABNORMAL LOW (ref 13.0–17.0)
Lymphocytes Relative: 51 %
Lymphs Abs: 1.2 10*3/uL (ref 0.7–4.0)
MCH: 30.5 pg (ref 26.0–34.0)
MCHC: 35.4 g/dL (ref 30.0–36.0)
MCV: 86.2 fL (ref 80.0–100.0)
Metamyelocytes Relative: 1 %
Monocytes Absolute: 0.5 10*3/uL (ref 0.1–1.0)
Monocytes Relative: 20 %
Myelocytes: 8 %
Neutro Abs: 0.4 10*3/uL — CL (ref 1.7–7.7)
Neutrophils Relative %: 15 %
Platelet Count: 16 10*3/uL — ABNORMAL LOW (ref 150–400)
Promyelocytes Relative: 2 %
RBC: 2.39 MIL/uL — ABNORMAL LOW (ref 4.22–5.81)
RDW: 14.6 % (ref 11.5–15.5)
Smear Review: NORMAL
WBC Count: 2.4 10*3/uL — ABNORMAL LOW (ref 4.0–10.5)
nRBC: 0 % (ref 0.0–0.2)

## 2021-12-25 LAB — PREPARE RBC (CROSSMATCH)

## 2021-12-25 LAB — SAMPLE TO BLOOD BANK

## 2021-12-25 NOTE — Progress Notes (Signed)
Beth in Blood Bank confirmed T&S and prepare order (for Thursday 12/27/2021).  1 unit of PRBC to be administered on 12/27/2021.  Spoke with pt's granddaughter via telephone to confirm appt on Thursday 12/27/2021 @0800 .

## 2021-12-25 NOTE — Progress Notes (Signed)
Notified Dr. Burr Medico via Secure Chart regarding pt's critical lab value of ANC 0.4 today.  Pt gets Azacitidine Injections.  Asked if Dr. Burr Medico would like for pt to return tomorrow for GCSF injection or would just like to continue to monitor pt right now.  Awaiting Dr. Ernestina Penna response.

## 2021-12-25 NOTE — Progress Notes (Signed)
Open in error

## 2021-12-27 ENCOUNTER — Other Ambulatory Visit: Payer: Self-pay | Admitting: Hematology

## 2021-12-27 ENCOUNTER — Inpatient Hospital Stay: Payer: Medicare Other

## 2021-12-27 ENCOUNTER — Other Ambulatory Visit: Payer: Self-pay

## 2021-12-27 ENCOUNTER — Inpatient Hospital Stay: Payer: Medicare Other | Admitting: Hematology

## 2021-12-27 VITALS — BP 107/67 | HR 101 | Temp 99.0°F | Resp 16

## 2021-12-27 DIAGNOSIS — D469 Myelodysplastic syndrome, unspecified: Secondary | ICD-10-CM

## 2021-12-27 DIAGNOSIS — M1A09X0 Idiopathic chronic gout, multiple sites, without tophus (tophi): Secondary | ICD-10-CM

## 2021-12-27 DIAGNOSIS — R6889 Other general symptoms and signs: Secondary | ICD-10-CM

## 2021-12-27 DIAGNOSIS — Z5111 Encounter for antineoplastic chemotherapy: Secondary | ICD-10-CM | POA: Diagnosis not present

## 2021-12-27 LAB — URINALYSIS, COMPLETE (UACMP) WITH MICROSCOPIC
Bacteria, UA: NONE SEEN
Bilirubin Urine: NEGATIVE
Glucose, UA: NEGATIVE mg/dL
Hgb urine dipstick: NEGATIVE
Ketones, ur: NEGATIVE mg/dL
Leukocytes,Ua: NEGATIVE
Nitrite: NEGATIVE
Protein, ur: NEGATIVE mg/dL
Specific Gravity, Urine: 1.023 (ref 1.005–1.030)
pH: 5 (ref 5.0–8.0)

## 2021-12-27 LAB — TYPE AND SCREEN
ABO/RH(D): O POS
Antibody Screen: NEGATIVE
Unit division: 0

## 2021-12-27 LAB — BPAM RBC
Blood Product Expiration Date: 202302142359
Unit Type and Rh: 5100

## 2021-12-27 MED ORDER — ACETAMINOPHEN 325 MG PO TABS
650.0000 mg | ORAL_TABLET | Freq: Once | ORAL | Status: AC
  Administered 2021-12-27: 650 mg via ORAL
  Filled 2021-12-27: qty 2

## 2021-12-27 MED ORDER — SODIUM CHLORIDE 0.9% IV SOLUTION
250.0000 mL | Freq: Once | INTRAVENOUS | Status: AC
  Administered 2021-12-27: 250 mL via INTRAVENOUS

## 2021-12-27 NOTE — Progress Notes (Signed)
Patient removed his blood bank bracelet prior to arrival today. New order placed for Type and Screen, lab drawn and sent, new blood bank bracelet applied. Due to time constraints, blood not able to be given in the remainder of this time slot. Patient moved to a 2pm time slot for blood transfusion today. Instructions to keep blood bank bracelet on until after transfusion complete reiterated. Family is aware of same and understands that patient is to be back at infusion today at 2pm. IV and piggytail left in place and secured with [place] and tape.

## 2021-12-27 NOTE — Patient Instructions (Signed)
Transfusin de [employer] adultos Blood Transfusion, Adult Ardelia Mems transfusin de sangre es un procedimiento en el cual se recibe sangre a travs de un tubo (catter) intravenoso. Este procedimiento puede ser [facility] siguientes casos: Un trastorno hemorrgico. Una enfermedad. Una lesin. Clementeen Hoof. La sangre puede provenir de Theatre manager persona (un donante). Tambin puede donar sangre para usted mismo. La sangre administrada en una transfusin se compone de diferentes tipos de clulas. Pueden darle lo siguiente: Glbulos rojos. Transportan el oxgeno a las clulas del organismo. Glbulos blancos. Ayudan a combatir las infecciones. Plaquetas. Ayudan a la Musician. Plasma. Esta es la parte lquida de la [place]. Transporta protenas y otras sustancias por el cuerpo. Si tiene un trastorno de Proofreader, tambin puede recibir otro tipo de hemoderivados. Consulte al mdico acerca de lo siguiente: Cualquier trastorno de la sangre que tenga. Cualquier reaccin que haya tenido durante una transfusin de [place] anterior. Cualquier alergia que tenga. Todos los [employer], incluidos vitaminas, hierbas, gotas oftlmicas, cremas y medicamentos de venta libre. Cirugas a las que se haya sometido. Cualquier afeccin mdica que tenga. Esto incluye fiebre o sntomas de resfro recientes. Si est embarazada o podra estarlo. Cules son los riesgos? En general, se trata de un procedimiento seguro. Sin embargo, pueden ocurrir complicaciones. Los problemas ms frecuentes son: Ardelia Mems reaccin alrgica leve. Esta incluye zonas de la piel hinchadas y enrojecidas (urticaria) y picazn. Fiebre o escalofros. Esta puede ser la respuesta del cuerpo a las nuevas clulas sanguneas recibidas. Puede ocurrir [place] 4 horas despus de la transfusin. Los problemas ms graves pueden incluir: Demasiado lquido [employer]. Esto puede causar problemas respiratorios. Una  reaccin alrgica grave. Esto incluye dificultad para respirar o hinchazn alrededor de la cara y los labios. Lesin pulmonar. Causa problemas respiratorios y bajo nivel de oxgeno en la sangre. Puede ocurrir unas horas o [employer] de la transfusin. Exceso de hierro. Puede ocurrir despus de recibir muchas transfusiones de sangre durante un perodo de [place]. Una infeccin o un virus que se contagia a travs de [employer]. Esto es poco frecuente. La sangre donada se analiza cuidadosamente antes de administrarla. El sistema de defensa del cuerpo (sistema inmunitario) puede tratar de atacar a las clulas sanguneas nuevas. Esto es poco frecuente. Los sntomas pueden incluir fiebre, escalofros, nuseas, presin arterial baja y Social research officer, government en la parte baja de la espalda o dolor de pecho. Clulas donadas que atacan tejidos sanos. Esto es poco frecuente. Qu ocurre antes del procedimiento? Medicamentos Consulte al mdico si debe hacer o no lo siguiente: Quarry manager o suspender los medicamentos que recibe normalmente. Esto es importante. Tomar aspirina e ibuprofeno. No tome estos medicamentos a menos que el mdico se lo indique. Tomar medicamentos de [employer], vitaminas, hierbas y suplementos. Instrucciones generales Siga las instrucciones del mdico respecto de lo que no puede comer o beber. Le realizarn un anlisis de sangre para determinar su grupo sanguneo. El anlisis tambin determina qu tipo de sangre aceptar su organismo y busca la compatibilidad con el tipo de donante. Si tiene [place], puede donar su propia sangre. Esto se Fish farm manager en caso de que necesite una transfusin. Le controlarn la temperatura, la presin arterial y el pulso. Es posible que reciba medicamentos para prevenir Nurse, mental health. Esto se puede realizar si ha tenido una reaccin a una transfusin antes. Este medicamento se puede administrar por boca o a travs de un tubo (catter)  intravenoso. Este procedimiento demora  entre 1 y 4 horas. Planifique el tiempo que necesita. Qu ocurre durante el procedimiento?  Se le colocar un tubo (catter) intravenoso en una de las venas. La bolsa de sangre donada se conectar al tubo (catter) intravenoso. Luego, la sangre ingresar a la vena. Le controlarn la temperatura, la presin arterial y el pulso con frecuencia. Esto se realiza para detectar signos tempranos de una reaccin a la transfusin. Informe de inmediato al enfermero si tiene alguno de estos sntomas: Falta de aire o dificultad para respirar. Dolor de pecho o de espalda. Fiebre o escalofros. Zonas enrojecidas e hinchadas en la piel o picazn. Si tiene signos o sntomas de una reaccin, se suspender la transfusin. Tambin pueden recetarle medicamentos. Una vez terminada la transfusin, le retirarn el tubo (catter) intravenoso. Tal vez le apliquen presin en TEFL teacher donde se coloc la va intravenosa durante unos minutos. Se le colocar un vendaje (apsito) en el lugar de la va intravenosa (i.v.). Este procedimiento puede variar segn el mdico y el hospital. Sander Nephew ocurre despus del procedimiento? Lo controlarn hasta que deje el hospital o la [place]. Esto incluye controlar la temperatura, la presin arterial, el pulso, la frecuencia respiratoria, y el nivel de oxgeno en la sangre. Le analizarn su sangre para saber cmo est respondiendo a la transfusin. Es posible que le mantengan la temperatura con lquidos o [place]. Esto se hace para Chiropractor. Si le realizan el procedimiento en un entorno ambulatorio, le indicarn con quin debe ponerse en contacto para informar cualquier reaccin. Dnde buscar ms informacin Para obtener ms informacin, visite la [employer] ([place]): redcross.org Resumen Una transfusin de [place] es un procedimiento en el cual se recibe sangre a travs de un tubo  (catter) intravenoso. La sangre puede provenir de Theatre manager persona (un donante). Tambin puede donar sangre para usted mismo. La sangre que recibe se compone de diferentes tipos de clulas. Puede recibir glbulos rojos, plaquetas, plasma o glbulos blancos. Le controlarn la temperatura, la presin arterial y el pulso con frecuencia. Despus del procedimiento, es posible que analicen su sangre para saber cmo est respondiendo. Esta informacin no tiene Marine scientist el consejo del mdico. Asegrese de hacerle al mdico cualquier pregunta que tenga. Document Revised: 07/13/2019 Document Reviewed: 07/13/2019 Elsevier Patient Education  [place].

## 2021-12-28 ENCOUNTER — Other Ambulatory Visit: Payer: Self-pay

## 2021-12-28 ENCOUNTER — Inpatient Hospital Stay: Payer: Medicare Other

## 2021-12-28 DIAGNOSIS — Z5111 Encounter for antineoplastic chemotherapy: Secondary | ICD-10-CM | POA: Diagnosis not present

## 2021-12-28 DIAGNOSIS — D469 Myelodysplastic syndrome, unspecified: Secondary | ICD-10-CM

## 2021-12-28 LAB — CBC WITH DIFFERENTIAL (CANCER CENTER ONLY)
Abs Immature Granulocytes: 0.2 10*3/uL — ABNORMAL HIGH (ref 0.00–0.07)
Band Neutrophils: 4 %
Basophils Absolute: 0 10*3/uL (ref 0.0–0.1)
Basophils Relative: 0 %
Blasts: 2 %
Eosinophils Absolute: 0 10*3/uL (ref 0.0–0.5)
Eosinophils Relative: 0 %
HCT: 26.1 % — ABNORMAL LOW (ref 39.0–52.0)
Hemoglobin: 9.3 g/dL — ABNORMAL LOW (ref 13.0–17.0)
Lymphocytes Relative: 75 %
Lymphs Abs: 1.6 10*3/uL (ref 0.7–4.0)
MCH: 30.1 pg (ref 26.0–34.0)
MCHC: 35.6 g/dL (ref 30.0–36.0)
MCV: 84.5 fL (ref 80.0–100.0)
Metamyelocytes Relative: 4 %
Monocytes Absolute: 0.1 10*3/uL (ref 0.1–1.0)
Monocytes Relative: 4 %
Myelocytes: 3 %
Neutro Abs: 0.2 10*3/uL — CL (ref 1.7–7.7)
Neutrophils Relative %: 7 %
Platelet Count: 12 10*3/uL — ABNORMAL LOW (ref 150–400)
Promyelocytes Relative: 1 %
RBC: 3.09 MIL/uL — ABNORMAL LOW (ref 4.22–5.81)
RDW: 15.2 % (ref 11.5–15.5)
WBC Count: 2.1 10*3/uL — ABNORMAL LOW (ref 4.0–10.5)
nRBC: 0 % (ref 0.0–0.2)

## 2021-12-28 LAB — TYPE AND SCREEN
ABO/RH(D): O POS
Antibody Screen: NEGATIVE
Unit division: 0

## 2021-12-28 LAB — URINE CULTURE: Culture: <10000 — AB

## 2021-12-28 LAB — BPAM RBC
Blood Product Expiration Date: 202302142359
ISSUE DATE / TIME: 202301191418
Unit Type and Rh: 5100

## 2021-12-28 NOTE — Progress Notes (Signed)
Pt's Hbg today is now 9.4 but his ANC is 0.2 today.  Notified Dr. Burr Medico of pt's [place] since the pt will be starting his next cycle of Azacitidine on 12/31/2021.  Awaiting Dr. 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

## 2021-12-31 ENCOUNTER — Inpatient Hospital Stay (HOSPITAL_COMMUNITY)
Admission: EM | Admit: 2021-12-31 | Discharge: 2022-01-05 | DRG: 554 | Disposition: A | Discharge status: Home / Self Care | Payer: Medicare Other | Source: Ambulatory Visit | Attending: Internal Medicine | Admitting: Internal Medicine

## 2021-12-31 ENCOUNTER — Inpatient Hospital Stay: Payer: Medicare Other

## 2021-12-31 ENCOUNTER — Encounter: Payer: Self-pay | Admitting: Hematology

## 2021-12-31 ENCOUNTER — Other Ambulatory Visit: Payer: Self-pay

## 2021-12-31 ENCOUNTER — Ambulatory Visit: Payer: Medicare Other

## 2021-12-31 ENCOUNTER — Emergency Department (HOSPITAL_COMMUNITY): Payer: Medicare Other

## 2021-12-31 ENCOUNTER — Inpatient Hospital Stay (HOSPITAL_BASED_OUTPATIENT_CLINIC_OR_DEPARTMENT_OTHER): Payer: Medicare Other | Admitting: Hematology

## 2021-12-31 ENCOUNTER — Encounter (HOSPITAL_COMMUNITY): Payer: Self-pay

## 2021-12-31 VITALS — BP 118/75 | HR 122 | Temp 98.5°F | Resp 16

## 2021-12-31 DIAGNOSIS — Z20822 Contact with and (suspected) exposure to covid-19: Secondary | ICD-10-CM | POA: Diagnosis not present

## 2021-12-31 DIAGNOSIS — Z8673 Personal history of transient ischemic attack (TIA), and cerebral infarction without residual deficits: Secondary | ICD-10-CM

## 2021-12-31 DIAGNOSIS — M25562 Pain in left knee: Secondary | ICD-10-CM | POA: Diagnosis present

## 2021-12-31 DIAGNOSIS — K219 Gastro-esophageal reflux disease without esophagitis: Secondary | ICD-10-CM | POA: Diagnosis not present

## 2021-12-31 DIAGNOSIS — M25461 Effusion, right knee: Secondary | ICD-10-CM | POA: Diagnosis present

## 2021-12-31 DIAGNOSIS — D696 Thrombocytopenia, unspecified: Secondary | ICD-10-CM

## 2021-12-31 DIAGNOSIS — Z8249 Family history of ischemic heart disease and other diseases of the circulatory system: Secondary | ICD-10-CM

## 2021-12-31 DIAGNOSIS — D61818 Other pancytopenia: Secondary | ICD-10-CM

## 2021-12-31 DIAGNOSIS — Z79899 Other long term (current) drug therapy: Secondary | ICD-10-CM | POA: Diagnosis not present

## 2021-12-31 DIAGNOSIS — R509 Fever, unspecified: Secondary | ICD-10-CM | POA: Diagnosis not present

## 2021-12-31 DIAGNOSIS — M25469 Effusion, unspecified knee: Secondary | ICD-10-CM

## 2021-12-31 DIAGNOSIS — D469 Myelodysplastic syndrome, unspecified: Secondary | ICD-10-CM

## 2021-12-31 DIAGNOSIS — E440 Moderate protein-calorie malnutrition: Secondary | ICD-10-CM | POA: Diagnosis not present

## 2021-12-31 DIAGNOSIS — M25061 Hemarthrosis, right knee: Secondary | ICD-10-CM | POA: Diagnosis not present

## 2021-12-31 DIAGNOSIS — E1165 Type 2 diabetes mellitus with hyperglycemia: Secondary | ICD-10-CM | POA: Diagnosis present

## 2021-12-31 DIAGNOSIS — M109 Gout, unspecified: Secondary | ICD-10-CM | POA: Diagnosis not present

## 2021-12-31 DIAGNOSIS — R2689 Other abnormalities of gait and mobility: Secondary | ICD-10-CM | POA: Diagnosis not present

## 2021-12-31 DIAGNOSIS — D708 Other neutropenia: Secondary | ICD-10-CM

## 2021-12-31 DIAGNOSIS — E785 Hyperlipidemia, unspecified: Secondary | ICD-10-CM | POA: Diagnosis present

## 2021-12-31 DIAGNOSIS — L03115 Cellulitis of right lower limb: Secondary | ICD-10-CM

## 2021-12-31 DIAGNOSIS — R6510 Systemic inflammatory response syndrome (SIRS) of non-infectious origin without acute organ dysfunction: Secondary | ICD-10-CM | POA: Diagnosis not present

## 2021-12-31 DIAGNOSIS — I10 Essential (primary) hypertension: Secondary | ICD-10-CM | POA: Diagnosis not present

## 2021-12-31 DIAGNOSIS — Z833 Family history of diabetes mellitus: Secondary | ICD-10-CM

## 2021-12-31 DIAGNOSIS — G8191 Hemiplegia, unspecified affecting right dominant side: Secondary | ICD-10-CM

## 2021-12-31 DIAGNOSIS — Z823 Family history of stroke: Secondary | ICD-10-CM | POA: Diagnosis not present

## 2021-12-31 LAB — COMPREHENSIVE METABOLIC PANEL
ALT: 10 U/L (ref 0–44)
AST: 9 U/L — ABNORMAL LOW (ref 15–41)
Albumin: 3.5 g/dL (ref 3.5–5.0)
Alkaline Phosphatase: 93 U/L (ref 38–126)
Anion gap: 11 (ref 5–15)
BUN: 15 mg/dL (ref 8–23)
CO2: 23 mmol/L (ref 22–32)
Calcium: 9 mg/dL (ref 8.9–10.3)
Chloride: 98 mmol/L (ref 98–111)
Creatinine, Ser: 0.79 mg/dL (ref 0.61–1.24)
GFR, Estimated: >60 mL/min (ref >60)
Glucose, Bld: 288 mg/dL — ABNORMAL HIGH (ref 70–99)
Potassium: 3.9 mmol/L (ref 3.5–5.1)
Sodium: 132 mmol/L — ABNORMAL LOW (ref 135–145)
Total Bilirubin: 1 mg/dL (ref 0.3–1.2)
Total Protein: 6 g/dL — ABNORMAL LOW (ref 6.5–8.1)

## 2021-12-31 LAB — CBC WITH DIFFERENTIAL (CANCER CENTER ONLY)
Abs Immature Granulocytes: 0.2 10*3/uL — ABNORMAL HIGH (ref 0.00–0.07)
Band Neutrophils: 14 %
Basophils Absolute: 0 10*3/uL (ref 0.0–0.1)
Basophils Relative: 1 %
Blasts: 9 %
Eosinophils Absolute: 0 10*3/uL (ref 0.0–0.5)
Eosinophils Relative: 0 %
HCT: 21.3 % — ABNORMAL LOW (ref 39.0–52.0)
Hemoglobin: 7.5 g/dL — ABNORMAL LOW (ref 13.0–17.0)
Lymphocytes Relative: 25 %
Lymphs Abs: 0.4 10*3/uL — ABNORMAL LOW (ref 0.7–4.0)
MCH: 29.9 pg (ref 26.0–34.0)
MCHC: 35.2 g/dL (ref 30.0–36.0)
MCV: 84.9 fL (ref 80.0–100.0)
Metamyelocytes Relative: 8 %
Monocytes Absolute: 0.1 10*3/uL (ref 0.1–1.0)
Monocytes Relative: 6 %
Myelocytes: 3 %
Neutro Abs: 0.7 10*3/uL — ABNORMAL LOW (ref 1.7–7.7)
Neutrophils Relative %: 30 %
Platelet Count: 8 10*3/uL — CL (ref 150–400)
Promyelocytes Relative: 4 %
RBC: 2.51 MIL/uL — ABNORMAL LOW (ref 4.22–5.81)
RDW: 14.6 % (ref 11.5–15.5)
WBC Count: 1.5 10*3/uL — ABNORMAL LOW (ref 4.0–10.5)
nRBC: 0 % (ref 0.0–0.2)

## 2021-12-31 LAB — SEDIMENTATION RATE: Sed Rate: 58 mm/hr — ABNORMAL HIGH (ref 0–16)

## 2021-12-31 LAB — SAMPLE TO BLOOD BANK

## 2021-12-31 LAB — PREPARE RBC (CROSSMATCH)

## 2021-12-31 MED ORDER — SODIUM CHLORIDE 0.9% IV SOLUTION
250.0000 mL | Freq: Once | INTRAVENOUS | Status: AC
  Administered 2021-12-31: 250 mL via INTRAVENOUS

## 2021-12-31 NOTE — Progress Notes (Signed)
No Vidaza today per Dr Burr Medico, 1U platelets and 1U blood only

## 2021-12-31 NOTE — Progress Notes (Signed)
Beth in [facility] confirmed orders for T&S, Prepare for 1unit PRBCs & 1unit Plts.

## 2021-12-31 NOTE — ED Provider Triage Note (Signed)
Emergency Medicine Provider Triage Evaluation Note  Eugene Cooper , a 81 y.o. male  was evaluated in triage.  Pt complains of bilateral knee pain. He states that same has been ongoing for the past 2 months with worsening in the past few days. Went to his primary doctor who was concerned for septic arthritis and attempted aspiration without success. Patient with hx of MDS with pancytopenia. Denies fevers, chills, n/v/d  Review of Systems  Positive:  Negative: See above  Physical Exam  BP (!) 135/110    Pulse (!) 114    Temp 98.5 F (36.9 C)    Resp 17    Ht 5\' 2"  (1.575 m)    Wt 55.8 kg    SpO2 98%    BMI 22.50 kg/m  Gen:   Awake, no distress   Resp:  Normal effort  MSK:   Moves extremities without difficulty  Other:  Erythema, warmth, and edema present to bilateral anterior knees. Bilateral DP and PT pulses present  Medical Decision Making  Medically screening exam initiated at 8:28 PM.  Appropriate orders placed.  Cinque Begley was informed that the remainder of the evaluation will be completed by another provider, this initial triage assessment does not replace that evaluation, and the importance of remaining in the ED until their evaluation is complete.  Patient had CBC, CMP drawn this am. Neutropenic at 1.5   Bud Face, Hershal Coria 12/31/21 2031

## 2021-12-31 NOTE — Patient Instructions (Addendum)
Transfusin de plaquetas Platelet Transfusion La transfusin de plaquetas es un procedimiento en el cual una persona recibe plaquetas de un donante a travs de una va intravenosa. Las plaquetas son fragmentos de la sangre que se unen y forman un cogulo para ayudar a que el cuerpo deje de Therapist, art despus de una lesin. Si tiene [employer], es posible que su sangre tenga problemas para Designer, fashion/clothing. Esto puede causar sangrado y moretones con mucha facilidad. Tal vez deba recibir una transfusin de plaquetas si tiene una enfermedad que reduce el nmero de plaquetas (trombocitopenia). Se puede usar una transfusin de plaquetas para detener o evitar el sangrado excesivo. Informe al mdico acerca de lo siguiente: Las reacciones que haya tenido durante transfusiones previas. Cualquier alergia que tenga. Todos los [employer] [place], incluidos vitaminas, hierbas, gotas oftlmicas, cremas y medicamentos de venta libre. Cualquier problema de la sangre que tenga. Cirugas a las que se haya sometido. Cualquier afeccin mdica que tenga. Si est embarazada o podra estarlo. Cules son los riesgos? En general, se trata de un procedimiento seguro. No obstante, pueden ocurrir complicaciones, por ejemplo: Cristy Hilts. Infeccin. Reaccin alrgica a las plaquetas donadas (de un donante). Que el sistema de su cuerpo que combate las enfermedades (sistema inmunitario) ataque las plaquetas del donante (reaccin hemoltica). Esto es poco frecuente. Una reaccin poco frecuente que causa dao pulmonar (lesin pulmonar aguda producida por transfusin). Qu ocurre antes del procedimiento? Medicamentos Consulte al mdico si debe hacer o no lo siguiente: Quarry manager o suspender los medicamentos que [place] habitualmente. Esto es muy importante si toma medicamentos para la diabetes o anticoagulantes. Tomar medicamentos como aspirina e ibuprofeno. Estos medicamentos pueden tener un efecto anticoagulante en la [place]. No tome  estos medicamentos a menos que el mdico se lo indique. Usar medicamentos de venta libre, vitaminas, hierbas y suplementos. Indicaciones generales Se le realizar un anlisis de sangre para determinar su grupo sanguneo. Su tipo de sangre determina qu tipo de plaquetas recibir. Siga las instrucciones del mdico respecto de las restricciones en las comidas o las bebidas. Si tuvo una reaccin alrgica a una transfusin en el pasado, tal vez le administren un medicamento que ayude a evitarla. Le controlarn la temperatura, la presin arterial, el pulso y la respiracin. Qu ocurre durante el procedimiento?  Le colocarn una va intravenosa (i.v.) en una vena. Por su seguridad, dos mdicos verificarn su identidad y las plaquetas del donante que se le van a infundir. Una bolsa con las plaquetas del donante se conectar a su va intravenosa. Las plaquetas ingresarn en su torrente sanguneo. Esto generalmente demora entre 30 y 62 minutos. Durante la transfusin, le controlarn la temperatura, la presin arterial, el pulso y la respiracin. Esto ayuda a detectar signos tempranos de una reaccin. Tambin lo controlarn para Hydrographic surveyor otros sntomas que puedan indicar una reaccin, como escalofros, urticaria o picazn. Si tiene signos de Freight forwarder, se suspender la transfusin y tal vez le administren un medicamento para Producer, television/film/video. Una vez finalizada la transfusin, se retirar la va intravenosa. Se puede aplicar presin en el lugar de la va intravenosa (i.v.) para detener cualquier sangrado. El lugar de la va intravenosa (i.v.) se cubrir con un vendaje (venda). El procedimiento puede variar segn el mdico y el hospital. Qu puedo esperar despus del procedimiento? Le controlarn la presin arterial, la temperatura, el pulso y la respiracin hasta que abandone el hospital o la clnica. Es posible que tenga algunos moretones y Information systems manager de la  va  intravenosa (i.v.). Siga estas indicaciones en su casa: Medicamentos Use los medicamentos de venta libre y los recetados solamente como se lo haya indicado el mdico. Hable con el mdico antes de tomar cualquier medicamento que contenga aspirina o antiinflamatorios no esteroideos (AINE), como el ibuprofeno. Estos medicamentos aumentan el riesgo de tener una hemorragia peligrosa. Cuidado del lugar de la va intravenosa (i.v.) Controle el lugar de la va intravenosa (i.v.) [employer] para descartar signos de infeccin. Est atento a los siguientes signos: Enrojecimiento, [place]. Lquido o sangre. Si sale lquido o sangre del Environmental consultant de la va intravenosa (i.v.), ejerza presin firme con las manos sobre el vendaje que cubre la zona durante uno o dos minutos. Al hacerlo, debera detenerse el sangrado. [place]. Pus o mal olor. Indicaciones generales [place] o retire Secretary/administrator se lo haya indicado el mdico. Retome sus actividades normales como se lo haya indicado el mdico. Pregntele al mdico qu actividades son seguras para usted. No tome baos de inmersin, no nade ni use el jacuzzi hasta que el mdico lo autorice. Pregntele al mdico si puede ducharse. Concurra a [place]. Esto es importante. Comunquese con un mdico si: Siente un dolor de cabeza que no se alivia con medicamentos. Tiene ronchas, una erupcin cutnea o picazn en la piel. Tiene nuseas o vmitos. Se siente cansado o dbil de un modo que no es habitual. Tiene signos de infeccin en el lugar de la va intravenosa (i.v.). Solicite ayuda de inmediato si: Tiene fiebre o escalofros. Orina con menos frecuencia que lo habitual. La orina es de un color ms oscuro que lo normal. Tiene alguno de los siguientes sntomas: Dificultad para Ambulance person. Dolor en la espalda, el abdomen o el pecho. Piel fra y hmeda. Latidos cardacos acelerados. Resumen Las plaquetas son fragmentos diminutos de  clulas sanguneas que se agrupan para formar un cogulo de sangre cuando se produce una herida. Si tiene [employer], es posible que su sangre tenga problemas para Designer, fashion/clothing. La transfusin de plaquetas es un procedimiento en el cual se reciben plaquetas de un donante a travs de una va intravenosa. Se puede usar una transfusin de plaquetas para detener o evitar el sangrado excesivo. Despus del procedimiento, controle el lugar de la va intravenosa (i.v.) todos los das para descartar signos de infeccin. Esta informacin no tiene Marine scientist el consejo del mdico. Asegrese de hacerle al mdico cualquier pregunta que tenga. Document Revised: 06/27/2021 Document Reviewed: 06/27/2021 Elsevier Patient Education  2022 [place] en los adultos Blood Transfusion, Adult Ardelia Mems transfusin de sangre es un procedimiento en el que se recibe sangre o un tipo de clulas sanguneas (componentes sanguneos) a travs de una va intravenosa. Es posible que necesite una transfusin de sangre cuando su nivel de sangre sea [place]. Esto puede deberse a un trastorno hemorrgico, una enfermedad, lesin o [place]. La sangre puede provenir de un donante. Tambin se puede donar sangre para uno mismo (donacin de [place]) antes de [place] programada. La sangre administrada en una transfusin est formada por diferentes componentes sanguneos. Puede recibir lo siguiente: Glbulos rojos. Transportan el oxgeno a las clulas del organismo. Plaquetas. Ayudan a la Musician. Plasma. Esta es la parte lquida de la [place]. Transporta protenas y otras sustancias a todo el cuerpo. Glbulos blancos. Ayudan a combatir las infecciones. Si tiene hemofilia u otro trastorno de [place], tambin puede recibir otro tipo de hemoderivados. Informe al mdico  acerca de lo siguiente: Cualquier trastorno de [employer]. Cualquier reaccin que haya tenido durante  una transfusin de [place]. Cualquier alergia que tenga. Todos los [employer], incluidos vitaminas, hierbas, gotas oftlmicas, cremas y medicamentos de venta libre. Cirugas a las que se haya sometido. Cualquier afeccin que tenga, incluidos sntomas recientes de resfriado o fiebre. Si est embarazada o podra estarlo. Cules son los riesgos? En general, se trata de un procedimiento seguro. Sin embargo, pueden ocurrir complicaciones. Los problemas ms frecuentes son: Ardelia Mems reaccin alrgica leve, como zonas de piel hinchadas y enrojecidas (urticaria) y picazn. Fiebre o escalofros. Esta puede ser la respuesta del cuerpo a las nuevas clulas sanguneas recibidas. Puede ocurrir [place] 4 horas despus de la transfusin. Los problemas ms graves pueden incluir: Sobrecarga circulatoria asociada a una transfusin (SCAT) o demasiado lquido en los pulmones. Esto puede causar problemas respiratorios. Una reaccin alrgica grave, como dificultad para respirar o hinchazn alrededor de la cara y los labios. Lesin pulmonar aguda relacionada con una transfusin (LPART), que causa dificultades respiratorias y baja concentracin de oxgeno en la sangre. Esta lesin puede ocurrir unas horas o varios das despus de la transfusin. Sobrecarga de hierro. Esto puede ocurrir despus de recibir muchas transfusiones de sangre durante un perodo de [place]. Transmisin de infecciones o virus. Esto es poco frecuente porque la sangre del donante se analiza cuidadosamente antes de administrarla. Reaccin transfusional hemoltica. Esto es poco frecuente. Ocurre cuando el sistema de defensa del cuerpo (sistema inmunitario)intenta atacar a las clulas sanguneas nuevas. Los sntomas pueden incluir fiebre, escalofros, nuseas, presin arterial baja y Social research officer, government en la parte baja de la espalda o dolor de pecho. Enfermedad de injerto contra husped asociada a una transfusin (EICH-AT). Esto es poco frecuente.  Se produce cuando las clulas del donante atacan los tejidos sanos del cuerpo del receptor. Qu ocurre antes del procedimiento? Medicamentos Consulte al mdico sobre: Quarry manager o suspender los medicamentos que [place] habitualmente. Esto es muy importante si toma medicamentos para la diabetes o anticoagulantes. Tomar medicamentos como aspirina e ibuprofeno. Estos medicamentos pueden tener un efecto anticoagulante en la [place]. No tome estos medicamentos a menos que el mdico se lo indique. Tomar medicamentos de [employer], vitaminas, hierbas y suplementos. Instrucciones generales Siga las instrucciones del mdico respecto de las restricciones de comidas y bebidas. Se le realizar un anlisis de sangre para determinar su grupo sanguneo. Esto es necesario para saber qu clase de sangre aceptar su cuerpo y para que coincida con la sangre del donante. Si se someter a Horticulturist, commercial, es posible que pueda realizar una donacin de [place]. Esto se Production designer, theatre/television/film de que necesite una transfusin. Le controlarn la temperatura, la presin arterial y el pulso antes de la transfusin. Si tuvo una reaccin alrgica a una transfusin en el pasado, tal vez le administren un medicamento que ayude a evitarla. Este medicamento se puede administrar por la boca (por va oral) o a travs de una va intravenosa. Reserve un tiempo para la transfusin de [place]. Este procedimiento generalmente demora de 1 a 4 horas en completarse. Qu ocurre durante el procedimiento?  Le colocarn una va intravenosa en una vena. La bolsa de sangre donada se conectar a la va intravenosa. La sangre luego se Hotel manager en la vena. Le controlarn la temperatura, la presin arterial y el pulso peridicamente durante la transfusin. Este control se realiza para detectar signos tempranos de una reaccin a la transfusin. Informe de inmediato al enfermero  si tiene alguno de estos sntomas durante la  transfusin: Falta de aire o dificultad para respirar. Dolor de pecho o de espalda. Fiebre o escalofros. Urticaria o picazn. Si tiene signos o sntomas de Hospital doctor, se suspender la transfusin y tal vez le administren un medicamento. Una vez finalizada la transfusin, le retirarn la va intravenosa. Se puede aplicar presin en el lugar donde se coloc la va intravenosa. Le colocarn una venda (vendaje). El procedimiento puede variar segn el mdico y el hospital. Sander Nephew ocurre despus del procedimiento? Le controlarn la temperatura, la presin arterial, el pulso, la frecuencia respiratoria y Retail buyer de oxgeno en la sangre hasta que le den el alta del hospital o la clnica. Le analizarn su sangre para saber cmo est respondiendo a la transfusin. Es posible que lo calienten con lquidos o mantas para Theatre manager una Firefighter normal. Si recibe la transfusin de sangre en un entorno ambulatorio, le indicarn con quin debe ponerse en contacto para informar cualquier reaccin. Dnde buscar ms informacin Para obtener ms informacin sobre transfusiones de [place], visite la [employer] ([place]): redcross.org Resumen Una transfusin de sangre es un procedimiento en el que se recibe sangre o un tipo de clulas sanguneas (componentes sanguneos) a travs de una va intravenosa. La sangre que recibe puede provenir de un donante o de usted mismo (donacin de sangre Youth worker) antes de una ciruga programada. La sangre administrada en una transfusin est formada por diferentes componentes sanguneos. Puede recibir glbulos rojos, plaquetas, plasma o glbulos blancos, segn la afeccin que se vaya a tratar. Le controlarn la temperatura, la presin arterial y el pulso antes, durante y despus de la transfusin. Despus de la transfusin, pueden analizarle la sangre para determinar cmo ha respondido su organismo. Esta informacin no tiene Marine scientist el  consejo del mdico. Asegrese de hacerle al mdico cualquier pregunta que tenga. Document Revised: 07/14/2019 Document Reviewed: 07/14/2019 Elsevier Patient Education  [place].

## 2021-12-31 NOTE — Progress Notes (Signed)
Eugene Cooper   [phone] Fax:([phone]   Clinic Follow up Note   Patient Care Team: Emelia Loron, NP as PCP - General (Nurse Practitioner) Jettie Booze, MD as PCP - Cardiology (Cardiology) Truitt Merle, MD as Consulting Physician (Hematology)  Date of Service:  12/31/2021  CHIEF COMPLAINT: f/u of pancytopenia/MDS  CURRENT THERAPY:  Vidaza, given days 1-5 every 28 days, started 12/04/21 Blood: 1 unit of PRBC if hemoglobin 7.0-8.0, 2 units if hemoglobin less than 7.0 Platelet transfusion if platelet less than 15 K For blood products will be irradiated  ASSESSMENT & PLAN:  Eugene Cooper is a 81 y.o. male with   1. 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 He was found to have pancytopenia with severe anemia Hgb 6.4 on admission. Further work up ruled out nutritional anemia (O75, folic acid, iron) and hemolysis -EGD showed erosive gastritis, currently asymptomatic on PPI -imaging was negative for lymphadenopathy or solid organ malignancy, it did show findings c/w pneumonia, he was treated with broad spectrum antibiotics, he is followed by ID but work up was essentially negative -He underwent bone marrow biopsy 11/08/21, which shows hypercellular marrow with increased blasts up to 10-15%, consistent with myelodysplastic syndrome (MDS) -His cytogenetics showed complex genetic changes (>3) including 5q-. NGS showed CALR and TP53 mutations, not targetable mutations  -based on his IPSS-R, he has very high risk to develop AML, median survival is 0.8 year in this group. He is not a candidate for intensive chemotherapy or bone marrow transplant if he develops AML -He started Vidaza injections on 12/04/21. He tolerated well with no noticeable side effects. -labs reviewed, his plt count has continued to decrease and is now down to 8k today. We will hold Vidaza today and proceed  with 1u plt and 1u pRBC. -will postpone cycle 2 Vidaza to next week  -plan to repeat BM biopsy after 3 cycles chemo    2. Pancytopenia -he was admitted on 12/05/21 with symptomatic anemia, syncopal episode. He received 2 units pRBCs during admission. -hgb 7.5 today (12/31/21), will proceed with transfusion.    3. Goal of care discussion  -We discussed the overall poor prognosis, especially if he does not have good response to treatment. -The patient understands the goal of care is palliative.   4. Deconditioning, weight loss  -He lost approx 40 lbs in 6 months (since 05/2021) -He is not eating much, per family. He requires assistance with ADLs and walker at home. Reviewed fall precautions.  -endorses taking 2 Ensure a day. -referred to dietician    5. Gout and knee pain  -refill of colchicine 0.6 mg given 12/11/21.   -He will follow-up with his primary care physician this afternoon to discuss knee pain  6. CVA, HL, HTN, DM, LBBB -BG 150 - 210 at home -continue med regimen and f/up per PCP, cards, and care team      PLAN: -proceed with 1u each of pRBC and platelets, hold [place] appointments to next week 1/30-2/3 -Lab and 1u blood and 1u plt transfusion every Monday and Thursday for next 4 weeks starting this week -f/u in 1 and 3 weeks    No problem-specific Assessment & Plan notes found for this encounter.   SUMMARY OF ONCOLOGIC HISTORY: Oncology History  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[place])  11/09/2021 Initial Diagnosis   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[place])   12/04/2021 -  Chemotherapy   Patient  is on Treatment Plan : MYELODYSPLASIA  Azacitidine SQ D1-5 q28d        INTERVAL HISTORY:  Eugene Cooper is here for a follow up of pancytopenia. He was last seen by me on 12/18/21. He presents to the clinic accompanied by his daughter. [employer] interpreter was used. He reports pain to his knees, ankles, and feet. They report he has been given shots, which  previously relieved the pain for about a month but now didn't last even a week. He has tried tylenol, but it isn't effective. He notes he has been prescribed pain medication by his PCP, whom he will see this afternoon.   All other systems were reviewed with the patient and are negative.  MEDICAL HISTORY:  Past Medical History:  Diagnosis Date   CKD (chronic kidney disease), stage III ([place])    Diabetes ([place])    Hyperlipemia    Hypertension    LBBB (left bundle branch block)    Stroke [facility])     SURGICAL HISTORY: Past Surgical History:  Procedure Laterality Date   BIOPSY  10/29/2021   Procedure: BIOPSY;  Surgeon: Thornton Park, MD;  Location: [facility];  Service: Gastroenterology;;   ESOPHAGOGASTRODUODENOSCOPY (EGD) WITH PROPOFOL N/A 10/29/2021   Procedure: ESOPHAGOGASTRODUODENOSCOPY (EGD) WITH PROPOFOL;  Surgeon: Thornton Park, MD;  Location: [place];  Service: Gastroenterology;  Laterality: N/A;    I have reviewed the social history and family history with the patient and they are unchanged from previous note.  ALLERGIES:  has No Known Allergies.  MEDICATIONS:  Current Outpatient Medications  Medication Sig Dispense Refill   Accu-Chek Softclix Lancets lancets USE UPTO FOUR TIMES DAILY AS DIRECTED     acetaminophen (TYLENOL) 325 MG tablet Take 1-2 tablets (325-650 mg total) by mouth every 4 (four) hours as needed for mild pain.     allopurinol (ZYLOPRIM) 300 MG tablet Take 1 tablet (300 mg total) by mouth daily. 30 tablet 5   atorvastatin (LIPITOR) 40 MG tablet Take 1 tablet (40 mg total) by mouth daily at 6 PM. 30 tablet 0   blood glucose meter kit and supplies KIT Dispense based on patient and insurance preference. Use up to four times daily as directed. (FOR ICD-9 250.00, 250.01). 1 each 0   colchicine 0.6 MG tablet Take 1 tablet (0.6 mg total) by mouth daily. 30 tablet 0   diclofenac Sodium (VOLTAREN) 1 % GEL Apply 2 g topically 4 (four) times daily. (Patient  taking differently: Apply 2 g topically 4 (four) times daily as needed (knee/ankle pain).) 150 g 1   HYDROcodone-acetaminophen (NORCO/VICODIN) 5-325 MG tablet Take 0.5-1 tablets by mouth 2 (two) times daily as needed for pain.     magnesium oxide (MAG-OX) 400 (240 Mg) MG tablet Take 0.5 tablets (200 mg total) by mouth 2 (two) times daily. 10 tablet 0   metFORMIN (GLUCOPHAGE) 1000 MG tablet Take 1,000 mg by mouth 2 (two) times daily.     ondansetron (ZOFRAN) 8 MG tablet Take 1 tablet (8 mg total) by mouth 2 (two) times daily as needed (Nausea or vomiting). 30 tablet 1   pantoprazole (PROTONIX) 40 MG tablet Take 1 tablet (40 mg total) by mouth 2 (two) times daily. 30 tablet 1   No current facility-administered medications for this visit.    PHYSICAL EXAMINATION: ECOG PERFORMANCE STATUS: 3 - Symptomatic, >50% confined to bed  Vitals:   12/31/21 0910  BP: 118/75  Pulse: (!) 122  Resp: 16  Temp: 98.5 F (36.9 C)  SpO2:  100%   Wt Readings from Last 3 Encounters:  12/31/21 123 lb (55.8 kg)  12/18/21 123 lb 6.4 oz (56 kg)  12/06/21 133 lb 9.6 oz (60.6 kg)     GENERAL:alert, no distress and comfortable SKIN: skin color, texture, turgor are normal, no rashes or significant lesions EYES: normal, Conjunctiva are pink and non-injected, sclera clear  LUNGS: clear to auscultation and percussion with normal breathing effort HEART: regular rate & rhythm and no murmurs and no lower extremity edema NEURO: alert & oriented x 3 with fluent speech, no focal motor/sensory deficits  LABORATORY DATA:  I have reviewed the data as listed CBC Latest Ref Rng & Units 12/31/2021 12/28/2021 12/25/2021  WBC 4.0 - 10.5 K/uL 1.5(L) 2.1(L) 2.4(L)  Hemoglobin 13.0 - 17.0 g/dL 7.5(L) 9.3(L) 7.3(L)  Hematocrit 39.0 - 52.0 % 21.3(L) 26.1(L) 20.6(L)  Platelets 150 - 400 K/uL 8(LL) 12(L) 16(L)     CMP Latest Ref Rng & Units 12/31/2021 12/21/2021 12/18/2021  Glucose 70 - 99 mg/dL 288(H) 180(H) 207(H)  BUN 8 - 23 mg/dL  15 29(H) 29(H)  Creatinine 0.61 - 1.24 mg/dL 0.79 0.83 0.77  Sodium 135 - 145 mmol/L 132(L) 135 134(L)  Potassium 3.5 - 5.1 mmol/L 3.9 4.4 4.6  Chloride 98 - 111 mmol/L 98 101 100  CO2 22 - 32 mmol/L 23 22 23   Calcium 8.9 - 10.3 mg/dL 9.0 8.8(L) 9.2  Total Protein 6.5 - 8.1 g/dL 6.0(L) 6.0(L) 6.0(L)  Total Bilirubin 0.3 - 1.2 mg/dL 1.0 0.9 0.6  Alkaline Phos 38 - 126 U/L 93 100 101  AST 15 - 41 U/L 9(L) 18 19  ALT 0 - 44 U/L 10 30 45(H)      RADIOGRAPHIC STUDIES: I have personally reviewed the radiological images as listed and agreed with the findings in the report. DG Knee Complete 4 Views Left  Result Date: 12/31/2021 CLINICAL DATA:  Left knee pain. EXAM: LEFT KNEE - COMPLETE 4+ VIEW COMPARISON:  None FINDINGS: There is no acute fracture or dislocation. Mild osteopenia. Moderate arthritic changes with tricompartmental narrowing. No joint effusion. The soft tissues are unremarkable. IMPRESSION: 1. No acute fracture or dislocation. 2. Moderate arthritic changes. Electronically Signed   By: Anner Crete M.D.   On: 12/31/2021 21:12   DG Knee Complete 4 Views Right  Result Date: 12/31/2021 CLINICAL DATA:  Redness and swelling at both knees. Possible septic arthropathy. EXAM: RIGHT KNEE - COMPLETE 4+ VIEW COMPARISON:  AP Lat right knee 11/01/2021. FINDINGS: There is mild circumferential soft tissue fullness. There is a minimal suprapatellar bursal effusion on lateral view There is mild osteopenia. No fracture is evident. There are mild features of nonerosive tricompartmental degenerative arthrosis consisting of mild femorotibial and patellofemoral joint space loss and small tricompartmental marginal spurs. There is enthesopathic spurring of the anterior superior patella which was also seen previously. There is a fabella posterolaterally. Early degenerative arthrosis of the proximal tibiofibular joint. IMPRESSION: 1. Mild generalized soft tissue swelling. 2. Minimal suprapatellar bursal  effusion. 3. 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 4. Only noteworthy interval change since 11/01/2021 is the superficial soft tissue prominence. Electronically Signed   By: Telford Nab M.D.   On: 12/31/2021 21:16      No orders of the defined types were placed in this encounter.  All questions were answered. The patient knows to call the clinic with any problems, questions or concerns. No barriers to learning was detected. The total time spent in the appointment was 30 minutes.  Truitt Merle, MD 12/31/2021   I, Wilburn Mylar, am acting as scribe for Truitt Merle, MD.   I have reviewed the above documentation for accuracy and completeness, and I agree with the above.

## 2021-12-31 NOTE — ED Triage Notes (Signed)
Directed to ER from UC for possible ultrasound guided aspiration of right knee and possible admit for septic knee per UC documents. Pt family reports bilateral knee pain and redness for several months. Denies any injury.

## 2022-01-01 ENCOUNTER — Ambulatory Visit: Payer: Medicare Other

## 2022-01-01 ENCOUNTER — Inpatient Hospital Stay: Payer: Medicare Other

## 2022-01-01 ENCOUNTER — Inpatient Hospital Stay (HOSPITAL_COMMUNITY): Payer: Medicare Other

## 2022-01-01 DIAGNOSIS — R509 Fever, unspecified: Secondary | ICD-10-CM | POA: Diagnosis present

## 2022-01-01 DIAGNOSIS — D469 Myelodysplastic syndrome, unspecified: Secondary | ICD-10-CM | POA: Diagnosis present

## 2022-01-01 DIAGNOSIS — Z823 Family history of stroke: Secondary | ICD-10-CM | POA: Diagnosis not present

## 2022-01-01 DIAGNOSIS — Z8673 Personal history of transient ischemic attack (TIA), and cerebral infarction without residual deficits: Secondary | ICD-10-CM | POA: Diagnosis not present

## 2022-01-01 DIAGNOSIS — D61818 Other pancytopenia: Secondary | ICD-10-CM | POA: Diagnosis present

## 2022-01-01 DIAGNOSIS — Z833 Family history of diabetes mellitus: Secondary | ICD-10-CM | POA: Diagnosis not present

## 2022-01-01 DIAGNOSIS — Z79899 Other long term (current) drug therapy: Secondary | ICD-10-CM | POA: Diagnosis not present

## 2022-01-01 DIAGNOSIS — K219 Gastro-esophageal reflux disease without esophagitis: Secondary | ICD-10-CM | POA: Diagnosis present

## 2022-01-01 DIAGNOSIS — R6510 Systemic inflammatory response syndrome (SIRS) of non-infectious origin without acute organ dysfunction: Secondary | ICD-10-CM | POA: Diagnosis present

## 2022-01-01 DIAGNOSIS — M25462 Effusion, left knee: Secondary | ICD-10-CM | POA: Diagnosis not present

## 2022-01-01 DIAGNOSIS — M25461 Effusion, right knee: Secondary | ICD-10-CM | POA: Diagnosis present

## 2022-01-01 DIAGNOSIS — I10 Essential (primary) hypertension: Secondary | ICD-10-CM | POA: Diagnosis present

## 2022-01-01 DIAGNOSIS — M109 Gout, unspecified: Secondary | ICD-10-CM | POA: Diagnosis present

## 2022-01-01 DIAGNOSIS — E1165 Type 2 diabetes mellitus with hyperglycemia: Secondary | ICD-10-CM | POA: Diagnosis present

## 2022-01-01 DIAGNOSIS — M25562 Pain in left knee: Secondary | ICD-10-CM | POA: Diagnosis present

## 2022-01-01 DIAGNOSIS — R2689 Other abnormalities of gait and mobility: Secondary | ICD-10-CM | POA: Diagnosis present

## 2022-01-01 DIAGNOSIS — Z20822 Contact with and (suspected) exposure to covid-19: Secondary | ICD-10-CM | POA: Diagnosis present

## 2022-01-01 DIAGNOSIS — D708 Other neutropenia: Secondary | ICD-10-CM | POA: Diagnosis not present

## 2022-01-01 DIAGNOSIS — M25061 Hemarthrosis, right knee: Secondary | ICD-10-CM | POA: Diagnosis present

## 2022-01-01 DIAGNOSIS — E785 Hyperlipidemia, unspecified: Secondary | ICD-10-CM | POA: Diagnosis present

## 2022-01-01 DIAGNOSIS — Z8249 Family history of ischemic heart disease and other diseases of the circulatory system: Secondary | ICD-10-CM | POA: Diagnosis not present

## 2022-01-01 DIAGNOSIS — E440 Moderate protein-calorie malnutrition: Secondary | ICD-10-CM | POA: Diagnosis present

## 2022-01-01 HISTORY — PX: IR US GUIDE BX ASP/DRAIN: IMG2392

## 2022-01-01 LAB — PREPARE PLATELET PHERESIS: Unit division: 0

## 2022-01-01 LAB — TYPE AND SCREEN
ABO/RH(D): O POS
ABO/RH(D): O POS
Antibody Screen: NEGATIVE
Antibody Screen: NEGATIVE
Unit division: 0

## 2022-01-01 LAB — RESP PANEL BY RT-PCR (FLU A&B, COVID) ARPGX2
Influenza A by PCR: NEGATIVE
Influenza B by PCR: NEGATIVE
SARS Coronavirus 2 by RT PCR: NEGATIVE

## 2022-01-01 LAB — COMPREHENSIVE METABOLIC PANEL
ALT: 16 U/L (ref 0–44)
AST: 16 U/L (ref 15–41)
Albumin: 3 g/dL — ABNORMAL LOW (ref 3.5–5.0)
Alkaline Phosphatase: 92 U/L (ref 38–126)
Anion gap: 9 (ref 5–15)
BUN: 21 mg/dL (ref 8–23)
CO2: 26 mmol/L (ref 22–32)
Calcium: 8.3 mg/dL — ABNORMAL LOW (ref 8.9–10.3)
Chloride: 98 mmol/L (ref 98–111)
Creatinine, Ser: 0.66 mg/dL (ref 0.61–1.24)
GFR, Estimated: >60 mL/min (ref >60)
Glucose, Bld: 231 mg/dL — ABNORMAL HIGH (ref 70–99)
Potassium: 3.7 mmol/L (ref 3.5–5.1)
Sodium: 133 mmol/L — ABNORMAL LOW (ref 135–145)
Total Bilirubin: 0.9 mg/dL (ref 0.3–1.2)
Total Protein: 5.8 g/dL — ABNORMAL LOW (ref 6.5–8.1)

## 2022-01-01 LAB — SYNOVIAL CELL COUNT + DIFF, W/ CRYSTALS
Crystals, Fluid: NONE SEEN
Lymphocytes-Synovial Fld: 32 % — ABNORMAL HIGH (ref 0–20)
Monocyte-Macrophage-Synovial Fluid: 8 % — ABNORMAL LOW (ref 50–90)
Neutrophil, Synovial: 60 % — ABNORMAL HIGH (ref 0–25)
WBC, Synovial: 11 /mm3 (ref 0–200)

## 2022-01-01 LAB — BPAM PLATELET PHERESIS
Blood Product Expiration Date: 202301232359
ISSUE DATE / TIME: 202301230954
Unit Type and Rh: 6200

## 2022-01-01 LAB — BPAM RBC
Blood Product Expiration Date: 202302212359
ISSUE DATE / TIME: 202301231043
Unit Type and Rh: 5100

## 2022-01-01 LAB — CBG MONITORING, ED
Glucose-Capillary: 195 mg/dL — ABNORMAL HIGH (ref 70–99)
Glucose-Capillary: 208 mg/dL — ABNORMAL HIGH (ref 70–99)

## 2022-01-01 LAB — PROTIME-INR
INR: 1.2 (ref 0.8–1.2)
Prothrombin Time: 14.7 seconds (ref 11.4–15.2)

## 2022-01-01 LAB — GLUCOSE, CAPILLARY: Glucose-Capillary: 126 mg/dL — ABNORMAL HIGH (ref 70–99)

## 2022-01-01 LAB — C-REACTIVE PROTEIN: CRP: 6.8 mg/dL — ABNORMAL HIGH (ref <1.0)

## 2022-01-01 MED ORDER — INSULIN ASPART 100 UNIT/ML IJ SOLN
0.0000 [IU] | Freq: Every day | INTRAMUSCULAR | Status: DC
  Administered 2022-01-02: 21:00:00 2 [IU] via SUBCUTANEOUS
  Administered 2022-01-03: 3 [IU] via SUBCUTANEOUS
  Filled 2022-01-01: qty 0.05

## 2022-01-01 MED ORDER — INSULIN ASPART 100 UNIT/ML IJ SOLN
0.0000 [IU] | Freq: Three times a day (TID) | INTRAMUSCULAR | Status: DC
  Administered 2022-01-01: 17:00:00 3 [IU] via SUBCUTANEOUS
  Administered 2022-01-01 – 2022-01-02 (×4): 2 [IU] via SUBCUTANEOUS
  Administered 2022-01-03 (×2): 3 [IU] via SUBCUTANEOUS
  Administered 2022-01-03: 2 [IU] via SUBCUTANEOUS
  Administered 2022-01-04: 5 [IU] via SUBCUTANEOUS
  Administered 2022-01-04: 3 [IU] via SUBCUTANEOUS
  Administered 2022-01-04 – 2022-01-05 (×2): 2 [IU] via SUBCUTANEOUS
  Administered 2022-01-05: 3 [IU] via SUBCUTANEOUS
  Filled 2022-01-01: qty 0.09

## 2022-01-01 MED ORDER — PANTOPRAZOLE SODIUM 40 MG PO TBEC
40.0000 mg | DELAYED_RELEASE_TABLET | Freq: Two times a day (BID) | ORAL | Status: DC
  Administered 2022-01-01 – 2022-01-05 (×9): 40 mg via ORAL
  Filled 2022-01-01 (×9): qty 1

## 2022-01-01 MED ORDER — ACETAMINOPHEN 650 MG RE SUPP: 650.0000 mg | Freq: Four times a day (QID) | RECTAL | Status: DC | PRN

## 2022-01-01 MED ORDER — VANCOMYCIN HCL IN DEXTROSE 1-5 GM/200ML-% IV SOLN
1000.0000 mg | INTRAVENOUS | Status: DC
  Administered 2022-01-02 – 2022-01-04 (×3): 1000 mg via INTRAVENOUS
  Filled 2022-01-01 (×3): qty 200

## 2022-01-01 MED ORDER — ORAL CARE MOUTH RINSE
15.0000 mL | Freq: Two times a day (BID) | OROMUCOSAL | Status: DC
  Administered 2022-01-01 – 2022-01-05 (×7): 15 mL via OROMUCOSAL

## 2022-01-01 MED ORDER — ACETAMINOPHEN 325 MG PO TABS
650.0000 mg | ORAL_TABLET | Freq: Four times a day (QID) | ORAL | Status: DC | PRN
  Administered 2022-01-02 – 2022-01-03 (×5): 650 mg via ORAL
  Filled 2022-01-01 (×5): qty 2

## 2022-01-01 MED ORDER — OXYCODONE HCL 5 MG PO TABS
5.0000 mg | ORAL_TABLET | ORAL | Status: DC | PRN
  Administered 2022-01-01 – 2022-01-03 (×6): 5 mg via ORAL
  Filled 2022-01-01 (×6): qty 1

## 2022-01-01 MED ORDER — LIDOCAINE HCL 1 % IJ SOLN
INTRAMUSCULAR | Status: AC
  Filled 2022-01-01: qty 20

## 2022-01-01 MED ORDER — VANCOMYCIN HCL IN DEXTROSE 1-5 GM/200ML-% IV SOLN
1000.0000 mg | Freq: Once | INTRAVENOUS | Status: AC
  Administered 2022-01-01: 07:00:00 1000 mg via INTRAVENOUS
  Filled 2022-01-01: qty 200

## 2022-01-01 MED ORDER — ATORVASTATIN CALCIUM 40 MG PO TABS
40.0000 mg | ORAL_TABLET | Freq: Every day | ORAL | Status: DC
  Administered 2022-01-01 – 2022-01-04 (×4): 40 mg via ORAL
  Filled 2022-01-01 (×4): qty 1

## 2022-01-01 MED ORDER — SODIUM CHLORIDE 0.9% IV SOLUTION: Freq: Once | INTRAVENOUS | Status: AC

## 2022-01-01 MED ORDER — ALLOPURINOL 300 MG PO TABS
300.0000 mg | ORAL_TABLET | Freq: Every day | ORAL | Status: DC
  Administered 2022-01-01 – 2022-01-05 (×5): 300 mg via ORAL
  Filled 2022-01-01 (×6): qty 1

## 2022-01-01 NOTE — H&P (Signed)
History and Physical    Arturo Sofranko ZSW:[id number] DOB: 06/10/1941 DOA: 12/31/2021  PCP: Emelia Loron, NP  Patient coming from: Home  Chief Complaint: b/l knee pain  HPI: Eugene Cooper is a 81 y.o. male with medical history significant of HLD, myelodysplastic syndrome, gout, pancytopenia, HTN, DM2. Presenting with b/l knee pain. His knees have been bothering him for a month. He is usually able to get around with a walker, but he's now having difficulty with tat. His pain is a pinching character that is constant. He's seen his PCP for injections, but they only help temporarily. He tried to have his joint aspirated yesterday, but they were unable to get anything. He came to ED for further eval. He denies any other aggravating or alleviating factors.   ED Course: XR of right knee was notable for soft tissue swelling. Ortho was consulted. He was started on vanc. [facility] was called for admission.    Review of Systems:  Denies CP, palpitations, dyspnea, abdominal pain, N/V/D, fevers, sick contacts. Review of systems is otherwise negative for all not mentioned in HPI.   PMHx Past Medical History:  Diagnosis Date   CKD (chronic kidney disease), stage III ([place])    Diabetes ([place])    Hyperlipemia    Hypertension    LBBB (left bundle branch block)    Stroke [facility])     PSHx Past Surgical History:  Procedure Laterality Date   BIOPSY  10/29/2021   Procedure: BIOPSY;  Surgeon: Thornton Park, MD;  Location: [facility];  Service: Gastroenterology;;   ESOPHAGOGASTRODUODENOSCOPY (EGD) WITH PROPOFOL N/A 10/29/2021   Procedure: ESOPHAGOGASTRODUODENOSCOPY (EGD) WITH PROPOFOL;  Surgeon: Thornton Park, MD;  Location: [place];  Service: Gastroenterology;  Laterality: N/A;    SocHx  reports that he has never smoked. He has never used smokeless tobacco. He reports that he does not currently use alcohol. He reports that he does not use drugs.  No Known Allergies  FamHx Family  History  Problem Relation Age of Onset   Stroke Mother    Lung disease Father    Diabetes Sister    Heart disease Sister    Hyperlipidemia Son    Hyperlipidemia Daughter    Hypertension Daughter     Prior to Admission medications   Medication Sig Start Date End Date Taking? Authorizing Provider  acetaminophen (TYLENOL) 325 MG tablet Take 1-2 tablets (325-650 mg total) by mouth every 4 (four) hours as needed for mild pain. 01/07/20  Yes Love, Ivan Anchors, PA-C  allopurinol (ZYLOPRIM) 300 MG tablet Take 1 tablet (300 mg total) by mouth daily. 12/12/21  Yes Rice, Resa Miner, MD  diclofenac Sodium (VOLTAREN) 1 % GEL Apply 2 g topically 4 (four) times daily. Patient taking differently: Apply 2 g topically 4 (four) times daily as needed (knee/ankle pain). 11/10/21  Yes Regalado, Belkys A, MD  magnesium oxide (MAG-OX) 400 (240 Mg) MG tablet Take 0.5 tablets (200 mg total) by mouth 2 (two) times daily. 11/10/21  Yes Regalado, Belkys A, MD  metFORMIN (GLUCOPHAGE) 1000 MG tablet Take 1,000 mg by mouth 2 (two) times daily. 02/29/20  Yes [provider]  pantoprazole (PROTONIX) 40 MG tablet Take 1 tablet (40 mg total) by mouth 2 (two) times daily. 11/10/21 11/10/22 Yes Regalado, Belkys A, MD  Accu-Chek Softclix Lancets lancets USE UPTO FOUR TIMES DAILY AS DIRECTED 01/08/20   [provider]  atorvastatin (LIPITOR) 40 MG tablet Take 1 tablet (40 mg total) by mouth daily at 6 PM. 01/07/20  Love, Pamela S, PA-C  blood glucose meter kit and supplies KIT Dispense based on patient and insurance preference. Use up to four times daily as directed. (FOR ICD-9 250.00, 250.01). 01/07/20   Love, Ivan Anchors, PA-C  ondansetron (ZOFRAN) 8 MG tablet Take 1 tablet (8 mg total) by mouth 2 (two) times daily as needed (Nausea or vomiting). Patient not taking: Reported on 01/01/2022 11/27/21   Truitt Merle, MD    Physical Exam: Vitals:   12/31/21 1942 01/01/22 0325 01/01/22 0430 01/01/22 0542  BP:  117/79 (!) 143/70  (!) 146/79  Pulse:  (!) 102 92 87  Resp:  16 16 17   Temp:  98.8 F (37.1 C)    TempSrc:  Oral    SpO2:  99% 100% 100%  Weight: 55.8 kg     Height: 5' 2"  (1.575 m)       General: 81 y.o. male resting in bed in NAD Eyes: PERRL, normal sclera ENMT: Nares patent w/o discharge, orophaynx clear, dentition normal, ears w/o discharge/lesions/ulcers Neck: Supple, trachea midline Cardiovascular: RRR, +S1, S2, no m/g/r, equal pulses throughout Respiratory: CTABL, no w/r/r, normal WOB GI: BS+, NDNT, no masses noted, no organomegaly noted MSK: No c/c; b/l knee swelling/pain, limited ROM b/l knee Neuro: A&O x 3, no focal deficits Psyc: Appropriate interaction and affect, calm/cooperative  Labs on Admission: I have personally reviewed following labs and imaging studies  CBC: Recent Labs  Lab 12/25/21 0938 12/28/21 0931 12/31/21 0822 01/01/22 0516  WBC 2.4* 2.1* 1.5* 1.8*  NEUTROABS 0.4* 0.2* 0.7* 0.6*  HGB 7.3* 9.3* 7.5* 9.4*  HCT 20.6* 26.1* 21.3* 26.8*  MCV 86.2 84.5 84.9 86.5  PLT 16* 12* 8* 13*   Basic Metabolic Panel: Recent Labs  Lab 12/31/21 0825  NA 132*  K 3.9  CL 98  CO2 23  GLUCOSE 288*  BUN 15  CREATININE 0.79  CALCIUM 9.0   GFR: Estimated Creatinine Clearance: 56.9 mL/min (by C-G formula based on SCr of 0.79 mg/dL). Liver Function Tests: Recent Labs  Lab 12/31/21 0825  AST 9*  ALT 10  ALKPHOS 93  BILITOT 1.0  PROT 6.0*  ALBUMIN 3.5   No results for input(s): LIPASE, AMYLASE in the last 168 hours. No results for input(s): AMMONIA in the last 168 hours. Coagulation Profile: Recent Labs  Lab 01/01/22 0516  INR 1.2   Cardiac Enzymes: No results for input(s): CKTOTAL, CKMB, CKMBINDEX, TROPONINI in the last 168 hours. BNP (last 3 results) No results for input(s): PROBNP in the last 8760 hours. HbA1C: No results for input(s): HGBA1C in the last 72 hours. CBG: No results for input(s): GLUCAP in the last 168 hours. Lipid Profile: No results for  input(s): CHOL, HDL, LDLCALC, TRIG, CHOLHDL, LDLDIRECT in the last 72 hours. Thyroid Function Tests: No results for input(s): TSH, T4TOTAL, FREET4, T3FREE, THYROIDAB in the last 72 hours. Anemia Panel: No results for input(s): VITAMINB12, FOLATE, FERRITIN, TIBC, IRON, RETICCTPCT in the last 72 hours. Urine analysis:    Component Value Date/Time   COLORURINE YELLOW 12/27/2021 [place] 12/27/2021 1608   LABSPEC 1.023 12/27/2021 1608   PHURINE 5.0 12/27/2021 1608   GLUCOSEU NEGATIVE 12/27/2021 1608   HGBUR NEGATIVE 12/27/2021 1608   BILIRUBINUR NEGATIVE 12/27/2021 1608   KETONESUR NEGATIVE 12/27/2021 1608   PROTEINUR NEGATIVE 12/27/2021 1608   NITRITE NEGATIVE 12/27/2021 1608   LEUKOCYTESUR NEGATIVE 12/27/2021 1608    Radiological Exams on Admission: DG Knee Complete 4 Views Left  Result Date: 12/31/2021 CLINICAL  DATA:  Left knee pain. EXAM: LEFT KNEE - COMPLETE 4+ VIEW COMPARISON:  None FINDINGS: There is no acute fracture or dislocation. Mild osteopenia. Moderate arthritic changes with tricompartmental narrowing. No joint effusion. The soft tissues are unremarkable. IMPRESSION: 1. No acute fracture or dislocation. 2. Moderate arthritic changes. Electronically Signed   By: Anner Crete M.D.   On: 12/31/2021 21:12   DG Knee Complete 4 Views Right  Result Date: 12/31/2021 CLINICAL DATA:  Redness and swelling at both knees. Possible septic arthropathy. EXAM: RIGHT KNEE - COMPLETE 4+ VIEW COMPARISON:  AP Lat right knee 11/01/2021. FINDINGS: There is mild circumferential soft tissue fullness. There is a minimal suprapatellar bursal effusion on lateral view There is mild osteopenia. No fracture is evident. There are mild features of nonerosive tricompartmental degenerative arthrosis consisting of mild femorotibial and patellofemoral joint space loss and small tricompartmental marginal spurs. There is enthesopathic spurring of the anterior superior patella which was also seen  previously. There is a fabella posterolaterally. Early degenerative arthrosis of the proximal tibiofibular joint. IMPRESSION: 1. Mild generalized soft tissue swelling. 2. Minimal suprapatellar bursal effusion. 3. 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 4. Only noteworthy interval change since 11/01/2021 is the superficial soft tissue prominence. Electronically Signed   By: Telford Nab M.D.   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 Burr Medico; give 1 unit plts prior to joint aspiration; now ordered  DM2     - SSI, DM diet, glucose checks  HTN     - continue home regimen  HLD     - continue statin  GERD     - PPI  DVT prophylaxis: SCDs  Code Status: FULL  Family Communication: w/ son at bedside  Consults called: Ortho ([facility]), IR   Status is: Inpatient  Remains inpatient appropriate because: severity of illness  Jonnie Finner DO [facility]  If 7PM-7AM, please contact night-coverage [email or web address]  01/01/2022, 7:19 AM

## 2022-01-01 NOTE — ED Provider Notes (Signed)
[place] DEPT Provider Note   CSN: [id number] Arrival date & time: 12/31/21  1931     History  Chief Complaint  Patient presents with   Knee Pain    Eugene Cooper is a 81 y.o. male.  The history is provided by the patient and a relative. The history is limited by a language barrier. A language interpreter was used (spanish Shirlee Limerick [phone]).  Knee Pain Time since incident:  3 weeks Pain details:    Severity:  Moderate   Onset quality:  Gradual   Timing:  Constant   Progression:  Worsening Chronicity:  New Prior injury to area:  No Relieved by:  Nothing Associated symptoms: no fever   Pt reports "injection" several weeks ago and pain started soon after  Patient has history of myelodysplastic syndrome. Is no history of pancytopenia. Daughter reports that yesterday he was seen by his oncologist and given a transfusion.  He then ended up at an orthopedic urgent care due to knee pain these had said for several weeks.  They attempted to do arthrocentesis that failed.  He was sent here for further evaluation      Past Medical History:  Diagnosis Date   CKD (chronic kidney disease), stage III (HCC)    Diabetes ([place])    Hyperlipemia    Hypertension    LBBB (left bundle branch block)    Stroke ([place])     Home Medications Prior to Admission medications   Medication Sig Start Date End Date Taking? Authorizing Provider  acetaminophen (TYLENOL) 325 MG tablet Take 1-2 tablets (325-650 mg total) by mouth every 4 (four) hours as needed for mild pain. 01/07/20  Yes Love, Ivan Anchors, PA-C  allopurinol (ZYLOPRIM) 300 MG tablet Take 1 tablet (300 mg total) by mouth daily. 12/12/21  Yes Rice, Resa Miner, MD  diclofenac Sodium (VOLTAREN) 1 % GEL Apply 2 g topically 4 (four) times daily. Patient taking differently: Apply 2 g topically 4 (four) times daily as needed (knee/ankle pain). 11/10/21  Yes Regalado, Belkys A, MD  magnesium oxide (MAG-OX) 400  (240 Mg) MG tablet Take 0.5 tablets (200 mg total) by mouth 2 (two) times daily. 11/10/21  Yes Regalado, Belkys A, MD  metFORMIN (GLUCOPHAGE) 1000 MG tablet Take 1,000 mg by mouth 2 (two) times daily. 02/29/20  Yes [provider]  pantoprazole (PROTONIX) 40 MG tablet Take 1 tablet (40 mg total) by mouth 2 (two) times daily. 11/10/21 11/10/22 Yes Regalado, Belkys A, MD  Accu-Chek Softclix Lancets lancets USE UPTO FOUR TIMES DAILY AS DIRECTED 01/08/20   [provider]  atorvastatin (LIPITOR) 40 MG tablet Take 1 tablet (40 mg total) by mouth daily at 6 PM. 01/07/20   Love, Ivan Anchors, PA-C  blood glucose meter kit and supplies KIT Dispense based on patient and insurance preference. Use up to four times daily as directed. (FOR ICD-9 250.00, 250.01). 01/07/20   Love, Ivan Anchors, PA-C  ondansetron (ZOFRAN) 8 MG tablet Take 1 tablet (8 mg total) by mouth 2 (two) times daily as needed (Nausea or vomiting). Patient not taking: Reported on 01/01/2022 11/27/21   Truitt Merle, MD      Allergies    Patient has no known allergies.    Review of Systems   Review of Systems  Constitutional:  Negative for chills and fever.  Gastrointestinal:  Negative for vomiting.  Musculoskeletal:  Positive for arthralgias and joint swelling.  Neurological:  Positive for weakness.   Physical Exam Updated  Vital Signs BP (!) 146/79    Pulse 87    Temp 98.8 F (37.1 C) (Oral)    Resp 17    Ht 1.575 m (5' 2" )    Wt 55.8 kg    SpO2 100%    BMI 22.50 kg/m  Physical Exam CONSTITUTIONAL: Elderly and chronically ill-appearing HEAD: Normocephalic/atraumatic EYES: EOMI ENMT: Mucous membranes moist NECK: supple no meningeal signs SPINE/BACK:entire spine nontender CV: S1/S2 noted LUNGS: Lungs are clear to auscultation bilaterally ABDOMEN: soft, nontender NEURO: Pt is awake/alert/appropriate, moves all extremitiesx4.    EXTREMITIES: Distal pulses equal and intact.  He has tenderness with range of motion of both lower  extremities.  See photo below. SKIN: warm, color normal, see photo PSYCH: no abnormalities of mood noted, alert and oriented to situation       ED Results / Procedures / Treatments   Labs (all labs ordered are listed, but only abnormal results are displayed) Labs Reviewed  SEDIMENTATION RATE - Abnormal; Notable for the following components:      Result Value   Sed Rate 58 (*)    All other components within normal limits  C-REACTIVE PROTEIN - Abnormal; Notable for the following components:   CRP 6.8 (*)    All other components within normal limits  CBC WITH DIFFERENTIAL/PLATELET - Abnormal; Notable for the following components:   WBC 1.8 (*)    RBC 3.10 (*)    Hemoglobin 9.4 (*)    HCT 26.8 (*)    Platelets 13 (*)    Neutro Abs 0.6 (*)    Monocytes Absolute 0.0 (*)    All other components within normal limits  RESP PANEL BY RT-PCR (FLU A&B, COVID) [id number]  CULTURE, BLOOD (ROUTINE X 2)  CULTURE, BLOOD (ROUTINE X 2)  PROTIME-INR    EKG None  Radiology DG Knee Complete 4 Views Left  Result Date: 12/31/2021 CLINICAL DATA:  Left knee pain. EXAM: LEFT KNEE - COMPLETE 4+ VIEW COMPARISON:  None FINDINGS: There is no acute fracture or dislocation. Mild osteopenia. Moderate arthritic changes with tricompartmental narrowing. No joint effusion. The soft tissues are unremarkable. IMPRESSION: 1. No acute fracture or dislocation. 2. Moderate arthritic changes. Electronically Signed   By: Anner Crete M.D.   On: 12/31/2021 21:12   DG Knee Complete 4 Views Right  Result Date: 12/31/2021 CLINICAL DATA:  Redness and swelling at both knees. Possible septic arthropathy. EXAM: RIGHT KNEE - COMPLETE 4+ VIEW COMPARISON:  AP Lat right knee 11/01/2021. FINDINGS: There is mild circumferential soft tissue fullness. There is a minimal suprapatellar bursal effusion on lateral view There is mild osteopenia. No fracture is evident. There are mild features of nonerosive tricompartmental degenerative  arthrosis consisting of mild femorotibial and patellofemoral joint space loss and small tricompartmental marginal spurs. There is enthesopathic spurring of the anterior superior patella which was also seen previously. There is a fabella posterolaterally. Early degenerative arthrosis of the proximal tibiofibular joint. IMPRESSION: 1. Mild generalized soft tissue swelling. 2. Minimal suprapatellar bursal effusion. 3. 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 4. Only noteworthy interval change since 11/01/2021 is the superficial soft tissue prominence. Electronically Signed   By: Telford Nab M.D.   On: 12/31/2021 21:16    Procedures Procedures    Medications Ordered in ED Medications  vancomycin (VANCOCIN) IVPB 1000 mg/200 mL premix (has no administration in time range)  vancomycin (VANCOCIN) IVPB 1000 mg/200 mL premix (has no administration in time range)    ED Course/ Medical Decision Making/ A&P  Clinical Course as of 01/01/22 0643  Tue Jan 01, 2022  0637 WBC(!): 1.8 Leukopenia [DW]  3007 Platelets(!!): 13 Thrombocytopenia [DW]  6226 Discussed with PA McBane from [facility].  She reviewed the chart and let me know which orthopedic team will see him.  We will admit to hospitalist team with antibiotics until definitive orthopedic plan is in place [DW]    Clinical Course User Index [DW] Ripley Fraise, MD                           Medical Decision Making Amount and/or Complexity of Data Reviewed Labs: ordered. Decision-making details documented in ED Course.  Risk Prescription drug management. Decision regarding hospitalization.   This patient presents to the ED for concern of right knee pain and swelling, this involves an extensive number of treatment options, and is a complaint that carries with it a high risk of complications and morbidity.  The differential diagnosis includes cellulitis, septic arthritis, gout  Comorbidities that  complicate the patient evaluation: Patients presentation is complicated by their history of myelodysplastic syndrome  Social Determinants of Health: Patients English is a second language increases the complexity of managing their presentation  Additional history obtained: Additional history obtained from family Records reviewed oncology notes reviewed  Lab Tests: I Ordered, and personally interpreted labs.  The pertinent results include: Leukopenia and thrombocytopenia  Imaging Studies ordered: I ordered imaging studies including X-ray knee I independently visualized and interpreted imaging which showed small knee effusion I agree with the radiologist interpretation  Cardiac Monitoring: The patient was maintained on a cardiac monitor.  I personally viewed and interpreted the cardiac monitor which showed an underlying rhythm of:  sinus rhythm  Medicines ordered and prescription drug management: I ordered medication including vancomycin for possible cellulitis versus septic arthritis   Critical Interventions:       Antibiotics and admission  Consultations Obtained: I requested consultation with the admitting physician Hal Hope and consultant PA McBane, and discussed  findings as well as pertinent plan - they recommend: Admission  Complexity of problems addressed: Patients presentation is most consistent with  acute presentation with potential threat to life or bodily function      Disposition: After consideration of the diagnostic results and the patients response to treatment,  I feel that the patent would benefit from admission .            Final Clinical Impression(s) / ED Diagnoses Final diagnoses:  Cellulitis of right lower extremity  Effusion of bursa of right knee  Thrombocytopenia [facility])    Rx / DC Orders ED Discharge Orders     None         Ripley Fraise, MD 01/01/22 [phone]

## 2022-01-01 NOTE — ED Notes (Signed)
Pt transported to IR 

## 2022-01-01 NOTE — Progress Notes (Signed)
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  He is on palliative therapy.  I saw him yesterday in the office, chemo was held due to his cytopenias.  He presented to the ED for severe bilateral knee pain.  Son is at the bedside in the ED.  He just had a right knee suprapatellar effusion aspiration by IR this afternoon.    Objective:  Vitals:   01/01/22 1330 01/01/22 1453  BP: (!) 142/77 121/74  Pulse: 95 92  Resp:  18  Temp:  98.7 F (37.1 C)  SpO2: 100% 100%    Body mass index is 22.5 kg/m.  Intake/Output Summary (Last 24 hours) at 01/01/2022 1528 Last data filed at 01/01/2022 1455 Gross per 24 hour  Intake 924 ml  Output --  Net 924 ml     Sclerae unicteric  Oropharynx clear  No peripheral adenopathy  Lungs clear -- no rales or rhonchi  Heart regular rate and rhythm  Abdomen benign  Mild edema and skin erythema of bilateral knee  Neuro nonfocal    CBG (last 3)  Recent Labs    01/01/22 1205  GLUCAP 195*     Labs:   Urine Studies No results for input(s): UHGB, CRYS in the last 72 hours.  Invalid input(s): UACOL, UAPR, USPG, UPH, UTP, UGL, UKET, UBIL, UNIT, UROB, ULEU, UEPI, UWBC, URBC, UBAC, CAST, UCOM, BILUA  Basic Metabolic Panel: Recent Labs  Lab 12/31/21 0825 01/01/22 1059  NA 132* 133*  K 3.9 3.7  CL 98 98  CO2 23 26  GLUCOSE 288* 231*  BUN 15 21  CREATININE 0.79 0.66  CALCIUM 9.0 8.3*   GFR Estimated Creatinine Clearance: 56.9 mL/min (by C-G formula based on SCr of 0.66 mg/dL). Liver Function Tests: Recent Labs  Lab 12/31/21 0825 01/01/22 1059  AST 9* 16  ALT 10 16  ALKPHOS 93 92  BILITOT 1.0 0.9  PROT 6.0* 5.8*  ALBUMIN 3.5 3.0*   No results for input(s): LIPASE, AMYLASE in the last 168 hours. No results for input(s): AMMONIA in the last 168 hours. Coagulation profile Recent Labs  Lab 01/01/22 0516  INR 1.2     CBC: Recent Labs  Lab 12/28/21 0931 12/31/21 0822 01/01/22 0516  WBC 2.1* 1.5* 1.8*  NEUTROABS 0.2* 0.7* 0.6*  HGB 9.3* 7.5* 9.4*  HCT 26.1* 21.3* 26.8*  MCV 84.5 84.9 86.5  PLT 12* 8* 13*   Cardiac Enzymes: No results for input(s): CKTOTAL, CKMB, CKMBINDEX, TROPONINI in the last 168 hours. BNP: Invalid input(s): POCBNP CBG: Recent Labs  Lab 01/01/22 1205  GLUCAP 195*   D-Dimer No results for input(s): DDIMER in the last 72 hours. Hgb A1c No results for input(s): HGBA1C in the last 72 hours. Lipid Profile No results for input(s): CHOL, HDL, LDLCALC, TRIG, CHOLHDL, LDLDIRECT in the last 72 hours. Thyroid function studies No results for input(s): TSH, T4TOTAL, T3FREE, THYROIDAB in the last 72 hours.  Invalid input(s): FREET3 Anemia work up No results for input(s): VITAMINB12, FOLATE, FERRITIN, TIBC, IRON, RETICCTPCT in the last 72 hours. Microbiology Recent Results (from the past 240 hour(s))  Culture, Urine     Status: Abnormal   Collection Time: 12/27/21  4:08 PM   Specimen: Urine, Clean Catch  Result Value Ref Range Status   Specimen Description   Final    URINE, CLEAN CATCH Performed at  [facility], [place] [address], [place], [place] [address]    Special Requests   Final    NONE Performed at [facility], [place] [address] [place], [place] [address]    Culture (A)  Final    <10,000 COLONIES/mL INSIGNIFICANT GROWTH Performed at [place] [address], [place], [place] [address]    Report Status 12/28/2021 FINAL  Final  Resp Panel by RT-PCR (Flu A&B, Covid) Nasopharyngeal Swab     Status: None   Collection Time: 01/01/22  5:16 AM   Specimen: Nasopharyngeal Swab; Nasopharyngeal(NP) swabs in vial transport medium  Result Value Ref Range Status   SARS Coronavirus 2 by RT PCR NEGATIVE NEGATIVE Final    Comment: (NOTE) SARS-CoV-2 target nucleic acids are NOT DETECTED.  The SARS-CoV-2 RNA is  generally detectable in upper respiratory specimens during the acute phase of infection. The lowest concentration of SARS-CoV-2 viral copies this assay can detect is 138 copies/mL. A negative result does not preclude SARS-Cov-2 infection and should not be used as the sole basis for treatment or other patient management decisions. A negative result may occur with  improper specimen collection/handling, submission of specimen other than nasopharyngeal swab, presence of viral mutation(s) within the areas targeted by this assay, and inadequate number of viral copies(<138 copies/mL). A negative result must be combined with clinical observations, patient history, and epidemiological information. The expected result is Negative.  Fact Sheet for Patients:  EntrepreneurPulse.com.au  Fact Sheet for Healthcare Providers:  IncredibleEmployment.be  This test is no t yet approved or cleared by the [place] FDA and  has been authorized for detection and/or diagnosis of SARS-CoV-2 by FDA under an Emergency Use Authorization (EUA). This EUA will remain  in effect (meaning this test can be used) for the duration of the COVID-19 declaration under Section 564(b)(1) of the Act, 21 U.S.C.section 360bbb-3(b)(1), unless the authorization is terminated  or revoked sooner.       Influenza A by PCR NEGATIVE NEGATIVE Final   Influenza B by PCR NEGATIVE NEGATIVE Final    Comment: (NOTE) The Xpert Xpress SARS-CoV-2/FLU/RSV plus assay is intended as an aid in the diagnosis of influenza from Nasopharyngeal swab specimens and should not be used as a sole basis for treatment. Nasal washings and aspirates are unacceptable for Xpert Xpress SARS-CoV-2/FLU/RSV testing.  Fact Sheet for Patients: EntrepreneurPulse.com.au  Fact Sheet for Healthcare Providers: IncredibleEmployment.be  This test is not yet approved or cleared by the [place] FDA and has been authorized for detection and/or diagnosis of SARS-CoV-2 by FDA under an Emergency Use Authorization (EUA). This EUA will remain in effect (meaning this test can be used) for the duration of the COVID-19 declaration under Section 564(b)(1) of the Act, 21 U.S.C. section 360bbb-3(b)(1), unless the authorization is terminated or revoked.  Performed at [facility], [place] [address] [place], [place] [address]       Studies:  IR US Guide Bx Asp/Drain  Result Date: 01/01/2022 CLINICAL DATA:  Status post attempted and failed right knee joint aspiration at Urgent Care. Right knee pain, suprapatellar bursal effusion seen on previous x-ray. Request for aspiration of the effusion. EXAM: ULTRASOUND GUIDED RIGHT KNEE JOINT ASPIRATION PROCEDURE: After a thorough discussion of risks and benefits of the procedure including the general risks of bleeding, infection, damage to adjacent structures, written consent was obtained. Specific risks of this procedure included false positive and false negative results and introduction of infection into the  joint. Time-out form was completed when appropriate. The right knee was examined and scanned by ultrasound. The skin was prepped and draped in the usual sterile fashion with Chloraprep. Local anesthesia and deep anesthesia was provided with 1% lidocaine without epinephrine. Careful attention was paid not to inject bacteriostatic lidocaine into the joint. Under direct ultrasound guidance, a 20 gauge spinal needle was advanced into the suprapatellar space of the right knee joint from a lateral approach. Aspiration was performed which yielded 8 mL of serosanguinous fluid. Fluid was sent for requested lab analysis. The needle was removed and a dressing was placed. IMPRESSION: Technically successful right knee aspiration at the level of a suprapatellar joint effusion yielding 8 mL of serosanguinous fluid. Read by: Durenda Guthrie, PA-C  Electronically Signed   By: Aletta Edouard M.D.   On: 01/01/2022 14:56   DG Knee Complete 4 Views Left  Result Date: 12/31/2021 CLINICAL DATA:  Left knee pain. EXAM: LEFT KNEE - COMPLETE 4+ VIEW COMPARISON:  None FINDINGS: There is no acute fracture or dislocation. Mild osteopenia. Moderate arthritic changes with tricompartmental narrowing. No joint effusion. The soft tissues are unremarkable. IMPRESSION: 1. No acute fracture or dislocation. 2. Moderate arthritic changes. Electronically Signed   By: Anner Crete M.D.   On: 12/31/2021 21:12   DG Knee Complete 4 Views Right  Result Date: 12/31/2021 CLINICAL DATA:  Redness and swelling at both knees. Possible septic arthropathy. EXAM: RIGHT KNEE - COMPLETE 4+ VIEW COMPARISON:  AP Lat right knee 11/01/2021. FINDINGS: There is mild circumferential soft tissue fullness. There is a minimal suprapatellar bursal effusion on lateral view There is mild osteopenia. No fracture is evident. There are mild features of nonerosive tricompartmental degenerative arthrosis consisting of mild femorotibial and patellofemoral joint space loss and small tricompartmental marginal spurs. There is enthesopathic spurring of the anterior superior patella which was also seen previously. There is a fabella posterolaterally. Early degenerative arthrosis of the proximal tibiofibular joint. IMPRESSION: 1. Mild generalized soft tissue swelling. 2. Minimal suprapatellar bursal effusion. 3. 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 4. Only noteworthy interval change since 11/01/2021 is the superficial soft tissue prominence. Electronically Signed   By: Telford Nab M.D.   On: 12/31/2021 21:16    Assessment: 81 y.o. 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

## 2022-01-01 NOTE — Progress Notes (Signed)
Patient was sent to [facility] Emergency Department from [facility] last night. The provider at Urgent Care spoke with his oncologist on call who recommended medical admission for IV antibiotics.   Pain with ROM of bilateral knees. Effusion of the right knee. His PCP has attempted to do an aspiration of the right knee and was unsuccessful. Patient with worsening pain and difficulty ambulating.   He will likely need an arthrocentesis of the right knee (ultrasound guided versus IR) - possible hemarthrosis versus gout versus septic knee. Recommend beginning IV antibiotics.   Noemi Chapel, PA-C 01/01/2022

## 2022-01-01 NOTE — Progress Notes (Signed)
Pharmacy Antibiotic Note  Eugene Cooper is a 81 y.o. male admitted on 12/31/2021. Patient has history of myelodysplastic syndrome. Is no history of pancytopenia. Daughter reports that yesterday he was seen by his oncologist and given a transfusion.  He then ended up at an orthopedic urgent care due to knee pain these had said for several weeks.  They attempted to do arthrocentesis that failed.  He was sent here for further evaluation Pharmacy has been consulted to dose vancomycin for cellulitis  Plan: Vancomycin 1gm IV x 1 then 1gm q24h (AUC 482.3, Scr 0.8) Follow renal function and clinical course  Height: 5\' 2"  (157.5 cm) Weight: 55.8 kg (123 lb) IBW/kg (Calculated) : 54.6  Temp (24hrs), Avg:98.5 F (36.9 C), Min:98.2 F (36.8 C), Max:99 F (37.2 C)  Recent Labs  Lab 12/25/21 0938 12/28/21 0931 12/31/21 0822 12/31/21 0825 01/01/22 0516  WBC 2.4* 2.1* 1.5*  --  1.8*  CREATININE  --   --   --  0.79  --     Estimated Creatinine Clearance: 56.9 mL/min (by C-G formula based on SCr of 0.79 mg/dL).    No Known Allergies   Thank you for allowing pharmacy to be a part of this patients care.  Dolly Rias RPh 01/01/2022, 6:40 AM

## 2022-01-01 NOTE — Consult Note (Signed)
Reason for Consult:bilateral knee pain Referring Physician: Romelle Starcher Pain Management [name] is an 81 y.o. male.  HPI: 89 yom with Myelodysplastic syndrome causing pancytopenia who speaks only Spanish is accompanied by his wife who also only speaks Spanish and his daughter who is a Engineer, maintenance in Community education officer but is serving as Optometrist.  Patient was seen by his oncologist on 12/31/21 in the am.  Chemo not done because platelet count was 8.  Referred to his pain management specialist, [facility].  They had seen him 3 weeks ago for his knee pain and given him cortisone shots.  Today they attempted to aspirate his right knee and were unsuccessful and referred him to [place] urgent care.  I examined him there.  With his significant pain and weakness we were unable to do much after hours.  I called Dr Chryl Heck the Oncologist on call.  We agreed he would best be served by inpatient work up of the knee pain, new onset weakness.  We sent him to the ER for hospitalist admission and possible ultrasound guided aspiration of his right knee.  Right knee has 1+effusion with significant pain and weakness.  I am not sure if the knee effusion is a hemarthrosis from unsuccessful aspiration attempt at [facility] today in the setting of a platelet count of 8 or if the knee effusion is a septic knee in the setting of neutropenia.  Both knees appear to have cellulitis vs septic prepatella bursitis that is exquisitely tender and red. Patient also has a history of possible gout and was started on colchicine 0.6 mg 1-2 months ago.  IN COMBINATION WITH ATORVASTATIN 40MG , THIS CAN CAUSE DEBILITATING LOWER EXTREMITY MUSCLE PAIN AND WEAKNESS.  I HAVE RECOMMEND HE STOP THE COLCHICINE.       Past Medical History:  Diagnosis Date   CKD (chronic kidney disease), stage III ([place])    Diabetes ([place])    Hyperlipemia    Hypertension    LBBB (left bundle  branch block)    Stroke [facility])     Past Surgical History:  Procedure Laterality Date   BIOPSY  10/29/2021   Procedure: BIOPSY;  Surgeon: Thornton Park, MD;  Location: [place];  Service: Gastroenterology;;   ESOPHAGOGASTRODUODENOSCOPY (EGD) WITH PROPOFOL N/A 10/29/2021   Procedure: ESOPHAGOGASTRODUODENOSCOPY (EGD) WITH PROPOFOL;  Surgeon: Thornton Park, MD;  Location: [place];  Service: Gastroenterology;  Laterality: N/A;    Family History  Problem Relation Age of Onset   Stroke Mother    Lung disease Father    Diabetes Sister    Heart disease Sister    Hyperlipidemia Son    Hyperlipidemia Daughter    Hypertension Daughter     Social History:  reports that he has never smoked. He has never used smokeless tobacco. He reports that he does not currently use alcohol. He reports that he does not use drugs.  Allergies: No Known Allergies  Medications: I have reviewed the patient's current medications.  Results for orders placed or performed during the hospital encounter of 12/31/21 (from the past 48 hour(s))  Sedimentation rate     Status: Abnormal   Collection Time: 12/31/21  9:08 PM  Result Value Ref Range   Sed Rate 58 (H) 0 - 16 mm/hr    Comment: Performed at [facility], [place] [address], [place], [place] [address]  C-reactive protein     Status: Abnormal   Collection Time: 12/31/21  9:08 PM  Result Value Ref Range   CRP 6.8 (H) <1.0 mg/dL    Comment: Performed at [facility] [address] [place], [place] [address]  Resp Panel by RT-PCR (Flu A&B, Covid) Nasopharyngeal Swab     Status: None   Collection Time: 01/01/22  5:16 AM   Specimen: Nasopharyngeal Swab; Nasopharyngeal(NP) swabs in vial transport medium  Result Value Ref Range   SARS Coronavirus 2 by RT PCR NEGATIVE NEGATIVE    Comment: (NOTE) SARS-CoV-2 target nucleic acids are NOT DETECTED.  The SARS-CoV-2 RNA is generally detectable in upper respiratory specimens  during the acute phase of infection. The lowest concentration of SARS-CoV-2 viral copies this assay can detect is 138 copies/mL. A negative result does not preclude SARS-Cov-2 infection and should not be used as the sole basis for treatment or other patient management decisions. A negative result may occur with  improper specimen collection/handling, submission of specimen other than nasopharyngeal swab, presence of viral mutation(s) within the areas targeted by this assay, and inadequate number of viral copies(<138 copies/mL). A negative result must be combined with clinical observations, patient history, and epidemiological information. The expected result is Negative.  Fact Sheet for Patients:  EntrepreneurPulse.com.au  Fact Sheet for Healthcare Providers:  IncredibleEmployment.be  This test is no t yet approved or cleared by the [place] FDA and  has been authorized for detection and/or diagnosis of SARS-CoV-2 by FDA under an Emergency Use Authorization (EUA). This EUA will remain  in effect (meaning this test can be used) for the duration of the COVID-19 declaration under Section 564(b)(1) of the Act, 21 U.S.C.section 360bbb-3(b)(1), unless the authorization is terminated  or revoked sooner.       Influenza A by PCR NEGATIVE NEGATIVE   Influenza B by PCR NEGATIVE NEGATIVE    Comment: (NOTE) The Xpert Xpress SARS-CoV-2/FLU/RSV plus assay is intended as an aid in the diagnosis of influenza from Nasopharyngeal swab specimens and should not be used as a sole basis for treatment. Nasal washings and aspirates are unacceptable for Xpert Xpress SARS-CoV-2/FLU/RSV testing.  Fact Sheet for Patients: EntrepreneurPulse.com.au  Fact Sheet for Healthcare Providers: IncredibleEmployment.be  This test is not yet approved or cleared by the [place] FDA and has been authorized for detection and/or diagnosis  of SARS-CoV-2 by FDA under an Emergency Use Authorization (EUA). This EUA will remain in effect (meaning this test can be used) for the duration of the COVID-19 declaration under Section 564(b)(1) of the Act, 21 U.S.C. section 360bbb-3(b)(1), unless the authorization is terminated or revoked.  Performed at [facility], [place] [address], [place], [place] [address]   CBC with Differential/Platelet     Status: Abnormal   Collection Time: 01/01/22  5:16 AM  Result Value Ref Range   WBC 1.8 (L) 4.0 - 10.5 K/uL   RBC 3.10 (L) 4.22 - 5.81 MIL/uL   Hemoglobin 9.4 (L) 13.0 - 17.0 g/dL   HCT 26.8 (L) 39.0 - 52.0 %   MCV 86.5 80.0 - 100.0 fL   MCH 30.3 26.0 - 34.0 pg   MCHC 35.1 30.0 - 36.0 g/dL   RDW 14.6 11.5 - 15.5 %   Platelets 13 (LL) 150 - 400 K/uL    Comment: SPECIMEN CHECKED FOR CLOTS Immature Platelet Fraction may be clinically indicated, consider ordering this additional test [id number] REPEATED TO VERIFY PLATELET COUNT CONFIRMED BY SMEAR    nRBC 0.0 0.0 - 0.2 %   Neutrophils Relative % 34 %   Neutro Abs  0.6 (L) 1.7 - 7.7 K/uL   Lymphocytes Relative 58 %   Lymphs Abs 1.0 0.7 - 4.0 K/uL   Monocytes Relative 2 %   Monocytes Absolute 0.0 (L) 0.1 - 1.0 K/uL   Eosinophils Relative 0 %   Eosinophils Absolute 0.0 0.0 - 0.5 K/uL   Basophils Relative 0 %   Basophils Absolute 0.0 0.0 - 0.1 K/uL   WBC Morphology BLASTS PRESENT    Myelocytes 2 %   Blasts 4 %   Abs Immature Granulocytes 0.00 0.00 - 0.07 K/uL    Comment: Performed at [facility], [place] [address], [place], [place] [address]  Protime-INR     Status: None   Collection Time: 01/01/22  5:16 AM  Result Value Ref Range   Prothrombin Time 14.7 11.4 - 15.2 seconds   INR 1.2 0.8 - 1.2    Comment: (NOTE) INR goal varies based on device and disease states. Performed at [facility], [place] [address], [place], [place] [address]   Type and screen [place]     Status: None   Collection Time: 01/01/22  8:45 AM  Result Value Ref Range   ABO/RH(D) O POS    Antibody Screen NEG    Sample Expiration      01/04/2022,2359 Performed at [facility], [place] [address], [place], [place] [address]     DG Knee Complete 4 Views Left  Result Date: 12/31/2021 CLINICAL DATA:  Left knee pain. EXAM: LEFT KNEE - COMPLETE 4+ VIEW COMPARISON:  None FINDINGS: There is no acute fracture or dislocation. Mild osteopenia. Moderate arthritic changes with tricompartmental narrowing. No joint effusion. The soft tissues are unremarkable. IMPRESSION: 1. No acute fracture or dislocation. 2. Moderate arthritic changes. Electronically Signed   By: Anner Crete M.D.   On: 12/31/2021 21:12   DG Knee Complete 4 Views Right  Result Date: 12/31/2021 CLINICAL DATA:  Redness and swelling at both knees. Possible septic arthropathy. EXAM: RIGHT KNEE - COMPLETE 4+ VIEW COMPARISON:  AP Lat right knee 11/01/2021. FINDINGS: There is mild circumferential soft tissue fullness. There is a minimal suprapatellar bursal effusion on lateral view There is mild osteopenia. No fracture is evident. There are mild features of nonerosive tricompartmental degenerative arthrosis consisting of mild femorotibial and patellofemoral joint space loss and small tricompartmental marginal spurs. There is enthesopathic spurring of the anterior superior patella which was also seen previously. There is a fabella posterolaterally. Early degenerative arthrosis of the proximal tibiofibular joint. IMPRESSION: 1. Mild generalized soft tissue swelling. 2. Minimal suprapatellar bursal effusion. 3. 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 4. Only noteworthy interval change since 11/01/2021 is the superficial soft tissue prominence. Electronically Signed   By: Telford Nab M.D.   On: 12/31/2021 21:16    Review of Systems  Constitutional:  Positive for activity change and  fever.  HENT: Negative.    Eyes: Negative.   Respiratory: Negative.    Cardiovascular: Negative.   Gastrointestinal: Negative.   Endocrine: Negative.   Genitourinary: Negative.   Musculoskeletal:  Positive for arthralgias, back pain, gait problem and joint swelling.  Allergic/Immunologic: Negative.   Hematological:  Positive for adenopathy. Bruises/bleeds easily.  Psychiatric/Behavioral: Negative.    Blood pressure 131/80, pulse 91, temperature 98.8 F (37.1 C), temperature source Oral, resp. rate 16, height 5\' 2"  (1.575 m), weight 55.8 kg, SpO2 100 %. Physical Exam Constitutional:      Appearance: He is ill-appearing.  HENT:  Head: Normocephalic.     Right Ear: External ear normal.     Left Ear: External ear normal.     Nose: Nose normal.     Mouth/Throat:     Pharynx: Oropharynx is clear.  Eyes:     Conjunctiva/sclera: Conjunctivae normal.  Pulmonary:     Effort: Pulmonary effort is normal.  Abdominal:     Palpations: Abdomen is soft.  Genitourinary:    Comments: Not pertinent to current symptomatology therefore not examined.  Musculoskeletal:     Cervical back: Neck supple.     Comments: Bilateral knee pain.  Right worse than left.  Right knee has small effusion.  Left knee has no effusion.  Bilateral knee have redness on the anterior aspect over the patella that is exquisitely tender.  Right knee has painful passive range of motion.  Patient is two plus assist to stand.  He is unable to ambulate even using a walker. Patient is unable to extend right leg against gravity in a sitting position.  DNVI  Skin:    General: Skin is dry.     Capillary Refill: Capillary refill takes less than 2 seconds.     Findings: Bruising and erythema present.  Neurological:     Mental Status: He is alert.  Psychiatric:        Behavior: Behavior normal.    Assessment: Principal Problem:   SIRS (systemic inflammatory response syndrome) (HCC) Active Problems:   Uncontrolled type 2  diabetes mellitus with hyperglycemia, without long-term current use of insulin (HCC)   Pancytopenia (HCC)   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HCC)    Plan:  Interventional radiology has been consulted to do ultrasound/CT guided aspiration of his right knee.  Due to his pan cytopenia, he is obviously not a candidate for orthopedic surgery, but would benefit from medical treatment that might include IV antibiotics, prednisone, physical therapy and occupational therapy for mobilization.  Will defer all treatment recommendations to the hospitalist and the oncologist.  Please call Eugene Chapel, PA-C at [facility], [phone] if any surgical intervention needs to be reconsidered.    Rianne Degraaf J Roddrick Sharron 01/01/2022, 10:10 AM   Myiah Petkus A. Kaleen Mask Physician Assistant [facility] [phone]  01/01/2022, 10:10 AM

## 2022-01-01 NOTE — Procedures (Signed)
PROCEDURE SUMMARY:  Successful image-guided right suprapatellar effusion aspiration. Yielded 8 mL of serosanguineous fluid. Pt tolerated procedure well. No immediate complications. EBL = trace   Specimen was sent for labs.  Please see imaging section of Epic for full dictation.  Alexzavier Girardin Lemmie Evens Arshiya Jakes PA-C 01/01/2022 2:22 PM

## 2022-01-01 NOTE — ED Notes (Signed)
Consult call placed and delivered to [facility] MD from [phone]

## 2022-01-02 ENCOUNTER — Telehealth: Payer: Self-pay | Admitting: Hematology

## 2022-01-02 ENCOUNTER — Ambulatory Visit: Payer: Medicare Other

## 2022-01-02 ENCOUNTER — Inpatient Hospital Stay: Payer: Medicare Other

## 2022-01-02 ENCOUNTER — Other Ambulatory Visit: Payer: Medicare Other

## 2022-01-02 DIAGNOSIS — D469 Myelodysplastic syndrome, unspecified: Secondary | ICD-10-CM | POA: Diagnosis not present

## 2022-01-02 DIAGNOSIS — M25061 Hemarthrosis, right knee: Secondary | ICD-10-CM | POA: Diagnosis not present

## 2022-01-02 DIAGNOSIS — R6510 Systemic inflammatory response syndrome (SIRS) of non-infectious origin without acute organ dysfunction: Secondary | ICD-10-CM | POA: Diagnosis not present

## 2022-01-02 DIAGNOSIS — M25461 Effusion, right knee: Secondary | ICD-10-CM | POA: Diagnosis not present

## 2022-01-02 DIAGNOSIS — E440 Moderate protein-calorie malnutrition: Secondary | ICD-10-CM | POA: Diagnosis not present

## 2022-01-02 LAB — CBC WITH DIFFERENTIAL/PLATELET
Abs Immature Granulocytes: 0 10*3/uL (ref 0.00–0.07)
Basophils Absolute: 0 10*3/uL (ref 0.0–0.1)
Basophils Relative: 0 %
Blasts: 5 %
Eosinophils Absolute: 0 10*3/uL (ref 0.0–0.5)
Eosinophils Relative: 0 %
HCT: 24.3 % — ABNORMAL LOW (ref 39.0–52.0)
Hemoglobin: 8.3 g/dL — ABNORMAL LOW (ref 13.0–17.0)
Lymphocytes Relative: 56 %
Lymphs Abs: 1.1 10*3/uL (ref 0.7–4.0)
MCH: 30 pg (ref 26.0–34.0)
MCHC: 34.2 g/dL (ref 30.0–36.0)
MCV: 87.7 fL (ref 80.0–100.0)
Monocytes Absolute: 0.1 10*3/uL (ref 0.1–1.0)
Monocytes Relative: 7 %
Neutro Abs: 0.6 10*3/uL — ABNORMAL LOW (ref 1.7–7.7)
Neutrophils Relative %: 32 %
Platelets: 24 10*3/uL — CL (ref 150–400)
RBC: 2.77 MIL/uL — ABNORMAL LOW (ref 4.22–5.81)
RDW: 14.4 % (ref 11.5–15.5)
WBC: 1.9 10*3/uL — ABNORMAL LOW (ref 4.0–10.5)
nRBC: 0 % (ref 0.0–0.2)

## 2022-01-02 LAB — COMPREHENSIVE METABOLIC PANEL
ALT: 25 U/L (ref 0–44)
AST: 27 U/L (ref 15–41)
Albumin: 2.9 g/dL — ABNORMAL LOW (ref 3.5–5.0)
Alkaline Phosphatase: 106 U/L (ref 38–126)
Anion gap: 9 (ref 5–15)
BUN: 20 mg/dL (ref 8–23)
CO2: 26 mmol/L (ref 22–32)
Calcium: 8.3 mg/dL — ABNORMAL LOW (ref 8.9–10.3)
Chloride: 97 mmol/L — ABNORMAL LOW (ref 98–111)
Creatinine, Ser: 0.6 mg/dL — ABNORMAL LOW (ref 0.61–1.24)
GFR, Estimated: >60 mL/min (ref >60)
Glucose, Bld: 243 mg/dL — ABNORMAL HIGH (ref 70–99)
Potassium: 3.9 mmol/L (ref 3.5–5.1)
Sodium: 132 mmol/L — ABNORMAL LOW (ref 135–145)
Total Bilirubin: 1.1 mg/dL (ref 0.3–1.2)
Total Protein: 5.7 g/dL — ABNORMAL LOW (ref 6.5–8.1)

## 2022-01-02 LAB — CBC
HCT: 24.8 % — ABNORMAL LOW (ref 39.0–52.0)
Hemoglobin: 8.5 g/dL — ABNORMAL LOW (ref 13.0–17.0)
MCH: 30.1 pg (ref 26.0–34.0)
MCHC: 34.3 g/dL (ref 30.0–36.0)
MCV: 87.9 fL (ref 80.0–100.0)
Platelets: 28 10*3/uL — CL (ref 150–400)
RBC: 2.82 MIL/uL — ABNORMAL LOW (ref 4.22–5.81)
RDW: 14.4 % (ref 11.5–15.5)
WBC: 2 10*3/uL — ABNORMAL LOW (ref 4.0–10.5)
nRBC: 0 % (ref 0.0–0.2)

## 2022-01-02 LAB — GLUCOSE, CAPILLARY
Glucose-Capillary: 196 mg/dL — ABNORMAL HIGH (ref 70–99)
Glucose-Capillary: 186 mg/dL — ABNORMAL HIGH (ref 70–99)
Glucose-Capillary: 172 mg/dL — ABNORMAL HIGH (ref 70–99)
Glucose-Capillary: 219 mg/dL — ABNORMAL HIGH (ref 70–99)

## 2022-01-02 LAB — BPAM PLATELET PHERESIS
Blood Product Expiration Date: 202301252359
ISSUE DATE / TIME: 202301241250
Unit Type and Rh: 5100

## 2022-01-02 LAB — PREPARE PLATELET PHERESIS: Unit division: 0

## 2022-01-02 MED ORDER — INSULIN ASPART 100 UNIT/ML IJ SOLN
2.0000 [IU] | Freq: Three times a day (TID) | INTRAMUSCULAR | Status: DC
  Administered 2022-01-02 – 2022-01-03 (×3): 2 [IU] via SUBCUTANEOUS

## 2022-01-02 MED ORDER — POLYETHYLENE GLYCOL 3350 17 G PO PACK
17.0000 g | PACK | Freq: Every day | ORAL | Status: DC
  Administered 2022-01-02 – 2022-01-05 (×3): 17 g via ORAL
  Filled 2022-01-02 (×4): qty 1

## 2022-01-02 NOTE — Telephone Encounter (Signed)
Scheduled follow-up appointments per 1/23 los. Patient is aware.

## 2022-01-02 NOTE — Progress Notes (Signed)
[facility] PROGRESS NOTE    Progress Note  Eugene Cooper  EXH:[id number] DOB: [birth year] DOA: 12/31/2021 PCP: Emelia Loron, NP     Brief Narrative:   Eugene Cooper is an 81 y.o. male past medical history significant for hyperlipidemia, myelodysplastic syndrome, gout pancytopenia comes in with bilateral knee pain went to his PCP and tried to have his joint aspirated but they were unable to get anything came to the ED x-ray of the knee was notable for soft tissue swelling Ortho was consulted started on IV vancomycin recommended IR consultation for guided aspiration of the right knee due to pancytopenia, they recommended treatment with IV antibiotics and prednisone and physical therapy    Assessment/Plan:   Bilateral knee pain: Orthopedic surgery was consulted recommended joint aspiration and to start IV vancomycin. IR was consulted due to thrombocytopenia who performed arthrocentesis. And was turbid 11 white blood cells with 60% neutrophils and 30% lymphocytes with no crystals. Possibly hemarthrosis versus inflammatory serosanguineous fluid red Per documentation we will aspirate blood cell count, he only has 11 white blood cell count. Cultures and Gram stains are negative.  MDS/pancytopenia: Spoke with Dr. Annamaria Boots recommended 1 unit of platelets prior to aspiration which was done. Her platelet count have remained stable. White blood cells continues to be to. Her's hemoglobin is stable at 8.5. Platelets are improving.  Diabetes mellitus type 2: Continue sliding scale insulin.  Add meal coverage 3 units.  Essential hypertension: Continue current home regimen no changes made, blood pressure is relatively controlled heart rate has improved.  Hyperlipidemia: Continue statins.    DVT prophylaxis: SCD Family Communication:none Status is: Inpatient  Remains inpatient appropriate because: Bilateral knee pain    Code Status:     Code Status Orders  (From  admission, onward)           Start     Ordered   01/01/22 1007  Full code  Continuous        01/01/22 1006           Code Status History     Date Active Date Inactive Code Status Order ID Comments User Context   12/05/2021 1607 12/06/2021 2101 Full Code [id number]  Jonnie Finner, DO ED   10/28/2021 2054 11/10/2021 1940 Full Code [id number]  Orene Desanctis, DO ED   12/22/2019 1327 01/07/2020 1647 Full Code [id number]  Bary Leriche, PA-C Inpatient   12/19/2019 2113 12/22/2019 1307 Full Code [id number]  Bethena Roys, MD Inpatient         IV Access:   Peripheral IV   Procedures and diagnostic studies:   IR US Guide Bx Asp/Drain  Result Date: 01/01/2022 CLINICAL DATA:  Status post attempted and failed right knee joint aspiration at Urgent Care. Right knee pain, suprapatellar bursal effusion seen on previous x-ray. Request for aspiration of the effusion. EXAM: ULTRASOUND GUIDED RIGHT KNEE JOINT ASPIRATION PROCEDURE: After a thorough discussion of risks and benefits of the procedure including the general risks of bleeding, infection, damage to adjacent structures, written consent was obtained. Specific risks of this procedure included false positive and false negative results and introduction of infection into the joint. Time-out form was completed when appropriate. The right knee was examined and scanned by ultrasound. The skin was prepped and draped in the usual sterile fashion with Chloraprep. Local anesthesia and deep anesthesia was provided with 1% lidocaine without epinephrine. Careful attention was paid not to inject bacteriostatic lidocaine into the joint. Under direct ultrasound guidance, a 20 gauge  spinal needle was advanced into the suprapatellar space of the right knee joint from a lateral approach. Aspiration was performed which yielded 8 mL of serosanguinous fluid. Fluid was sent for requested lab analysis. The needle was removed and a dressing was placed. IMPRESSION:  Technically successful right knee aspiration at the level of a suprapatellar joint effusion yielding 8 mL of serosanguinous fluid. Read by: Durenda Guthrie, PA-C Electronically Signed   By: Aletta Edouard M.D.   On: 01/01/2022 14:56   DG Knee Complete 4 Views Left  Result Date: 12/31/2021 CLINICAL DATA:  Left knee pain. EXAM: LEFT KNEE - COMPLETE 4+ VIEW COMPARISON:  None FINDINGS: There is no acute fracture or dislocation. Mild osteopenia. Moderate arthritic changes with tricompartmental narrowing. No joint effusion. The soft tissues are unremarkable. IMPRESSION: 1. No acute fracture or dislocation. 2. Moderate arthritic changes. Electronically Signed   By: Anner Crete M.D.   On: 12/31/2021 21:12   DG Knee Complete 4 Views Right  Result Date: 12/31/2021 CLINICAL DATA:  Redness and swelling at both knees. Possible septic arthropathy. EXAM: RIGHT KNEE - COMPLETE 4+ VIEW COMPARISON:  AP Lat right knee 11/01/2021. FINDINGS: There is mild circumferential soft tissue fullness. There is a minimal suprapatellar bursal effusion on lateral view There is mild osteopenia. No fracture is evident. There are mild features of nonerosive tricompartmental degenerative arthrosis consisting of mild femorotibial and patellofemoral joint space loss and small tricompartmental marginal spurs. There is enthesopathic spurring of the anterior superior patella which was also seen previously. There is a fabella posterolaterally. Early degenerative arthrosis of the proximal tibiofibular joint. IMPRESSION: 1. Mild generalized soft tissue swelling. 2. Minimal suprapatellar bursal effusion. 3. 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 4. Only noteworthy interval change since 11/01/2021 is the superficial soft tissue prominence. Electronically Signed   By: Telford Nab M.D.   On: 12/31/2021 21:16     Medical Consultants:   None.   Subjective:    Eugene Cooper relates his pain is controlled has  not had a bowel movement.  Objective:    Vitals:   01/01/22 1831 01/01/22 2143 01/02/22 0137 01/02/22 0535  BP:  119/73 119/61 132/77  Pulse:  (!) 107 (!) 109 (!) 110  Resp:  18 18 18   Temp:  99.8 F (37.7 C) 98.7 F (37.1 C) 100.3 F (37.9 C)  TempSrc:  Oral Oral Oral  SpO2:  99% 99% 99%  Weight: 55.4 kg     Height: 5\' 2"  (1.575 m)      SpO2: 99 % O2 Flow Rate (L/min): 2 L/min   Intake/Output Summary (Last 24 hours) at 01/02/2022 0957 Last data filed at 01/02/2022 0600 Gross per 24 hour  Intake 1302.83 ml  Output --  Net 1302.83 ml   Filed Weights   12/31/21 1942 01/01/22 1831  Weight: 55.8 kg 55.4 kg    Exam: General exam: In no acute distress. Respiratory system: Good air movement and clear to auscultation. Cardiovascular system: S1 & S2 heard, RRR. No JVD. Gastrointestinal system: Abdomen is nondistended, soft and nontender.  Extremities: No pedal edema. Skin: No rashes, lesions or ulcers Psychiatry: Judgement and insight appear normal. Mood & affect appropriate.    Data Reviewed:    Labs: Basic Metabolic Panel: Recent Labs  Lab 12/31/21 0825 01/01/22 1059 01/02/22 0514  NA 132* 133* 132*  K 3.9 3.7 3.9  CL 98 98 97*  CO2 23 26 26   GLUCOSE 288* 231* 243*  BUN 15 21 20   CREATININE 0.79  0.66 0.60*  CALCIUM 9.0 8.3* 8.3*   GFR Estimated Creatinine Clearance: 56.9 mL/min (A) (by C-G formula based on SCr of 0.6 mg/dL (L)). Liver Function Tests: Recent Labs  Lab 12/31/21 0825 01/01/22 1059 01/02/22 0514  AST 9* 16 27  ALT 10 16 25   ALKPHOS 93 92 106  BILITOT 1.0 0.9 1.1  PROT 6.0* 5.8* 5.7*  ALBUMIN 3.5 3.0* 2.9*   No results for input(s): LIPASE, AMYLASE in the last 168 hours. No results for input(s): AMMONIA in the last 168 hours. Coagulation profile Recent Labs  Lab 01/01/22 0516  INR 1.2   COVID-19 Labs  Recent Labs    12/31/21 2108  CRP 6.8*    Lab Results  Component Value Date   SARSCOV2NAA NEGATIVE 01/01/2022    [place] NEGATIVE 12/05/2021   [place] NEGATIVE 11/29/2021   [place] NEGATIVE 10/28/2021    CBC: Recent Labs  Lab 12/28/21 0931 12/31/21 0822 01/01/22 0516 01/02/22 0514  WBC 2.1* 1.5* 1.8* 2.0*  NEUTROABS 0.2* 0.7* 0.6*  --   HGB 9.3* 7.5* 9.4* 8.5*  HCT 26.1* 21.3* 26.8* 24.8*  MCV 84.5 84.9 86.5 87.9  PLT 12* 8* 13* 28*   Cardiac Enzymes: No results for input(s): CKTOTAL, CKMB, CKMBINDEX, TROPONINI in the last 168 hours. BNP (last 3 results) No results for input(s): PROBNP in the last 8760 hours. CBG: Recent Labs  Lab 01/01/22 1205 01/01/22 1638 01/01/22 2039 01/02/22 0731  GLUCAP 195* 208* 126* 196*   D-Dimer: No results for input(s): DDIMER in the last 72 hours. Hgb A1c: No results for input(s): HGBA1C in the last 72 hours. Lipid Profile: No results for input(s): CHOL, HDL, LDLCALC, TRIG, CHOLHDL, LDLDIRECT in the last 72 hours. Thyroid function studies: No results for input(s): TSH, T4TOTAL, T3FREE, THYROIDAB in the last 72 hours.  Invalid input(s): FREET3 Anemia work up: No results for input(s): VITAMINB12, FOLATE, FERRITIN, TIBC, IRON, RETICCTPCT in the last 72 hours. Sepsis Labs: Recent Labs  Lab 12/28/21 0931 12/31/21 0822 01/01/22 0516 01/02/22 0514  WBC 2.1* 1.5* 1.8* 2.0*   Microbiology Recent Results (from the past 240 hour(s))  Culture, Urine     Status: Abnormal   Collection Time: 12/27/21  4:08 PM   Specimen: Urine, Clean Catch  Result Value Ref Range Status   Specimen Description   Final    URINE, CLEAN CATCH Performed at [facility], [address] [place], [place] [address]    Special Requests   Final    NONE Performed at [facility], [place] [address] [place], [place] [address]    Culture (A)  Final    <10,000 COLONIES/mL INSIGNIFICANT GROWTH Performed at [place] [address], [place],  [address]    Report Status 12/28/2021 FINAL  Final  Resp  Panel by RT-PCR (Flu A&B, Covid) Nasopharyngeal Swab     Status: None   Collection Time: 01/01/22  5:16 AM   Specimen: Nasopharyngeal Swab; Nasopharyngeal(NP) swabs in vial transport medium  Result Value Ref Range Status   SARS Coronavirus 2 by RT PCR NEGATIVE NEGATIVE Final    Comment: (NOTE) SARS-CoV-2 target nucleic acids are NOT DETECTED.  The SARS-CoV-2 RNA is generally detectable in upper respiratory specimens during the acute phase of infection. The lowest concentration of SARS-CoV-2 viral copies this assay can detect is 138 copies/mL. A negative result does not preclude SARS-Cov-2 infection and should not be used as the sole basis for treatment or other patient management decisions.  A negative result may occur with  improper specimen collection/handling, submission of specimen other than nasopharyngeal swab, presence of viral mutation(s) within the areas targeted by this assay, and inadequate number of viral copies(<138 copies/mL). A negative result must be combined with clinical observations, patient history, and epidemiological information. The expected result is Negative.  Fact Sheet for Patients:  EntrepreneurPulse.com.au  Fact Sheet for Healthcare Providers:  IncredibleEmployment.be  This test is no t yet approved or cleared by the [place] FDA and  has been authorized for detection and/or diagnosis of SARS-CoV-2 by FDA under an Emergency Use Authorization (EUA). This EUA will remain  in effect (meaning this test can be used) for the duration of the COVID-19 declaration under Section 564(b)(1) of the Act, 21 U.S.C.section 360bbb-3(b)(1), unless the authorization is terminated  or revoked sooner.       Influenza A by PCR NEGATIVE NEGATIVE Final   Influenza B by PCR NEGATIVE NEGATIVE Final    Comment: (NOTE) The Xpert Xpress SARS-CoV-2/FLU/RSV plus assay is intended as an aid in the diagnosis of influenza from Nasopharyngeal  swab specimens and should not be used as a sole basis for treatment. Nasal washings and aspirates are unacceptable for Xpert Xpress SARS-CoV-2/FLU/RSV testing.  Fact Sheet for Patients: EntrepreneurPulse.com.au  Fact Sheet for Healthcare Providers: IncredibleEmployment.be  This test is not yet approved or cleared by the [place] FDA and has been authorized for detection and/or diagnosis of SARS-CoV-2 by FDA under an Emergency Use Authorization (EUA). This EUA will remain in effect (meaning this test can be used) for the duration of the COVID-19 declaration under Section 564(b)(1) of the Act, 21 U.S.C. section 360bbb-3(b)(1), unless the authorization is terminated or revoked.  Performed at [facility], [place] [address], [place], [place] [address]   Blood culture (routine x 2)     Status: None (Preliminary result)   Collection Time: 01/01/22  6:27 AM   Specimen: BLOOD  Result Value Ref Range Status   Specimen Description   Final    BLOOD BLOOD LEFT HAND Performed at [place] [address] [place], [place] [address]    Special Requests   Final    BOTTLES DRAWN AEROBIC AND ANAEROBIC Blood Culture adequate volume Performed at [place] [address], [place], [place] [address]    Culture   Final    NO GROWTH < 24 HOURS Performed at [place] [address], [place], [place] [address]    Report Status PENDING  Incomplete  Blood culture (routine x 2)     Status: None (Preliminary result)   Collection Time: 01/01/22  6:49 AM   Specimen: BLOOD LEFT HAND  Result Value Ref Range Status   Specimen Description   Final    BLOOD LEFT HAND Performed at [place] [address], [place], [place] [address]    Special Requests   Final    BOTTLES DRAWN AEROBIC AND ANAEROBIC Blood Culture results may not be optimal due to an inadequate volume of blood  received in culture bottles Performed at [place] [address] [place], [place] [address]    Culture   Final    NO GROWTH < 24 HOURS Performed at [place] [address], [place],  [address]    Report Status PENDING  Incomplete  Body fluid culture w Gram Stain     Status: None (Preliminary result)   Collection Time: 01/01/22  2:20 PM   Specimen: Synovium; Body Fluid  Result Value Ref Range Status   Specimen Description   Final    SYNOVIAL RIGHT KNEE Performed at [place] [address], [place], [place] [address]    Special Requests   Final    NONE Performed at [facility], [place] [address] [place], [place] [address]    Gram Stain   Final    FEW WBC PRESENT, PREDOMINANTLY MONONUCLEAR NO ORGANISMS SEEN    Culture   Final    NO GROWTH < 24 HOURS Performed at [place] [address], [place], [place] [address]    Report Status PENDING  Incomplete     Medications:    allopurinol  300 mg Oral Daily   atorvastatin  40 mg Oral q1800   insulin aspart  0-5 Units Subcutaneous QHS   insulin aspart  0-9 Units Subcutaneous TID WC   mouth rinse  15 mL Mouth Rinse BID   pantoprazole  40 mg Oral BID   Continuous Infusions:  vancomycin 1,000 mg (01/02/22 0857)      LOS: 1 day   Charlynne Cousins  [facility]  01/02/2022, 9:57 AM

## 2022-01-03 ENCOUNTER — Inpatient Hospital Stay: Payer: Medicare Other

## 2022-01-03 ENCOUNTER — Ambulatory Visit: Payer: Medicare Other

## 2022-01-03 DIAGNOSIS — E440 Moderate protein-calorie malnutrition: Secondary | ICD-10-CM

## 2022-01-03 DIAGNOSIS — M25461 Effusion, right knee: Secondary | ICD-10-CM

## 2022-01-03 DIAGNOSIS — R6510 Systemic inflammatory response syndrome (SIRS) of non-infectious origin without acute organ dysfunction: Secondary | ICD-10-CM | POA: Diagnosis not present

## 2022-01-03 DIAGNOSIS — D469 Myelodysplastic syndrome, unspecified: Secondary | ICD-10-CM | POA: Diagnosis not present

## 2022-01-03 DIAGNOSIS — D708 Other neutropenia: Secondary | ICD-10-CM

## 2022-01-03 DIAGNOSIS — M25061 Hemarthrosis, right knee: Secondary | ICD-10-CM | POA: Diagnosis not present

## 2022-01-03 DIAGNOSIS — D696 Thrombocytopenia, unspecified: Secondary | ICD-10-CM

## 2022-01-03 LAB — GLUCOSE, CAPILLARY
Glucose-Capillary: 180 mg/dL — ABNORMAL HIGH (ref 70–99)
Glucose-Capillary: 232 mg/dL — ABNORMAL HIGH (ref 70–99)
Glucose-Capillary: 239 mg/dL — ABNORMAL HIGH (ref 70–99)
Glucose-Capillary: 269 mg/dL — ABNORMAL HIGH (ref 70–99)

## 2022-01-03 LAB — CBC
HCT: 25.6 % — ABNORMAL LOW (ref 39.0–52.0)
Hemoglobin: 8.8 g/dL — ABNORMAL LOW (ref 13.0–17.0)
MCH: 30.2 pg (ref 26.0–34.0)
MCHC: 34.4 g/dL (ref 30.0–36.0)
MCV: 88 fL (ref 80.0–100.0)
Platelets: 19 10*3/uL — CL (ref 150–400)
RBC: 2.91 MIL/uL — ABNORMAL LOW (ref 4.22–5.81)
RDW: 14.4 % (ref 11.5–15.5)
WBC: 2.2 10*3/uL — ABNORMAL LOW (ref 4.0–10.5)
nRBC: 0 % (ref 0.0–0.2)

## 2022-01-03 MED ORDER — SODIUM CHLORIDE 0.9 % IV SOLN
2.0000 g | Freq: Two times a day (BID) | INTRAVENOUS | Status: DC
  Administered 2022-01-03 – 2022-01-04 (×2): 2 g via INTRAVENOUS
  Filled 2022-01-03 (×3): qty 2

## 2022-01-03 MED ORDER — INSULIN ASPART 100 UNIT/ML IJ SOLN
4.0000 [IU] | Freq: Three times a day (TID) | INTRAMUSCULAR | Status: DC
  Administered 2022-01-03 – 2022-01-05 (×7): 4 [IU] via SUBCUTANEOUS

## 2022-01-03 MED ORDER — BACLOFEN 10 MG PO TABS
5.0000 mg | ORAL_TABLET | Freq: Three times a day (TID) | ORAL | Status: DC
  Administered 2022-01-03 – 2022-01-05 (×6): 5 mg via ORAL
  Filled 2022-01-03 (×6): qty 1

## 2022-01-03 MED ORDER — MORPHINE SULFATE (PF) 4 MG/ML IV SOLN
4.0000 mg | INTRAVENOUS | Status: DC | PRN
Start: 2022-01-03 — End: 2022-01-05
  Administered 2022-01-03: 4 mg via INTRAVENOUS
  Filled 2022-01-03: qty 1

## 2022-01-03 MED ORDER — HYDROMORPHONE HCL 1 MG/ML IJ SOLN
1.0000 mg | INTRAMUSCULAR | Status: DC | PRN
  Administered 2022-01-03: 1 mg via INTRAVENOUS
  Filled 2022-01-03: qty 1

## 2022-01-03 MED ORDER — SODIUM CHLORIDE 0.9% IV SOLUTION: Freq: Once | INTRAVENOUS | Status: AC

## 2022-01-03 MED ORDER — OXYCODONE HCL 5 MG PO TABS
15.0000 mg | ORAL_TABLET | ORAL | Status: DC | PRN
  Administered 2022-01-03 – 2022-01-04 (×2): 15 mg via ORAL
  Filled 2022-01-03 (×2): qty 3

## 2022-01-03 NOTE — Progress Notes (Addendum)
[facility] PROGRESS NOTE    Progress Note  Eugene Cooper  LGX:[id number] DOB: [birth year] DOA: 12/31/2021 PCP: Emelia Loron, NP     Brief Narrative:   Eugene Cooper is an 81 y.o. male past medical history significant for hyperlipidemia, myelodysplastic syndrome, gout pancytopenia comes in with bilateral knee pain went to his PCP and tried to have his joint aspirated but they were unable to get anything came to the ED x-ray of the knee was notable for soft tissue swelling Ortho was consulted started on IV vancomycin recommended IR consultation for guided aspiration of the right knee due to pancytopenia, they recommended treatment with IV antibiotics and prednisone and physical therapy    Assessment/Plan:   Bilateral knee pain: Orthopedic surgery was consulted recommended joint aspiration ultrasound-guided and to start IV vancomycin. IR was consulted due to thrombocytopenia who performed arthrocentesis.  He was given a unit of platelets before arthrocentesis and his platelets improved. 11 white blood cells, 60% neutrophils, no crystals.   Concerned about hemarthrosis versus infection, cultures and Gram stains are negative till date. He continues to remain afebrile Patient relates his pain is not controlled , increase his oxycodone and start him on IV morphine. Patient and family were belligerent this morning. Cont IV vanc, star iv cefepime, due to his immunosuppress state.  MDS/pancytopenia: Spoke with Dr. Annamaria Boots recommended 1 unit of platelets prior to aspiration which was done. Her platelet count have remained stable. White blood cells continues to be to. Her's hemoglobin is stable at 8.5. Platelets are improving. CBC this morning WBC 2.2  Diabetes mellitus type 2: Blood glucose fairly controlled continue sliding scale insulin.    Essential hypertension: Blood pressure is well controlled on no antihypertensive medication.  Hyperlipidemia: Continue  statins.    DVT prophylaxis: SCD Family Communication:none Status is: Inpatient  Remains inpatient appropriate because: Bilateral knee pain    Code Status:     Code Status Orders  (From admission, onward)           Start     Ordered   01/01/22 1007  Full code  Continuous        01/01/22 1006           Code Status History     Date Active Date Inactive Code Status Order ID Comments User Context   12/05/2021 1607 12/06/2021 2101 Full Code [id number]  Jonnie Finner, DO ED   10/28/2021 2054 11/10/2021 1940 Full Code [id number]  Orene Desanctis, DO ED   12/22/2019 1327 01/07/2020 1647 Full Code [id number]  Bary Leriche, PA-C Inpatient   12/19/2019 2113 12/22/2019 1307 Full Code [id number]  Bethena Roys, MD Inpatient         IV Access:   Peripheral IV   Procedures and diagnostic studies:   IR US Guide Bx Asp/Drain  Result Date: 01/01/2022 CLINICAL DATA:  Status post attempted and failed right knee joint aspiration at Urgent Care. Right knee pain, suprapatellar bursal effusion seen on previous x-ray. Request for aspiration of the effusion. EXAM: ULTRASOUND GUIDED RIGHT KNEE JOINT ASPIRATION PROCEDURE: After a thorough discussion of risks and benefits of the procedure including the general risks of bleeding, infection, damage to adjacent structures, written consent was obtained. Specific risks of this procedure included false positive and false negative results and introduction of infection into the joint. Time-out form was completed when appropriate. The right knee was examined and scanned by ultrasound. The skin was prepped and draped in the usual sterile fashion with Chloraprep.  Local anesthesia and deep anesthesia was provided with 1% lidocaine without epinephrine. Careful attention was paid not to inject bacteriostatic lidocaine into the joint. Under direct ultrasound guidance, a 20 gauge spinal needle was advanced into the suprapatellar space of the right knee joint  from a lateral approach. Aspiration was performed which yielded 8 mL of serosanguinous fluid. Fluid was sent for requested lab analysis. The needle was removed and a dressing was placed. IMPRESSION: Technically successful right knee aspiration at the level of a suprapatellar joint effusion yielding 8 mL of serosanguinous fluid. Read by: Durenda Guthrie, PA-C Electronically Signed   By: Aletta Edouard M.D.   On: 01/01/2022 14:56     Medical Consultants:   None.   Subjective:    Eugene Cooper relates his pain is not controlled has not had a bowel movement.  Objective:    Vitals:   01/02/22 1829 01/02/22 2120 01/03/22 0437 01/03/22 0800  BP:  111/68 122/71 114/64  Pulse:  100 97 98  Resp:  18 16 18   Temp: 100.1 F (37.8 C) 98.9 F (37.2 C) 98.6 F (37 C) 99.8 F (37.7 C)  TempSrc: Oral Oral Oral Oral  SpO2:  99% 98% 99%  Weight:      Height:       SpO2: 99 % O2 Flow Rate (L/min): 2 L/min   Intake/Output Summary (Last 24 hours) at 01/03/2022 0839 Last data filed at 01/03/2022 0811 Gross per 24 hour  Intake 554.34 ml  Output 925 ml  Net -370.66 ml    Filed Weights   12/31/21 1942 01/01/22 1831  Weight: 55.8 kg 55.4 kg    Exam: General exam: In no acute distress. Respiratory system: Good air movement and clear to auscultation. Cardiovascular system: S1 & S2 heard, RRR. No JVD. Gastrointestinal system: Abdomen is nondistended, soft and nontender.  Extremities: Right knee is less erythematous and swollen today still tender to touch. Skin: No rashes, lesions or ulcers Psychiatry: Judgement and insight appear normal. Mood & affect appropriate.   Data Reviewed:    Labs: Basic Metabolic Panel: Recent Labs  Lab 12/31/21 0825 01/01/22 1059 01/02/22 0514  NA 132* 133* 132*  K 3.9 3.7 3.9  CL 98 98 97*  CO2 23 26 26   GLUCOSE 288* 231* 243*  BUN 15 21 20   CREATININE 0.79 0.66 0.60*  CALCIUM 9.0 8.3* 8.3*    GFR Estimated Creatinine Clearance: 56.9 mL/min  (A) (by C-G formula based on SCr of 0.6 mg/dL (L)). Liver Function Tests: Recent Labs  Lab 12/31/21 0825 01/01/22 1059 01/02/22 0514  AST 9* 16 27  ALT 10 16 25   ALKPHOS 93 92 106  BILITOT 1.0 0.9 1.1  PROT 6.0* 5.8* 5.7*  ALBUMIN 3.5 3.0* 2.9*    No results for input(s): LIPASE, AMYLASE in the last 168 hours. No results for input(s): AMMONIA in the last 168 hours. Coagulation profile Recent Labs  Lab 01/01/22 0516  INR 1.2    COVID-19 Labs  Recent Labs    12/31/21 2108  CRP 6.8*     Lab Results  Component Value Date   SARSCOV2NAA NEGATIVE 01/01/2022   [place] NEGATIVE 12/05/2021   [place] NEGATIVE 11/29/2021   [place] NEGATIVE 10/28/2021    CBC: Recent Labs  Lab 12/28/21 0931 12/31/21 0822 01/01/22 0516 01/02/22 0514 01/02/22 1028  WBC 2.1* 1.5* 1.8* 2.0* 1.9*  NEUTROABS 0.2* 0.7* 0.6*  --  0.6*  HGB 9.3* 7.5* 9.4* 8.5* 8.3*  HCT 26.1* 21.3* 26.8* 24.8* 24.3*  MCV 84.5 84.9 86.5 87.9 87.7  PLT 12* 8* 13* 28* 24*    Cardiac Enzymes: No results for input(s): CKTOTAL, CKMB, CKMBINDEX, TROPONINI in the last 168 hours. BNP (last 3 results) No results for input(s): PROBNP in the last 8760 hours. CBG: Recent Labs  Lab 01/02/22 0731 01/02/22 1143 01/02/22 1642 01/02/22 2121 01/03/22 0758  GLUCAP 196* 186* 172* 219* 180*    D-Dimer: No results for input(s): DDIMER in the last 72 hours. Hgb A1c: No results for input(s): HGBA1C in the last 72 hours. Lipid Profile: No results for input(s): CHOL, HDL, LDLCALC, TRIG, CHOLHDL, LDLDIRECT in the last 72 hours. Thyroid function studies: No results for input(s): TSH, T4TOTAL, T3FREE, THYROIDAB in the last 72 hours.  Invalid input(s): FREET3 Anemia work up: No results for input(s): VITAMINB12, FOLATE, FERRITIN, TIBC, IRON, RETICCTPCT in the last 72 hours. Sepsis Labs: Recent Labs  Lab 12/31/21 0822 01/01/22 0516 01/02/22 0514 01/02/22 1028  WBC 1.5* 1.8* 2.0* 1.9*     Microbiology Recent Results (from the past 240 hour(s))  Culture, Urine     Status: Abnormal   Collection Time: 12/27/21  4:08 PM   Specimen: Urine, Clean Catch  Result Value Ref Range Status   Specimen Description   Final    URINE, CLEAN CATCH Performed at [facility], [address] [place], [place] [address]    Special Requests   Final    NONE Performed at [facility], [place] [address], [place], [place] [address]    Culture (A)  Final    <10,000 COLONIES/mL INSIGNIFICANT GROWTH Performed at [place] [address], [place], [place] [address]    Report Status 12/28/2021 FINAL  Final  Resp Panel by RT-PCR (Flu A&B, Covid) Nasopharyngeal Swab     Status: None   Collection Time: 01/01/22  5:16 AM   Specimen: Nasopharyngeal Swab; Nasopharyngeal(NP) swabs in vial transport medium  Result Value Ref Range Status   SARS Coronavirus 2 by RT PCR NEGATIVE NEGATIVE Final    Comment: (NOTE) SARS-CoV-2 target nucleic acids are NOT DETECTED.  The SARS-CoV-2 RNA is generally detectable in upper respiratory specimens during the acute phase of infection. The lowest concentration of SARS-CoV-2 viral copies this assay can detect is 138 copies/mL. A negative result does not preclude SARS-Cov-2 infection and should not be used as the sole basis for treatment or other patient management decisions. A negative result may occur with  improper specimen collection/handling, submission of specimen other than nasopharyngeal swab, presence of viral mutation(s) within the areas targeted by this assay, and inadequate number of viral copies(<138 copies/mL). A negative result must be combined with clinical observations, patient history, and epidemiological information. The expected result is Negative.  Fact Sheet for Patients:  EntrepreneurPulse.com.au  Fact Sheet for Healthcare Providers:   IncredibleEmployment.be  This test is no t yet approved or cleared by the [place] FDA and  has been authorized for detection and/or diagnosis of SARS-CoV-2 by FDA under an Emergency Use Authorization (EUA). This EUA will remain  in effect (meaning this test can be used) for the duration of the COVID-19 declaration under Section 564(b)(1) of the Act, 21 U.S.C.section 360bbb-3(b)(1), unless the authorization is terminated  or revoked sooner.       Influenza A by PCR NEGATIVE NEGATIVE Final   Influenza B by PCR NEGATIVE NEGATIVE Final    Comment: (NOTE) The Xpert Xpress SARS-CoV-2/FLU/RSV plus assay is intended as an aid in the diagnosis  of influenza from Nasopharyngeal swab specimens and should not be used as a sole basis for treatment. Nasal washings and aspirates are unacceptable for Xpert Xpress SARS-CoV-2/FLU/RSV testing.  Fact Sheet for Patients: EntrepreneurPulse.com.au  Fact Sheet for Healthcare Providers: IncredibleEmployment.be  This test is not yet approved or cleared by the [place] FDA and has been authorized for detection and/or diagnosis of SARS-CoV-2 by FDA under an Emergency Use Authorization (EUA). This EUA will remain in effect (meaning this test can be used) for the duration of the COVID-19 declaration under Section 564(b)(1) of the Act, 21 U.S.C. section 360bbb-3(b)(1), unless the authorization is terminated or revoked.  Performed at [facility], [place] [address] [place], [place] [address]   Blood culture (routine x 2)     Status: None (Preliminary result)   Collection Time: 01/01/22  6:27 AM   Specimen: BLOOD  Result Value Ref Range Status   Specimen Description   Final    BLOOD BLOOD LEFT HAND Performed at [place] [address], [place], [place] [address]    Special Requests   Final    BOTTLES DRAWN AEROBIC AND ANAEROBIC Blood Culture  adequate volume Performed at [place] [address] [place], [place] [address]    Culture   Final    NO GROWTH 2 DAYS Performed at [place] [address] [place], [place] [address]    Report Status PENDING  Incomplete  Blood culture (routine x 2)     Status: None (Preliminary result)   Collection Time: 01/01/22  6:49 AM   Specimen: BLOOD LEFT HAND  Result Value Ref Range Status   Specimen Description   Final    BLOOD LEFT HAND Performed at [place] [address], [place], [place] [address]    Special Requests   Final    BOTTLES DRAWN AEROBIC AND ANAEROBIC Blood Culture results may not be optimal due to an inadequate volume of blood received in culture bottles Performed at [place] [address], [place], [place] [address]    Culture   Final    NO GROWTH 2 DAYS Performed at [place] [address] [place], [place] [address]    Report Status PENDING  Incomplete  Body fluid culture w Gram Stain     Status: None (Preliminary result)   Collection Time: 01/01/22  2:20 PM   Specimen: Synovium; Body Fluid  Result Value Ref Range Status   Specimen Description   Final    SYNOVIAL RIGHT KNEE Performed at [place] [address], [place], [place] [address]    Special Requests   Final    NONE Performed at [facility], [place] [address] [place], [place] [address]    Gram Stain   Final    FEW WBC PRESENT, PREDOMINANTLY MONONUCLEAR NO ORGANISMS SEEN    Culture   Final    NO GROWTH 2 DAYS Performed at [place] [address], [place], [place] [address]    Report Status PENDING  Incomplete     Medications:    allopurinol  300 mg Oral Daily   atorvastatin  40 mg Oral q1800   insulin aspart  0-5 Units Subcutaneous QHS   insulin aspart  0-9 Units Subcutaneous TID WC   insulin aspart  2 Units Subcutaneous TID WC   mouth rinse  15  mL Mouth Rinse BID   pantoprazole  40 mg Oral BID   polyethylene glycol  17 g Oral Daily   Continuous Infusions:  vancomycin 1,000 mg (01/02/22 0857)      LOS: 2 days   Charlynne Cousins  [facility]  01/03/2022, 8:39 AM

## 2022-01-03 NOTE — Progress Notes (Signed)
Progress note: Patient resting in bed.  His family accompanies him today.  He states that he still has pain.  Objective: Patient has limited extension of the knee however at that 40 degrees.  No obvious gross spreading redness.  Distal motor and sensory function intact.  Ligaments exam unable to be performed secondary to patient factors.  Cultures as of now are no growth to date.  Cell counts were normal.  Assessment: Consult for septic arthritis.  No evidence of growth on cultures.  No surgical interventions necessary.  If cultures result there may be consideration for discussion about surgery versus suppression with antibiotics however the patient is not a surgical candidate in his current state and it would take extreme measures to consider surgery.  Patient has baseline arthritis and recommend symptomatic treatment by primary team.  Orthopedic signing off unless further interventions necessary.

## 2022-01-04 ENCOUNTER — Ambulatory Visit: Payer: Medicare Other

## 2022-01-04 ENCOUNTER — Inpatient Hospital Stay: Payer: Medicare Other

## 2022-01-04 ENCOUNTER — Other Ambulatory Visit: Payer: Medicare Other

## 2022-01-04 ENCOUNTER — Ambulatory Visit: Payer: Medicare Other | Admitting: Hematology

## 2022-01-04 DIAGNOSIS — D61818 Other pancytopenia: Secondary | ICD-10-CM

## 2022-01-04 DIAGNOSIS — D469 Myelodysplastic syndrome, unspecified: Secondary | ICD-10-CM | POA: Diagnosis not present

## 2022-01-04 DIAGNOSIS — E440 Moderate protein-calorie malnutrition: Secondary | ICD-10-CM | POA: Diagnosis not present

## 2022-01-04 DIAGNOSIS — M25461 Effusion, right knee: Secondary | ICD-10-CM | POA: Diagnosis not present

## 2022-01-04 DIAGNOSIS — M25061 Hemarthrosis, right knee: Secondary | ICD-10-CM | POA: Diagnosis not present

## 2022-01-04 DIAGNOSIS — E1165 Type 2 diabetes mellitus with hyperglycemia: Secondary | ICD-10-CM | POA: Diagnosis not present

## 2022-01-04 DIAGNOSIS — R6510 Systemic inflammatory response syndrome (SIRS) of non-infectious origin without acute organ dysfunction: Secondary | ICD-10-CM | POA: Diagnosis not present

## 2022-01-04 LAB — BASIC METABOLIC PANEL
Anion gap: 7 (ref 5–15)
BUN: 16 mg/dL (ref 8–23)
CO2: 28 mmol/L (ref 22–32)
Calcium: 8.2 mg/dL — ABNORMAL LOW (ref 8.9–10.3)
Chloride: 98 mmol/L (ref 98–111)
Creatinine, Ser: 0.61 mg/dL (ref 0.61–1.24)
GFR, Estimated: >60 mL/min (ref >60)
Glucose, Bld: 164 mg/dL — ABNORMAL HIGH (ref 70–99)
Potassium: 4 mmol/L (ref 3.5–5.1)
Sodium: 133 mmol/L — ABNORMAL LOW (ref 135–145)

## 2022-01-04 LAB — CBC WITH DIFFERENTIAL/PLATELET
Abs Immature Granulocytes: 0 10*3/uL (ref 0.00–0.07)
Basophils Absolute: 0 10*3/uL (ref 0.0–0.1)
Basophils Relative: 0 %
Blasts: 3 %
Eosinophils Absolute: 0 10*3/uL (ref 0.0–0.5)
Eosinophils Relative: 0 %
HCT: 23.7 % — ABNORMAL LOW (ref 39.0–52.0)
Hemoglobin: 8.1 g/dL — ABNORMAL LOW (ref 13.0–17.0)
Lymphocytes Relative: 53 %
Lymphs Abs: 1 10*3/uL (ref 0.7–4.0)
MCH: 30.2 pg (ref 26.0–34.0)
MCHC: 34.2 g/dL (ref 30.0–36.0)
MCV: 88.4 fL (ref 80.0–100.0)
Monocytes Absolute: 0.1 10*3/uL (ref 0.1–1.0)
Monocytes Relative: 7 %
Neutro Abs: 0.7 10*3/uL — ABNORMAL LOW (ref 1.7–7.7)
Neutrophils Relative %: 37 %
Platelets: 49 10*3/uL — ABNORMAL LOW (ref 150–400)
RBC: 2.68 MIL/uL — ABNORMAL LOW (ref 4.22–5.81)
RDW: 14.2 % (ref 11.5–15.5)
WBC: 1.9 10*3/uL — ABNORMAL LOW (ref 4.0–10.5)
nRBC: 0 % (ref 0.0–0.2)

## 2022-01-04 LAB — PREPARE PLATELET PHERESIS
Unit division: 0
Unit division: 0

## 2022-01-04 LAB — BPAM PLATELET PHERESIS
Blood Product Expiration Date: 202301282359
Blood Product Expiration Date: 202301292359
ISSUE DATE / TIME: 202301261216
ISSUE DATE / TIME: 202301261752
Unit Type and Rh: 5100
Unit Type and Rh: 7300

## 2022-01-04 LAB — GLUCOSE, CAPILLARY
Glucose-Capillary: 168 mg/dL — ABNORMAL HIGH (ref 70–99)
Glucose-Capillary: 290 mg/dL — ABNORMAL HIGH (ref 70–99)
Glucose-Capillary: 207 mg/dL — ABNORMAL HIGH (ref 70–99)
Glucose-Capillary: 151 mg/dL — ABNORMAL HIGH (ref 70–99)

## 2022-01-04 MED ORDER — COLCHICINE 0.6 MG PO TABS
0.6000 mg | ORAL_TABLET | Freq: Two times a day (BID) | ORAL | Status: DC
  Administered 2022-01-04 – 2022-01-05 (×3): 0.6 mg via ORAL
  Filled 2022-01-04 (×3): qty 1

## 2022-01-04 NOTE — Progress Notes (Signed)
Eugene Cooper   DOB:1941-08-10   SN#:[id number]   LPF#:[id number]  Hem/onc follow up note   Subjective: Patient is awake but slightly confused, his wife and niece were at the bedside.  I communicated with through the online interpreter.  He is still very weak, with bilateral knee pain, he has not been able to get out of bed.  His family complains of no food was ordered for lunch and dinner, he still has his breakfast on table which he even did not touch. He states he is hunger.  I encouraged his family member to bring food to him if he does not like hospital food.   Objective:  Vitals:   01/04/22 0347 01/04/22 1355  BP: 117/64 100/61  Pulse: (!) 102 99  Resp: 18 18  Temp: 97.6 F (36.4 C) 99.7 F (37.6 C)  SpO2: 100% 98%    Body mass index is 22.34 kg/m.  Intake/Output Summary (Last 24 hours) at 01/04/2022 1858 Last data filed at 01/04/2022 0500 Gross per 24 hour  Intake 675 ml  Output --  Net 675 ml     Sclerae unicteric  Oropharynx clear  No peripheral adenopathy  Lungs clear -- no rales or rhonchi  Heart regular rate and rhythm  Abdomen benign  Mild edema and skin erythema of bilateral knee  Neuro nonfocal    CBG (last 3)  Recent Labs    01/04/22 0803 01/04/22 1205 01/04/22 1613  GLUCAP 168* 290* 207*     Labs:   Urine Studies No results for input(s): UHGB, CRYS in the last 72 hours.  Invalid input(s): UACOL, UAPR, USPG, UPH, UTP, UGL, UKET, UBIL, UNIT, UROB, ULEU, UEPI, UWBC, URBC, [place], [place]  Basic Metabolic Panel: Recent Labs  Lab 12/31/21 0825 01/01/22 1059 01/02/22 0514 01/04/22 0522  NA 132* 133* 132* 133*  K 3.9 3.7 3.9 4.0  CL 98 98 97* 98  CO2 23 26 26 28   GLUCOSE 288* 231* 243* 164*  BUN 15 21 20 16   CREATININE 0.79 0.66 0.60* 0.61  CALCIUM 9.0 8.3* 8.3* 8.2*   GFR Estimated Creatinine Clearance: 56.9 mL/min (by C-G formula based on SCr of 0.61 mg/dL). Liver Function Tests: Recent Labs  Lab 12/31/21 0825  01/01/22 1059 01/02/22 0514  AST 9* 16 27  ALT 10 16 25   ALKPHOS 93 92 106  BILITOT 1.0 0.9 1.1  PROT 6.0* 5.8* 5.7*  ALBUMIN 3.5 3.0* 2.9*   No results for input(s): LIPASE, AMYLASE in the last 168 hours. No results for input(s): AMMONIA in the last 168 hours. Coagulation profile Recent Labs  Lab 01/01/22 0516  INR 1.2    CBC: Recent Labs  Lab 12/31/21 0822 01/01/22 0516 01/02/22 0514 01/02/22 1028 01/03/22 0909 01/04/22 0902  WBC 1.5* 1.8* 2.0* 1.9* 2.2* 1.9*  NEUTROABS 0.7* 0.6*  --  0.6*  --  0.7*  HGB 7.5* 9.4* 8.5* 8.3* 8.8* 8.1*  HCT 21.3* 26.8* 24.8* 24.3* 25.6* 23.7*  MCV 84.9 86.5 87.9 87.7 88.0 88.4  PLT 8* 13* 28* 24* 19* 49*   Cardiac Enzymes: No results for input(s): CKTOTAL, CKMB, CKMBINDEX, TROPONINI in the last 168 hours. BNP: Invalid input(s): POCBNP CBG: Recent Labs  Lab 01/03/22 1649 01/03/22 2104 01/04/22 0803 01/04/22 1205 01/04/22 1613  GLUCAP 239* 269* 168* 290* 207*   D-Dimer No results for input(s): DDIMER in the last 72 hours. Hgb A1c No results for input(s): HGBA1C in the last 72 hours. Lipid Profile No results for input(s): CHOL, HDL,  LDLCALC, TRIG, CHOLHDL, LDLDIRECT in the last 72 hours. Thyroid function studies No results for input(s): TSH, T4TOTAL, T3FREE, THYROIDAB in the last 72 hours.  Invalid input(s): FREET3 Anemia work up No results for input(s): VITAMINB12, FOLATE, FERRITIN, TIBC, IRON, RETICCTPCT in the last 72 hours. Microbiology Recent Results (from the past 240 hour(s))  Culture, Urine     Status: Abnormal   Collection Time: 12/27/21  4:08 PM   Specimen: Urine, Clean Catch  Result Value Ref Range Status   Specimen Description   Final    URINE, CLEAN CATCH Performed at [facility], [address] [place], [place] [address]    Special Requests   Final    NONE Performed at [facility], [place] [address], [place], [place] [address]    Culture (A)  Final     <10,000 COLONIES/mL INSIGNIFICANT GROWTH Performed at [place] [address] [place], [place] [address]    Report Status 12/28/2021 FINAL  Final  Resp Panel by RT-PCR (Flu A&B, Covid) Nasopharyngeal Swab     Status: None   Collection Time: 01/01/22  5:16 AM   Specimen: Nasopharyngeal Swab; Nasopharyngeal(NP) swabs in vial transport medium  Result Value Ref Range Status   SARS Coronavirus 2 by RT PCR NEGATIVE NEGATIVE Final    Comment: (NOTE) SARS-CoV-2 target nucleic acids are NOT DETECTED.  The SARS-CoV-2 RNA is generally detectable in upper respiratory specimens during the acute phase of infection. The lowest concentration of SARS-CoV-2 viral copies this assay can detect is 138 copies/mL. A negative result does not preclude SARS-Cov-2 infection and should not be used as the sole basis for treatment or other patient management decisions. A negative result may occur with  improper specimen collection/handling, submission of specimen other than nasopharyngeal swab, presence of viral mutation(s) within the areas targeted by this assay, and inadequate number of viral copies(<138 copies/mL). A negative result must be combined with clinical observations, patient history, and epidemiological information. The expected result is Negative.  Fact Sheet for Patients:  EntrepreneurPulse.com.au  Fact Sheet for Healthcare Providers:  IncredibleEmployment.be  This test is no t yet approved or cleared by the [place] FDA and  has been authorized for detection and/or diagnosis of SARS-CoV-2 by FDA under an Emergency Use Authorization (EUA). This EUA will remain  in effect (meaning this test can be used) for the duration of the COVID-19 declaration under Section 564(b)(1) of the Act, 21 U.S.C.section 360bbb-3(b)(1), unless the authorization is terminated  or revoked sooner.       Influenza A by PCR NEGATIVE NEGATIVE Final   Influenza B  by PCR NEGATIVE NEGATIVE Final    Comment: (NOTE) The Xpert Xpress SARS-CoV-2/FLU/RSV plus assay is intended as an aid in the diagnosis of influenza from Nasopharyngeal swab specimens and should not be used as a sole basis for treatment. Nasal washings and aspirates are unacceptable for Xpert Xpress SARS-CoV-2/FLU/RSV testing.  Fact Sheet for Patients: EntrepreneurPulse.com.au  Fact Sheet for Healthcare Providers: IncredibleEmployment.be  This test is not yet approved or cleared by the [place] FDA and has been authorized for detection and/or diagnosis of SARS-CoV-2 by FDA under an Emergency Use Authorization (EUA). This EUA will remain in effect (meaning this test can be used) for the duration of the COVID-19 declaration under Section 564(b)(1) of the Act, 21 U.S.C. section 360bbb-3(b)(1), unless the authorization is terminated or revoked.  Performed at [facility], [place] [address], [place], [place] [address]  Blood culture (routine x 2)     Status: None (Preliminary result)   Collection Time: 01/01/22  6:27 AM   Specimen: BLOOD  Result Value Ref Range Status   Specimen Description   Final    BLOOD BLOOD LEFT HAND Performed at [place] [address], [place], [place] [address]    Special Requests   Final    BOTTLES DRAWN AEROBIC AND ANAEROBIC Blood Culture adequate volume Performed at [place] [address] [place], [place] [address]    Culture   Final    NO GROWTH 3 DAYS Performed at [facility], [place] [address] [place], [place] [address]    Report Status PENDING  Incomplete  Blood culture (routine x 2)     Status: None (Preliminary result)   Collection Time: 01/01/22  6:49 AM   Specimen: BLOOD LEFT HAND  Result Value Ref Range Status   Specimen Description   Final    BLOOD LEFT HAND Performed at [facility] [place] [address],  [place], [place] [address]    Special Requests   Final    BOTTLES DRAWN AEROBIC AND ANAEROBIC Blood Culture results may not be optimal due to an inadequate volume of blood received in culture bottles Performed at [facility] [address] [place], [place] [address]    Culture   Final    NO GROWTH 3 DAYS Performed at [facility], [place] [address] [place], [place] [address]    Report Status PENDING  Incomplete  Body fluid culture w Gram Stain     Status: None (Preliminary result)   Collection Time: 01/01/22  2:20 PM   Specimen: Synovium; Body Fluid  Result Value Ref Range Status   Specimen Description   Final    SYNOVIAL RIGHT KNEE Performed at [facility] [address] [place], [place] [address]    Special Requests   Final    NONE Performed at [facility], [place] [address] [place], [place] [address]    Gram Stain   Final    FEW WBC PRESENT, PREDOMINANTLY MONONUCLEAR NO ORGANISMS SEEN    Culture   Final    NO GROWTH 3 DAYS Performed at [place] [address], [place], [place] [address]    Report Status PENDING  Incomplete      Studies:  No results found.  Assessment: 81 y.o. 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  ID on board, and antibiotics has been stopped due to lack of definitive evidence of infection. -I spoke with his nurse to order food for him.  I also encouraged family members to bring food to him if he does not like hospital food. -He was evaluated by physical therapist, home PT was  recommended -from MDS standpoint, he has poor prognosis, he received 1 cycle chemotherapy, although it's too early to tell if this is working or not, he is clinically not doing well, I am not sure if he can tolerate more chemo. His wife and niece do not seem to understand much about this when  I explained to them, I will talk to his son and daughter next week, or on next clinic visit.  -discharge per primary team.    Truitt Merle, MD 01/04/2022  6:58 PM

## 2022-01-04 NOTE — Consult Note (Signed)
[place] for Infectious Disease       Reason for Consult: fever    Referring Physician: Dr. Aileen Fass  Principal Problem:   SIRS (systemic inflammatory response syndrome) (HCC) Active Problems:   Uncontrolled type 2 diabetes mellitus with hyperglycemia, without long-term current use of insulin (HCC)   Pancytopenia (HCC)   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HCC)    allopurinol  300 mg Oral Daily   atorvastatin  40 mg Oral q1800   baclofen  5 mg Oral TID   colchicine  0.6 mg Oral BID   insulin aspart  0-5 Units Subcutaneous QHS   insulin aspart  0-9 Units Subcutaneous TID WC   insulin aspart  4 Units Subcutaneous TID WC   mouth rinse  15 mL Mouth Rinse BID   pantoprazole  40 mg Oral BID   polyethylene glycol  17 g Oral Daily    Recommendations: Continue with supportive care Will stop antibiotics  Assessment: He has a fever but no obvious sign of bacterial infection noted.  Knee aspiration with minimal inflammation and not c/w infectious process.  No other noted concerns for infectious source on exam or history.  Fever may be related to MDS, which was his initial presentation of MDS (FUO) or gout, though crystals negative on aspiration.  Would be reasonable to consider anti-inflammatory medication and I see he is now on colchicine.    I will sign off, call with any questions.   Antibiotics: Day 5 total antibiotics  HPI: Lorena Clearman is a 81 y.o. 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  He endorses some shortness of breath with activity, no diarrhea, no rash.  Headache x 1 day.  Tmax 100.8.  Pancytopenic.  Wife and son at bedside.  Had aspiration of the knee with no significant WBCs, no crystals seen.  No growth on culture.    Review of Systems:  Constitutional: positive for fevers, chills, fatigue, malaise, and anorexia or negative for sweats Positive for  headache Respiratory: positive for dyspnea on exertion, negative for cough or sputum Gastrointestinal: negative for nausea and diarrhea Integument/breast: negative for rash Musculoskeletal: positive for bilateral knee pain All other systems reviewed and are negative    Past Medical History:  Diagnosis Date   CKD (chronic kidney disease), stage III (HCC)    Diabetes (HCC)    Hyperlipemia    Hypertension    LBBB (left bundle branch block)    Stroke ([place])     Social History   Tobacco Use   Smoking status: Never   Smokeless tobacco: Never  Vaping Use   Vaping Use: Never used  Substance Use Topics   Alcohol use: Not Currently   Drug use: Never    Family History  Problem Relation Age of Onset   Stroke Mother    Lung disease Father    Diabetes Sister    Heart disease Sister    Hyperlipidemia Son    Hyperlipidemia Daughter    Hypertension Daughter     No Known Allergies  Physical Exam: Constitutional: ill-appearing   Vitals:   01/04/22 0347 01/04/22 1355  BP: 117/64 100/61  Pulse: (!) 102 99  Resp: 18 18  Temp: 97.6 F (36.4 C) 99.7 F (37.6 C)  SpO2: 100% 98%   EYES: anicteric ENMT: no thrush Cardiovascular: tachy RR Respiratory: increased respiratory effort; CTA B GI: soft Musculoskeletal: bilateral knees without effusion, some  mild warmth bilateral Skin: no rash  Lab Results  Component Value Date   WBC 1.9 (L) 01/04/2022   HGB 8.1 (L) 01/04/2022   HCT 23.7 (L) 01/04/2022   MCV 88.4 01/04/2022   PLT 49 (L) 01/04/2022    Lab Results  Component Value Date   CREATININE 0.61 01/04/2022   BUN 16 01/04/2022   NA 133 (L) 01/04/2022   K 4.0 01/04/2022   CL 98 01/04/2022   CO2 28 01/04/2022    Lab Results  Component Value Date   ALT 25 01/02/2022   AST 27 01/02/2022   ALKPHOS 106 01/02/2022     Microbiology: Recent Results (from the past 240 hour(s))  Culture, Urine     Status: Abnormal   Collection Time: 12/27/21  4:08 PM   Specimen: Urine,  Clean Catch  Result Value Ref Range Status   Specimen Description   Final    URINE, CLEAN CATCH Performed at [facility], [place] [address] [place], [place] [address]    Special Requests   Final    NONE Performed at [facility], [place] [address] [place], [place] [address]    Culture (A)  Final    <10,000 COLONIES/mL INSIGNIFICANT GROWTH Performed at [facility] [place] [address] [place], [place] [address]    Report Status 12/28/2021 FINAL  Final  Resp Panel by RT-PCR (Flu A&B, Covid) Nasopharyngeal Swab     Status: None   Collection Time: 01/01/22  5:16 AM   Specimen: Nasopharyngeal Swab; Nasopharyngeal(NP) swabs in vial transport medium  Result Value Ref Range Status   SARS Coronavirus 2 by RT PCR NEGATIVE NEGATIVE Final    Comment: (NOTE) SARS-CoV-2 target nucleic acids are NOT DETECTED.  The SARS-CoV-2 RNA is generally detectable in upper respiratory specimens during the acute phase of infection. The lowest concentration of SARS-CoV-2 viral copies this assay can detect is 138 copies/mL. A negative result does not preclude SARS-Cov-2 infection and should not be used as the sole basis for treatment or other patient management decisions. A negative result may occur with  improper specimen collection/handling, submission of specimen other than nasopharyngeal swab, presence of viral mutation(s) within the areas targeted by this assay, and inadequate number of viral copies(<138 copies/mL). A negative result must be combined with clinical observations, patient history, and epidemiological information. The expected result is Negative.  Fact Sheet for Patients:  EntrepreneurPulse.com.au  Fact Sheet for Healthcare Providers:  IncredibleEmployment.be  This test is no t yet approved or cleared by the [place] FDA and  has been authorized for detection and/or diagnosis of SARS-CoV-2 by FDA  under an Emergency Use Authorization (EUA). This EUA will remain  in effect (meaning this test can be used) for the duration of the COVID-19 declaration under Section 564(b)(1) of the Act, 21 U.S.C.section 360bbb-3(b)(1), unless the authorization is terminated  or revoked sooner.       Influenza A by PCR NEGATIVE NEGATIVE Final   Influenza B by PCR NEGATIVE NEGATIVE Final    Comment: (NOTE) The Xpert Xpress SARS-CoV-2/FLU/RSV plus assay is intended as an aid in the diagnosis of influenza from Nasopharyngeal swab specimens and should not be used as a sole basis for treatment. Nasal washings and aspirates are unacceptable for Xpert Xpress SARS-CoV-2/FLU/RSV testing.  Fact Sheet for Patients: EntrepreneurPulse.com.au  Fact Sheet for Healthcare Providers: IncredibleEmployment.be  This test is not yet approved or cleared by the [place] FDA and has been authorized for detection  and/or diagnosis of SARS-CoV-2 by FDA under an Emergency Use Authorization (EUA). This EUA will remain in effect (meaning this test can be used) for the duration of the COVID-19 declaration under Section 564(b)(1) of the Act, 21 U.S.C. section 360bbb-3(b)(1), unless the authorization is terminated or revoked.  Performed at [facility], [place] [address], [place], [place] [address]   Blood culture (routine x 2)     Status: None (Preliminary result)   Collection Time: 01/01/22  6:27 AM   Specimen: BLOOD  Result Value Ref Range Status   Specimen Description   Final    BLOOD BLOOD LEFT HAND Performed at [facility] [address], [place], [place] [address]    Special Requests   Final    BOTTLES DRAWN AEROBIC AND ANAEROBIC Blood Culture adequate volume Performed at [facility] [address], [place], [place] [address]    Culture   Final    NO GROWTH 3 DAYS Performed at [facility], [place] [address] [place], [place] [address]    Report Status PENDING  Incomplete  Blood culture (routine x 2)     Status: None (Preliminary result)   Collection Time: 01/01/22  6:49 AM   Specimen: BLOOD LEFT HAND  Result Value Ref Range Status   Specimen Description   Final    BLOOD LEFT HAND Performed at [facility] [address], [place], [place] [address]    Special Requests   Final    BOTTLES DRAWN AEROBIC AND ANAEROBIC Blood Culture results may not be optimal due to an inadequate volume of blood received in culture bottles Performed at [facility] [address] [place], [place] [address]    Culture   Final    NO GROWTH 3 DAYS Performed at [facility], [place] [address], [place], [place] [address]    Report Status PENDING  Incomplete  Body fluid culture w Gram Stain     Status: None (Preliminary result)   Collection Time: 01/01/22  2:20 PM   Specimen: Synovium; Body Fluid  Result Value Ref Range Status   Specimen Description   Final    SYNOVIAL RIGHT KNEE Performed at [facility] [address] [place], [place] [address]    Special Requests   Final    NONE Performed at [facility], [place] [address] [place], [place] [address]    Gram Stain   Final    FEW WBC PRESENT, PREDOMINANTLY MONONUCLEAR NO ORGANISMS SEEN    Culture   Final    NO GROWTH 3 DAYS Performed at [place] [address], [place],  [address]    Report Status PENDING  Incomplete    Thayer Headings, [place] for Infectious Disease [place] [email or web address] 01/04/2022, 3:01 PM

## 2022-01-04 NOTE — Evaluation (Signed)
Physical Therapy Evaluation Patient Details Name: Eugene Cooper MRN: [id number] DOB: 28-Oct-1941 Today's Date: 01/04/2022  History of Present Illness  Eugene Cooper is an 81 y.o. male comes in with bilateral knee pain. Pt admitted 1/23 with SIRS. Successful image-guided right suprapatellar effusion aspiration 1/24. PMH: hyperlipidemia, myelodysplastic syndrome, gout pancytopenia   Clinical Impression  Pt admitted with above diagnosis. Pt from home with family, using RW for limited household distances, requires assist with 2 steps to enter home for ~4 months, family assisting with self care tasks as needed and completing all household chores. Pt significantly limited by pain with mobility, requires significantly increased time and assist with bed mobility, +2 to sit EOB due to RLE pain, max A to return to sidelying. Pt prefers to maintain knee at ~45 deg flexion and hip external rotation with abduction, resistant to extension so not attempted; trace quad noted but limited by pain. Pt initially declines standing due to pain, then requests BSC for BM, unable to make it in time so assisted back to sidelying and notified RN and NT per pt and family request. Pt denies numbness/tingling throughout entire RLE, reports pain mostly in medial knee joint. Pt's son reports plan is to bring pt home and have someone there 24/7 to assist pt; if family unable to provide current level of assist, may need to consider SNF. Pt currently with functional limitations due to the deficits listed below (see PT Problem List). Pt will benefit from skilled PT to increase their independence and safety with mobility to allow discharge to the venue listed below.          Recommendations for follow up therapy are one component of a multi-disciplinary discharge planning process, led by the attending physician.  Recommendations may be updated based on patient status, additional functional criteria and insurance  authorization.  Follow Up Recommendations Home health PT (if 24/7 assist at home is available)    Assistance Recommended at Discharge Frequent or constant Supervision/Assistance  Patient can return home with the following  Two people to help with walking and/or transfers;Two people to help with bathing/dressing/bathroom;Assistance with cooking/housework;Assist for transportation;Help with stairs or ramp for entrance    Equipment Recommendations None recommended by PT  Recommendations for Other Services       Functional Status Assessment       Precautions / Restrictions Precautions Precautions: Fall Precaution Comments: high pain in R knee Restrictions Weight Bearing Restrictions: No      Mobility  Bed Mobility Overal bed mobility: Needs Assistance Bed Mobility: Sidelying to Sit, Sit to Sidelying  Sidelying to sit: Max assist, +2 for physical assistance  Sit to sidelying: Max assist General bed mobility comments: therapist educated pt on reaching for bedrail to assist in uprighting trunk, ultimately requires +2 to sit EOB for trunk uprighting and RLE management due to pain through sidelying, significantly increased time; max A to return to sidelying    Transfers  General transfer comment: pt declines due to pain initially, then agreeable to get to [facility] for BM but unable to make it in time; assisted back to supine and notified RN and NT    Ambulation/Gait   Stairs   Wheelchair Mobility    Modified Rankin (Stroke Patients Only)       Balance Overall balance assessment: Needs assistance Sitting-balance support: Feet supported Sitting balance-Leahy Scale: Fair Sitting balance - Comments: sitting EOB       Pertinent Vitals/Pain Pain Assessment Pain Assessment: Faces Faces Pain Scale: Hurts whole lot Pain  Location: R knee, more medial side Pain Descriptors / Indicators: Discomfort, Grimacing, Guarding, Moaning Pain Intervention(s): Limited activity within  patient's tolerance, Monitored during session, Repositioned    Home Living Family/patient expects to be discharged to:: Private residence Living Arrangements: Children;Spouse/significant other Available Help at Discharge: Family;Available 24 hours/day Type of Home: House Home Access: Stairs to enter   [employer] of Steps: 2   Home Layout: One level Home Equipment: Conservation officer, nature (2 wheels);Cane - single point;Shower seat;Wheelchair - manual;Hand held shower head Additional Comments: son's house information listed    Prior Function Prior Level of Function : Needs assist  Physical Assist : Mobility (physical);ADLs (physical)  Mobility Comments: family assists with 2 steps to enter home, ambulates with RW to restroom then back to sitting down ADLs Comments: family assists with self care, family completes household chores     Hand Dominance        Extremity/Trunk Assessment   Upper Extremity Assessment Upper Extremity Assessment: Overall WFL for tasks assessed    Lower Extremity Assessment Lower Extremity Assessment: RLE deficits/detail;LLE deficits/detail;Generalized weakness RLE Deficits / Details: quad 1/5, pt resting in ~45 deg knee flexion and significant hip external rotation and abduction, too painful to extend and pt resistant so not attempted, ankle and toes mobilizing, denies numbness/tingling throughout entire LE RLE: Unable to fully assess due to pain RLE Sensation: WNL LLE Deficits / Details: AROM WNL throughout, strength grossly 3/5 LLE Sensation: WNL       Communication   Communication: Prefers language other than [place];Interpreter utilized Verdis Frederickson [phone])  Cognition Arousal/Alertness: Awake/alert Behavior During Therapy: WFL for tasks assessed/performed Overall Cognitive Status: Difficult to assess  General Comments: pt occasionally requires increased time to answer questions or doesn't respond to interpreter requiring son to prompt pt to  answer        General Comments      Exercises     Assessment/Plan    PT Assessment Patient needs continued PT services  PT Problem List Decreased strength;Decreased range of motion;Decreased activity tolerance;Decreased balance;Decreased mobility;Pain       PT Treatment Interventions DME instruction;Gait training;Stair training;Functional mobility training;Therapeutic activities;Therapeutic exercise;Balance training;Patient/family education    PT Goals (Current goals can be found in the Care Plan section)  Acute Rehab PT Goals Patient Stated Goal: son reports goal is to bring pt home PT Goal Formulation: With patient/family Time For Goal Achievement: 01/18/22 Potential to Achieve Goals: Good    Frequency Min 3X/week     Co-evaluation               AM-PAC PT "6 Clicks" Mobility  Outcome Measure Help needed turning from your back to your side while in a flat bed without using bedrails?: Total Help needed moving from lying on your back to sitting on the side of a flat bed without using bedrails?: Total Help needed moving to and from a bed to a chair (including a wheelchair)?: Total Help needed standing up from a chair using your arms (e.g., wheelchair or bedside chair)?: Total Help needed to walk in hospital room?: Total Help needed climbing 3-5 steps with a railing? : Total 6 Click Score: 6    End of Session Equipment Utilized During Treatment: Oxygen Activity Tolerance: Patient limited by pain Patient left: in bed;with call bell/phone within reach;with nursing/sitter in room;with family/visitor present Nurse Communication: Mobility status;Other (comment) (BM) PT Visit Diagnosis: Other abnormalities of gait and mobility (R26.89);Muscle weakness (generalized) (M62.81);Difficulty in walking, not elsewhere classified (R26.2);Pain Pain - Right/Left: Right Pain -  part of body: Knee    Time: 9030-1499 PT Time Calculation (min) (ACUTE ONLY): 25 min   Charges:   PT  Evaluation $PT Eval Moderate Complexity: 1 Mod           Tori Hendricks Schwandt PT, DPT 01/04/22, 3:18 PM

## 2022-01-04 NOTE — Progress Notes (Signed)
°  Transition of Care [facility]) Screening Note   Patient Details  Name: Eugene Cooper Date of Birth: 1941/05/23   Transition of Care [facility]) CM/SW Contact:    Marguerita Stapp, Marjie Skiff, RN Phone Number: 01/04/2022, 1:50 PM    Transition of Care Department [facility]) has reviewed patient and no TOC needs have been identified at this time. We will continue to monitor patient advancement through interdisciplinary progression rounds. If new patient transition needs arise, please place a TOC consult.

## 2022-01-04 NOTE — Progress Notes (Signed)
[facility] PROGRESS NOTE    Progress Note  Eugene Cooper  KKX:[id number] DOB: [birth year] DOA: 12/31/2021 PCP: Emelia Loron, NP     Brief Narrative:   Eugene Cooper is an 81 y.o. male past medical history significant for hyperlipidemia, myelodysplastic syndrome, gout pancytopenia comes in with bilateral knee pain went to his PCP and tried to have his joint aspirated but they were unable to get anything came to the ED x-ray of the knee was notable for soft tissue swelling Ortho was consulted started on IV vancomycin recommended IR consultation for guided aspiration of the right knee due to pancytopenia, they recommended treatment with IV antibiotics and prednisone and physical therapy    Assessment/Plan:   Bilateral knee pain: Orthopedic surgery was consulted recommended joint aspiration ultrasound-guided and to start IV vancomycin and cefepime. Synovial fluid showed 11 white blood cells, 60% neutrophils, no crystals.   Concerned about hemarthrosis versus infection, cultures and Gram stains are negative till date. He continues to remain afebrile Pain is controlled. Will consult ID for assistance on antibiotic coverage.  MDS/pancytopenia: Spoke with Dr. Annamaria Boots recommended 1 unit of platelets prior to aspiration which was done. Her platelet count have remained stable. CBC with differential is pending this morning.  Diabetes mellitus type 2: Blood glucose fairly controlled continue sliding scale insulin.    Essential hypertension: Blood pressure is well controlled on no antihypertensive medication.  Hyperlipidemia: Continue statins.    DVT prophylaxis: SCD Family Communication:none Status is: Inpatient  Remains inpatient appropriate because: Bilateral knee pain    Code Status:     Code Status Orders  (From admission, onward)           Start     Ordered   01/01/22 1007  Full code  Continuous        01/01/22 1006           Code Status History      Date Active Date Inactive Code Status Order ID Comments User Context   12/05/2021 1607 12/06/2021 2101 Full Code [id number]  Jonnie Finner, DO ED   10/28/2021 2054 11/10/2021 1940 Full Code [id number]  Orene Desanctis, DO ED   12/22/2019 1327 01/07/2020 1647 Full Code [id number]  Bary Leriche, PA-C Inpatient   12/19/2019 2113 12/22/2019 1307 Full Code [id number]  Bethena Roys, MD Inpatient         IV Access:   Peripheral IV   Procedures and diagnostic studies:   No results found.   Medical Consultants:   None.   Subjective:    Eugene Cooper pain is controlled.  Objective:    Vitals:   01/03/22 1800 01/03/22 1815 01/03/22 2101 01/04/22 0347  BP: 123/70 125/69 110/65 117/64  Pulse: 88 86 96 (!) 102  Resp: 20 20 19 18   Temp: 98.4 F (36.9 C) 98.3 F (36.8 C) 98.6 F (37 C) 97.6 F (36.4 C)  TempSrc: Oral Oral Oral Oral  SpO2:  100% 100% 100%  Weight:      Height:       SpO2: 100 % O2 Flow Rate (L/min): 2 L/min FiO2 (%): 21 %   Intake/Output Summary (Last 24 hours) at 01/04/2022 0831 Last data filed at 01/04/2022 0500 Gross per 24 hour  Intake 2050.46 ml  Output --  Net 2050.46 ml    Filed Weights   12/31/21 1942 01/01/22 1831  Weight: 55.8 kg 55.4 kg    Exam: General exam: In no acute distress. Respiratory system: Good air movement and  clear to auscultation. Cardiovascular system: S1 & S2 heard, RRR. No JVD. Gastrointestinal system: Abdomen is nondistended, soft and nontender.  Extremities: No pedal edema. Skin: No rashes, lesions or ulcers Psychiatry: Judgement and insight appear normal. Mood & affect appropriate.   Data Reviewed:    Labs: Basic Metabolic Panel: Recent Labs  Lab 12/31/21 0825 01/01/22 1059 01/02/22 0514 01/04/22 0522  NA 132* 133* 132* 133*  K 3.9 3.7 3.9 4.0  CL 98 98 97* 98  CO2 23 26 26 28   GLUCOSE 288* 231* 243* 164*  BUN 15 21 20 16   CREATININE 0.79 0.66 0.60* 0.61  CALCIUM 9.0 8.3* 8.3* 8.2*     GFR Estimated Creatinine Clearance: 56.9 mL/min (by C-G formula based on SCr of 0.61 mg/dL). Liver Function Tests: Recent Labs  Lab 12/31/21 0825 01/01/22 1059 01/02/22 0514  AST 9* 16 27  ALT 10 16 25   ALKPHOS 93 92 106  BILITOT 1.0 0.9 1.1  PROT 6.0* 5.8* 5.7*  ALBUMIN 3.5 3.0* 2.9*    No results for input(s): LIPASE, AMYLASE in the last 168 hours. No results for input(s): AMMONIA in the last 168 hours. Coagulation profile Recent Labs  Lab 01/01/22 0516  INR 1.2    COVID-19 Labs  No results for input(s): DDIMER, FERRITIN, LDH, CRP in the last 72 hours.   Lab Results  Component Value Date   SARSCOV2NAA NEGATIVE 01/01/2022   [place] NEGATIVE 12/05/2021   [place] NEGATIVE 11/29/2021   [place] NEGATIVE 10/28/2021    CBC: Recent Labs  Lab 12/28/21 0931 12/31/21 0822 01/01/22 0516 01/02/22 0514 01/02/22 1028 01/03/22 0909  WBC 2.1* 1.5* 1.8* 2.0* 1.9* 2.2*  NEUTROABS 0.2* 0.7* 0.6*  --  0.6*  --   HGB 9.3* 7.5* 9.4* 8.5* 8.3* 8.8*  HCT 26.1* 21.3* 26.8* 24.8* 24.3* 25.6*  MCV 84.5 84.9 86.5 87.9 87.7 88.0  PLT 12* 8* 13* 28* 24* 19*    Cardiac Enzymes: No results for input(s): CKTOTAL, CKMB, CKMBINDEX, TROPONINI in the last 168 hours. BNP (last 3 results) No results for input(s): PROBNP in the last 8760 hours. CBG: Recent Labs  Lab 01/03/22 0758 01/03/22 1157 01/03/22 1649 01/03/22 2104 01/04/22 0803  GLUCAP 180* 232* 239* 269* 168*    D-Dimer: No results for input(s): DDIMER in the last 72 hours. Hgb A1c: No results for input(s): HGBA1C in the last 72 hours. Lipid Profile: No results for input(s): CHOL, HDL, LDLCALC, TRIG, CHOLHDL, LDLDIRECT in the last 72 hours. Thyroid function studies: No results for input(s): TSH, T4TOTAL, T3FREE, THYROIDAB in the last 72 hours.  Invalid input(s): FREET3 Anemia work up: No results for input(s): VITAMINB12, FOLATE, FERRITIN, TIBC, IRON, RETICCTPCT in the last 72 hours. Sepsis  Labs: Recent Labs  Lab 01/01/22 0516 01/02/22 0514 01/02/22 1028 01/03/22 0909  WBC 1.8* 2.0* 1.9* 2.2*    Microbiology Recent Results (from the past 240 hour(s))  Culture, Urine     Status: Abnormal   Collection Time: 12/27/21  4:08 PM   Specimen: Urine, Clean Catch  Result Value Ref Range Status   Specimen Description   Final    URINE, CLEAN CATCH Performed at [facility], [place] [address] [place], [place] [address]    Special Requests   Final    NONE Performed at [facility], [place] [address] [place], [place] [address]    Culture (A)  Final    <10,000 COLONIES/mL INSIGNIFICANT GROWTH Performed at [facility]  [address], [place], [place] [address]    Report Status 12/28/2021 FINAL  Final  Resp Panel by RT-PCR (Flu A&B, Covid) Nasopharyngeal Swab     Status: None   Collection Time: 01/01/22  5:16 AM   Specimen: Nasopharyngeal Swab; Nasopharyngeal(NP) swabs in vial transport medium  Result Value Ref Range Status   SARS Coronavirus 2 by RT PCR NEGATIVE NEGATIVE Final    Comment: (NOTE) SARS-CoV-2 target nucleic acids are NOT DETECTED.  The SARS-CoV-2 RNA is generally detectable in upper respiratory specimens during the acute phase of infection. The lowest concentration of SARS-CoV-2 viral copies this assay can detect is 138 copies/mL. A negative result does not preclude SARS-Cov-2 infection and should not be used as the sole basis for treatment or other patient management decisions. A negative result may occur with  improper specimen collection/handling, submission of specimen other than nasopharyngeal swab, presence of viral mutation(s) within the areas targeted by this assay, and inadequate number of viral copies(<138 copies/mL). A negative result must be combined with clinical observations, patient history, and epidemiological information. The expected result is Negative.  Fact Sheet for Patients:   EntrepreneurPulse.com.au  Fact Sheet for Healthcare Providers:  IncredibleEmployment.be  This test is no t yet approved or cleared by the [place] FDA and  has been authorized for detection and/or diagnosis of SARS-CoV-2 by FDA under an Emergency Use Authorization (EUA). This EUA will remain  in effect (meaning this test can be used) for the duration of the COVID-19 declaration under Section 564(b)(1) of the Act, 21 U.S.C.section 360bbb-3(b)(1), unless the authorization is terminated  or revoked sooner.       Influenza A by PCR NEGATIVE NEGATIVE Final   Influenza B by PCR NEGATIVE NEGATIVE Final    Comment: (NOTE) The Xpert Xpress SARS-CoV-2/FLU/RSV plus assay is intended as an aid in the diagnosis of influenza from Nasopharyngeal swab specimens and should not be used as a sole basis for treatment. Nasal washings and aspirates are unacceptable for Xpert Xpress SARS-CoV-2/FLU/RSV testing.  Fact Sheet for Patients: EntrepreneurPulse.com.au  Fact Sheet for Healthcare Providers: IncredibleEmployment.be  This test is not yet approved or cleared by the [place] FDA and has been authorized for detection and/or diagnosis of SARS-CoV-2 by FDA under an Emergency Use Authorization (EUA). This EUA will remain in effect (meaning this test can be used) for the duration of the COVID-19 declaration under Section 564(b)(1) of the Act, 21 U.S.C. section 360bbb-3(b)(1), unless the authorization is terminated or revoked.  Performed at [facility], [place] [address] [place], [place] [address]   Blood culture (routine x 2)     Status: None (Preliminary result)   Collection Time: 01/01/22  6:27 AM   Specimen: BLOOD  Result Value Ref Range Status   Specimen Description   Final    BLOOD BLOOD LEFT HAND Performed at [place] [address], [place], [place] [address]     Special Requests   Final    BOTTLES DRAWN AEROBIC AND ANAEROBIC Blood Culture adequate volume Performed at [facility] [address], [place], [place] [address]    Culture   Final    NO GROWTH 3 DAYS Performed at [facility], [place] [address], [place], [place] [address]    Report Status PENDING  Incomplete  Blood culture (routine x 2)     Status: None (Preliminary result)   Collection Time: 01/01/22  6:49 AM   Specimen: BLOOD LEFT HAND  Result Value Ref Range  Status   Specimen Description   Final    BLOOD LEFT HAND Performed at [place] [address] [place], [place] [address]    Special Requests   Final    BOTTLES DRAWN AEROBIC AND ANAEROBIC Blood Culture results may not be optimal due to an inadequate volume of blood received in culture bottles Performed at [facility] [address] [place], [place] [address]    Culture   Final    NO GROWTH 3 DAYS Performed at [facility], [place] [address] [place], [place] [address]    Report Status PENDING  Incomplete  Body fluid culture w Gram Stain     Status: None (Preliminary result)   Collection Time: 01/01/22  2:20 PM   Specimen: Synovium; Body Fluid  Result Value Ref Range Status   Specimen Description   Final    SYNOVIAL RIGHT KNEE Performed at [place] [address] [place], [place] [address]    Special Requests   Final    NONE Performed at [facility], [place] [address] [place],  [address]    Gram Stain   Final    FEW WBC PRESENT, PREDOMINANTLY MONONUCLEAR NO ORGANISMS SEEN    Culture   Final    NO GROWTH 3 DAYS Performed at [facility] [address] [place],  [address]    Report Status PENDING  Incomplete     Medications:    allopurinol  300 mg Oral Daily   atorvastatin  40 mg Oral q1800   baclofen  5 mg Oral TID   insulin aspart  0-5 Units Subcutaneous QHS   insulin  aspart  0-9 Units Subcutaneous TID WC   insulin aspart  4 Units Subcutaneous TID WC   mouth rinse  15 mL Mouth Rinse BID   pantoprazole  40 mg Oral BID   polyethylene glycol  17 g Oral Daily   Continuous Infusions:  ceFEPime (MAXIPIME) IV Stopped (01/04/22 0155)   vancomycin 200 mL/hr at 01/04/22 0500      LOS: 3 days   Charlynne Cousins  [facility]  01/04/2022, 8:31 AM

## 2022-01-05 DIAGNOSIS — M25462 Effusion, left knee: Secondary | ICD-10-CM | POA: Diagnosis not present

## 2022-01-05 DIAGNOSIS — D469 Myelodysplastic syndrome, unspecified: Secondary | ICD-10-CM | POA: Diagnosis not present

## 2022-01-05 DIAGNOSIS — E440 Moderate protein-calorie malnutrition: Secondary | ICD-10-CM | POA: Diagnosis not present

## 2022-01-05 DIAGNOSIS — M25061 Hemarthrosis, right knee: Secondary | ICD-10-CM | POA: Diagnosis not present

## 2022-01-05 DIAGNOSIS — D708 Other neutropenia: Secondary | ICD-10-CM | POA: Diagnosis not present

## 2022-01-05 DIAGNOSIS — R6510 Systemic inflammatory response syndrome (SIRS) of non-infectious origin without acute organ dysfunction: Secondary | ICD-10-CM | POA: Diagnosis not present

## 2022-01-05 LAB — GLUCOSE, CAPILLARY
Glucose-Capillary: 173 mg/dL — ABNORMAL HIGH (ref 70–99)
Glucose-Capillary: 220 mg/dL — ABNORMAL HIGH (ref 70–99)

## 2022-01-05 LAB — BODY FLUID CULTURE W GRAM STAIN: Culture: NO GROWTH

## 2022-01-05 MED ORDER — HYDROCODONE-ACETAMINOPHEN 5-325 MG PO TABS
1.0000 | ORAL_TABLET | ORAL | 0 refills | Status: DC | PRN
Start: 2022-01-05 — End: 2022-01-08

## 2022-01-05 MED ORDER — COLCHICINE 0.6 MG PO TABS: 0.6000 mg | ORAL_TABLET | Freq: Two times a day (BID) | ORAL | 0 refills | Status: AC

## 2022-01-05 NOTE — Plan of Care (Signed)
°  Problem: Health Behavior/Discharge Planning: Goal: Ability to manage health-related needs will improve Outcome: Progressing   Problem: Clinical Measurements: Goal: Ability to maintain clinical measurements within normal limits will improve Outcome: Progressing   Problem: Activity: Goal: Risk for activity intolerance will decrease Outcome: Progressing   Problem: Nutrition: Goal: Adequate nutrition will be maintained Outcome: Progressing   

## 2022-01-05 NOTE — Progress Notes (Signed)
Reviewed discharge paperwork with patient and both sons. Reviewed new prescriptions & medication regimen. Provided printed prescription. Patient discharged & escorted via wheelchair by tech.

## 2022-01-05 NOTE — Discharge Summary (Signed)
[facility] Discharge Summary  Eugene Cooper LOV:[id number] DOB: October 20, 1941 DOA: 12/31/2021  PCP: Emelia Loron, NP  Admit date: 12/31/2021 Discharge date: 01/05/2022  Admitted From: Home Disposition:  home  Recommendations for Outpatient Follow-up:  Follow up with PCP in 1-2 weeks   Home Health:Yes Equipment/Devices:none  Discharge Condition:Stable CODE STATUS:Full Diet recommendation: Heart Healthy  Brief/Interim Summary: 81 y.o. male past medical history significant for hyperlipidemia, myelodysplastic syndrome, gout pancytopenia comes in with bilateral knee pain went to his PCP and tried to have his joint aspirated but they were unable to get anything came to the ED x-ray of the knee was notable for soft tissue swelling Ortho was consulted started on IV vancomycin recommended IR consultation for guided aspiration of the right knee due to pancytopenia, they recommended treatment with IV antibiotics and prednisone and physical therapy  Discharge Diagnoses:  Principal Problem:   SIRS (systemic inflammatory response syndrome) ([place]) Active Problems:   Uncontrolled type 2 diabetes mellitus with hyperglycemia, without long-term current use of insulin (HCC)   Pancytopenia (HCC)   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[place])  Bilateral knee pain: Orthopedic surgery was consulted and IR who performed joint aspiration ultrasound-guided after 1 unit of platelets started empirically on IV vancomycin and cefepime synovial fluid showed 11 white blood cells no crystals. We are concerned about hemarthrosis ID was consulted recommended to stop antibiotics, he was also started on colchicine aquatics his pain slowly started to improve physical therapy evaluated the patient and recommended home health PT.  MDS/pancytopenia: Dr. Annamaria Boots evaluated the patient recommended no further chemotherapy follow-up with them as an outpatient.  Diabetes mellitus type 2: Blood glucose fairly controlled no changes  made.  Central hypertension: No antihypertensive medications at home.  Hyperlipidemia: Continue statins.  Discharge Instructions  Discharge Instructions     Diet - low sodium heart healthy   Complete by: As directed    Increase activity slowly   Complete by: As directed       Allergies as of 01/05/2022   No Known Allergies      Medication List     TAKE these medications    Accu-Chek Softclix Lancets lancets USE UPTO FOUR TIMES DAILY AS DIRECTED   acetaminophen 325 MG tablet Commonly known as: TYLENOL Take 1-2 tablets (325-650 mg total) by mouth every 4 (four) hours as needed for mild pain.   allopurinol 300 MG tablet Commonly known as: ZYLOPRIM Take 1 tablet (300 mg total) by mouth daily.   atorvastatin 40 MG tablet Commonly known as: LIPITOR Take 1 tablet (40 mg total) by mouth daily at 6 PM.   blood glucose meter kit and supplies Kit Dispense based on patient and insurance preference. Use up to four times daily as directed. (FOR ICD-9 250.00, 250.01).   colchicine 0.6 MG tablet Take 1 tablet (0.6 mg total) by mouth 2 (two) times daily.   diclofenac Sodium 1 % Gel Commonly known as: VOLTAREN Apply 2 g topically 4 (four) times daily. What changed:  when to take this reasons to take this   HYDROcodone-acetaminophen 5-325 MG tablet Commonly known as: Norco Take 1 tablet by mouth every 4 (four) hours as needed for up to 3 days for moderate pain.   magnesium oxide 400 (240 Mg) MG tablet Commonly known as: MAG-OX Take 0.5 tablets (200 mg total) by mouth 2 (two) times daily.   metFORMIN 1000 MG tablet Commonly known as: GLUCOPHAGE Take 1,000 mg by mouth 2 (two) times daily.   ondansetron 8 MG tablet Commonly known as:  Zofran Take 1 tablet (8 mg total) by mouth 2 (two) times daily as needed (Nausea or vomiting).   pantoprazole 40 MG tablet Commonly known as: Protonix Take 1 tablet (40 mg total) by mouth 2 (two) times daily.        No Known  Allergies  Consultations: Oncology Orthopedic surgery   Procedures/Studies: IR US Guide Bx Asp/Drain  Result Date: 01/01/2022 CLINICAL DATA:  Status post attempted and failed right knee joint aspiration at Urgent Care. Right knee pain, suprapatellar bursal effusion seen on previous x-ray. Request for aspiration of the effusion. EXAM: ULTRASOUND GUIDED RIGHT KNEE JOINT ASPIRATION PROCEDURE: After a thorough discussion of risks and benefits of the procedure including the general risks of bleeding, infection, damage to adjacent structures, written consent was obtained. Specific risks of this procedure included false positive and false negative results and introduction of infection into the joint. Time-out form was completed when appropriate. The right knee was examined and scanned by ultrasound. The skin was prepped and draped in the usual sterile fashion with Chloraprep. Local anesthesia and deep anesthesia was provided with 1% lidocaine without epinephrine. Careful attention was paid not to inject bacteriostatic lidocaine into the joint. Under direct ultrasound guidance, a 20 gauge spinal needle was advanced into the suprapatellar space of the right knee joint from a lateral approach. Aspiration was performed which yielded 8 mL of serosanguinous fluid. Fluid was sent for requested lab analysis. The needle was removed and a dressing was placed. IMPRESSION: Technically successful right knee aspiration at the level of a suprapatellar joint effusion yielding 8 mL of serosanguinous fluid. Read by: Durenda Guthrie, PA-C Electronically Signed   By: Aletta Edouard M.D.   On: 01/01/2022 14:56   DG Knee Complete 4 Views Left  Result Date: 12/31/2021 CLINICAL DATA:  Left knee pain. EXAM: LEFT KNEE - COMPLETE 4+ VIEW COMPARISON:  None FINDINGS: There is no acute fracture or dislocation. Mild osteopenia. Moderate arthritic changes with tricompartmental narrowing. No joint effusion. The soft tissues are unremarkable.  IMPRESSION: 1. No acute fracture or dislocation. 2. Moderate arthritic changes. Electronically Signed   By: Anner Crete M.D.   On: 12/31/2021 21:12   DG Knee Complete 4 Views Right  Result Date: 12/31/2021 CLINICAL DATA:  Redness and swelling at both knees. Possible septic arthropathy. EXAM: RIGHT KNEE - COMPLETE 4+ VIEW COMPARISON:  AP Lat right knee 11/01/2021. FINDINGS: There is mild circumferential soft tissue fullness. There is a minimal suprapatellar bursal effusion on lateral view There is mild osteopenia. No fracture is evident. There are mild features of nonerosive tricompartmental degenerative arthrosis consisting of mild femorotibial and patellofemoral joint space loss and small tricompartmental marginal spurs. There is enthesopathic spurring of the anterior superior patella which was also seen previously. There is a fabella posterolaterally. Early degenerative arthrosis of the proximal tibiofibular joint. IMPRESSION: 1. Mild generalized soft tissue swelling. 2. Minimal suprapatellar bursal effusion. 3. 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 4. Only noteworthy interval change since 11/01/2021 is the superficial soft tissue prominence. Electronically Signed   By: Telford Nab M.D.   On: 12/31/2021 21:16   ECHOCARDIOGRAM COMPLETE  Result Date: 12/06/2021    ECHOCARDIOGRAM REPORT   Patient Name:   Eugene Cooper Date of Exam: 12/06/2021 Medical Rec #:  [id number]          Height:       62.0 in Accession #:    [id number]         Weight:  133.6 lb Date of Birth:  December 18, 1940          BSA:          1.610 m Patient Age:    82 years           BP:           131/81 mmHg Patient Gender: M                  HR:           98 bpm. Exam Location:  Inpatient Procedure: 2D Echo, Cardiac Doppler and Color Doppler Indications:    R55 Syncope  History:        Patient has prior history of Echocardiogram examinations, most                 recent 10/29/2021. Stroke,  Arrythmias:LBBB; Risk                 Factors:Hypertension and Diabetes.  Sonographer:    Glo Herring Referring Phys: [id number] [place]  1. Compared with the echo 88/8280, systolic function is worse. Left ventricular ejection fraction, by estimation, is 40 to 45%. The left ventricle has mildly decreased function. The left ventricle demonstrates regional wall motion abnormalities (see scoring diagram/findings for description). There is mild concentric left ventricular hypertrophy. Left ventricular diastolic parameters are consistent with Grade I diastolic dysfunction (impaired relaxation).  2. Right ventricular systolic function is normal. The right ventricular size is normal. There is moderately elevated pulmonary artery systolic pressure.  3. The mitral valve is normal in structure. Trivial mitral valve regurgitation. No evidence of mitral stenosis.  4. The aortic valve is tricuspid. There is mild calcification of the aortic valve. There is mild thickening of the aortic valve. Aortic valve regurgitation is mild. Aortic valve sclerosis is present, with no evidence of aortic valve stenosis. Aortic valve area, by VTI measures 1.76 cm. Aortic valve mean gradient measures 6.0 mmHg. Aortic valve Vmax measures 1.67 m/s.  5. Aortic dilatation noted. There is mild dilatation of the aortic root, measuring 38 mm. There is mild dilatation of the ascending aorta, measuring 37 mm.  6. The inferior vena cava is normal in size with greater than 50% respiratory variability, suggesting right atrial pressure of 3 mmHg. FINDINGS  Left Ventricle: Compared with the echo 02/4916, systolic function is worse. Left ventricular ejection fraction, by estimation, is 40 to 45%. The left ventricle has mildly decreased function. The left ventricle demonstrates regional wall motion abnormalities. The left ventricular internal cavity size was normal in size. There is mild concentric left ventricular hypertrophy. Left  ventricular diastolic parameters are consistent with Grade I diastolic dysfunction (impaired relaxation).  LV Wall Scoring: The mid and distal lateral wall, entire septum, and basal inferior segment are hypokinetic. The entire anterior wall, antero-lateral wall, mid and distal inferior wall, basal inferolateral segment, and apex are normal. Right Ventricle: The right ventricular size is normal. No increase in right ventricular wall thickness. Right ventricular systolic function is normal. There is moderately elevated pulmonary artery systolic pressure. The tricuspid regurgitant velocity is 2.86 m/s, and with an assumed right atrial pressure of 15 mmHg, the estimated right ventricular systolic pressure is 91.5 mmHg. Left Atrium: Left atrial size was normal in size. Right Atrium: Right atrial size was normal in size. Pericardium: There is no evidence of pericardial effusion. Mitral Valve: The mitral valve is normal in structure. Trivial mitral valve regurgitation. No evidence of mitral valve stenosis. Tricuspid Valve:  The tricuspid valve is normal in structure. Tricuspid valve regurgitation is trivial. No evidence of tricuspid stenosis. Aortic Valve: The aortic valve is tricuspid. There is mild calcification of the aortic valve. There is mild thickening of the aortic valve. Aortic valve regurgitation is mild. Aortic valve sclerosis is present, with no evidence of aortic valve stenosis. Aortic valve mean gradient measures 6.0 mmHg. Aortic valve peak gradient measures 11.2 mmHg. Aortic valve area, by VTI measures 1.76 cm. Pulmonic Valve: The pulmonic valve was normal in structure. Pulmonic valve regurgitation is not visualized. No evidence of pulmonic stenosis. Aorta: Aortic dilatation noted. There is mild dilatation of the aortic root, measuring 38 mm. There is mild dilatation of the ascending aorta, measuring 37 mm. Venous: The inferior vena cava is normal in size with greater than 50% respiratory variability,  suggesting right atrial pressure of 3 mmHg. IAS/Shunts: No atrial level shunt detected by color flow Doppler.  LEFT VENTRICLE PLAX 2D LVIDd:         5.20 cm LVIDs:         3.70 cm LV PW:         1.10 cm LV IVS:        1.10 cm LVOT diam:     2.00 cm LV SV:         55 LV SV Index:   34 LVOT Area:     3.14 cm  LV Volumes (MOD) LV vol d, MOD A2C: 74.8 ml LV vol d, MOD A4C: 103.0 ml LV vol s, MOD A2C: 45.7 ml LV vol s, MOD A4C: 67.0 ml LV SV MOD A2C:     29.1 ml LV SV MOD A4C:     103.0 ml LV SV MOD BP:      31.7 ml RIGHT VENTRICLE            IVC RV Basal diam:  3.30 cm    IVC diam: 2.30 cm RV Mid diam:    1.70 cm RV S prime:     7.72 cm/s LEFT ATRIUM             Index        RIGHT ATRIUM           Index LA diam:        3.50 cm 2.17 cm/m   RA Area:     13.50 cm LA Vol (A2C):   39.0 ml 24.22 ml/m  RA Volume:   27.10 ml  16.83 ml/m LA Vol (A4C):   36.8 ml 22.85 ml/m LA Biplane Vol: 39.3 ml 24.40 ml/m  AORTIC VALVE                     PULMONIC VALVE AV Area (Vmax):    1.98 cm      PV Vmax:       0.84 m/s AV Area (Vmean):   1.93 cm      PV Peak grad:  2.9 mmHg AV Area (VTI):     1.76 cm AV Vmax:           167.00 cm/s AV Vmean:          119.000 cm/s AV VTI:            0.315 m AV Peak Grad:      11.2 mmHg AV Mean Grad:      6.0 mmHg LVOT Vmax:         105.00 cm/s LVOT Vmean:        73.100  cm/s LVOT VTI:          0.176 m LVOT/AV VTI ratio: 0.56  AORTA Ao Root diam: 3.80 cm Ao Asc diam:  3.70 cm TRICUSPID VALVE TR Peak grad:   32.7 mmHg TR Vmax:        286.00 cm/s  SHUNTS Systemic VTI:  0.18 m Systemic Diam: 2.00 cm Skeet Latch MD Electronically signed by Skeet Latch MD Signature Date/Time: 12/06/2021/6:50:28 PM    Final     Subjective: No complaints he relates his knee pain is better  Discharge Exam: Vitals:   01/04/22 1355 01/05/22 0559  BP: 100/61 104/72  Pulse: 99 91  Resp: 18 16  Temp: 99.7 F (37.6 C) 98.5 F (36.9 C)  SpO2: 98% 100%   Vitals:   01/03/22 2101 01/04/22 0347 01/04/22 1355  01/05/22 0559  BP: 110/65 117/64 100/61 104/72  Pulse: 96 (!) 102 99 91  Resp: 19 18 18 16   Temp: 98.6 F (37 C) 97.6 F (36.4 C) 99.7 F (37.6 C) 98.5 F (36.9 C)  TempSrc: Oral Oral Oral Oral  SpO2: 100% 100% 98% 100%  Weight:      Height:        General: Pt is alert, awake, not in acute distress Cardiovascular: RRR, S1/S2 +, no rubs, no gallops Respiratory: CTA bilaterally, no wheezing, no rhonchi Abdominal: Soft, NT, ND, bowel sounds + Extremities: no edema, no cyanosis    The results of significant diagnostics from this hospitalization (including imaging, microbiology, ancillary and laboratory) are listed below for reference.     Microbiology: Recent Results (from the past 240 hour(s))  Culture, Urine     Status: Abnormal   Collection Time: 12/27/21  4:08 PM   Specimen: Urine, Clean Catch  Result Value Ref Range Status   Specimen Description   Final    URINE, CLEAN CATCH Performed at [facility], [address] [place], [place] [address]    Special Requests   Final    NONE Performed at [facility], [place] [address] [place], [place] [address]    Culture (A)  Final    <10,000 COLONIES/mL INSIGNIFICANT GROWTH Performed at [facility] [address] [place], [place] [address]    Report Status 12/28/2021 FINAL  Final  Resp Panel by RT-PCR (Flu A&B, Covid) Nasopharyngeal Swab     Status: None   Collection Time: 01/01/22  5:16 AM   Specimen: Nasopharyngeal Swab; Nasopharyngeal(NP) swabs in vial transport medium  Result Value Ref Range Status   SARS Coronavirus 2 by RT PCR NEGATIVE NEGATIVE Final    Comment: (NOTE) SARS-CoV-2 target nucleic acids are NOT DETECTED.  The SARS-CoV-2 RNA is generally detectable in upper respiratory specimens during the acute phase of infection. The lowest concentration of SARS-CoV-2 viral copies this assay can detect is 138 copies/mL. A negative result does not preclude  SARS-Cov-2 infection and should not be used as the sole basis for treatment or other patient management decisions. A negative result may occur with  improper specimen collection/handling, submission of specimen other than nasopharyngeal swab, presence of viral mutation(s) within the areas targeted by this assay, and inadequate number of viral copies(<138 copies/mL). A negative result must be combined with clinical observations, patient history, and epidemiological information. The expected result is Negative.  Fact Sheet for Patients:  EntrepreneurPulse.com.au  Fact Sheet for Healthcare Providers:  IncredibleEmployment.be  This test is no t yet approved or cleared by the [place] and  has been authorized for detection and/or diagnosis of SARS-CoV-2 by FDA under an Emergency Use Authorization (EUA). This EUA will remain  in effect (meaning this test can be used) for the duration of the COVID-19 declaration under Section 564(b)(1) of the Act, 21 U.S.C.section 360bbb-3(b)(1), unless the authorization is terminated  or revoked sooner.       Influenza A by PCR NEGATIVE NEGATIVE Final   Influenza B by PCR NEGATIVE NEGATIVE Final    Comment: (NOTE) The Xpert Xpress SARS-CoV-2/FLU/RSV plus assay is intended as an aid in the diagnosis of influenza from Nasopharyngeal swab specimens and should not be used as a sole basis for treatment. Nasal washings and aspirates are unacceptable for Xpert Xpress SARS-CoV-2/FLU/RSV testing.  Fact Sheet for Patients: EntrepreneurPulse.com.au  Fact Sheet for Healthcare Providers: IncredibleEmployment.be  This test is not yet approved or cleared by the [place] FDA and has been authorized for detection and/or diagnosis of SARS-CoV-2 by FDA under an Emergency Use Authorization (EUA). This EUA will remain in effect (meaning this test can be used) for the duration of  the COVID-19 declaration under Section 564(b)(1) of the Act, 21 U.S.C. section 360bbb-3(b)(1), unless the authorization is terminated or revoked.  Performed at [facility], [place] [address] [place], [place] [address]   Blood culture (routine x 2)     Status: None (Preliminary result)   Collection Time: 01/01/22  6:27 AM   Specimen: BLOOD  Result Value Ref Range Status   Specimen Description   Final    BLOOD BLOOD LEFT HAND Performed at [place] [address], [place], [place] [address]    Special Requests   Final    BOTTLES DRAWN AEROBIC AND ANAEROBIC Blood Culture adequate volume Performed at [place] [address] [place], [place] [address]    Culture   Final    NO GROWTH 3 DAYS Performed at [facility], [place] [address] [place], [place] [address]    Report Status PENDING  Incomplete  Blood culture (routine x 2)     Status: None (Preliminary result)   Collection Time: 01/01/22  6:49 AM   Specimen: BLOOD LEFT HAND  Result Value Ref Range Status   Specimen Description   Final    BLOOD LEFT HAND Performed at [place] [address], [place], [place] [address]    Special Requests   Final    BOTTLES DRAWN AEROBIC AND ANAEROBIC Blood Culture results may not be optimal due to an inadequate volume of blood received in culture bottles Performed at [place] [address], [place], [place] [address]    Culture   Final    NO GROWTH 3 DAYS Performed at [facility], [place] [address], [place], [place] [address]    Report Status PENDING  Incomplete  Body fluid culture w Gram Stain     Status: None (Preliminary result)   Collection Time: 01/01/22  2:20 PM   Specimen: Synovium; Body Fluid  Result Value Ref Range Status   Specimen Description   Final    SYNOVIAL RIGHT KNEE Performed at [place] [address] [place] [address], [place], [place]  [address]    Special Requests   Final    NONE Performed at [facility], [place] [address], [place], [place] [address]    Gram Stain   Final    FEW WBC PRESENT, PREDOMINANTLY MONONUCLEAR NO ORGANISMS SEEN    Culture  Final    NO GROWTH 3 DAYS Performed at [facility], [place] [address], [place], [place] [address]    Report Status PENDING  Incomplete     Labs: BNP (last 3 results) Recent Labs    11/01/21 0213  BNP 185.6*   Basic Metabolic Panel: Recent Labs  Lab 12/31/21 0825 01/01/22 1059 01/02/22 0514 01/04/22 0522  NA 132* 133* 132* 133*  K 3.9 3.7 3.9 4.0  CL 98 98 97* 98  CO2 23 26 26 28   GLUCOSE 288* 231* 243* 164*  BUN 15 21 20 16   CREATININE 0.79 0.66 0.60* 0.61  CALCIUM 9.0 8.3* 8.3* 8.2*   Liver Function Tests: Recent Labs  Lab 12/31/21 0825 01/01/22 1059 01/02/22 0514  AST 9* 16 27  ALT 10 16 25   ALKPHOS 93 92 106  BILITOT 1.0 0.9 1.1  PROT 6.0* 5.8* 5.7*  ALBUMIN 3.5 3.0* 2.9*   No results for input(s): LIPASE, AMYLASE in the last 168 hours. No results for input(s): AMMONIA in the last 168 hours. CBC: Recent Labs  Lab 12/31/21 0822 01/01/22 0516 01/02/22 0514 01/02/22 1028 01/03/22 0909 01/04/22 0902  WBC 1.5* 1.8* 2.0* 1.9* 2.2* 1.9*  NEUTROABS 0.7* 0.6*  --  0.6*  --  0.7*  HGB 7.5* 9.4* 8.5* 8.3* 8.8* 8.1*  HCT 21.3* 26.8* 24.8* 24.3* 25.6* 23.7*  MCV 84.9 86.5 87.9 87.7 88.0 88.4  PLT 8* 13* 28* 24* 19* 49*   Cardiac Enzymes: No results for input(s): CKTOTAL, CKMB, CKMBINDEX, TROPONINI in the last 168 hours. BNP: Invalid input(s): POCBNP CBG: Recent Labs  Lab 01/04/22 0803 01/04/22 1205 01/04/22 1613 01/04/22 2137 01/05/22 0754  GLUCAP 168* 290* 207* 151* 173*   D-Dimer No results for input(s): DDIMER in the last 72 hours. Hgb A1c No results for input(s): HGBA1C in the last 72 hours. Lipid Profile No results for input(s): CHOL, HDL, LDLCALC, TRIG, CHOLHDL, LDLDIRECT in the last 72 hours. Thyroid  function studies No results for input(s): TSH, T4TOTAL, T3FREE, THYROIDAB in the last 72 hours.  Invalid input(s): FREET3 Anemia work up No results for input(s): VITAMINB12, FOLATE, FERRITIN, TIBC, IRON, RETICCTPCT in the last 72 hours. Urinalysis    Component Value Date/Time   COLORURINE YELLOW 12/27/2021 1608   APPEARANCEUR CLEAR 12/27/2021 1608   LABSPEC 1.023 12/27/2021 1608   PHURINE 5.0 12/27/2021 1608   GLUCOSEU NEGATIVE 12/27/2021 1608   HGBUR NEGATIVE 12/27/2021 1608   BILIRUBINUR NEGATIVE 12/27/2021 1608   KETONESUR NEGATIVE 12/27/2021 1608   PROTEINUR NEGATIVE 12/27/2021 1608   NITRITE NEGATIVE 12/27/2021 1608   LEUKOCYTESUR NEGATIVE 12/27/2021 1608   Sepsis Labs Invalid input(s): PROCALCITONIN,  WBC,  LACTICIDVEN Microbiology Recent Results (from the past 240 hour(s))  Culture, Urine     Status: Abnormal   Collection Time: 12/27/21  4:08 PM   Specimen: Urine, Clean Catch  Result Value Ref Range Status   Specimen Description   Final    URINE, CLEAN CATCH Performed at [facility], [place] [address], [place], [place] [address]    Special Requests   Final    NONE Performed at [facility], [place] [address], [place], [place] [address]    Culture (A)  Final    <10,000 COLONIES/mL INSIGNIFICANT GROWTH Performed at [place] [address] [place],  [address]    Report Status 12/28/2021 FINAL  Final  Resp Panel by RT-PCR (Flu A&B, Covid) Nasopharyngeal Swab     Status: None  Collection Time: 01/01/22  5:16 AM   Specimen: Nasopharyngeal Swab; Nasopharyngeal(NP) swabs in vial transport medium  Result Value Ref Range Status   SARS Coronavirus 2 by RT PCR NEGATIVE NEGATIVE Final    Comment: (NOTE) SARS-CoV-2 target nucleic acids are NOT DETECTED.  The SARS-CoV-2 RNA is generally detectable in upper respiratory specimens during the acute phase of infection. The lowest concentration of SARS-CoV-2 viral  copies this assay can detect is 138 copies/mL. A negative result does not preclude SARS-Cov-2 infection and should not be used as the sole basis for treatment or other patient management decisions. A negative result may occur with  improper specimen collection/handling, submission of specimen other than nasopharyngeal swab, presence of viral mutation(s) within the areas targeted by this assay, and inadequate number of viral copies(<138 copies/mL). A negative result must be combined with clinical observations, patient history, and epidemiological information. The expected result is Negative.  Fact Sheet for Patients:  EntrepreneurPulse.com.au  Fact Sheet for Healthcare Providers:  IncredibleEmployment.be  This test is no t yet approved or cleared by the [place] FDA and  has been authorized for detection and/or diagnosis of SARS-CoV-2 by FDA under an Emergency Use Authorization (EUA). This EUA will remain  in effect (meaning this test can be used) for the duration of the COVID-19 declaration under Section 564(b)(1) of the Act, 21 U.S.C.section 360bbb-3(b)(1), unless the authorization is terminated  or revoked sooner.       Influenza A by PCR NEGATIVE NEGATIVE Final   Influenza B by PCR NEGATIVE NEGATIVE Final    Comment: (NOTE) The Xpert Xpress SARS-CoV-2/FLU/RSV plus assay is intended as an aid in the diagnosis of influenza from Nasopharyngeal swab specimens and should not be used as a sole basis for treatment. Nasal washings and aspirates are unacceptable for Xpert Xpress SARS-CoV-2/FLU/RSV testing.  Fact Sheet for Patients: EntrepreneurPulse.com.au  Fact Sheet for Healthcare Providers: IncredibleEmployment.be  This test is not yet approved or cleared by the [place] FDA and has been authorized for detection and/or diagnosis of SARS-CoV-2 by FDA under an Emergency Use Authorization (EUA). This  EUA will remain in effect (meaning this test can be used) for the duration of the COVID-19 declaration under Section 564(b)(1) of the Act, 21 U.S.C. section 360bbb-3(b)(1), unless the authorization is terminated or revoked.  Performed at [facility], [place] [address] [place], [place] [address]   Blood culture (routine x 2)     Status: None (Preliminary result)   Collection Time: 01/01/22  6:27 AM   Specimen: BLOOD  Result Value Ref Range Status   Specimen Description   Final    BLOOD BLOOD LEFT HAND Performed at [place] [address], [place], [place] [address]    Special Requests   Final    BOTTLES DRAWN AEROBIC AND ANAEROBIC Blood Culture adequate volume Performed at [place] [address] [place], [place] [address]    Culture   Final    NO GROWTH 3 DAYS Performed at [facility], [place] [address], [place], [place] [address]    Report Status PENDING  Incomplete  Blood culture (routine x 2)     Status: None (Preliminary result)   Collection Time: 01/01/22  6:49 AM   Specimen: BLOOD LEFT HAND  Result Value Ref Range Status   Specimen Description   Final    BLOOD LEFT HAND Performed at [place] [address] [place], [place] [address]    Special  Requests   Final    BOTTLES DRAWN AEROBIC AND ANAEROBIC Blood Culture results may not be optimal due to an inadequate volume of blood received in culture bottles Performed at [place] [address] [place], [place] [address]    Culture   Final    NO GROWTH 3 DAYS Performed at [facility], [place] [address] [place], [place] [address]    Report Status PENDING  Incomplete  Body fluid culture w Gram Stain     Status: None (Preliminary result)   Collection Time: 01/01/22  2:20 PM   Specimen: Synovium; Body Fluid  Result Value Ref Range Status   Specimen Description   Final    SYNOVIAL RIGHT  KNEE Performed at [place] [address], [place], [place] [address]    Special Requests   Final    NONE Performed at [facility], [place] [address] [place], [place] [address]    Gram Stain   Final    FEW WBC PRESENT, PREDOMINANTLY MONONUCLEAR NO ORGANISMS SEEN    Culture   Final    NO GROWTH 3 DAYS Performed at [facility] [address], [place], [place] [address]    Report Status PENDING  Incomplete     Time coordinating discharge: Over 30 minutes  SIGNED:   Charlynne Cousins, MD  [facility] 01/05/2022, 8:23 AM Pager   If 7PM-7AM, please contact night-coverage [email or web address] Password [email or web address]

## 2022-01-06 LAB — CULTURE, BLOOD (ROUTINE X 2)
Culture: NO GROWTH
Culture: NO GROWTH
Special Requests: ADEQUATE

## 2022-01-06 NOTE — Progress Notes (Signed)
[place]   Telephone:([phone]) [phone] Fax:(336) [phone]   Clinic Follow up Note   Patient Care Team: Emelia Loron, NP as PCP - General (Nurse Practitioner) Jettie Booze, MD as PCP - Cardiology (Cardiology) Truitt Merle, MD as Consulting Physician (Hematology)  Date of Service:  01/07/2022  CHIEF COMPLAINT: f/u of pancytopenia/MDS  CURRENT THERAPY:  Vidaza, given days 1-5 every 28 days, started 12/04/21 Blood: 1 unit of PRBC if hemoglobin 7.0-8.0, 2 units if hemoglobin less than 7.0 Platelet transfusion if platelet less than 15 K  ASSESSMENT & PLAN:  Eugene Cooper is a 81 y.o. male with   1. 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 He was found to have pancytopenia with severe anemia Hgb 6.4 on admission. Further work up ruled out nutritional anemia (K80, folic acid, iron) and hemolysis -EGD showed erosive gastritis, currently asymptomatic on PPI -imaging was negative for lymphadenopathy or solid organ malignancy, it did show findings c/w pneumonia, he was treated with broad spectrum antibiotics, he is followed by ID but work up was essentially negative -He underwent bone marrow biopsy 11/08/21, which shows hypercellular marrow with increased blasts up to 10-15%, consistent with myelodysplastic syndrome (MDS) -His cytogenetics showed complex genetic changes (>3) including 5q-. NGS showed CALR and TP53 mutations, not targetable mutations  -based on his IPSS-R, he has very high risk to develop AML, median survival is 0.8 year in this group. He is not a candidate for intensive chemotherapy or bone marrow transplant if he develops AML -He started Vidaza injections on 12/04/21. He tolerated well but developed worsening pancytopenia  -He was recently hospitalized for bilateral knee pain, work-up was negative for infection or gout, his main concern is still his knee pain.  Is not able to  walk. -I had a long conversation with patient, his daughter and his son about overall poor prognosis and my recommendation of hospice, versus 1 more cycle chemo and repeating BM biopsy after that to evaluate his response to chemo. Pt and his children would like to proceed with chemo today  -If he has progressive disease, will stop chemo and offer hospice    2. Pancytopenia -he was admitted on 12/05/21 with symptomatic anemia, syncopal episode. He received 2 units pRBCs during admission. -hgb 8.0 today (01/07/22). Will give one unit blood today    3. Goal of care discussion  -We discussed the overall poor prognosis, especially if he does not have good response to treatment. -The patient understands the goal of care is palliative.   4. Deconditioning, weight loss  -He lost approx 40 lbs in 6 months (6-11/2021) -He is not eating much, per family. He requires assistance with ADLs and walker at home. Reviewed fall precautions.  -endorses taking 2 Ensure a day. -referred to dietician    5. Gout and knee pain  -refill of colchicine 0.6 mg given 12/11/21.  -presented to ED on 12/31/21 with worsening b/l knee pain, cultures from blood, urine, and synovial from right knee were all negative. -f/u with her PCP    6. CVA, HL, HTN, DM, LBBB -BG 150 - 210 at home -continue med regimen and f/up per PCP, cards, and care team     PLAN:  -we discussed [place] today, he wants to proceed with chemo, and repeat BM biopsy after this cycle  -1u blood today  -f/u in 2 weeks  -urgent referral to palliative care for pain control and  discuss [place]   Addendum -His peripheral blood smear showed 16% blasts, 1 negative pathology review, and I also ordered a flow cytometry -If he is in acute leukemic phase, will stop chemo now, I will f/u in next 1-2 days    No problem-specific Assessment & Plan notes found for this encounter.   SUMMARY OF ONCOLOGIC HISTORY: Oncology History  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[place])   11/09/2021 Initial Diagnosis   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[place])   12/04/2021 -  Chemotherapy   Patient is on Treatment Plan : MYELODYSPLASIA  Azacitidine SQ D1-5 q28d        INTERVAL HISTORY:  Eugene Cooper is here for a follow up of pancytopenia/MDS. He was last seen by me on 01/04/22 while he was in the hospital. He presents to the clinic accompanied by his daughter and son.  He was admitted for join pain last week and was discharged over the weekend.  He still has severe bilateral knee pain, not able to walk.  His appetite is decent, eats well.  He had a 1 episode of fever at home 2 days ago, no chills with no symptoms.  Overall he is very frail and weak, he sleeps most of the time during the day.      All other systems were reviewed with the patient and are negative.  MEDICAL HISTORY:  Past Medical History:  Diagnosis Date   CKD (chronic kidney disease), stage III ([place])    Diabetes ([place])    Hyperlipemia    Hypertension    LBBB (left bundle branch block)    Stroke [facility])     SURGICAL HISTORY: Past Surgical History:  Procedure Laterality Date   BIOPSY  10/29/2021   Procedure: BIOPSY;  Surgeon: Thornton Park, MD;  Location: [facility];  Service: Gastroenterology;;   ESOPHAGOGASTRODUODENOSCOPY (EGD) WITH PROPOFOL N/A 10/29/2021   Procedure: ESOPHAGOGASTRODUODENOSCOPY (EGD) WITH PROPOFOL;  Surgeon: Thornton Park, MD;  Location: [facility];  Service: Gastroenterology;  Laterality: N/A;   IR US GUIDE BX ASP/DRAIN  01/01/2022    I have reviewed the social history and family history with the patient and they are unchanged from previous note.  ALLERGIES:  has No Known Allergies.  MEDICATIONS:  Current Outpatient Medications  Medication Sig Dispense Refill   Accu-Chek Softclix Lancets lancets USE UPTO FOUR TIMES DAILY AS DIRECTED     acetaminophen (TYLENOL) 325 MG tablet Take 1-2 tablets (325-650 mg total) by mouth every 4 (four) hours as needed for mild pain.      allopurinol (ZYLOPRIM) 300 MG tablet Take 1 tablet (300 mg total) by mouth daily. 30 tablet 5   atorvastatin (LIPITOR) 40 MG tablet Take 1 tablet (40 mg total) by mouth daily at 6 PM. 30 tablet 0   blood glucose meter kit and supplies KIT Dispense based on patient and insurance preference. Use up to four times daily as directed. (FOR ICD-9 250.00, 250.01). 1 each 0   colchicine 0.6 MG tablet Take 1 tablet (0.6 mg total) by mouth 2 (two) times daily. 30 tablet 0   diclofenac Sodium (VOLTAREN) 1 % GEL Apply 2 g topically 4 (four) times daily. (Patient taking differently: Apply 2 g topically 4 (four) times daily as needed (knee/ankle pain).) 150 g 1   HYDROcodone-acetaminophen (NORCO) 5-325 MG tablet Take 1 tablet by mouth every 4 (four) hours as needed for up to 3 days for moderate pain. 6 tablet 0   magnesium oxide (MAG-OX) 400 (240 Mg) MG tablet Take 0.5 tablets (200 mg total)  by mouth 2 (two) times daily. 10 tablet 0   metFORMIN (GLUCOPHAGE) 1000 MG tablet Take 1,000 mg by mouth 2 (two) times daily.     ondansetron (ZOFRAN) 8 MG tablet Take 1 tablet (8 mg total) by mouth 2 (two) times daily as needed (Nausea or vomiting). (Patient not taking: Reported on 01/01/2022) 30 tablet 1   pantoprazole (PROTONIX) 40 MG tablet Take 1 tablet (40 mg total) by mouth 2 (two) times daily. 30 tablet 1   No current facility-administered medications for this visit.    PHYSICAL EXAMINATION: ECOG PERFORMANCE STATUS: 3 - Symptomatic, >50% confined to bed  Vitals:   01/07/22 0858  BP: 124/83  Pulse: 99  Resp: 18  Temp: 98.5 F (36.9 C)  SpO2: 97%   Wt Readings from Last 3 Encounters:  01/01/22 122 lb 2.2 oz (55.4 kg)  12/18/21 123 lb 6.4 oz (56 kg)  12/06/21 133 lb 9.6 oz (60.6 kg)     GENERAL:alert, no distress and comfortable SKIN: skin color, texture, turgor are normal, no rashes or significant lesions EYES: normal, Conjunctiva are pink and non-injected, sclera clear NECK: supple, thyroid normal  size, non-tender, without nodularity LYMPH:  no palpable lymphadenopathy in the cervical, axillary  LUNGS: clear to auscultation and percussion with normal breathing effort HEART: regular rate & rhythm and no murmurs and no lower extremity edema ABDOMEN:abdomen soft, non-tender and normal bowel sounds Musculoskeletal:no cyanosis of digits and no clubbing, (+) bilateral knee edema and tenderness  NEURO: alert & oriented x 3 with fluent speech, no focal motor/sensory deficits  LABORATORY DATA:  I have reviewed the data as listed CBC Latest Ref Rng & Units 01/07/2022 01/04/2022 01/03/2022  WBC 4.0 - 10.5 K/uL 2.8(L) 1.9(L) 2.2(L)  Hemoglobin 13.0 - 17.0 g/dL 8.0(L) 8.1(L) 8.8(L)  Hematocrit 39.0 - 52.0 % 22.7(L) 23.7(L) 25.6(L)  Platelets 150 - 400 K/uL 25(L) 49(L) 19(LL)     CMP Latest Ref Rng & Units 01/04/2022 01/02/2022 01/01/2022  Glucose 70 - 99 mg/dL 164(H) 243(H) 231(H)  BUN 8 - 23 mg/dL 16 20 21   Creatinine 0.61 - 1.24 mg/dL 0.61 0.60(L) 0.66  Sodium 135 - 145 mmol/L 133(L) 132(L) 133(L)  Potassium 3.5 - 5.1 mmol/L 4.0 3.9 3.7  Chloride 98 - 111 mmol/L 98 97(L) 98  CO2 22 - 32 mmol/L 28 26 26   Calcium 8.9 - 10.3 mg/dL 8.2(L) 8.3(L) 8.3(L)  Total Protein 6.5 - 8.1 g/dL - 5.7(L) 5.8(L)  Total Bilirubin 0.3 - 1.2 mg/dL - 1.1 0.9  Alkaline Phos 38 - 126 U/L - 106 92  AST 15 - 41 U/L - 27 16  ALT 0 - 44 U/L - 25 16      RADIOGRAPHIC STUDIES: I have personally reviewed the radiological images as listed and agreed with the findings in the report. No results found.    No orders of the defined types were placed in this encounter.  All questions were answered. The patient knows to call the clinic with any problems, questions or concerns. No barriers to learning was detected. The total time spent in the appointment was 40 minutes.     Truitt Merle, MD 01/07/2022   I, Wilburn Mylar, am acting as scribe for Truitt Merle, MD.   I have reviewed the above documentation for accuracy and  completeness, and I agree with the above.

## 2022-01-07 ENCOUNTER — Other Ambulatory Visit: Payer: Self-pay

## 2022-01-07 ENCOUNTER — Ambulatory Visit: Payer: Medicare Other

## 2022-01-07 ENCOUNTER — Ambulatory Visit: Payer: Medicare Other | Admitting: Hematology

## 2022-01-07 ENCOUNTER — Other Ambulatory Visit: Payer: Medicare Other

## 2022-01-07 ENCOUNTER — Inpatient Hospital Stay: Payer: Medicare Other

## 2022-01-07 ENCOUNTER — Ambulatory Visit: Payer: Medicare Other | Admitting: Hematology and Oncology

## 2022-01-07 ENCOUNTER — Encounter: Payer: Self-pay | Admitting: Hematology

## 2022-01-07 ENCOUNTER — Inpatient Hospital Stay (HOSPITAL_BASED_OUTPATIENT_CLINIC_OR_DEPARTMENT_OTHER): Payer: Medicare Other | Admitting: Hematology

## 2022-01-07 VITALS — BP 124/83 | HR 99 | Temp 98.5°F | Resp 18 | Ht 62.0 in

## 2022-01-07 VITALS — BP 147/78 | HR 93 | Temp 98.9°F | Resp 20

## 2022-01-07 DIAGNOSIS — E1122 Type 2 diabetes mellitus with diabetic chronic kidney disease: Secondary | ICD-10-CM | POA: Diagnosis not present

## 2022-01-07 DIAGNOSIS — R634 Abnormal weight loss: Secondary | ICD-10-CM | POA: Diagnosis not present

## 2022-01-07 DIAGNOSIS — D469 Myelodysplastic syndrome, unspecified: Secondary | ICD-10-CM | POA: Diagnosis not present

## 2022-01-07 DIAGNOSIS — I129 Hypertensive chronic kidney disease with stage 1 through stage 4 chronic kidney disease, or unspecified chronic kidney disease: Secondary | ICD-10-CM | POA: Diagnosis not present

## 2022-01-07 DIAGNOSIS — N183 Chronic kidney disease, stage 3 unspecified: Secondary | ICD-10-CM | POA: Diagnosis not present

## 2022-01-07 DIAGNOSIS — R509 Fever, unspecified: Secondary | ICD-10-CM | POA: Diagnosis not present

## 2022-01-07 DIAGNOSIS — I447 Left bundle-branch block, unspecified: Secondary | ICD-10-CM | POA: Diagnosis not present

## 2022-01-07 DIAGNOSIS — Z8673 Personal history of transient ischemic attack (TIA), and cerebral infarction without residual deficits: Secondary | ICD-10-CM | POA: Diagnosis not present

## 2022-01-07 DIAGNOSIS — D61818 Other pancytopenia: Secondary | ICD-10-CM | POA: Diagnosis not present

## 2022-01-07 DIAGNOSIS — E785 Hyperlipidemia, unspecified: Secondary | ICD-10-CM | POA: Diagnosis not present

## 2022-01-07 DIAGNOSIS — M109 Gout, unspecified: Secondary | ICD-10-CM | POA: Diagnosis not present

## 2022-01-07 DIAGNOSIS — D4622 Refractory anemia with excess of blasts 2: Secondary | ICD-10-CM | POA: Diagnosis present

## 2022-01-07 DIAGNOSIS — Z5111 Encounter for antineoplastic chemotherapy: Secondary | ICD-10-CM | POA: Diagnosis present

## 2022-01-07 LAB — CBC WITH DIFFERENTIAL/PLATELET
Abs Immature Granulocytes: 0 10*3/uL (ref 0.00–0.07)
Basophils Absolute: 0 10*3/uL (ref 0.0–0.1)
Basophils Relative: 0 %
Blasts: 4 %
Eosinophils Absolute: 0 10*3/uL (ref 0.0–0.5)
Eosinophils Relative: 0 %
HCT: 26.8 % — ABNORMAL LOW (ref 39.0–52.0)
Hemoglobin: 9.4 g/dL — ABNORMAL LOW (ref 13.0–17.0)
Lymphocytes Relative: 58 %
Lymphs Abs: 1 10*3/uL (ref 0.7–4.0)
MCH: 30.3 pg (ref 26.0–34.0)
MCHC: 35.1 g/dL (ref 30.0–36.0)
MCV: 86.5 fL (ref 80.0–100.0)
Monocytes Absolute: 0 10*3/uL — ABNORMAL LOW (ref 0.1–1.0)
Monocytes Relative: 2 %
Myelocytes: 2 %
Neutro Abs: 0.6 10*3/uL — ABNORMAL LOW (ref 1.7–7.7)
Neutrophils Relative %: 34 %
Platelets: 13 10*3/uL — CL (ref 150–400)
RBC: 3.1 MIL/uL — ABNORMAL LOW (ref 4.22–5.81)
RDW: 14.6 % (ref 11.5–15.5)
WBC: 1.8 10*3/uL — ABNORMAL LOW (ref 4.0–10.5)
nRBC: 0 % (ref 0.0–0.2)

## 2022-01-07 LAB — CBC WITH DIFFERENTIAL (CANCER CENTER ONLY)
Abs Immature Granulocytes: 0.4 10*3/uL — ABNORMAL HIGH (ref 0.00–0.07)
Band Neutrophils: 5 %
Basophils Absolute: 0 10*3/uL (ref 0.0–0.1)
Basophils Relative: 0 %
Blasts: 16 %
Eosinophils Absolute: 0 10*3/uL (ref 0.0–0.5)
Eosinophils Relative: 0 %
HCT: 22.7 % — ABNORMAL LOW (ref 39.0–52.0)
Hemoglobin: 8 g/dL — ABNORMAL LOW (ref 13.0–17.0)
Lymphocytes Relative: 39 %
Lymphs Abs: 1.1 10*3/uL (ref 0.7–4.0)
MCH: 30.2 pg (ref 26.0–34.0)
MCHC: 35.2 g/dL (ref 30.0–36.0)
MCV: 85.7 fL (ref 80.0–100.0)
Metamyelocytes Relative: 5 %
Monocytes Absolute: 0.2 10*3/uL (ref 0.1–1.0)
Monocytes Relative: 7 %
Myelocytes: 8 %
Neutro Abs: 0.7 10*3/uL — ABNORMAL LOW (ref 1.7–7.7)
Neutrophils Relative %: 20 %
Platelet Count: 25 10*3/uL — ABNORMAL LOW (ref 150–400)
RBC: 2.65 MIL/uL — ABNORMAL LOW (ref 4.22–5.81)
RDW: 14.1 % (ref 11.5–15.5)
Smear Review: DECREASED
WBC Count: 2.8 10*3/uL — ABNORMAL LOW (ref 4.0–10.5)
nRBC: 0 % (ref 0.0–0.2)

## 2022-01-07 LAB — SAMPLE TO BLOOD BANK

## 2022-01-07 LAB — PREPARE RBC (CROSSMATCH)

## 2022-01-07 MED ORDER — ONDANSETRON HCL 8 MG PO TABS
8.0000 mg | ORAL_TABLET | Freq: Once | ORAL | Status: AC
  Administered 2022-01-07: 8 mg via ORAL
  Filled 2022-01-07: qty 1

## 2022-01-07 MED ORDER — SODIUM CHLORIDE 0.9% IV SOLUTION
250.0000 mL | Freq: Once | INTRAVENOUS | Status: AC
  Administered 2022-01-07: 250 mL via INTRAVENOUS

## 2022-01-07 MED ORDER — AZACITIDINE CHEMO SQ INJECTION
75.0000 mg/m2 | Freq: Once | INTRAMUSCULAR | Status: AC
  Administered 2022-01-07: 117.5 mg via SUBCUTANEOUS
  Filled 2022-01-07: qty 4.7

## 2022-01-07 NOTE — Progress Notes (Signed)
Per Dr. Burr Medico, "OK To Use BMP from hospital visit on 01/04/2022".  Pt will get 1 unit of PRBCs and Azacitidine today.

## 2022-01-07 NOTE — Progress Notes (Signed)
Beth in blood bank confirmed orders in Epic and will contact infusion RN when blood is ready.

## 2022-01-08 ENCOUNTER — Inpatient Hospital Stay (HOSPITAL_BASED_OUTPATIENT_CLINIC_OR_DEPARTMENT_OTHER): Payer: Medicare Other | Admitting: Nurse Practitioner

## 2022-01-08 ENCOUNTER — Inpatient Hospital Stay: Payer: Medicare Other

## 2022-01-08 ENCOUNTER — Encounter: Payer: Self-pay | Admitting: Nurse Practitioner

## 2022-01-08 ENCOUNTER — Ambulatory Visit: Payer: Medicare Other

## 2022-01-08 VITALS — BP 130/81 | HR 92 | Temp 98.4°F | Resp 19

## 2022-01-08 VITALS — Temp 98.4°F

## 2022-01-08 DIAGNOSIS — R634 Abnormal weight loss: Secondary | ICD-10-CM

## 2022-01-08 DIAGNOSIS — R530 Neoplastic (malignant) related fatigue: Secondary | ICD-10-CM

## 2022-01-08 DIAGNOSIS — Z5111 Encounter for antineoplastic chemotherapy: Secondary | ICD-10-CM | POA: Diagnosis not present

## 2022-01-08 DIAGNOSIS — Z7189 Other specified counseling: Secondary | ICD-10-CM

## 2022-01-08 DIAGNOSIS — D469 Myelodysplastic syndrome, unspecified: Secondary | ICD-10-CM

## 2022-01-08 DIAGNOSIS — G893 Neoplasm related pain (acute) (chronic): Secondary | ICD-10-CM

## 2022-01-08 DIAGNOSIS — R531 Weakness: Secondary | ICD-10-CM

## 2022-01-08 DIAGNOSIS — Z515 Encounter for palliative care: Secondary | ICD-10-CM

## 2022-01-08 LAB — PATHOLOGIST SMEAR REVIEW

## 2022-01-08 MED ORDER — ONDANSETRON HCL 8 MG PO TABS
8.0000 mg | ORAL_TABLET | Freq: Once | ORAL | Status: AC
  Administered 2022-01-08: 8 mg via ORAL
  Filled 2022-01-08: qty 1

## 2022-01-08 MED ORDER — DULOXETINE HCL 20 MG PO CPEP: 20.0000 mg | ORAL_CAPSULE | Freq: Two times a day (BID) | ORAL | 0 refills | Status: AC

## 2022-01-08 MED ORDER — AZACITIDINE CHEMO SQ INJECTION
75.0000 mg/m2 | Freq: Once | INTRAMUSCULAR | Status: AC
  Administered 2022-01-08: 117.5 mg via SUBCUTANEOUS
  Filled 2022-01-08: qty 4.7

## 2022-01-08 MED ORDER — HYDROCODONE-ACETAMINOPHEN 5-325 MG PO TABS: 1.0000 | ORAL_TABLET | ORAL | 0 refills | Status: DC | PRN

## 2022-01-08 NOTE — Progress Notes (Signed)
Per Dr Burr Medico, ok to treat with PLT 25 and ANC 0.7

## 2022-01-08 NOTE — Progress Notes (Signed)
[facility]  [phone] [phone]) [phone]   Name: Eugene Cooper Date: 01/08/2022 MRN: [id number]  DOB: Jun 09, 1941  Patient Care Team: Emelia Loron, NP as PCP - General (Nurse Practitioner) Jettie Booze, MD as PCP - Cardiology (Cardiology) Truitt Merle, MD as Consulting Physician (Hematology)    REASON FOR CONSULTATION: Eugene Cooper is a 81 y.o. male with medical history of MDS, diabetes, stroke, SIRS, hyperlipidemia, malnutrition, LBBB, gout, atrial fibrillation, and knee pain .  Palliative ask to see for symptom management and goals of care.    SOCIAL HISTORY:     reports that he has never smoked. He has never used smokeless tobacco. He reports that he does not currently use alcohol. He reports that he does not use drugs.  ADVANCE DIRECTIVES:  None on file, introduced conversations. Patient is aware in the absence of no directives all available children/spouse will be medical decision maker.   CODE STATUS: ongoing discussions  PAST MEDICAL HISTORY: Past Medical History:  Diagnosis Date   CKD (chronic kidney disease), stage III ([place])    Diabetes ([place])    Hyperlipemia    Hypertension    LBBB (left bundle branch block)    Stroke ([place])     PAST SURGICAL HISTORY:  Past Surgical History:  Procedure Laterality Date   BIOPSY  10/29/2021   Procedure: BIOPSY;  Surgeon: Thornton Park, MD;  Location: [facility];  Service: Gastroenterology;;   ESOPHAGOGASTRODUODENOSCOPY (EGD) WITH PROPOFOL N/A 10/29/2021   Procedure: ESOPHAGOGASTRODUODENOSCOPY (EGD) WITH PROPOFOL;  Surgeon: Thornton Park, MD;  Location: [facility];  Service: Gastroenterology;  Laterality: N/A;   IR US GUIDE BX ASP/DRAIN  01/01/2022    HEMATOLOGY/ONCOLOGY HISTORY:  Oncology History  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  MEDICATIONS:  Current Outpatient Medications  Medication Sig Dispense Refill   DULoxetine (CYMBALTA) 20 MG capsule Take 1 capsule (20 mg total) by mouth 2 (two) times daily. 60 capsule 0   Accu-Chek Softclix Lancets lancets USE UPTO FOUR TIMES DAILY AS DIRECTED     acetaminophen (TYLENOL) 325 MG tablet Take 1-2 tablets (325-650 mg total) by mouth every 4 (four) hours as needed for mild pain.     allopurinol (ZYLOPRIM) 300 MG tablet Take 1 tablet (300 mg total) by mouth daily. 30 tablet 5   atorvastatin (LIPITOR) 40 MG tablet Take 1 tablet (40 mg total) by mouth daily at 6 PM. 30 tablet 0   blood glucose meter kit and supplies KIT Dispense based on patient and insurance preference. Use up to four times daily as directed. (FOR ICD-9 250.00, 250.01). 1 each 0   colchicine 0.6 MG tablet Take 1 tablet (0.6 mg total) by mouth 2 (two) times daily. 30 tablet 0   diclofenac Sodium (VOLTAREN) 1 % GEL Apply 2 g topically 4 (four) times daily. (Patient taking differently: Apply 2 g topically 4 (four) times daily as needed (knee/ankle pain).) 150 g 1   HYDROcodone-acetaminophen (NORCO) 5-325 MG tablet Take 1 tablet by mouth every 4 (four) hours as needed for moderate pain. 60 tablet 0   magnesium oxide (MAG-OX) 400 (240 Mg) MG tablet Take 0.5 tablets (200 mg total) by mouth 2 (two) times daily. 10 tablet 0   metFORMIN (GLUCOPHAGE) 1000 MG  tablet Take 1,000 mg by mouth 2 (two) times daily.     ondansetron (ZOFRAN) 8 MG tablet Take 1 tablet (8 mg total) by mouth 2 (two) times daily as needed (Nausea or vomiting). (Patient not taking: Reported on 01/01/2022) 30 tablet 1   pantoprazole (PROTONIX) 40 MG tablet Take 1 tablet (40 mg total) by mouth 2 (two) times daily. 30 tablet 1   No current facility-administered medications for this visit.    VITAL SIGNS: BP 130/81 (BP Location: Right Arm, Patient  Position: Sitting)    Pulse 92    Temp 98.4 F (36.9 C)    Resp 19    SpO2 100%  Filed Weights    Estimated body mass index is 22.34 kg/m as calculated from the following:   Height as of 01/07/22: 5' 2"  (1.575 m).   Weight as of 01/01/22: 122 lb 2.2 oz (55.4 kg).  LABS: CBC:    Component Value Date/Time   WBC 2.8 (L) 01/07/2022 0829   WBC 1.9 (L) 01/04/2022 0902   HGB 8.0 (L) 01/07/2022 0829   HCT 22.7 (L) 01/07/2022 0829   PLT 25 (L) 01/07/2022 0829   MCV 85.7 01/07/2022 0829   NEUTROABS 0.7 (L) 01/07/2022 0829   LYMPHSABS 1.1 01/07/2022 0829   MONOABS 0.2 01/07/2022 0829   EOSABS 0.0 01/07/2022 0829   BASOSABS 0.0 01/07/2022 0829   Comprehensive Metabolic Panel:    Component Value Date/Time   NA 133 (L) 01/04/2022 0522   NA 140 03/30/2020 1055   K 4.0 01/04/2022 0522   CL 98 01/04/2022 0522   CO2 28 01/04/2022 0522   BUN 16 01/04/2022 0522   BUN 10 03/30/2020 1055   CREATININE 0.61 01/04/2022 0522   CREATININE 0.89 12/13/2021 1042   GLUCOSE 164 (H) 01/04/2022 0522   CALCIUM 8.2 (L) 01/04/2022 0522   AST 27 01/02/2022 0514   AST 14 (L) 12/13/2021 1042   ALT 25 01/02/2022 0514   ALT 12 12/13/2021 1042   ALKPHOS 106 01/02/2022 0514   BILITOT 1.1 01/02/2022 0514   BILITOT 0.9 12/13/2021 1042   PROT 5.7 (L) 01/02/2022 0514   PROT 6.7 05/16/2020 0948   ALBUMIN 2.9 (L) 01/02/2022 0514   ALBUMIN 4.3 05/16/2020 0948    RADIOGRAPHIC STUDIES: IR US Guide Bx Asp/Drain  Result Date: 01/01/2022 CLINICAL DATA:  Status post attempted and failed right knee joint aspiration at Urgent Care. Right knee pain, suprapatellar bursal effusion seen on previous x-ray. Request for aspiration of the effusion. EXAM: ULTRASOUND GUIDED RIGHT KNEE JOINT ASPIRATION PROCEDURE: After a thorough discussion of risks and benefits of the procedure including the general risks of bleeding, infection, damage to adjacent structures, written consent was obtained. Specific risks of this procedure included  false positive and false negative results and introduction of infection into the joint. Time-out form was completed when appropriate. The right knee was examined and scanned by ultrasound. The skin was prepped and draped in the usual sterile fashion with Chloraprep. Local anesthesia and deep anesthesia was provided with 1% lidocaine without epinephrine. Careful attention was paid not to inject bacteriostatic lidocaine into the joint. Under direct ultrasound guidance, a 20 gauge spinal needle was advanced into the suprapatellar space of the right knee joint from a lateral approach. Aspiration was performed which yielded 8 mL of serosanguinous fluid. Fluid was sent for requested lab analysis. The needle was removed and a dressing was placed. IMPRESSION: Technically successful right knee aspiration at the level of a suprapatellar joint effusion yielding  8 mL of serosanguinous fluid. Read by: Durenda Guthrie, PA-C Electronically Signed   By: Aletta Edouard M.D.   On: 01/01/2022 14:56   DG Knee Complete 4 Views Left  Result Date: 12/31/2021 CLINICAL DATA:  Left knee pain. EXAM: LEFT KNEE - COMPLETE 4+ VIEW COMPARISON:  None FINDINGS: There is no acute fracture or dislocation. Mild osteopenia. Moderate arthritic changes with tricompartmental narrowing. No joint effusion. The soft tissues are unremarkable. IMPRESSION: 1. No acute fracture or dislocation. 2. Moderate arthritic changes. Electronically Signed   By: Anner Crete M.D.   On: 12/31/2021 21:12   DG Knee Complete 4 Views Right  Result Date: 12/31/2021 CLINICAL DATA:  Redness and swelling at both knees. Possible septic arthropathy. EXAM: RIGHT KNEE - COMPLETE 4+ VIEW COMPARISON:  AP Lat right knee 11/01/2021. FINDINGS: There is mild circumferential soft tissue fullness. There is a minimal suprapatellar bursal effusion on lateral view There is mild osteopenia. No fracture is evident. There are mild features of nonerosive tricompartmental degenerative arthrosis  consisting of mild femorotibial and patellofemoral joint space loss and small tricompartmental marginal spurs. There is enthesopathic spurring of the anterior superior patella which was also seen previously. There is a fabella posterolaterally. Early degenerative arthrosis of the proximal tibiofibular joint. IMPRESSION: 1. Mild generalized soft tissue swelling. 2. Minimal suprapatellar bursal effusion. 3. 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 4. Only noteworthy interval change since 11/01/2021 is the superficial soft tissue prominence. Electronically Signed   By: Telford Nab M.D.   On: 12/31/2021 21:16    PERFORMANCE STATUS (ECOG) : 3 - Symptomatic, >50% confined to bed  Review of Systems  Constitutional:  Positive for appetite change.  Musculoskeletal:        Bilateral knee pain   Neurological:  Positive for weakness.  Unless otherwise noted, a complete review of systems is negative.  Physical Exam General: NAD, weak, ill-appearing Cardiovascular: regular rate and rhythm Pulmonary: clear ant fields Abdomen: soft, nontender, + bowel sounds Extremities: no edema, no joint deformities Skin: no rashes Neurological: Weakness but otherwise nonfocal, in a wheelchair   IMPRESSION:  This is my initial visit with Mr. Mobley. His daughter, Joaquim Lai is present as well as interpretor, Almyra Free. He is sitting in a wheelchair. Weak appearing.   I introduced myself, Advertising copywriter, and Palliative's role in collaboration with the oncology team. Concept of Palliative Care was introduced as specialized medical care for people and their families living with serious illness.  It focuses on providing relief from the symptoms and stress of a serious illness.  The goal is to improve quality of life for both the patient and the family. Values and goals of care important to patient and family were attempted to be elicited.   Mr. Lomeli is from [place]. He has 8 children. 2 of his  children and his wife lives in [place]. His wife is currently here visiting on a temporary visa that is valid for 1 month. He worked in Architect.   Several months prior patient was ambulatory with a walker or cane. He is now confined to the chair or bed. Daughter shares he spends most of his time in the bed or lift chair. She and her brother Jacqulyn Bath are his main caregivers and rotate caring for him.   Bilateral uncontrolled knee pain Mr. Alperin and his daughter shares concerns regarding his ongoing bilateral knee pain. Joaquim Lai shares he has undergone imaging and medications for gout with little to no relief. Patient is emotional expressing  his ongoing discomfort. He reports pain is relieved with Hydrocodone. We discussed use of pain medication with plans to continue for support and comfort. Education provided regarding use of Cymbalta to also assist with pain. We discussed extended relief morphine to aid in pain relief. We will plan to closely evaluate pain over the next week and make adjustments as needed.   Decreased appetite/weight loss  His appetite remains minimal. Daughter reports he is able to tolerate Ensure 2x/day. He reports feelings of fullness after eating only a small amount. He has been unable to stand to get current weight however he has loss approximately 35-40lbs over the past 6-9 months.    Goals of care  We discussed Her current illness and what it means in the larger context of Her on-going co-morbidities. Natural disease trajectory and expectations were discussed.   I created space and opportunity for patient and his daughter to express feelings. Joaquim Lai is emotional expressing she does not wish to discuss at this time. Family is hopeful and would want all measures taken for her dad at this time. States they try not to discuss "certain things" with him. Acknowledge request and offered support. We will consider discussions in the near future with family and offer support.    Patient and daughter is expressing their appreciation of patient's wife being able to visit. Joaquim Lai is asking for a letter to allow her mother to remain in the [place] for a longer period of time in addition to a letter to the [place] to obtain a humanitarian visa for patient's daughter who resides in [place] also. Name and information has been provided.   I discussed the importance of continued conversation with family and their medical providers regarding overall plan of care and treatment options, ensuring decisions are within the context of the patients values and GOCs.  PLAN: Norco 5/351m every 4 hours as needed for pain Cymbalta 40 mg daily  Encouraged ongoing goals of care discussions. Patient's prognosis is poor. Will continue to offer support. Daughter did not wish to continue discussions at this time. Will plan to discuss privately at later date.  Letter to be written offering support and recommendations for patient's wife and daughter to be allowed to visit for extended period of time given patient's condition and prognosis.  I will plan to see patient back in 1-2 weeks in collaboration to other oncology appointments.    Patient expressed understanding and was in agreement with this plan. He also understands that He can call the clinic at any time with any questions, concerns, or complaints.   Time Total: 55 min.   Visit consisted of counseling and education dealing with the complex and emotionally intense issues of symptom management and palliative care in the setting of serious and potentially life-threatening illness.Greater than 50%  of this time was spent counseling and coordinating care related to the above assessment and plan.  Signed by: NAlda Lea AGPCNP-BC Palliative Medicine Team

## 2022-01-08 NOTE — Patient Instructions (Signed)
Azacitidine suspension for injection (subcutaneous use) Qu es Coca[employer]a? La AZACITIDINA es un agente quimioteraputico. Este medicamento reduce el crecimiento de clulas cancerosas y puede suprimir el sistema inmunolgico. Se utiliza tambin para el tratamiento de sndrome mielodisplsticos o algunos tipos de leucemia. Este medicamento puede ser utilizado para otros usos; si tiene alguna pregunta consulte con su proveedor de atencin mdica o con su farmacutico. MARCAS COMUNES: [place]idaza Qu le debo informar a mi profesional de la salud antes de tomar este medicamento? Necesitan saber si usted presenta alguno de los siguientes problemas o situaciones: enfermedad renal enfermedad heptica tumores hepticos una reaccin alrgica o inusual a la azacitidina, al manitol, a otros medicamentos, alimentos, colorantes o conservantes si est embarazada o buscando quedar embarazada si est amamantando a un beb Cmo debo utilizar este medicamento? Este medicamento se administra mediante inyeccin por va subcutnea. Lo administra un profesional de la salud calificado en un hospital o en un entorno clnico. Hable con su pediatra para informarse acerca del uso de este medicamento en nios. Puede requerir atencin especial. Sobredosis: Pngase en contacto inmediatamente con un centro toxicolgico o una sala de urgencia si usted cree que haya tomado demasiado medicamento. ATENCIN: [employer] es solo para usted. No comparta este medicamento con nadie. Qu sucede si me olvido de una dosis? Es importante no olvidar ninguna dosis. Informe a su mdico o a su profesional de la salud si no puede asistir a Photographer. Qu puede interactuar con este medicamento? No se han estudiado las interacciones. Suministre a Conservation officer, historic buildings de atencin mdica una lista de todos los [place], hierbas, medicamentos de [place], o suplementos dietticos que [place]. Dgales tambin si fuma, bebe alcohol, o  utiliza drogas ilegales. Algunos elementos pueden interactuar con su medicamento. Puede ser que esta lista no menciona todas las posibles interacciones. Informe a su profesional de [employer]	[place] de [employer] productos a base de hierbas, medicamentos de [place] o suplementos nutritivos que est tomando. Si usted fuma, consume bebidas alcohlicas o si utiliza drogas ilegales, indqueselo tambin a su profesional de KB Home	[place]. Algunas sustancias pueden interactuar con su medicamento. A qu debo estar atento al usar Coca-Cola? Visite a su mdico para que revise su evolucin. Este medicamento podra hacerle sentir un Nurse, mental health. Esto es normal ya que la quimioterapia puede Print production planner tanto a las clulas sanas como a las clulas cancerosas. Si presenta algn efecto secundario, infrmelo. Contine con el tratamiento aun si se siente enfermo, a menos que su mdico le indique que lo suspenda. En algunos casos, podra recibir [employer] para ayudarlo con los efectos secundarios. Siga todas las instrucciones para usarlos. Consulte a su mdico o a su profesional de la salud si tiene fiebre, escalofros o dolor de garganta, o cualquier otro sntoma de resfro o gripe. No se trate usted mismo. Este medicamento reduce la capacidad del cuerpo para combatir infecciones. Trate de no acercarse a personas que estn enfermas. Este medicamento podra aumentar el riesgo de moretones o sangrado. Consulte a su mdico o a su profesional de la salud si observa sangrados inusuales. Usted podra necesitar realizarse [employer] de sangre mientras est usando [place]. No debe quedar embarazada mientras est tomando este medicamento y por 6 meses despus de la ltima dosis. Las mujeres deben informar a su mdico si estn buscando quedar embarazadas o si creen que podran estar embarazadas. Los hombres no deben tener hijos mientras estn recibiendo Coca-Cola y durante 3 meses despus de la ltima dosis.  Existe la  posibilidad de efectos secundarios graves en un beb sin nacer. Para obtener ms informacin, hable con su profesional de la salud o su farmacutico. No debe amamantar a un beb mientras est usando este medicamento y por 1 semana despus de la ltima dosis. Este medicamento puede interferir con la capacidad de tener hijos. Hable con su mdico o su profesional de la salud si est preocupado por su fertilidad. Qu efectos secundarios puedo tener al [employer] este medicamento? Efectos secundarios que debe informar a su mdico o a Barrister's clerk de la salud tan pronto como sea posible: Chief of Staff, como erupcin cutnea, comezn/picazn o urticarias, e hinchazn de la cara, los labios o la lengua recuentos sanguneos bajos: este medicamento podra reducir la cantidad de glbulos blancos, glbulos rojos y plaquetas. Su riesgo de infeccin y sangrado podra ser mayor. signos de infeccin: fiebre o escalofros, tos, dolor de garganta, dolor al orinar signos de disminucin en la cantidad de plaquetas o sangrado: moretones, puntos rojos en la piel, heces de color negro y aspecto alquitranado, sangre en la orina signos de disminucin en la cantidad de glbulos rojos: cansancio o debilidad inusual, Youth worker, aturdimiento signos y sntomas de lesin al rin, tales como dificultad para orinar o cambios en la cantidad de orina signos y sntomas de lesin al hgado, como orina amarilla oscura o [place]; sensacin general de estar enfermo o sntomas gripales; heces claras; prdida de apetito; nuseas; dolor en la regin abdominal superior derecha; cansancio o debilidad inusual; color amarillento de los ojos o la piel Efectos secundarios que generalmente no requieren atencin mdica (infrmelos a su mdico o a su profesional de la salud si persisten o si son molestos): estreimiento diarrea nuseas, vmito dolor o enrojecimiento en TEFL teacher de la inyeccin cansancio o debilidad inusual Puede ser  que esta lista no menciona todos los posibles efectos secundarios. Comunquese a su mdico por asesoramiento mdico [employer]. Usted puede informar los efectos secundarios a la FDA por telfono al 1-800-FDA-1088. Dnde debo guardar mi medicina? Este medicamento se administra en hospitales o clnicas y no necesitar guardarlo en su domicilio. ATENCIN: Este folleto es un resumen. Puede ser que no cubra toda la posible informacin. Si usted tiene preguntas acerca de esta medicina, consulte con su mdico, su farmacutico o su profesional de Technical sales engineer.  2022 Elsevier/Gold Standard (2017-02-06 00:00:00)

## 2022-01-09 ENCOUNTER — Other Ambulatory Visit: Payer: Self-pay

## 2022-01-09 ENCOUNTER — Inpatient Hospital Stay: Payer: Medicare Other | Attending: Hematology

## 2022-01-09 ENCOUNTER — Ambulatory Visit: Payer: Medicare Other

## 2022-01-09 VITALS — BP 148/84 | HR 86 | Temp 98.4°F | Resp 16

## 2022-01-09 DIAGNOSIS — Z5111 Encounter for antineoplastic chemotherapy: Secondary | ICD-10-CM | POA: Diagnosis not present

## 2022-01-09 DIAGNOSIS — D4622 Refractory anemia with excess of blasts 2: Secondary | ICD-10-CM | POA: Insufficient documentation

## 2022-01-09 DIAGNOSIS — D469 Myelodysplastic syndrome, unspecified: Secondary | ICD-10-CM

## 2022-01-09 LAB — PREPARE RBC (CROSSMATCH)

## 2022-01-09 LAB — SURGICAL PATHOLOGY

## 2022-01-09 MED ORDER — AZACITIDINE CHEMO SQ INJECTION
75.0000 mg/m2 | Freq: Once | INTRAMUSCULAR | Status: AC
  Administered 2022-01-09: 117.5 mg via SUBCUTANEOUS
  Filled 2022-01-09: qty 4.7

## 2022-01-09 MED ORDER — ONDANSETRON HCL 8 MG PO TABS
8.0000 mg | ORAL_TABLET | Freq: Once | ORAL | Status: AC
  Administered 2022-01-09: 8 mg via ORAL
  Filled 2022-01-09: qty 1

## 2022-01-09 MED ORDER — HYDROCODONE-ACETAMINOPHEN 5-325 MG PO TABS: 1.0000 | ORAL_TABLET | ORAL | 0 refills | Status: DC | PRN

## 2022-01-09 NOTE — Progress Notes (Signed)
[facility] in [place] will prepare 1 unit of PRBCs for transfusion on 01/10/2022 if pt's CBC drawn on 01/10/2022 shows need for PRBCs.  Plts can be ordered at anytime.

## 2022-01-09 NOTE — Addendum Note (Signed)
Addended by: Jimmy Footman on: 01/09/2022 12:31 PM   Modules accepted: Orders

## 2022-01-10 ENCOUNTER — Ambulatory Visit: Payer: Medicare Other

## 2022-01-10 ENCOUNTER — Inpatient Hospital Stay: Payer: Medicare Other

## 2022-01-10 ENCOUNTER — Other Ambulatory Visit: Payer: Self-pay

## 2022-01-10 ENCOUNTER — Telehealth: Payer: Self-pay | Admitting: Hematology

## 2022-01-10 DIAGNOSIS — D469 Myelodysplastic syndrome, unspecified: Secondary | ICD-10-CM

## 2022-01-10 DIAGNOSIS — Z5111 Encounter for antineoplastic chemotherapy: Secondary | ICD-10-CM | POA: Diagnosis not present

## 2022-01-10 LAB — CBC WITH DIFFERENTIAL (CANCER CENTER ONLY)
Abs Immature Granulocytes: 0.3 10*3/uL — ABNORMAL HIGH (ref 0.00–0.07)
Basophils Absolute: 0 10*3/uL (ref 0.0–0.1)
Basophils Relative: 0 %
Blasts: 16 %
Eosinophils Absolute: 0 10*3/uL (ref 0.0–0.5)
Eosinophils Relative: 0 %
HCT: 28.5 % — ABNORMAL LOW (ref 39.0–52.0)
Hemoglobin: 9.7 g/dL — ABNORMAL LOW (ref 13.0–17.0)
Lymphocytes Relative: 35 %
Lymphs Abs: 0.7 10*3/uL (ref 0.7–4.0)
MCH: 29.6 pg (ref 26.0–34.0)
MCHC: 34 g/dL (ref 30.0–36.0)
MCV: 86.9 fL (ref 80.0–100.0)
Metamyelocytes Relative: 5 %
Monocytes Absolute: 0.2 10*3/uL (ref 0.1–1.0)
Monocytes Relative: 10 %
Myelocytes: 9 %
Neutro Abs: 0.5 10*3/uL — ABNORMAL LOW (ref 1.7–7.7)
Neutrophils Relative %: 25 %
Platelet Count: 7 10*3/uL — CL (ref 150–400)
RBC: 3.28 MIL/uL — ABNORMAL LOW (ref 4.22–5.81)
RDW: 14 % (ref 11.5–15.5)
Smear Review: NORMAL
WBC Count: 2 10*3/uL — ABNORMAL LOW (ref 4.0–10.5)
nRBC: 0 % (ref 0.0–0.2)

## 2022-01-10 LAB — CMP (CANCER CENTER ONLY)
ALT: 40 U/L (ref 0–44)
AST: 17 U/L (ref 15–41)
Albumin: 3.3 g/dL — ABNORMAL LOW (ref 3.5–5.0)
Alkaline Phosphatase: 234 U/L — ABNORMAL HIGH (ref 38–126)
Anion gap: 8 (ref 5–15)
BUN: 18 mg/dL (ref 8–23)
CO2: 27 mmol/L (ref 22–32)
Calcium: 9.1 mg/dL (ref 8.9–10.3)
Chloride: 97 mmol/L — ABNORMAL LOW (ref 98–111)
Creatinine: 0.75 mg/dL (ref 0.61–1.24)
GFR, Estimated: >60 mL/min (ref >60)
Glucose, Bld: 213 mg/dL — ABNORMAL HIGH (ref 70–99)
Potassium: 4.2 mmol/L (ref 3.5–5.1)
Sodium: 132 mmol/L — ABNORMAL LOW (ref 135–145)
Total Bilirubin: 0.9 mg/dL (ref 0.3–1.2)
Total Protein: 6.1 g/dL — ABNORMAL LOW (ref 6.5–8.1)

## 2022-01-10 LAB — BPAM RBC
Blood Product Expiration Date: 202303012359
Blood Product Expiration Date: 202303072359
ISSUE DATE / TIME: 202301301110
Unit Type and Rh: 5100
Unit Type and Rh: 5100

## 2022-01-10 LAB — TYPE AND SCREEN
ABO/RH(D): O POS
Antibody Screen: NEGATIVE
Unit division: 0
Unit division: 0

## 2022-01-10 LAB — FLOW CYTOMETRY

## 2022-01-10 MED ORDER — SODIUM CHLORIDE 0.9% IV SOLUTION
250.0000 mL | Freq: Once | INTRAVENOUS | Status: AC
  Administered 2022-01-10: 250 mL via INTRAVENOUS

## 2022-01-10 NOTE — Patient Instructions (Addendum)
Transfusin de plaquetas Platelet Transfusion La transfusin de plaquetas es un procedimiento en el cual una persona recibe plaquetas de un donante a travs de una va intravenosa. Las plaquetas son fragmentos de la sangre que se unen y forman un cogulo para ayudar a que el cuerpo deje de Therapist, art despus de una lesin. Si tiene [employer], es posible que su sangre tenga problemas para Designer, fashion/clothing. Esto puede causar sangrado y moretones con mucha facilidad. Tal vez deba recibir una transfusin de plaquetas si tiene una enfermedad que reduce el nmero de plaquetas (trombocitopenia). Se puede usar una transfusin de plaquetas para detener o evitar el sangrado excesivo. Informe al mdico acerca de lo siguiente: Las reacciones que haya tenido durante transfusiones previas. Cualquier alergia que tenga. Todos los [employer] [place], incluidos vitaminas, hierbas, gotas oftlmicas, cremas y medicamentos de venta libre. Cualquier problema de la sangre que tenga. Cirugas a las que se haya sometido. Cualquier afeccin mdica que tenga. Si est embarazada o podra estarlo. Cules son los riesgos? En general, se trata de un procedimiento seguro. No obstante, pueden ocurrir complicaciones, por ejemplo: Cristy Hilts. Infeccin. Reaccin alrgica a las plaquetas donadas (de un donante). Que el sistema de su cuerpo que combate las enfermedades (sistema inmunitario) ataque las plaquetas del donante (reaccin hemoltica). Esto es poco frecuente. Una reaccin poco frecuente que causa dao pulmonar (lesin pulmonar aguda producida por transfusin). Qu ocurre antes del procedimiento? Medicamentos Consulte al mdico si debe hacer o no lo siguiente: Quarry manager o suspender los medicamentos que [place] habitualmente. Esto es muy importante si toma medicamentos para la diabetes o anticoagulantes. Tomar medicamentos como aspirina e ibuprofeno. Estos medicamentos pueden tener un efecto anticoagulante en la [place]. No tome  estos medicamentos a menos que el mdico se lo indique. Usar medicamentos de venta libre, vitaminas, hierbas y suplementos. Indicaciones generales Se le realizar un anlisis de sangre para determinar su grupo sanguneo. Su tipo de sangre determina qu tipo de plaquetas recibir. Siga las instrucciones del mdico respecto de las restricciones en las comidas o las bebidas. Si tuvo una reaccin alrgica a una transfusin en el pasado, tal vez le administren un medicamento que ayude a evitarla. Le controlarn la temperatura, la presin arterial, el pulso y la respiracin. Qu ocurre durante el procedimiento?  Le colocarn una va intravenosa (i.v.) en una vena. Por su seguridad, dos mdicos verificarn su identidad y las plaquetas del donante que se le van a infundir. Una bolsa con las plaquetas del donante se conectar a su va intravenosa. Las plaquetas ingresarn en su torrente sanguneo. Esto generalmente demora entre 30 y 51 minutos. Durante la transfusin, le controlarn la temperatura, la presin arterial, el pulso y la respiracin. Esto ayuda a detectar signos tempranos de una reaccin. Tambin lo controlarn para Hydrographic surveyor otros sntomas que puedan indicar una reaccin, como escalofros, urticaria o picazn. Si tiene signos de Freight forwarder, se suspender la transfusin y tal vez le administren un medicamento para Producer, television/film/video. Una vez finalizada la transfusin, se retirar la va intravenosa. Se puede aplicar presin en el lugar de la va intravenosa (i.v.) para detener cualquier sangrado. El lugar de la va intravenosa (i.v.) se cubrir con un vendaje (venda). El procedimiento puede variar segn el mdico y el hospital. Qu puedo esperar despus del procedimiento? Le controlarn la presin arterial, la temperatura, el pulso y la respiracin hasta que abandone el hospital o la clnica. Es posible que tenga algunos moretones y Information systems manager de la  va  intravenosa (i.v.). Siga estas indicaciones en su casa: Medicamentos Use los medicamentos de venta libre y los recetados solamente como se lo haya indicado el mdico. Hable con el mdico antes de tomar cualquier medicamento que contenga aspirina o antiinflamatorios no esteroideos (AINE), como el ibuprofeno. Estos medicamentos aumentan el riesgo de tener una hemorragia peligrosa. Cuidado del lugar de la va intravenosa (i.v.) Controle el lugar de la va intravenosa (i.v.) [employer] para descartar signos de infeccin. Est atento a los siguientes signos: Enrojecimiento, Land. Lquido o sangre. Si sale lquido o sangre del Environmental consultant de la va intravenosa (i.v.), ejerza presin firme con las manos sobre el vendaje que cubre la zona durante uno o dos minutos. Al hacerlo, debera detenerse el sangrado. Calor. Pus o mal olor. Indicaciones generales [place] o retire Secretary/administrator se lo haya indicado el mdico. Retome sus actividades normales como se lo haya indicado el mdico. Pregntele al mdico qu actividades son seguras para usted. No tome baos de inmersin, no nade ni use el jacuzzi hasta que el mdico lo autorice. Pregntele al mdico si puede ducharse. Concurra a [place]. Esto es importante. Comunquese con un mdico si: Siente un dolor de cabeza que no se alivia con medicamentos. Tiene ronchas, una erupcin cutnea o picazn en la piel. Tiene nuseas o vmitos. Se siente cansado o dbil de un modo que no es habitual. Tiene signos de infeccin en el lugar de la va intravenosa (i.v.). Solicite ayuda de inmediato si: Tiene fiebre o escalofros. Orina con menos frecuencia que lo habitual. La orina es de un color ms oscuro que lo normal. Tiene alguno de los siguientes sntomas: Dificultad para Ambulance person. Dolor en la espalda, el abdomen o el pecho. Piel fra y hmeda. Latidos cardacos acelerados. Resumen Las plaquetas son fragmentos diminutos de  clulas sanguneas que se agrupan para formar un cogulo de sangre cuando se produce una herida. Si tiene [employer], es posible que su sangre tenga problemas para Designer, fashion/clothing. La transfusin de plaquetas es un procedimiento en el cual se reciben plaquetas de un donante a travs de una va intravenosa. Se puede usar una transfusin de plaquetas para detener o evitar el sangrado excesivo. Despus del procedimiento, controle el lugar de la va intravenosa (i.v.) todos los das para descartar signos de infeccin. Esta informacin no tiene Marine scientist el consejo del mdico. Asegrese de hacerle al mdico cualquier pregunta que tenga. Document Revised: 06/27/2021 Document Reviewed: 06/27/2021 Elsevier Patient Education  [place].

## 2022-01-10 NOTE — Progress Notes (Signed)
[place] in Blood bank preparing 1 unit of Plts and canceling 1 unit of PRBCs.

## 2022-01-10 NOTE — Progress Notes (Signed)
No Vidaza per Dr Burr Medico for today 01/10/22 or for 01/11/2022, pt and family aware. 1 U of platelets only for today, pt tolerated well.

## 2022-01-10 NOTE — Telephone Encounter (Signed)
Pt's peripheral blood smear has showed 16% blasts this week (twice), peripheral blood flow showed 26% blasts.  This is highly concerning for acute leukemia transition.  I discussed the findings with patient and his daughter in infusion room today.  His platelet counts down to 5K again today, he will receive platelet transfusion.  I recommend stopping chemotherapy, and transition to palliative care and hospice.  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  Patient's daughter would like to talk to the rest of family.  We will have a family meeting on his next appointment on Monday 2/6, along with palliative care nurse practitioner Lexine Baton, to finalize his plan next. Will cancel his chemo tomorrow.   Truitt Merle  01/10/2022

## 2022-01-11 ENCOUNTER — Other Ambulatory Visit: Payer: Self-pay | Admitting: Nurse Practitioner

## 2022-01-11 ENCOUNTER — Encounter: Payer: Self-pay | Admitting: Hematology

## 2022-01-11 ENCOUNTER — Inpatient Hospital Stay: Payer: Medicare Other

## 2022-01-11 ENCOUNTER — Other Ambulatory Visit (HOSPITAL_COMMUNITY): Payer: Self-pay

## 2022-01-11 ENCOUNTER — Ambulatory Visit: Payer: Medicare Other

## 2022-01-11 LAB — BPAM PLATELET PHERESIS
Blood Product Expiration Date: 202302032359
ISSUE DATE / TIME: 202302021142
Unit Type and Rh: 5100

## 2022-01-11 LAB — PREPARE PLATELET PHERESIS: Unit division: 0

## 2022-01-11 MED ORDER — HYDROCODONE-ACETAMINOPHEN 5-325 MG PO TABS
1.0000 | ORAL_TABLET | ORAL | 0 refills | Status: AC | PRN
Start: 2022-01-11 — End: ?
  Filled 2022-01-11 – 2022-01-22 (×2): qty 60, 10d supply, fill #0

## 2022-01-11 NOTE — Telephone Encounter (Signed)
New prescription for hydrocodone sent to [facility] outpatient pharmacy. Previously prescription [facility] all indicating to family and via fax that medication is on back order for all [facility] in the area. Family verbalized understanding and approved to be sent to our pharmacy with hopes of obtaining for the weekend.

## 2022-01-12 ENCOUNTER — Other Ambulatory Visit (HOSPITAL_COMMUNITY): Payer: Self-pay

## 2022-01-14 ENCOUNTER — Inpatient Hospital Stay: Payer: Medicare Other

## 2022-01-14 ENCOUNTER — Inpatient Hospital Stay (HOSPITAL_BASED_OUTPATIENT_CLINIC_OR_DEPARTMENT_OTHER): Payer: Medicare Other | Admitting: Nurse Practitioner

## 2022-01-14 ENCOUNTER — Other Ambulatory Visit: Payer: Self-pay

## 2022-01-14 ENCOUNTER — Encounter: Payer: Self-pay | Admitting: Nurse Practitioner

## 2022-01-14 VITALS — BP 129/90 | HR 130 | Temp 97.9°F | Resp 16 | Ht 62.0 in

## 2022-01-14 DIAGNOSIS — G893 Neoplasm related pain (acute) (chronic): Secondary | ICD-10-CM

## 2022-01-14 DIAGNOSIS — R531 Weakness: Secondary | ICD-10-CM

## 2022-01-14 DIAGNOSIS — D469 Myelodysplastic syndrome, unspecified: Secondary | ICD-10-CM

## 2022-01-14 DIAGNOSIS — R630 Anorexia: Secondary | ICD-10-CM

## 2022-01-14 DIAGNOSIS — I639 Cerebral infarction, unspecified: Secondary | ICD-10-CM | POA: Diagnosis not present

## 2022-01-14 DIAGNOSIS — Z5111 Encounter for antineoplastic chemotherapy: Secondary | ICD-10-CM | POA: Diagnosis not present

## 2022-01-14 DIAGNOSIS — Z515 Encounter for palliative care: Secondary | ICD-10-CM | POA: Diagnosis not present

## 2022-01-14 DIAGNOSIS — Z7189 Other specified counseling: Secondary | ICD-10-CM

## 2022-01-14 DIAGNOSIS — R634 Abnormal weight loss: Secondary | ICD-10-CM

## 2022-01-14 LAB — CMP (CANCER CENTER ONLY)
ALT: 23 U/L (ref 0–44)
AST: 16 U/L (ref 15–41)
Albumin: 3.3 g/dL — ABNORMAL LOW (ref 3.5–5.0)
Alkaline Phosphatase: 185 U/L — ABNORMAL HIGH (ref 38–126)
Anion gap: 12 (ref 5–15)
BUN: 14 mg/dL (ref 8–23)
CO2: 24 mmol/L (ref 22–32)
Calcium: 9 mg/dL (ref 8.9–10.3)
Chloride: 95 mmol/L — ABNORMAL LOW (ref 98–111)
Creatinine: 0.72 mg/dL (ref 0.61–1.24)
GFR, Estimated: >60 mL/min (ref >60)
Glucose, Bld: 268 mg/dL — ABNORMAL HIGH (ref 70–99)
Potassium: 3.8 mmol/L (ref 3.5–5.1)
Sodium: 131 mmol/L — ABNORMAL LOW (ref 135–145)
Total Bilirubin: 1.1 mg/dL (ref 0.3–1.2)
Total Protein: 6.1 g/dL — ABNORMAL LOW (ref 6.5–8.1)

## 2022-01-14 LAB — CBC WITH DIFFERENTIAL (CANCER CENTER ONLY)
Abs Immature Granulocytes: 0.4 10*3/uL — ABNORMAL HIGH (ref 0.00–0.07)
Band Neutrophils: 4 %
Basophils Absolute: 0 10*3/uL (ref 0.0–0.1)
Basophils Relative: 0 %
Blasts: 12 %
Eosinophils Absolute: 0 10*3/uL (ref 0.0–0.5)
Eosinophils Relative: 0 %
HCT: 25.9 % — ABNORMAL LOW (ref 39.0–52.0)
Hemoglobin: 9 g/dL — ABNORMAL LOW (ref 13.0–17.0)
Lymphocytes Relative: 42 %
Lymphs Abs: 1.1 10*3/uL (ref 0.7–4.0)
MCH: 29.1 pg (ref 26.0–34.0)
MCHC: 34.7 g/dL (ref 30.0–36.0)
MCV: 83.8 fL (ref 80.0–100.0)
Metamyelocytes Relative: 1 %
Monocytes Absolute: 0.3 10*3/uL (ref 0.1–1.0)
Monocytes Relative: 13 %
Myelocytes: 11 %
Neutro Abs: 0.5 10*3/uL — ABNORMAL LOW (ref 1.7–7.7)
Neutrophils Relative %: 15 %
Platelet Count: 16 10*3/uL — ABNORMAL LOW (ref 150–400)
Promyelocytes Relative: 2 %
RBC: 3.09 MIL/uL — ABNORMAL LOW (ref 4.22–5.81)
RDW: 13.4 % (ref 11.5–15.5)
WBC Count: 2.6 10*3/uL — ABNORMAL LOW (ref 4.0–10.5)
nRBC: 0 % (ref 0.0–0.2)

## 2022-01-14 LAB — SAMPLE TO BLOOD BANK

## 2022-01-14 MED ORDER — MORPHINE SULFATE ER 15 MG PO TBCR
15.0000 mg | EXTENDED_RELEASE_TABLET | Freq: Two times a day (BID) | ORAL | 0 refills | Status: DC
Start: 2022-01-14 — End: 2022-01-22

## 2022-01-14 MED ORDER — SODIUM CHLORIDE 0.9% IV SOLUTION
250.0000 mL | Freq: Once | INTRAVENOUS | Status: AC
  Administered 2022-01-14: 250 mL via INTRAVENOUS

## 2022-01-14 NOTE — Patient Instructions (Signed)
Transfusin de plaquetas Platelet Transfusion La transfusin de plaquetas es un procedimiento en el cual una persona recibe plaquetas de un donante a travs de una va intravenosa. Las plaquetas son fragmentos de la sangre que se unen y forman un cogulo para ayudar a que el cuerpo deje de Therapist, art despus de una lesin. Si tiene [employer], es posible que su sangre tenga problemas para Designer, fashion/clothing. Esto puede causar sangrado y moretones con mucha facilidad. Tal vez deba recibir una transfusin de plaquetas si tiene una enfermedad que reduce el nmero de plaquetas (trombocitopenia). Se puede usar una transfusin de plaquetas para detener o evitar el sangrado excesivo. Informe al mdico acerca de lo siguiente: Las reacciones que haya tenido durante transfusiones previas. Cualquier alergia que tenga. Todos los [employer] [place], incluidos vitaminas, hierbas, gotas oftlmicas, cremas y medicamentos de venta libre. Cualquier problema de la sangre que tenga. Cirugas a las que se haya sometido. Cualquier afeccin mdica que tenga. Si est embarazada o podra estarlo. Cules son los riesgos? En general, se trata de un procedimiento seguro. No obstante, pueden ocurrir complicaciones, por ejemplo: Cristy Hilts. Infeccin. Reaccin alrgica a las plaquetas donadas (de un donante). Que el sistema de su cuerpo que combate las enfermedades (sistema inmunitario) ataque las plaquetas del donante (reaccin hemoltica). Esto es poco frecuente. Una reaccin poco frecuente que causa dao pulmonar (lesin pulmonar aguda producida por transfusin). Qu ocurre antes del procedimiento? Medicamentos Consulte al mdico si debe hacer o no lo siguiente: Quarry manager o suspender los medicamentos que [place] habitualmente. Esto es muy importante si toma medicamentos para la diabetes o anticoagulantes. Tomar medicamentos como aspirina e ibuprofeno. Estos medicamentos pueden tener un efecto anticoagulante en la [place]. No tome  estos medicamentos a menos que el mdico se lo indique. Usar medicamentos de venta libre, vitaminas, hierbas y suplementos. Indicaciones generales Se le realizar un anlisis de sangre para determinar su grupo sanguneo. Su tipo de sangre determina qu tipo de plaquetas recibir. Siga las instrucciones del mdico respecto de las restricciones en las comidas o las bebidas. Si tuvo una reaccin alrgica a una transfusin en el pasado, tal vez le administren un medicamento que ayude a evitarla. Le controlarn la temperatura, la presin arterial, el pulso y la respiracin. Qu ocurre durante el procedimiento?  Le colocarn una va intravenosa (i.v.) en una vena. Por su seguridad, dos mdicos verificarn su identidad y las plaquetas del donante que se le van a infundir. Una bolsa con las plaquetas del donante se conectar a su va intravenosa. Las plaquetas ingresarn en su torrente sanguneo. Esto generalmente demora entre 30 y 52 minutos. Durante la transfusin, le controlarn la temperatura, la presin arterial, el pulso y la respiracin. Esto ayuda a detectar signos tempranos de una reaccin. Tambin lo controlarn para Hydrographic surveyor otros sntomas que puedan indicar una reaccin, como escalofros, urticaria o picazn. Si tiene signos de Freight forwarder, se suspender la transfusin y tal vez le administren un medicamento para Producer, television/film/video. Una vez finalizada la transfusin, se retirar la va intravenosa. Se puede aplicar presin en el lugar de la va intravenosa (i.v.) para detener cualquier sangrado. El lugar de la va intravenosa (i.v.) se cubrir con un vendaje (venda). El procedimiento puede variar segn el mdico y el hospital. Qu puedo esperar despus del procedimiento? Le controlarn la presin arterial, la temperatura, el pulso y la respiracin hasta que abandone el hospital o la clnica. Es posible que tenga algunos moretones y Information systems manager de la  va  intravenosa (i.v.). Siga estas indicaciones en su casa: Medicamentos Use los medicamentos de venta libre y los recetados solamente como se lo haya indicado el mdico. Hable con el mdico antes de tomar cualquier medicamento que contenga aspirina o antiinflamatorios no esteroideos (AINE), como el ibuprofeno. Estos medicamentos aumentan el riesgo de tener una hemorragia peligrosa. Cuidado del lugar de la va intravenosa (i.v.) Controle el lugar de la va intravenosa (i.v.) [employer] para descartar signos de infeccin. Est atento a los siguientes signos: Enrojecimiento, Land. Lquido o sangre. Si sale lquido o sangre del Environmental consultant de la va intravenosa (i.v.), ejerza presin firme con las manos sobre el vendaje que cubre la zona durante uno o dos minutos. Al hacerlo, debera detenerse el sangrado. Calor. Pus o mal olor. Indicaciones generales [place] o retire Secretary/administrator se lo haya indicado el mdico. Retome sus actividades normales como se lo haya indicado el mdico. Pregntele al mdico qu actividades son seguras para usted. No tome baos de inmersin, no nade ni use el jacuzzi hasta que el mdico lo autorice. Pregntele al mdico si puede ducharse. Concurra a [place]. Esto es importante. Comunquese con un mdico si: Siente un dolor de cabeza que no se alivia con medicamentos. Tiene ronchas, una erupcin cutnea o picazn en la piel. Tiene nuseas o vmitos. Se siente cansado o dbil de un modo que no es habitual. Tiene signos de infeccin en el lugar de la va intravenosa (i.v.). Solicite ayuda de inmediato si: Tiene fiebre o escalofros. Orina con menos frecuencia que lo habitual. La orina es de un color ms oscuro que lo normal. Tiene alguno de los siguientes sntomas: Dificultad para Ambulance person. Dolor en la espalda, el abdomen o el pecho. Piel fra y hmeda. Latidos cardacos acelerados. Resumen Las plaquetas son fragmentos diminutos de  clulas sanguneas que se agrupan para formar un cogulo de sangre cuando se produce una herida. Si tiene [employer], es posible que su sangre tenga problemas para Designer, fashion/clothing. La transfusin de plaquetas es un procedimiento en el cual se reciben plaquetas de un donante a travs de una va intravenosa. Se puede usar una transfusin de plaquetas para detener o evitar el sangrado excesivo. Despus del procedimiento, controle el lugar de la va intravenosa (i.v.) todos los das para descartar signos de infeccin. Esta informacin no tiene Marine scientist el consejo del mdico. Asegrese de hacerle al mdico cualquier pregunta que tenga. Document Revised: 06/27/2021 Document Reviewed: 06/27/2021 Elsevier Patient Education  [place].

## 2022-01-14 NOTE — Progress Notes (Signed)
I called [facility] and spoke with [place], the liason. I gave her Traylon Schimming information and explained Mr. Brawley situation. I gave her my office number to answer any questions. All questions/concerns addressed.

## 2022-01-14 NOTE — Progress Notes (Signed)
[facility]  [phone] Fax:(336) [phone]   Name: Mavin Dyke Date: 01/14/2022 MRN: [id number]  DOB: 28-Jan-1941  Patient Care Team: Emelia Loron, NP as PCP - General (Nurse Practitioner) Jettie Booze, MD as PCP - Cardiology (Cardiology) Truitt Merle, MD as Consulting Physician (Hematology)    REASON FOR CONSULTATION: Katelyn Kohlmeyer is a 81 y.o. male with medical history of MDS, diabetes, stroke, SIRS, hyperlipidemia, malnutrition, LBBB, gout, atrial fibrillation, and knee pain .  Palliative ask to see for symptom management and goals of care.    SOCIAL HISTORY:     reports that he has never smoked. He has never used smokeless tobacco. He reports that he does not currently use alcohol. He reports that he does not use drugs.  ADVANCE DIRECTIVES:  None on file, introduced conversations. Patient is aware in the absence of no directives all available children/spouse will be medical decision maker.   CODE STATUS: ongoing discussions  PAST MEDICAL HISTORY: Past Medical History:  Diagnosis Date   CKD (chronic kidney disease), stage III ([place])    Diabetes ([place])    Hyperlipemia    Hypertension    LBBB (left bundle branch block)    Stroke ([place])     PAST SURGICAL HISTORY:  Past Surgical History:  Procedure Laterality Date   BIOPSY  10/29/2021   Procedure: BIOPSY;  Surgeon: Thornton Park, MD;  Location: [facility];  Service: Gastroenterology;;   ESOPHAGOGASTRODUODENOSCOPY (EGD) WITH PROPOFOL N/A 10/29/2021   Procedure: ESOPHAGOGASTRODUODENOSCOPY (EGD) WITH PROPOFOL;  Surgeon: Thornton Park, MD;  Location: [facility];  Service: Gastroenterology;  Laterality: N/A;   IR US GUIDE BX ASP/DRAIN  01/01/2022    HEMATOLOGY/ONCOLOGY HISTORY:  Oncology History  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  MEDICATIONS:  Current Outpatient Medications  Medication Sig Dispense Refill   morphine (MS CONTIN) 15 MG 12 hr tablet Take 1 tablet (15 mg total) by mouth every 12 (twelve) hours. 60 tablet 0   Accu-Chek Softclix Lancets lancets USE UPTO FOUR TIMES DAILY AS DIRECTED     acetaminophen (TYLENOL) 325 MG tablet Take 1-2 tablets (325-650 mg total) by mouth every 4 (four) hours as needed for mild pain.     allopurinol (ZYLOPRIM) 300 MG tablet Take 1 tablet (300 mg total) by mouth daily. 30 tablet 5   atorvastatin (LIPITOR) 40 MG tablet Take 1 tablet (40 mg total) by mouth daily at 6 PM. 30 tablet 0   blood glucose meter kit and supplies KIT Dispense based on patient and insurance preference. Use up to four times daily as directed. (FOR ICD-9 250.00, 250.01). 1 each 0   colchicine 0.6 MG tablet Take 1 tablet (0.6 mg total) by mouth 2 (two) times daily. 30 tablet 0   diclofenac Sodium (VOLTAREN) 1 % GEL Apply 2 g topically 4 (four) times daily. (Patient taking differently: Apply 2 g topically 4 (four) times daily as needed (knee/ankle pain).) 150 g 1   DULoxetine (CYMBALTA) 20 MG capsule Take 1 capsule (20 mg total) by mouth 2 (two) times daily. 60 capsule 0   HYDROcodone-acetaminophen (NORCO) 5-325 MG tablet Take 1 tablet by mouth every 4 (four) hours as needed for moderate pain. 60 tablet 0   magnesium oxide (MAG-OX) 400 (  240 Mg) MG tablet Take 0.5 tablets (200 mg total) by mouth 2 (two) times daily. 10 tablet 0   metFORMIN (GLUCOPHAGE) 1000 MG tablet Take 1,000 mg by mouth 2 (two) times daily.     ondansetron (ZOFRAN) 8 MG tablet Take 1 tablet (8 mg total) by mouth 2 (two) times daily as needed (Nausea or vomiting). (Patient not taking: Reported on 01/01/2022) 30 tablet 1   pantoprazole (PROTONIX) 40 MG tablet Take 1 tablet (40 mg total) by mouth 2 (two) times daily. 30 tablet 1   No current  facility-administered medications for this visit.    VITAL SIGNS: BP 129/90 (BP Location: Left Arm, Patient Position: Sitting)    Pulse (!) 130 Comment: nurse notified   Temp 97.9 F (36.6 C) (Oral)    Resp 16    Ht _0  (1.575 m)    SpO2 100%    BMI 22.34 kg/m  Filed Weights    Estimated body mass index is 22.34 kg/m as calculated from the following:   Height as of this encounter: _1  (1.575 m).   Weight as of 01/01/22: 122 lb 2.2 oz (55.4 kg).  PERFORMANCE STATUS (ECOG) : 3 - Symptomatic, >50% confined to bed  Physical Exam General: NAD, weak, ill-appearing Cardiovascular: regular rate and rhythm Pulmonary: diminished bilaterally  Abdomen: soft, nontender, + bowel sounds Extremities: no edema, no joint deformities Skin: no rashes Neurological: Weakness but otherwise nonfocal, in a wheelchair, AAO x4 with Spanish Interpretor   IMPRESSION:  Mr. Oboyle presents to the clinic today for follow-up and further goals of care discussion. His daughter, Joaquim Lai and son, Jacqulyn Bath is present as well as interpretor, Almyra Free. He is sitting in a wheelchair. Weak appearing. He is scheduled for a platelet transfusion today. Platelets 16.   His wife returned to [place] over the weekend.   Bilateral uncontrolled knee pain Mr. Pellicane continues to experience knee pain as well as now some generalized pain. He was not able to obtain his Norco until Saturday due to multiple pharmacies being out of stock. We were eventually able to get it sent in to [facility] where his granddaughter picked up. His pain is decreased with use but is not completely resolved. He is tolerating Cymbalta. Education provided on MS Contin ER for added support.   Decreased appetite/weight loss  Appetite remains minimal. Family continues to offer Ensure for additional protein support.    Goals of care  I discussed at length with family patient's disease trajectory. Joaquim Lai has spoken with Dr. Burr Medico and understand  based on recent findings additional treatment options are not available including bone marrow transplant. All questions answered to the best of my ability.   Family is tearful expressing their father's rapid decline. They share their awareness of Mr. Clayburn increased weakness, fatigue, weight loss, and lack of interest in things he once enjoyed. Emotional support provided. They shared that their father has been expressing his goodbyes with family. He recently thanked them for all the care and love each of them have shown and provided to him.   Recommendations for outpatient hospice provided. Detailed education provided on hospice services in home and residential hospice facility. Children verbalized understanding and appreciation. They verbalized request for referral to be placed. Patient will plan to stay with his daughter, Joaquim Lai in [place].   Education provided on expectations at end-of-life and aggressive symptom management support. Jose expressed wishes to honor his father with hopes they can return to [place] prior to his  passing. I acknowledge their request however with awareness that given patient's frail state and length of travel and flight medically would recommend against due to high risk of sudden death or health decline. Family verbalized understanding however are relying on God with hopes he can sustain the trip and make it. They would are going to begin planning with anticipation of transporting Mr. Appelt to [place] by Friday and fly out as this would decrease length of flight making it only 4 hours.   They would like to continue with scheduled transfusion on Thursday to provide additional support during his travel.   I created space and opportunity to discuss patient's full code status with consideration of his current illness and plans to focus solely on his comfort for what time he has left. Joaquim Lai and Ladora were emotional but expressed understanding and agreement.  Education provided on what DNR/DNI would look like. They verbalized understanding confirming wishes.   Education provided on MOST form which was completed as requested. The patient and family outlined their wishes for the following treatment decisions:  Cardiopulmonary Resuscitation: Do Not Attempt Resuscitation (DNR/No CPR)  Medical Interventions: Comfort Measures: Keep clean, warm, and dry. Use medication by any route, positioning, wound care, and other measures to relieve pain and suffering. Use oxygen, suction and manual treatment of airway obstruction as needed for comfort. Do not transfer to the hospital unless comfort needs cannot be met in current location.  Antibiotics: Determine use of limitation of antibiotics when infection occurs  IV Fluids: No IV fluids (provide other measures to ensure comfort)  Feeding Tube: No feeding tube     PLAN: Norco 5/376m every 4 hours as needed for pain Cymbalta 40 mg daily  MS Contin 15 mg every 12 hours DNR/DNI, MOST form completed (see above), original and copies given to family.  [place] for [place]'s daughter given his poor prognosis.  Extensive goals of care discussion regarding terminal illness. Family expressed understanding with plans to transport [place]. They acknowledge patient is at high risk of sudden death and decline however wishes to take their chances and follow his wishes.  Patient to receive platelet transfusion today and return on Thur for lab and possible transfusion per Dr. FBurr Medico Family anticipating patient leaving for his home on Friday.  [place] [facility].  I will plan to follow-up with patient and family when he returns Thursday for ongoing support.    Family expressed understanding and was in agreement with this plan. He  also understands that He can call the clinic at any time with any questions, concerns, or complaints.   Time Total: 65 min.   Visit consisted of counseling and education dealing with the complex and emotionally intense issues of symptom management and palliative care in the setting of serious and potentially life-threatening illness.Greater than 50%  of this time was spent counseling and coordinating care related to the above assessment and plan.   Signed by: NAlda Lea AGPCNP-BC Palliative Medicine Team

## 2022-01-14 NOTE — Progress Notes (Signed)
[facility] confirmed plt orders for transfusion today.

## 2022-01-15 LAB — PREPARE PLATELET PHERESIS: Unit division: 0

## 2022-01-15 LAB — BPAM PLATELET PHERESIS
Blood Product Expiration Date: 202302092359
ISSUE DATE / TIME: 202302061111
Unit Type and Rh: 5100

## 2022-01-17 ENCOUNTER — Inpatient Hospital Stay: Payer: Medicare Other | Admitting: Dietician

## 2022-01-17 ENCOUNTER — Inpatient Hospital Stay: Payer: Medicare Other

## 2022-01-18 ENCOUNTER — Other Ambulatory Visit: Payer: Self-pay

## 2022-01-18 NOTE — Progress Notes (Unsigned)
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 Last seen by Dr. Burr Medico 01/07/22 and completed another cycle of Vidaza. He continues f/up with palliative care team [facility]. At his last visit 01/14/22 he accepted hospice referral.    REVIEW OF SYSTEMS:   Constitutional: Denies fevers, chills or abnormal weight loss Eyes: Denies blurriness of vision Ears, nose, mouth, throat, and face: Denies mucositis or sore throat Respiratory: Denies cough, dyspnea or wheezes Cardiovascular: Denies palpitation, chest discomfort or lower extremity swelling Gastrointestinal:  Denies nausea, heartburn or change in bowel habits Skin: Denies abnormal skin rashes Lymphatics: Denies new lymphadenopathy or easy bruising Neurological:Denies numbness, tingling or new weaknesses Behavioral/Psych: Mood is stable, no new changes  All other systems were reviewed with the patient and are negative.  MEDICAL HISTORY:  Past Medical History:  Diagnosis Date   CKD (chronic kidney disease), stage III ([place])    Diabetes ([place])    Hyperlipemia    Hypertension    LBBB (left bundle branch  block)    Stroke [facility])     SURGICAL HISTORY: Past Surgical History:  Procedure Laterality Date   BIOPSY  10/29/2021   Procedure: BIOPSY;  Surgeon: Thornton Park, MD;  Location: [facility];  Service: Gastroenterology;;   ESOPHAGOGASTRODUODENOSCOPY (EGD) WITH PROPOFOL N/A 10/29/2021   Procedure: ESOPHAGOGASTRODUODENOSCOPY (EGD) WITH PROPOFOL;  Surgeon: Thornton Park, MD;  Location: [facility];  Service: Gastroenterology;  Laterality: N/A;   IR US GUIDE BX ASP/DRAIN  01/01/2022    I have reviewed the social history and family history with the patient and they are unchanged from previous note.  ALLERGIES:  has No Known Allergies.  MEDICATIONS:  Current Outpatient Medications  Medication Sig Dispense Refill   Accu-Chek Softclix Lancets lancets USE UPTO FOUR TIMES DAILY AS DIRECTED     acetaminophen (TYLENOL) 325 MG tablet Take 1-2 tablets (325-650 mg total) by mouth every 4 (four) hours as needed for mild pain.     allopurinol (ZYLOPRIM) 300 MG tablet Take 1 tablet (300 mg total) by mouth daily. 30 tablet 5   atorvastatin (LIPITOR) 40 MG tablet Take 1 tablet (40 mg total) by mouth daily at 6 PM. 30 tablet 0   blood glucose meter kit and supplies KIT Dispense based on patient and insurance preference. Use up to four times daily as directed. (FOR ICD-9 250.00, 250.01). 1 each 0   colchicine 0.6 MG tablet Take 1 tablet (0.6 mg total) by mouth 2 (two) times daily. 30 tablet 0   diclofenac Sodium (VOLTAREN) 1 % GEL Apply 2 g topically 4 (four) times daily. (Patient taking differently: Apply 2 g topically 4 (four) times daily as needed (knee/ankle pain).) 150  g 1   DULoxetine (CYMBALTA) 20 MG capsule Take 1 capsule (20 mg total) by mouth 2 (two) times daily. 60 capsule 0   HYDROcodone-acetaminophen (NORCO) 5-325 MG tablet Take 1 tablet by mouth every 4 (four) hours as needed for moderate pain. 60 tablet 0   magnesium oxide (MAG-OX) 400 (240 Mg) MG tablet Take 0.5 tablets (200 mg total)  by mouth 2 (two) times daily. 10 tablet 0   metFORMIN (GLUCOPHAGE) 1000 MG tablet Take 1,000 mg by mouth 2 (two) times daily.     morphine (MS CONTIN) 15 MG 12 hr tablet Take 1 tablet (15 mg total) by mouth every 12 (twelve) hours. 60 tablet 0   ondansetron (ZOFRAN) 8 MG tablet Take 1 tablet (8 mg total) by mouth 2 (two) times daily as needed (Nausea or vomiting). (Patient not taking: Reported on 01/01/2022) 30 tablet 1   pantoprazole (PROTONIX) 40 MG tablet Take 1 tablet (40 mg total) by mouth 2 (two) times daily. 30 tablet 1   No current facility-administered medications for this visit.    PHYSICAL EXAMINATION: ECOG PERFORMANCE STATUS: {CHL ONC ECOG PS:[id number]}  There were no vitals filed for this visit. There were no vitals filed for this visit.  GENERAL:alert, no distress and comfortable SKIN: skin color, texture, turgor are normal, no rashes or significant lesions EYES: normal, Conjunctiva are pink and non-injected, sclera clear OROPHARYNX:no exudate, no erythema and lips, buccal mucosa, and tongue normal  NECK: supple, thyroid normal size, non-tender, without nodularity LYMPH:  no palpable lymphadenopathy in the cervical, axillary or inguinal LUNGS: clear to auscultation and percussion with normal breathing effort HEART: regular rate & rhythm and no murmurs and no lower extremity edema ABDOMEN:abdomen soft, non-tender and normal bowel sounds Musculoskeletal:no cyanosis of digits and no clubbing  NEURO: alert & oriented x 3 with fluent speech, no focal motor/sensory deficits  LABORATORY DATA:  I have reviewed the data as listed CBC Latest Ref Rng & Units 01/14/2022 01/10/2022 01/07/2022  WBC 4.0 - 10.5 K/uL 2.6(L) 2.0(L) 2.8(L)  Hemoglobin 13.0 - 17.0 g/dL 9.0(L) 9.7(L) 8.0(L)  Hematocrit 39.0 - 52.0 % 25.9(L) 28.5(L) 22.7(L)  Platelets 150 - 400 K/uL 16(L) 7(LL) 25(L)     CMP Latest Ref Rng & Units 01/14/2022 01/10/2022 01/04/2022  Glucose 70 - 99 mg/dL 268(H) 213(H) 164(H)  BUN  8 - 23 mg/dL 14 18 16   Creatinine 0.61 - 1.24 mg/dL 0.72 0.75 0.61  Sodium 135 - 145 mmol/L 131(L) 132(L) 133(L)  Potassium 3.5 - 5.1 mmol/L 3.8 4.2 4.0  Chloride 98 - 111 mmol/L 95(L) 97(L) 98  CO2 22 - 32 mmol/L 24 27 28   Calcium 8.9 - 10.3 mg/dL 9.0 9.1 8.2(L)  Total Protein 6.5 - 8.1 g/dL 6.1(L) 6.1(L) -  Total Bilirubin 0.3 - 1.2 mg/dL 1.1 0.9 -  Alkaline Phos 38 - 126 U/L 185(H) 234(H) -  AST 15 - 41 U/L 16 17 -  ALT 0 - 44 U/L 23 40 -      RADIOGRAPHIC STUDIES: I have personally reviewed the radiological images as listed and agreed with the findings in the report. No results found.   ASSESSMENT & PLAN:  No problem-specific Assessment & Plan notes found for this encounter.   No orders of the defined types were placed in this encounter.  All questions were answered. The patient knows to call the clinic with any problems, questions or concerns. No barriers to learning was detected. I spent {CHL ONC TIME VISIT - MVEHM:[id number]} counseling the patient  face to face. The total time spent in the appointment was {CHL ONC TIME VISIT - DKCCQ:[id number]} and more than 50% was on counseling and review of test results     Alla Feeling, NP 01/18/22

## 2022-01-18 NOTE — Progress Notes (Signed)
Per Dr. Burr Medico and Jobe Gibbon, NP (Palliative Care), pt is going back to [place] after being placed on hospice.  Pt will leave this weekend.  Canceled pt's appts in [place] per Dr. Burr Medico and Chesley Noon, NP request.

## 2022-01-21 ENCOUNTER — Other Ambulatory Visit (HOSPITAL_COMMUNITY): Payer: Self-pay

## 2022-01-21 ENCOUNTER — Inpatient Hospital Stay: Payer: Medicare Other | Admitting: Nurse Practitioner

## 2022-01-21 ENCOUNTER — Inpatient Hospital Stay: Payer: Medicare Other

## 2022-01-22 ENCOUNTER — Other Ambulatory Visit (HOSPITAL_COMMUNITY): Payer: Self-pay

## 2022-01-22 ENCOUNTER — Other Ambulatory Visit: Payer: Self-pay | Admitting: Nurse Practitioner

## 2022-01-22 MED ORDER — MORPHINE SULFATE ER 15 MG PO TBCR: 15.0000 mg | EXTENDED_RELEASE_TABLET | Freq: Three times a day (TID) | ORAL | 0 refills | Status: AC

## 2022-01-24 ENCOUNTER — Other Ambulatory Visit: Payer: Self-pay

## 2022-01-24 ENCOUNTER — Inpatient Hospital Stay: Payer: Medicare Other

## 2022-01-28 ENCOUNTER — Inpatient Hospital Stay: Payer: Medicare Other

## 2022-01-31 ENCOUNTER — Inpatient Hospital Stay: Payer: Medicare Other

## 2022-02-11 ENCOUNTER — Ambulatory Visit: Payer: Medicare Other | Admitting: Emergency Medicine

## 2022-02-11 ENCOUNTER — Encounter: Payer: Self-pay | Admitting: Emergency Medicine

## 2022-02-17 IMAGING — US IR US GUIDANCE
1 series · 7 of 7 positions shown · non-contrast
Comparison: none

CLINICAL DATA: Status post attempted and failed right knee joint
aspiration at [HOSPITAL]. Right knee pain, suprapatellar bursal
effusion seen on previous x-ray. Request for aspiration of the
effusion.

[Series 1: ir (id) (id)/(id)/(id) ir · 7 of 7 slices shown]
[im 1/7]
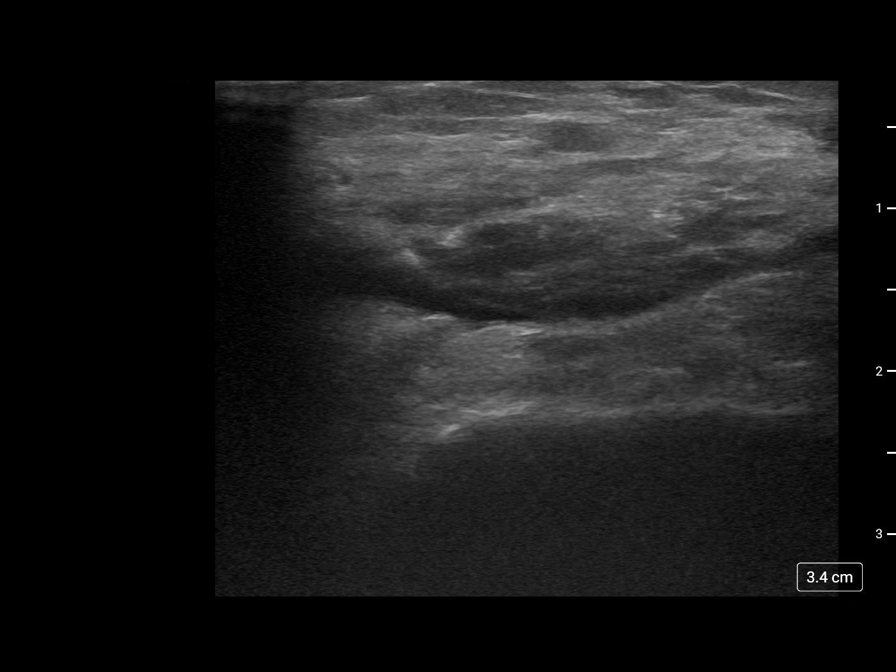
[im 2/7]
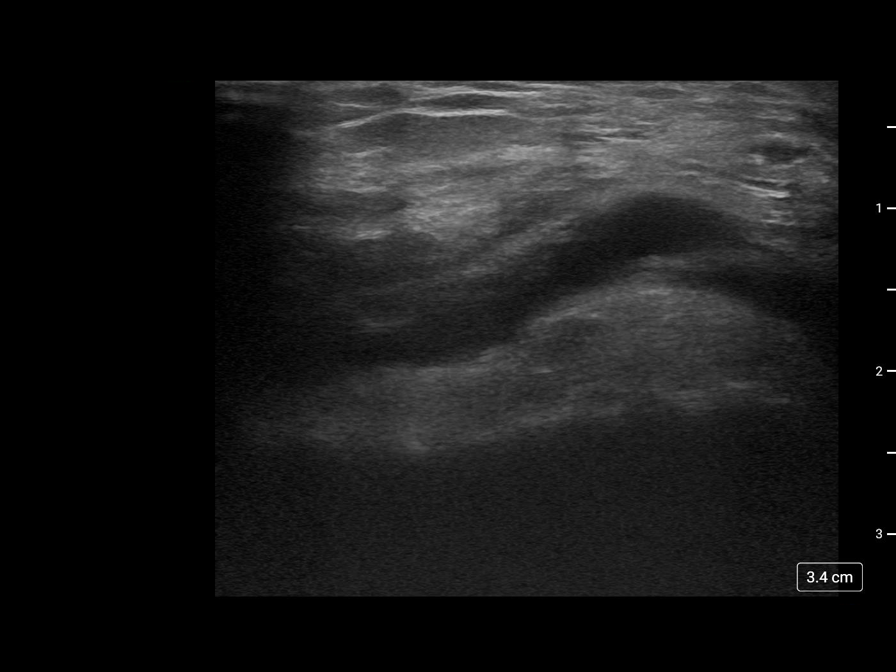
[im 3/7]
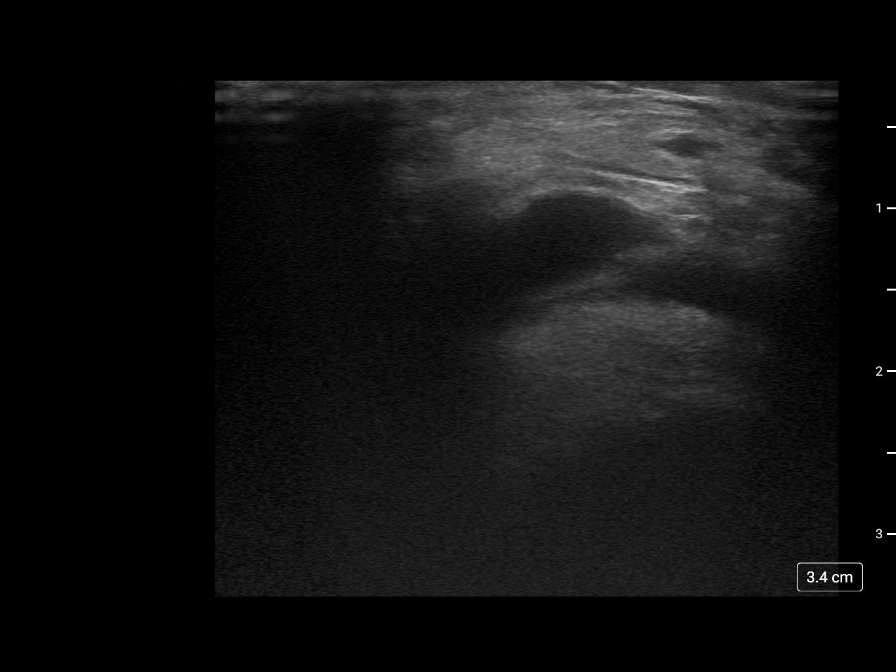
[im 4/7]
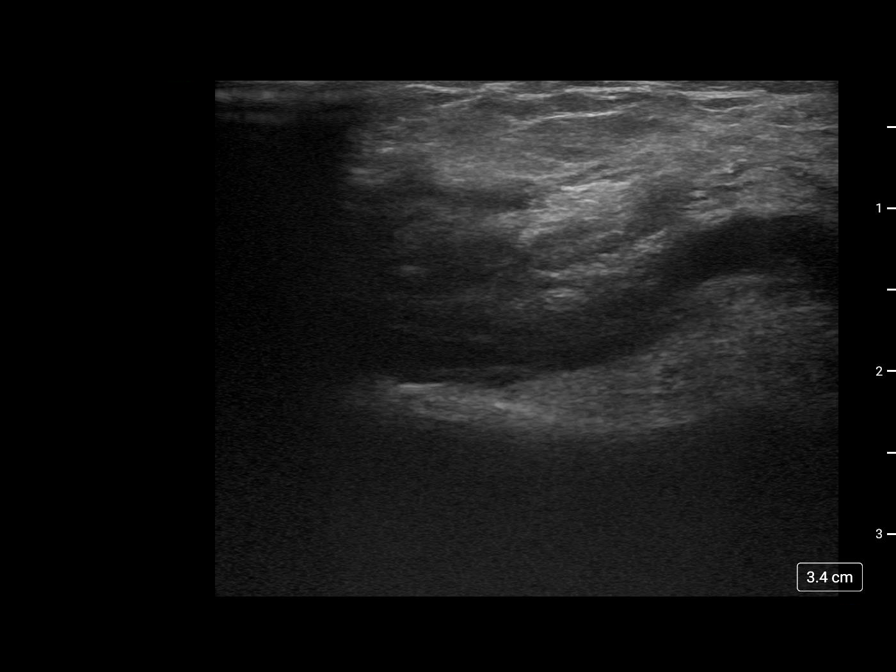
[im 5/7]
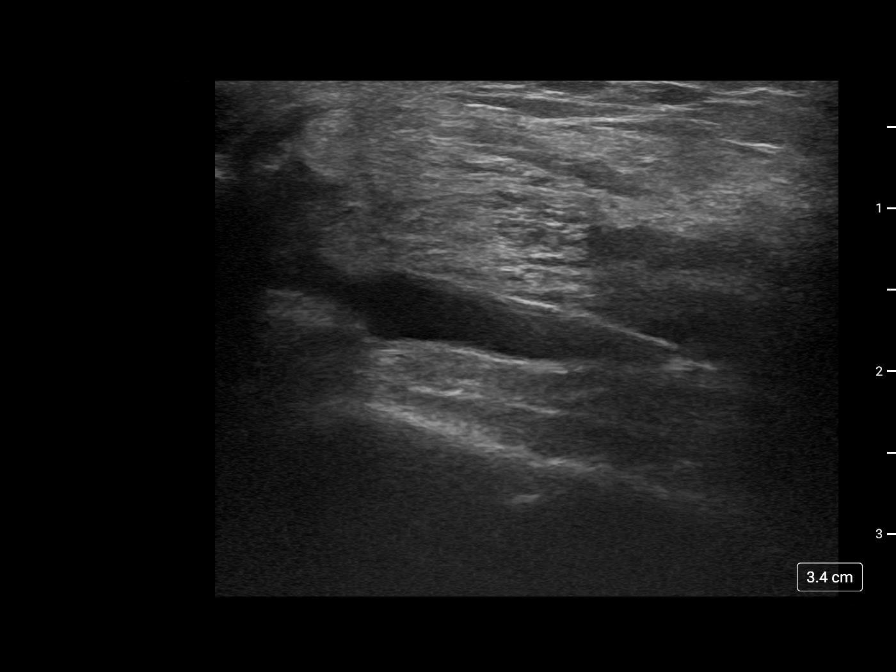
[im 6/7]
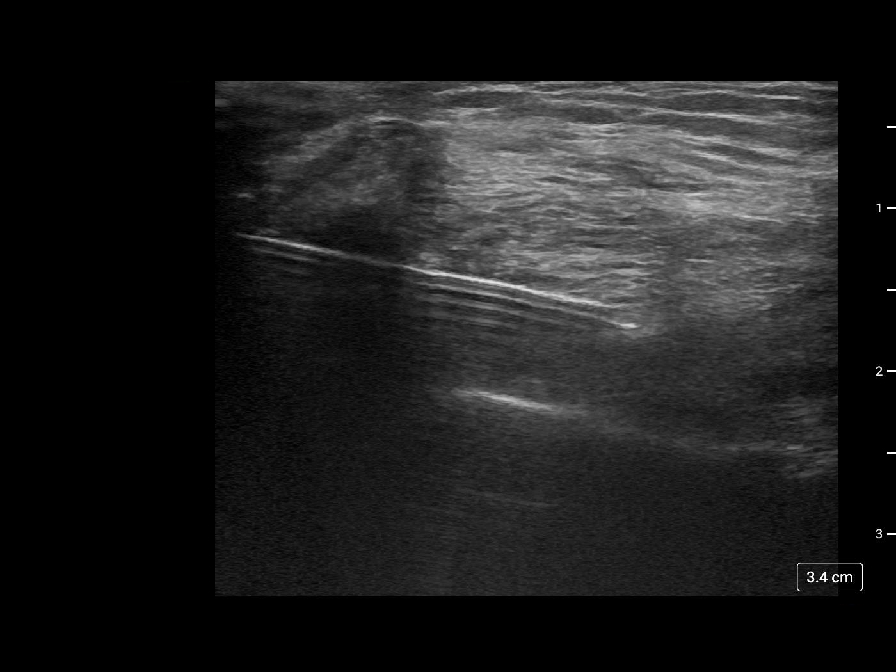
[im 7/7]
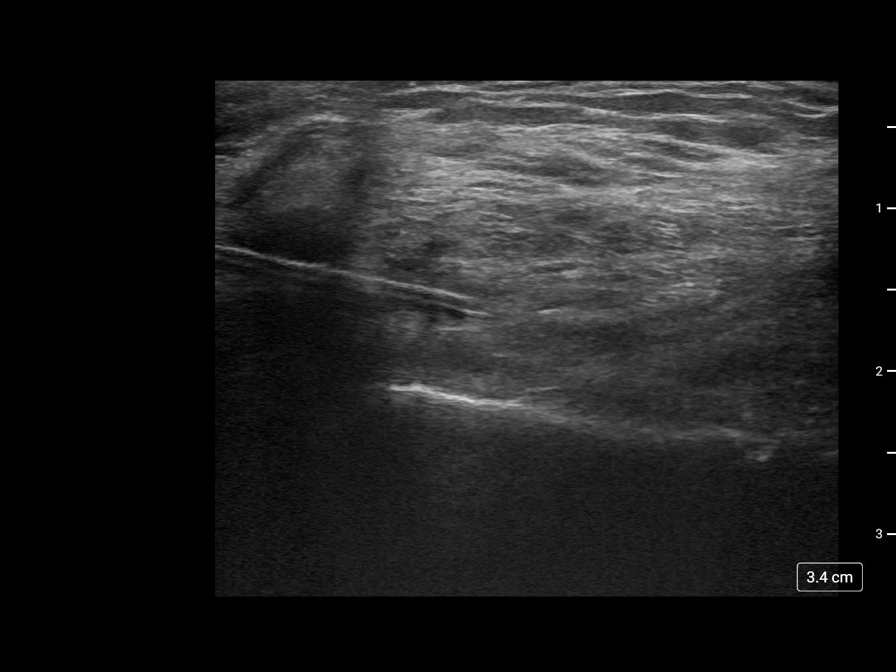

[7 of 7 positions shown; findings below may reference images not displayed]

EXAM:
ULTRASOUND GUIDED RIGHT KNEE JOINT ASPIRATION

PROCEDURE:
After a thorough discussion of risks and benefits of the procedure
including the general risks of bleeding, infection, damage to
adjacent structures, written consent was obtained. Specific risks of
this procedure included false positive and false negative results
and introduction of infection into the joint. Time-out form was
completed when appropriate.

The right knee was examined and scanned by ultrasound. The skin was
prepped and draped in the usual sterile fashion with Chloraprep.
Local anesthesia and deep anesthesia was provided with 1% lidocaine
without epinephrine. Careful attention was paid not to inject
bacteriostatic lidocaine into the joint. Under direct ultrasound
guidance, a 20 gauge spinal needle was advanced into the
suprapatellar space of the right knee joint from a lateral approach.
Aspiration was performed which yielded 8 mL of serosanguinous fluid.
Fluid was sent for requested lab analysis. The needle was removed
and a dressing was placed.
IMPRESSION: Technically successful right knee aspiration at the level of a
suprapatellar joint effusion yielding 8 mL of serosanguinous fluid.

## 2022-02-19 ENCOUNTER — Ambulatory Visit: Payer: Medicare Other | Admitting: Internal Medicine

## 2022-03-09 DEATH — deceased

## 2022-04-01 ENCOUNTER — Encounter: Payer: Self-pay | Admitting: Hematology
